# Patient Record
Sex: Male | Born: 1950
Health system: Southern US, Community
[De-identification: ages and names within clinical notes are randomized; demographics above are authoritative.]

## PROBLEM LIST (undated history)

## (undated) DIAGNOSIS — I10 Essential (primary) hypertension: Secondary | ICD-10-CM

## (undated) DIAGNOSIS — IMO0002 Reserved for concepts with insufficient information to code with codable children: Secondary | ICD-10-CM

## (undated) DIAGNOSIS — T8859XA Other complications of anesthesia, initial encounter: Secondary | ICD-10-CM

## (undated) DIAGNOSIS — T4145XA Adverse effect of unspecified anesthetic, initial encounter: Secondary | ICD-10-CM

## (undated) DIAGNOSIS — B20 Human immunodeficiency virus [HIV] disease: Secondary | ICD-10-CM

## (undated) DIAGNOSIS — M109 Gout, unspecified: Secondary | ICD-10-CM

## (undated) DIAGNOSIS — Z862 Personal history of diseases of the blood and blood-forming organs and certain disorders involving the immune mechanism: Secondary | ICD-10-CM

## (undated) DIAGNOSIS — E1165 Type 2 diabetes mellitus with hyperglycemia: Secondary | ICD-10-CM

## (undated) HISTORY — DX: Other disorders of bilirubin metabolism: E80.6

## (undated) HISTORY — PX: OTHER SURGICAL HISTORY: SHX169

## (undated) HISTORY — PX: ADRENALECTOMY: SHX876

## (undated) HISTORY — DX: Type 2 diabetes mellitus with hyperglycemia: E11.65

## (undated) HISTORY — DX: Essential (primary) hypertension: I10

## (undated) HISTORY — DX: Human immunodeficiency virus (HIV) disease: B20

## (undated) HISTORY — DX: Gout, unspecified: M10.9

## (undated) HISTORY — DX: Personal history of diseases of the blood and blood-forming organs and certain disorders involving the immune mechanism: Z86.2

## (undated) HISTORY — PX: CHOLECYSTECTOMY: SHX55

---

## 1988-03-10 DIAGNOSIS — B20 Human immunodeficiency virus [HIV] disease: Secondary | ICD-10-CM

## 1988-03-10 HISTORY — DX: Human immunodeficiency virus (HIV) disease: B20

## 1989-02-07 ENCOUNTER — Encounter (INDEPENDENT_AMBULATORY_CARE_PROVIDER_SITE_OTHER): Payer: Self-pay | Admitting: *Deleted

## 1989-02-07 LAB — CONVERTED CEMR LAB
CD4 Count: 530 microliters
CD4 T Cell Abs: 530

## 1997-06-20 ENCOUNTER — Encounter: Admission: RE | Admit: 1997-06-20 | Discharge: 1997-06-20 | Payer: Self-pay | Admitting: Infectious Diseases

## 1997-07-12 ENCOUNTER — Encounter: Admission: RE | Admit: 1997-07-12 | Discharge: 1997-07-12 | Payer: Self-pay | Admitting: Infectious Diseases

## 1998-03-12 ENCOUNTER — Encounter: Admission: RE | Admit: 1998-03-12 | Discharge: 1998-03-12 | Payer: Self-pay | Admitting: Infectious Diseases

## 1998-04-16 ENCOUNTER — Encounter: Admission: RE | Admit: 1998-04-16 | Discharge: 1998-04-16 | Payer: Self-pay | Admitting: Infectious Diseases

## 1998-07-18 ENCOUNTER — Encounter: Admission: RE | Admit: 1998-07-18 | Discharge: 1998-07-18 | Payer: Self-pay | Admitting: Infectious Diseases

## 1998-07-18 ENCOUNTER — Ambulatory Visit (HOSPITAL_COMMUNITY): Admission: RE | Admit: 1998-07-18 | Discharge: 1998-07-18 | Payer: Self-pay | Admitting: Infectious Diseases

## 1998-08-06 ENCOUNTER — Encounter: Admission: RE | Admit: 1998-08-06 | Discharge: 1998-08-06 | Payer: Self-pay | Admitting: Infectious Diseases

## 1998-08-10 ENCOUNTER — Encounter (HOSPITAL_COMMUNITY): Admission: RE | Admit: 1998-08-10 | Discharge: 1998-11-08 | Payer: Self-pay | Admitting: Gastroenterology

## 1998-10-12 ENCOUNTER — Ambulatory Visit (HOSPITAL_COMMUNITY): Admission: RE | Admit: 1998-10-12 | Discharge: 1998-10-12 | Payer: Self-pay | Admitting: Infectious Diseases

## 1998-10-12 ENCOUNTER — Encounter: Admission: RE | Admit: 1998-10-12 | Discharge: 1998-10-12 | Payer: Self-pay | Admitting: Infectious Diseases

## 1998-10-29 ENCOUNTER — Encounter: Admission: RE | Admit: 1998-10-29 | Discharge: 1998-10-29 | Payer: Self-pay | Admitting: Infectious Diseases

## 1998-11-08 ENCOUNTER — Encounter (HOSPITAL_COMMUNITY): Admission: RE | Admit: 1998-11-08 | Discharge: 1999-02-06 | Payer: Self-pay | Admitting: Gastroenterology

## 1999-02-27 ENCOUNTER — Ambulatory Visit (HOSPITAL_COMMUNITY): Admission: RE | Admit: 1999-02-27 | Discharge: 1999-02-27 | Payer: Self-pay | Admitting: Infectious Diseases

## 1999-02-27 ENCOUNTER — Encounter: Admission: RE | Admit: 1999-02-27 | Discharge: 1999-02-27 | Payer: Self-pay | Admitting: Infectious Diseases

## 1999-03-18 ENCOUNTER — Encounter: Admission: RE | Admit: 1999-03-18 | Discharge: 1999-03-18 | Payer: Self-pay | Admitting: Infectious Diseases

## 1999-06-11 ENCOUNTER — Ambulatory Visit (HOSPITAL_COMMUNITY): Admission: RE | Admit: 1999-06-11 | Discharge: 1999-06-11 | Payer: Self-pay | Admitting: Gastroenterology

## 1999-08-16 ENCOUNTER — Ambulatory Visit (HOSPITAL_COMMUNITY): Admission: RE | Admit: 1999-08-16 | Discharge: 1999-08-16 | Payer: Self-pay | Admitting: Infectious Diseases

## 1999-08-16 ENCOUNTER — Encounter (HOSPITAL_COMMUNITY): Admission: RE | Admit: 1999-08-16 | Discharge: 1999-11-14 | Payer: Self-pay | Admitting: Gastroenterology

## 1999-09-30 ENCOUNTER — Encounter: Admission: RE | Admit: 1999-09-30 | Discharge: 1999-09-30 | Payer: Self-pay | Admitting: Infectious Diseases

## 2000-01-17 ENCOUNTER — Encounter (HOSPITAL_COMMUNITY): Admission: RE | Admit: 2000-01-17 | Discharge: 2000-04-16 | Payer: Self-pay | Admitting: Gastroenterology

## 2000-01-24 ENCOUNTER — Emergency Department (HOSPITAL_COMMUNITY): Admission: EM | Admit: 2000-01-24 | Discharge: 2000-01-24 | Payer: Self-pay | Admitting: Emergency Medicine

## 2000-03-30 ENCOUNTER — Encounter: Admission: RE | Admit: 2000-03-30 | Discharge: 2000-03-30 | Payer: Self-pay | Admitting: Infectious Diseases

## 2000-03-30 ENCOUNTER — Ambulatory Visit (HOSPITAL_COMMUNITY): Admission: RE | Admit: 2000-03-30 | Discharge: 2000-03-30 | Payer: Self-pay | Admitting: Infectious Diseases

## 2000-04-17 ENCOUNTER — Encounter (HOSPITAL_COMMUNITY): Admission: RE | Admit: 2000-04-17 | Discharge: 2000-07-16 | Payer: Self-pay | Admitting: Gastroenterology

## 2000-04-20 ENCOUNTER — Encounter: Admission: RE | Admit: 2000-04-20 | Discharge: 2000-04-20 | Payer: Self-pay | Admitting: Infectious Diseases

## 2000-08-28 ENCOUNTER — Encounter (HOSPITAL_COMMUNITY): Admission: RE | Admit: 2000-08-28 | Discharge: 2000-11-26 | Payer: Self-pay | Admitting: Gastroenterology

## 2001-01-27 ENCOUNTER — Encounter (HOSPITAL_COMMUNITY): Admission: RE | Admit: 2001-01-27 | Discharge: 2001-04-27 | Payer: Self-pay | Admitting: Gastroenterology

## 2001-01-27 ENCOUNTER — Ambulatory Visit (HOSPITAL_COMMUNITY): Admission: RE | Admit: 2001-01-27 | Discharge: 2001-01-27 | Payer: Self-pay | Admitting: Infectious Diseases

## 2001-01-27 ENCOUNTER — Encounter: Admission: RE | Admit: 2001-01-27 | Discharge: 2001-01-27 | Payer: Self-pay | Admitting: Infectious Diseases

## 2001-02-22 ENCOUNTER — Encounter: Admission: RE | Admit: 2001-02-22 | Discharge: 2001-02-22 | Payer: Self-pay | Admitting: Infectious Diseases

## 2001-03-22 ENCOUNTER — Encounter: Admission: RE | Admit: 2001-03-22 | Discharge: 2001-03-22 | Payer: Self-pay | Admitting: Internal Medicine

## 2001-04-27 ENCOUNTER — Ambulatory Visit (HOSPITAL_COMMUNITY): Admission: RE | Admit: 2001-04-27 | Discharge: 2001-04-27 | Payer: Self-pay | Admitting: Infectious Diseases

## 2001-04-27 ENCOUNTER — Encounter: Admission: RE | Admit: 2001-04-27 | Discharge: 2001-04-27 | Payer: Self-pay | Admitting: Internal Medicine

## 2001-05-11 ENCOUNTER — Encounter: Admission: RE | Admit: 2001-05-11 | Discharge: 2001-05-11 | Payer: Self-pay | Admitting: Infectious Diseases

## 2001-07-02 ENCOUNTER — Encounter (HOSPITAL_COMMUNITY): Admission: RE | Admit: 2001-07-02 | Discharge: 2001-09-30 | Payer: Self-pay | Admitting: Gastroenterology

## 2001-10-08 ENCOUNTER — Encounter (HOSPITAL_COMMUNITY): Admission: RE | Admit: 2001-10-08 | Discharge: 2002-01-06 | Payer: Self-pay | Admitting: Gastroenterology

## 2001-11-01 ENCOUNTER — Encounter: Admission: RE | Admit: 2001-11-01 | Discharge: 2001-11-01 | Payer: Self-pay | Admitting: Infectious Diseases

## 2001-11-01 ENCOUNTER — Ambulatory Visit (HOSPITAL_COMMUNITY): Admission: RE | Admit: 2001-11-01 | Discharge: 2001-11-01 | Payer: Self-pay | Admitting: Infectious Diseases

## 2001-12-13 ENCOUNTER — Encounter: Admission: RE | Admit: 2001-12-13 | Discharge: 2001-12-13 | Payer: Self-pay | Admitting: Infectious Diseases

## 2002-01-07 ENCOUNTER — Encounter (HOSPITAL_COMMUNITY): Admission: RE | Admit: 2002-01-07 | Discharge: 2002-04-07 | Payer: Self-pay | Admitting: Gastroenterology

## 2002-03-10 LAB — HM COLONOSCOPY: HM Colonoscopy: NORMAL

## 2002-03-24 ENCOUNTER — Ambulatory Visit (HOSPITAL_COMMUNITY): Admission: RE | Admit: 2002-03-24 | Discharge: 2002-03-24 | Payer: Self-pay | Admitting: Infectious Diseases

## 2002-03-24 ENCOUNTER — Encounter: Admission: RE | Admit: 2002-03-24 | Discharge: 2002-03-24 | Payer: Self-pay | Admitting: Internal Medicine

## 2002-04-11 ENCOUNTER — Encounter: Admission: RE | Admit: 2002-04-11 | Discharge: 2002-04-11 | Payer: Self-pay | Admitting: Infectious Diseases

## 2002-04-11 ENCOUNTER — Ambulatory Visit (HOSPITAL_COMMUNITY): Admission: RE | Admit: 2002-04-11 | Discharge: 2002-04-11 | Payer: Self-pay | Admitting: Infectious Diseases

## 2002-05-09 ENCOUNTER — Encounter: Admission: RE | Admit: 2002-05-09 | Discharge: 2002-05-09 | Payer: Self-pay | Admitting: Infectious Diseases

## 2002-05-13 ENCOUNTER — Encounter (HOSPITAL_COMMUNITY): Admission: RE | Admit: 2002-05-13 | Discharge: 2002-08-11 | Payer: Self-pay | Admitting: Gastroenterology

## 2002-07-20 ENCOUNTER — Encounter: Payer: Self-pay | Admitting: Emergency Medicine

## 2002-07-20 ENCOUNTER — Emergency Department (HOSPITAL_COMMUNITY): Admission: EM | Admit: 2002-07-20 | Discharge: 2002-07-20 | Payer: Self-pay | Admitting: Emergency Medicine

## 2002-08-08 ENCOUNTER — Encounter: Admission: RE | Admit: 2002-08-08 | Discharge: 2002-08-08 | Payer: Self-pay | Admitting: Infectious Diseases

## 2002-08-29 ENCOUNTER — Encounter: Admission: RE | Admit: 2002-08-29 | Discharge: 2002-08-29 | Payer: Self-pay | Admitting: Infectious Diseases

## 2002-09-02 ENCOUNTER — Encounter (HOSPITAL_COMMUNITY): Admission: RE | Admit: 2002-09-02 | Discharge: 2002-12-01 | Payer: Self-pay | Admitting: Gastroenterology

## 2002-11-29 ENCOUNTER — Encounter: Admission: RE | Admit: 2002-11-29 | Discharge: 2002-11-29 | Payer: Self-pay | Admitting: Infectious Diseases

## 2002-11-29 ENCOUNTER — Ambulatory Visit (HOSPITAL_COMMUNITY): Admission: RE | Admit: 2002-11-29 | Discharge: 2002-11-29 | Payer: Self-pay | Admitting: Infectious Diseases

## 2002-11-29 ENCOUNTER — Encounter (INDEPENDENT_AMBULATORY_CARE_PROVIDER_SITE_OTHER): Payer: Self-pay | Admitting: Infectious Diseases

## 2002-12-02 ENCOUNTER — Encounter (HOSPITAL_COMMUNITY): Admission: RE | Admit: 2002-12-02 | Discharge: 2003-03-02 | Payer: Self-pay | Admitting: Gastroenterology

## 2002-12-12 ENCOUNTER — Encounter: Admission: RE | Admit: 2002-12-12 | Discharge: 2002-12-12 | Payer: Self-pay | Admitting: Infectious Diseases

## 2003-05-04 ENCOUNTER — Encounter: Admission: RE | Admit: 2003-05-04 | Discharge: 2003-05-04 | Payer: Self-pay | Admitting: Internal Medicine

## 2003-05-04 ENCOUNTER — Ambulatory Visit (HOSPITAL_COMMUNITY): Admission: RE | Admit: 2003-05-04 | Discharge: 2003-05-04 | Payer: Self-pay | Admitting: Infectious Diseases

## 2003-05-05 ENCOUNTER — Encounter (HOSPITAL_COMMUNITY): Admission: RE | Admit: 2003-05-05 | Discharge: 2003-08-03 | Payer: Self-pay | Admitting: Gastroenterology

## 2003-05-29 ENCOUNTER — Encounter: Admission: RE | Admit: 2003-05-29 | Discharge: 2003-05-29 | Payer: Self-pay | Admitting: Infectious Diseases

## 2003-09-08 ENCOUNTER — Encounter (HOSPITAL_COMMUNITY): Admission: RE | Admit: 2003-09-08 | Discharge: 2003-12-07 | Payer: Self-pay | Admitting: Gastroenterology

## 2003-09-13 ENCOUNTER — Encounter: Admission: RE | Admit: 2003-09-13 | Discharge: 2003-09-13 | Payer: Self-pay | Admitting: Infectious Diseases

## 2003-09-13 ENCOUNTER — Ambulatory Visit (HOSPITAL_COMMUNITY): Admission: RE | Admit: 2003-09-13 | Discharge: 2003-09-13 | Payer: Self-pay | Admitting: Infectious Diseases

## 2003-09-28 ENCOUNTER — Encounter: Admission: RE | Admit: 2003-09-28 | Discharge: 2003-09-28 | Payer: Self-pay | Admitting: Infectious Diseases

## 2003-12-08 ENCOUNTER — Ambulatory Visit (HOSPITAL_COMMUNITY): Admission: RE | Admit: 2003-12-08 | Discharge: 2003-12-08 | Payer: Self-pay | Admitting: Gastroenterology

## 2004-02-29 ENCOUNTER — Encounter (HOSPITAL_COMMUNITY): Admission: RE | Admit: 2004-02-29 | Discharge: 2004-03-18 | Payer: Self-pay | Admitting: Gastroenterology

## 2004-04-08 ENCOUNTER — Ambulatory Visit: Payer: Self-pay | Admitting: Infectious Diseases

## 2004-04-08 ENCOUNTER — Ambulatory Visit (HOSPITAL_COMMUNITY): Admission: RE | Admit: 2004-04-08 | Discharge: 2004-04-08 | Payer: Self-pay | Admitting: Infectious Diseases

## 2004-04-29 ENCOUNTER — Ambulatory Visit: Payer: Self-pay | Admitting: Infectious Diseases

## 2004-06-10 ENCOUNTER — Encounter (HOSPITAL_COMMUNITY): Admission: RE | Admit: 2004-06-10 | Discharge: 2004-09-08 | Payer: Self-pay | Admitting: Gastroenterology

## 2004-08-08 ENCOUNTER — Ambulatory Visit: Payer: Self-pay | Admitting: Infectious Diseases

## 2004-08-08 ENCOUNTER — Ambulatory Visit (HOSPITAL_COMMUNITY): Admission: RE | Admit: 2004-08-08 | Discharge: 2004-08-08 | Payer: Self-pay | Admitting: Infectious Diseases

## 2004-08-08 ENCOUNTER — Encounter (INDEPENDENT_AMBULATORY_CARE_PROVIDER_SITE_OTHER): Payer: Self-pay | Admitting: *Deleted

## 2004-08-08 LAB — CONVERTED CEMR LAB
CD4 Count: 280 microliters
HIV 1 RNA Quant: 49 copies/mL

## 2004-10-07 ENCOUNTER — Ambulatory Visit: Payer: Self-pay | Admitting: Infectious Diseases

## 2004-10-18 ENCOUNTER — Encounter (HOSPITAL_COMMUNITY): Admission: RE | Admit: 2004-10-18 | Discharge: 2005-01-16 | Payer: Self-pay | Admitting: Gastroenterology

## 2005-02-10 ENCOUNTER — Encounter (HOSPITAL_COMMUNITY): Admission: RE | Admit: 2005-02-10 | Discharge: 2005-05-11 | Payer: Self-pay | Admitting: Gastroenterology

## 2005-05-05 ENCOUNTER — Encounter (INDEPENDENT_AMBULATORY_CARE_PROVIDER_SITE_OTHER): Payer: Self-pay | Admitting: *Deleted

## 2005-05-05 ENCOUNTER — Ambulatory Visit: Payer: Self-pay | Admitting: Infectious Diseases

## 2005-05-05 LAB — CONVERTED CEMR LAB
CD4 Count: 320 microliters
HIV 1 RNA Quant: 49 copies/mL

## 2005-05-19 ENCOUNTER — Ambulatory Visit: Payer: Self-pay | Admitting: Infectious Diseases

## 2005-07-16 ENCOUNTER — Encounter (HOSPITAL_COMMUNITY): Admission: RE | Admit: 2005-07-16 | Discharge: 2005-10-14 | Payer: Self-pay | Admitting: Gastroenterology

## 2005-11-24 ENCOUNTER — Encounter (INDEPENDENT_AMBULATORY_CARE_PROVIDER_SITE_OTHER): Payer: Self-pay | Admitting: *Deleted

## 2005-11-24 ENCOUNTER — Ambulatory Visit: Payer: Self-pay | Admitting: Infectious Diseases

## 2005-11-24 ENCOUNTER — Encounter (HOSPITAL_COMMUNITY): Admission: RE | Admit: 2005-11-24 | Discharge: 2006-02-22 | Payer: Self-pay | Admitting: Gastroenterology

## 2005-11-24 ENCOUNTER — Encounter: Admission: RE | Admit: 2005-11-24 | Discharge: 2005-11-24 | Payer: Self-pay | Admitting: Infectious Diseases

## 2005-11-24 LAB — CONVERTED CEMR LAB
CD4 Count: 300 microliters
HIV 1 RNA Quant: 49 copies/mL

## 2005-12-15 ENCOUNTER — Ambulatory Visit: Payer: Self-pay | Admitting: Infectious Diseases

## 2005-12-15 DIAGNOSIS — Z862 Personal history of diseases of the blood and blood-forming organs and certain disorders involving the immune mechanism: Secondary | ICD-10-CM | POA: Insufficient documentation

## 2005-12-15 DIAGNOSIS — I10 Essential (primary) hypertension: Secondary | ICD-10-CM | POA: Insufficient documentation

## 2005-12-15 DIAGNOSIS — B20 Human immunodeficiency virus [HIV] disease: Secondary | ICD-10-CM | POA: Insufficient documentation

## 2005-12-15 HISTORY — DX: Personal history of diseases of the blood and blood-forming organs and certain disorders involving the immune mechanism: Z86.2

## 2006-04-20 ENCOUNTER — Encounter (INDEPENDENT_AMBULATORY_CARE_PROVIDER_SITE_OTHER): Payer: Self-pay | Admitting: Infectious Diseases

## 2006-05-04 ENCOUNTER — Encounter (INDEPENDENT_AMBULATORY_CARE_PROVIDER_SITE_OTHER): Payer: Self-pay | Admitting: *Deleted

## 2006-05-04 LAB — CONVERTED CEMR LAB

## 2006-05-17 ENCOUNTER — Encounter (INDEPENDENT_AMBULATORY_CARE_PROVIDER_SITE_OTHER): Payer: Self-pay | Admitting: *Deleted

## 2006-05-18 ENCOUNTER — Ambulatory Visit (HOSPITAL_COMMUNITY): Admission: RE | Admit: 2006-05-18 | Discharge: 2006-05-18 | Payer: Self-pay | Admitting: Gastroenterology

## 2006-05-18 ENCOUNTER — Encounter (HOSPITAL_COMMUNITY): Admission: RE | Admit: 2006-05-18 | Discharge: 2006-08-16 | Payer: Self-pay | Admitting: Gastroenterology

## 2006-05-20 ENCOUNTER — Telehealth (INDEPENDENT_AMBULATORY_CARE_PROVIDER_SITE_OTHER): Payer: Self-pay | Admitting: Infectious Diseases

## 2006-06-22 ENCOUNTER — Ambulatory Visit: Payer: Self-pay | Admitting: Internal Medicine

## 2006-06-22 ENCOUNTER — Encounter: Payer: Self-pay | Admitting: Internal Medicine

## 2006-06-22 DIAGNOSIS — M109 Gout, unspecified: Secondary | ICD-10-CM

## 2006-06-22 DIAGNOSIS — E118 Type 2 diabetes mellitus with unspecified complications: Secondary | ICD-10-CM | POA: Insufficient documentation

## 2006-06-22 DIAGNOSIS — IMO0001 Reserved for inherently not codable concepts without codable children: Secondary | ICD-10-CM

## 2006-06-22 HISTORY — DX: Reserved for inherently not codable concepts without codable children: IMO0001

## 2006-06-22 HISTORY — DX: Gout, unspecified: M10.9

## 2006-06-22 LAB — CONVERTED CEMR LAB
ALT: 64 units/L — ABNORMAL HIGH (ref 0–40)
AST: 63 units/L — ABNORMAL HIGH (ref 0–37)
Albumin: 4.1 g/dL (ref 3.5–5.2)
Alkaline Phosphatase: 144 units/L — ABNORMAL HIGH (ref 39–117)
BUN: 9 mg/dL (ref 6–23)
Bilirubin, Direct: 0.2 mg/dL (ref 0.0–0.3)
CO2: 25 meq/L (ref 19–32)
Calcium: 9.3 mg/dL (ref 8.4–10.5)
Chloride: 110 meq/L (ref 96–112)
Cholesterol: 151 mg/dL (ref 0–200)
Creatinine, Ser: 0.8 mg/dL (ref 0.4–1.5)
Creatinine,U: 63.5 mg/dL
Direct LDL: 71.9 mg/dL
GFR calc Af Amer: 130 mL/min
GFR calc non Af Amer: 107 mL/min
Glucose, Bld: 150 mg/dL — ABNORMAL HIGH (ref 70–99)
HDL: 32.9 mg/dL — ABNORMAL LOW (ref >39.0)
Hgb A1c MFr Bld: 7.5 % — ABNORMAL HIGH (ref 4.6–6.0)
Microalb Creat Ratio: 141.7 mg/g — ABNORMAL HIGH (ref 0.0–30.0)
Microalb, Ur: 9 mg/dL — ABNORMAL HIGH (ref 0.0–1.9)
Potassium: 3.9 meq/L (ref 3.5–5.1)
Sodium: 143 meq/L (ref 135–145)
Total Bilirubin: 1.3 mg/dL — ABNORMAL HIGH (ref 0.3–1.2)
Total CHOL/HDL Ratio: 4.6
Total Protein: 7.7 g/dL (ref 6.0–8.3)
Triglycerides: 388 mg/dL (ref 0–149)
VLDL: 78 mg/dL — ABNORMAL HIGH (ref 0–40)

## 2006-07-13 ENCOUNTER — Encounter: Admission: RE | Admit: 2006-07-13 | Discharge: 2006-10-11 | Payer: Self-pay | Admitting: Internal Medicine

## 2006-07-13 ENCOUNTER — Encounter (INDEPENDENT_AMBULATORY_CARE_PROVIDER_SITE_OTHER): Payer: Self-pay | Admitting: Infectious Diseases

## 2006-07-27 ENCOUNTER — Encounter (INDEPENDENT_AMBULATORY_CARE_PROVIDER_SITE_OTHER): Payer: Self-pay | Admitting: Infectious Diseases

## 2006-08-24 ENCOUNTER — Encounter (HOSPITAL_COMMUNITY): Admission: RE | Admit: 2006-08-24 | Discharge: 2006-10-19 | Payer: Self-pay | Admitting: Gastroenterology

## 2006-08-24 ENCOUNTER — Encounter: Admission: RE | Admit: 2006-08-24 | Discharge: 2006-08-24 | Payer: Self-pay | Admitting: Infectious Diseases

## 2006-08-24 ENCOUNTER — Ambulatory Visit: Payer: Self-pay | Admitting: Infectious Diseases

## 2006-08-24 LAB — CONVERTED CEMR LAB
ALT: 23 units/L (ref 0–53)
AST: 24 units/L (ref 0–37)
Albumin: 4.8 g/dL (ref 3.5–5.2)
Alkaline Phosphatase: 126 units/L — ABNORMAL HIGH (ref 39–117)
BUN: 19 mg/dL (ref 6–23)
Basophils Absolute: 0 10*3/uL (ref 0.0–0.1)
Basophils Relative: 1 % (ref 0–1)
Bilirubin Urine: NEGATIVE
CD4 Count: 380 microliters
CO2: 24 meq/L (ref 19–32)
Calcium: 9.8 mg/dL (ref 8.4–10.5)
Chloride: 106 meq/L (ref 96–112)
Cholesterol: 114 mg/dL (ref 0–200)
Creatinine, Ser: 1.06 mg/dL (ref 0.40–1.50)
Eosinophils Absolute: 0.1 10*3/uL (ref 0.0–0.7)
Eosinophils Relative: 2 % (ref 0–5)
Glucose, Bld: 101 mg/dL — ABNORMAL HIGH (ref 70–99)
HCT: 44.4 % (ref 39.0–52.0)
HDL: 33 mg/dL — ABNORMAL LOW (ref >39)
HIV 1 RNA Quant: <50 copies/mL (ref <50)
HIV-1 RNA Quant, Log: <1.7 (ref <1.70)
Hemoglobin, Urine: NEGATIVE
Hemoglobin: 14.8 g/dL (ref 13.0–17.0)
Ketones, ur: NEGATIVE mg/dL
LDL Cholesterol: 52 mg/dL (ref 0–99)
Leukocytes, UA: NEGATIVE
Lymphocytes Relative: 34 % (ref 12–46)
Lymphs Abs: 1.7 10*3/uL (ref 0.7–3.3)
MCHC: 33.3 g/dL (ref 30.0–36.0)
MCV: 98.9 fL (ref 78.0–100.0)
Monocytes Absolute: 0.5 10*3/uL (ref 0.2–0.7)
Monocytes Relative: 9 % (ref 3–11)
Neutro Abs: 2.7 10*3/uL (ref 1.7–7.7)
Neutrophils Relative %: 54 % (ref 43–77)
Nitrite: NEGATIVE
Platelets: 258 10*3/uL (ref 150–400)
Potassium: 4.5 meq/L (ref 3.5–5.3)
Protein, ur: NEGATIVE mg/dL
RBC: 4.49 M/uL (ref 4.22–5.81)
RDW: 13.9 % (ref 11.5–14.0)
Sodium: 142 meq/L (ref 135–145)
Specific Gravity, Urine: 1.025 (ref 1.005–1.03)
Total Bilirubin: 1.1 mg/dL (ref 0.3–1.2)
Total CHOL/HDL Ratio: 3.5
Total Protein: 8 g/dL (ref 6.0–8.3)
Triglycerides: 147 mg/dL (ref <150)
Urine Glucose: NEGATIVE mg/dL
Urobilinogen, UA: 0.2 (ref 0.0–1.0)
VLDL: 29 mg/dL (ref 0–40)
WBC: 5 10*3/uL (ref 4.0–10.5)
pH: 6 (ref 5.0–8.0)

## 2006-08-31 ENCOUNTER — Encounter (INDEPENDENT_AMBULATORY_CARE_PROVIDER_SITE_OTHER): Payer: Self-pay | Admitting: *Deleted

## 2006-09-07 ENCOUNTER — Ambulatory Visit: Payer: Self-pay | Admitting: Infectious Diseases

## 2006-10-26 ENCOUNTER — Ambulatory Visit: Payer: Self-pay | Admitting: Internal Medicine

## 2006-11-02 ENCOUNTER — Encounter: Payer: Self-pay | Admitting: Infectious Diseases

## 2007-01-04 ENCOUNTER — Ambulatory Visit: Payer: Self-pay | Admitting: Internal Medicine

## 2007-01-04 LAB — CONVERTED CEMR LAB
ALT: 25 units/L (ref 0–53)
AST: 27 units/L (ref 0–37)
Albumin: 4.1 g/dL (ref 3.5–5.2)
Alkaline Phosphatase: 109 units/L (ref 39–117)
BUN: 12 mg/dL (ref 6–23)
Basophils Absolute: 0 10*3/uL (ref 0.0–0.1)
Basophils Relative: 0.4 % (ref 0.0–1.0)
Bilirubin, Direct: 0.2 mg/dL (ref 0.0–0.3)
Blood in Urine, dipstick: NEGATIVE
CO2: 28 meq/L (ref 19–32)
Calcium: 9.5 mg/dL (ref 8.4–10.5)
Chloride: 109 meq/L (ref 96–112)
Cholesterol: 139 mg/dL (ref 0–200)
Creatinine, Ser: 1 mg/dL (ref 0.4–1.5)
Creatinine,U: 199.9 mg/dL
Eosinophils Absolute: 0.2 10*3/uL (ref 0.0–0.6)
Eosinophils Relative: 3.4 % (ref 0.0–5.0)
GFR calc Af Amer: 100 mL/min
GFR calc non Af Amer: 83 mL/min
Glucose, Bld: 104 mg/dL — ABNORMAL HIGH (ref 70–99)
Glucose, Urine, Semiquant: NEGATIVE
HCT: 40.9 % (ref 39.0–52.0)
HDL: 27.7 mg/dL — ABNORMAL LOW (ref >39.0)
Hemoglobin: 14.6 g/dL (ref 13.0–17.0)
Hgb A1c MFr Bld: 5.3 % (ref 4.6–6.0)
Ketones, urine, test strip: NEGATIVE
LDL Cholesterol: 83 mg/dL (ref 0–99)
Lymphocytes Relative: 30.9 % (ref 12.0–46.0)
MCHC: 35.8 g/dL (ref 30.0–36.0)
MCV: 93.1 fL (ref 78.0–100.0)
Microalb Creat Ratio: 11 mg/g (ref 0.0–30.0)
Microalb, Ur: 2.2 mg/dL — ABNORMAL HIGH (ref 0.0–1.9)
Monocytes Absolute: 0.4 10*3/uL (ref 0.2–0.7)
Monocytes Relative: 8.5 % (ref 3.0–11.0)
Neutro Abs: 2.6 10*3/uL (ref 1.4–7.7)
Neutrophils Relative %: 56.8 % (ref 43.0–77.0)
Nitrite: NEGATIVE
PSA: 0.17 ng/mL (ref 0.10–4.00)
Platelets: 205 10*3/uL (ref 150–400)
Potassium: 5.3 meq/L — ABNORMAL HIGH (ref 3.5–5.1)
RBC: 4.39 M/uL (ref 4.22–5.81)
RDW: 14.5 % (ref 11.5–14.6)
Sodium: 143 meq/L (ref 135–145)
Specific Gravity, Urine: 1.02
TSH: 1.2 microintl units/mL (ref 0.35–5.50)
Total Bilirubin: 1 mg/dL (ref 0.3–1.2)
Total CHOL/HDL Ratio: 5
Total Protein: 7.1 g/dL (ref 6.0–8.3)
Triglycerides: 144 mg/dL (ref 0–149)
Urobilinogen, UA: 0.2
VLDL: 29 mg/dL (ref 0–40)
WBC Urine, dipstick: NEGATIVE
WBC: 4.7 10*3/uL (ref 4.5–10.5)
pH: 5.5

## 2007-01-18 ENCOUNTER — Ambulatory Visit: Payer: Self-pay | Admitting: Internal Medicine

## 2007-02-08 ENCOUNTER — Encounter: Admission: RE | Admit: 2007-02-08 | Discharge: 2007-02-08 | Payer: Self-pay | Admitting: Infectious Diseases

## 2007-02-08 ENCOUNTER — Encounter: Payer: Self-pay | Admitting: Infectious Diseases

## 2007-02-08 ENCOUNTER — Ambulatory Visit: Payer: Self-pay | Admitting: Infectious Diseases

## 2007-02-08 LAB — CONVERTED CEMR LAB
ALT: 19 units/L (ref 0–53)
AST: 21 units/L (ref 0–37)
Albumin: 4.5 g/dL (ref 3.5–5.2)
Alkaline Phosphatase: 135 units/L — ABNORMAL HIGH (ref 39–117)
BUN: 14 mg/dL (ref 6–23)
Basophils Absolute: 0 10*3/uL (ref 0.0–0.1)
Basophils Relative: 0 % (ref 0–1)
CO2: 22 meq/L (ref 19–32)
Calcium: 9.6 mg/dL (ref 8.4–10.5)
Chloride: 106 meq/L (ref 96–112)
Creatinine, Ser: 0.89 mg/dL (ref 0.40–1.50)
Eosinophils Absolute: 0.1 10*3/uL — ABNORMAL LOW (ref 0.2–0.7)
Eosinophils Relative: 3 % (ref 0–5)
Glucose, Bld: 108 mg/dL — ABNORMAL HIGH (ref 70–99)
HCT: 41.8 % (ref 39.0–52.0)
HIV 1 RNA Quant: <50 copies/mL (ref <50)
HIV-1 RNA Quant, Log: <1.7 (ref <1.70)
Hemoglobin: 14.8 g/dL (ref 13.0–17.0)
Lymphocytes Relative: 25 % (ref 12–46)
Lymphs Abs: 1.3 10*3/uL (ref 0.7–4.0)
MCHC: 35.4 g/dL (ref 30.0–36.0)
MCV: 93.5 fL (ref 78.0–100.0)
Monocytes Absolute: 0.7 10*3/uL (ref 0.1–1.0)
Monocytes Relative: 13 % — ABNORMAL HIGH (ref 3–12)
Neutro Abs: 3 10*3/uL (ref 1.7–7.7)
Neutrophils Relative %: 59 % (ref 43–77)
Platelets: 198 10*3/uL (ref 150–400)
Potassium: 4 meq/L (ref 3.5–5.3)
RBC: 4.47 M/uL (ref 4.22–5.81)
RDW: 14.6 % (ref 11.5–15.5)
Sodium: 143 meq/L (ref 135–145)
Total Bilirubin: 1.1 mg/dL (ref 0.3–1.2)
Total Protein: 7.6 g/dL (ref 6.0–8.3)
WBC: 5.2 10*3/uL (ref 4.0–10.5)

## 2007-02-25 ENCOUNTER — Ambulatory Visit: Payer: Self-pay | Admitting: Infectious Diseases

## 2007-02-25 HISTORY — DX: Other disorders of bilirubin metabolism: E80.6

## 2007-03-03 ENCOUNTER — Encounter: Payer: Self-pay | Admitting: Internal Medicine

## 2007-03-09 ENCOUNTER — Encounter: Payer: Self-pay | Admitting: Infectious Diseases

## 2007-05-03 ENCOUNTER — Encounter: Payer: Self-pay | Admitting: Infectious Diseases

## 2007-05-03 ENCOUNTER — Encounter: Payer: Self-pay | Admitting: Internal Medicine

## 2007-08-20 ENCOUNTER — Encounter (HOSPITAL_COMMUNITY): Admission: RE | Admit: 2007-08-20 | Discharge: 2007-10-18 | Payer: Self-pay | Admitting: Gastroenterology

## 2007-10-22 ENCOUNTER — Ambulatory Visit (HOSPITAL_COMMUNITY): Admission: RE | Admit: 2007-10-22 | Discharge: 2007-10-22 | Payer: Self-pay | Admitting: Gastroenterology

## 2007-11-23 ENCOUNTER — Ambulatory Visit: Payer: Self-pay | Admitting: Internal Medicine

## 2007-11-23 ENCOUNTER — Encounter: Payer: Self-pay | Admitting: Infectious Diseases

## 2007-11-23 LAB — CONVERTED CEMR LAB
ALT: 24 units/L (ref 0–53)
AST: 22 units/L (ref 0–37)
Albumin: 4.6 g/dL (ref 3.5–5.2)
Alkaline Phosphatase: 123 units/L — ABNORMAL HIGH (ref 39–117)
BUN: 17 mg/dL (ref 6–23)
Basophils Absolute: 0 10*3/uL (ref 0.0–0.1)
Basophils Relative: 1 % (ref 0–1)
CO2: 23 meq/L (ref 19–32)
Calcium: 9.7 mg/dL (ref 8.4–10.5)
Chloride: 103 meq/L (ref 96–112)
Creatinine, Ser: 1.1 mg/dL (ref 0.40–1.50)
Eosinophils Absolute: 0.2 10*3/uL (ref 0.0–0.7)
Eosinophils Relative: 3 % (ref 0–5)
Glucose, Bld: 129 mg/dL — ABNORMAL HIGH (ref 70–99)
HCT: 45.1 % (ref 39.0–52.0)
HIV 1 RNA Quant: <50 copies/mL (ref <50)
HIV-1 RNA Quant, Log: <1.7 (ref <1.70)
Hemoglobin: 15.3 g/dL (ref 13.0–17.0)
Lymphocytes Relative: 28 % (ref 12–46)
Lymphs Abs: 1.9 10*3/uL (ref 0.7–4.0)
MCHC: 33.9 g/dL (ref 30.0–36.0)
MCV: 98.9 fL (ref 78.0–100.0)
Monocytes Absolute: 0.6 10*3/uL (ref 0.1–1.0)
Monocytes Relative: 9 % (ref 3–12)
Neutro Abs: 4 10*3/uL (ref 1.7–7.7)
Neutrophils Relative %: 60 % (ref 43–77)
Platelets: 229 10*3/uL (ref 150–400)
Potassium: 4.4 meq/L (ref 3.5–5.3)
RBC: 4.56 M/uL (ref 4.22–5.81)
RDW: 13.5 % (ref 11.5–15.5)
Sodium: 139 meq/L (ref 135–145)
Total Bilirubin: 0.9 mg/dL (ref 0.3–1.2)
Total Protein: 8 g/dL (ref 6.0–8.3)
WBC: 6.8 10*3/uL (ref 4.0–10.5)

## 2007-12-06 ENCOUNTER — Encounter: Payer: Self-pay | Admitting: Internal Medicine

## 2007-12-06 ENCOUNTER — Encounter (HOSPITAL_COMMUNITY): Admission: RE | Admit: 2007-12-06 | Discharge: 2008-03-05 | Payer: Self-pay | Admitting: Gastroenterology

## 2007-12-06 ENCOUNTER — Ambulatory Visit: Payer: Self-pay | Admitting: Infectious Diseases

## 2007-12-28 ENCOUNTER — Telehealth: Payer: Self-pay | Admitting: Internal Medicine

## 2007-12-29 ENCOUNTER — Emergency Department (HOSPITAL_BASED_OUTPATIENT_CLINIC_OR_DEPARTMENT_OTHER): Admission: EM | Admit: 2007-12-29 | Discharge: 2007-12-29 | Payer: Self-pay | Admitting: Emergency Medicine

## 2008-01-01 ENCOUNTER — Emergency Department (HOSPITAL_COMMUNITY): Admission: EM | Admit: 2008-01-01 | Discharge: 2008-01-01 | Payer: Self-pay | Admitting: Emergency Medicine

## 2008-01-05 ENCOUNTER — Encounter (HOSPITAL_COMMUNITY): Admission: RE | Admit: 2008-01-05 | Discharge: 2008-03-09 | Payer: Self-pay | Admitting: Emergency Medicine

## 2008-03-08 ENCOUNTER — Encounter (HOSPITAL_COMMUNITY): Admission: RE | Admit: 2008-03-08 | Discharge: 2008-03-08 | Payer: Self-pay | Admitting: Gastroenterology

## 2008-08-04 ENCOUNTER — Ambulatory Visit: Payer: Self-pay | Admitting: Internal Medicine

## 2008-08-04 ENCOUNTER — Observation Stay (HOSPITAL_COMMUNITY): Admission: EM | Admit: 2008-08-04 | Discharge: 2008-08-06 | Payer: Self-pay | Admitting: Cardiovascular Disease

## 2008-08-04 ENCOUNTER — Encounter: Payer: Self-pay | Admitting: Cardiovascular Disease

## 2008-08-04 ENCOUNTER — Encounter (HOSPITAL_COMMUNITY): Admission: RE | Admit: 2008-08-04 | Discharge: 2008-08-04 | Payer: Self-pay | Admitting: Gastroenterology

## 2008-08-06 ENCOUNTER — Encounter: Payer: Self-pay | Admitting: Cardiovascular Disease

## 2008-08-09 ENCOUNTER — Encounter: Payer: Self-pay | Admitting: Internal Medicine

## 2008-11-10 ENCOUNTER — Encounter (HOSPITAL_COMMUNITY): Admission: RE | Admit: 2008-11-10 | Discharge: 2009-02-08 | Payer: Self-pay | Admitting: Gastroenterology

## 2009-01-15 ENCOUNTER — Encounter (HOSPITAL_COMMUNITY): Admission: RE | Admit: 2009-01-15 | Discharge: 2009-01-16 | Payer: Self-pay | Admitting: Gastroenterology

## 2009-02-21 ENCOUNTER — Ambulatory Visit: Payer: Self-pay | Admitting: Diagnostic Radiology

## 2009-02-21 ENCOUNTER — Emergency Department (HOSPITAL_BASED_OUTPATIENT_CLINIC_OR_DEPARTMENT_OTHER): Admission: EM | Admit: 2009-02-21 | Discharge: 2009-02-21 | Payer: Self-pay | Admitting: Emergency Medicine

## 2009-02-26 ENCOUNTER — Emergency Department (HOSPITAL_BASED_OUTPATIENT_CLINIC_OR_DEPARTMENT_OTHER): Admission: EM | Admit: 2009-02-26 | Discharge: 2009-02-26 | Payer: Self-pay | Admitting: Emergency Medicine

## 2009-03-15 ENCOUNTER — Encounter (HOSPITAL_COMMUNITY): Admission: RE | Admit: 2009-03-15 | Discharge: 2009-05-16 | Payer: Self-pay | Admitting: Gastroenterology

## 2009-03-22 ENCOUNTER — Encounter: Payer: Self-pay | Admitting: Internal Medicine

## 2009-06-21 ENCOUNTER — Encounter: Payer: Self-pay | Admitting: Internal Medicine

## 2009-06-21 ENCOUNTER — Ambulatory Visit: Payer: Self-pay | Admitting: Infectious Diseases

## 2009-06-21 LAB — CONVERTED CEMR LAB
ALT: 49 units/L (ref 0–53)
AST: 49 units/L — ABNORMAL HIGH (ref 0–37)
Albumin: 4.6 g/dL (ref 3.5–5.2)
Alkaline Phosphatase: 136 units/L — ABNORMAL HIGH (ref 39–117)
BUN: 18 mg/dL (ref 6–23)
Basophils Absolute: 0 10*3/uL (ref 0.0–0.1)
Basophils Relative: 0 % (ref 0–1)
CO2: 22 meq/L (ref 19–32)
Calcium: 9.5 mg/dL (ref 8.4–10.5)
Chloride: 104 meq/L (ref 96–112)
Creatinine, Ser: 0.91 mg/dL (ref 0.40–1.50)
Eosinophils Absolute: 0.1 10*3/uL (ref 0.0–0.7)
Eosinophils Relative: 3 % (ref 0–5)
Glucose, Bld: 166 mg/dL — ABNORMAL HIGH (ref 70–99)
HCT: 43.9 % (ref 39.0–52.0)
HIV 1 RNA Quant: <48 copies/mL (ref <48)
HIV-1 RNA Quant, Log: <1.68 (ref <1.68)
Hemoglobin: 14.6 g/dL (ref 13.0–17.0)
Lymphocytes Relative: 19 % (ref 12–46)
Lymphs Abs: 0.8 10*3/uL (ref 0.7–4.0)
MCHC: 33.3 g/dL (ref 30.0–36.0)
MCV: 98.7 fL (ref 78.0–100.0)
Monocytes Absolute: 0.6 10*3/uL (ref 0.1–1.0)
Monocytes Relative: 13 % — ABNORMAL HIGH (ref 3–12)
Neutro Abs: 2.9 10*3/uL (ref 1.7–7.7)
Neutrophils Relative %: 65 % (ref 43–77)
Platelets: 180 10*3/uL (ref 150–400)
Potassium: 4.8 meq/L (ref 3.5–5.3)
RBC: 4.45 M/uL (ref 4.22–5.81)
RDW: 14.2 % (ref 11.5–15.5)
Sodium: 139 meq/L (ref 135–145)
Total Bilirubin: 1.2 mg/dL (ref 0.3–1.2)
Total Protein: 7.3 g/dL (ref 6.0–8.3)
WBC: 4.5 10*3/uL (ref 4.0–10.5)

## 2009-07-05 ENCOUNTER — Ambulatory Visit: Payer: Self-pay | Admitting: Infectious Diseases

## 2009-07-11 ENCOUNTER — Encounter: Payer: Self-pay | Admitting: Internal Medicine

## 2009-07-13 ENCOUNTER — Ambulatory Visit: Payer: Self-pay | Admitting: Diagnostic Radiology

## 2009-07-13 ENCOUNTER — Ambulatory Visit (HOSPITAL_BASED_OUTPATIENT_CLINIC_OR_DEPARTMENT_OTHER): Admission: RE | Admit: 2009-07-13 | Discharge: 2009-07-13 | Payer: Self-pay | Admitting: Gastroenterology

## 2009-08-09 ENCOUNTER — Ambulatory Visit (HOSPITAL_COMMUNITY): Admission: RE | Admit: 2009-08-09 | Discharge: 2009-08-09 | Payer: Self-pay | Admitting: Gastroenterology

## 2009-08-22 ENCOUNTER — Encounter: Payer: Self-pay | Admitting: Infectious Disease

## 2009-08-22 ENCOUNTER — Telehealth (INDEPENDENT_AMBULATORY_CARE_PROVIDER_SITE_OTHER): Payer: Self-pay | Admitting: *Deleted

## 2010-02-28 ENCOUNTER — Encounter: Payer: Self-pay | Admitting: Internal Medicine

## 2010-03-10 LAB — HM DIABETES FOOT EXAM

## 2010-03-10 LAB — HM DIABETES EYE EXAM: HM Diabetic Eye Exam: NORMAL

## 2010-04-09 NOTE — Assessment & Plan Note (Signed)
Summary: CPX/CCM rsc per doc/njr   Vital Signs:  Patient Profile:   60 Years Old Male Height:     68.5 inches Weight:      194 pounds Temp:     98.2 degrees F oral Pulse rate:   84 / minute BP sitting:   118 / 76  Vitals Entered By: Gladis Riffle, RN (January 18, 2007 2:49 PM)                st  Chief Complaint:  CPX and labs done--states has had flu shot--BP at home 112-114/64-68  A1c 5.1 at diabetic center per pt.  History of Present Illness: CPX  Current Allergies (reviewed today): ! DEMEROL ! CODEINE  Past Medical History:    HIV disease-dx 1990    Hypertension    hemochromotosis monthly phlebotomy    Gout  Past Surgical History:    Reviewed history from 06/22/2006 and no changes required:       Cholecystecty       adrenalectomy-age 34          Family History:    Reviewed history from 06/22/2006 and no changes required:       Family History Hypertension-mother and father       a fib-mohter and father  Social History:    Reviewed history from 06/22/2006 and no changes required:       Occupation: [employer]       Never Smoked       Alcohol use-no       Regular exercise-yes 2-3 times weekly    Review of Systems       no other complaints in a complete ROS      Impression & Recommendations:  Problem # 1:  HIV DISEASE (ICD-042)  His updated medication list for this problem includes:    Atripla 600-200-300 Mg Tabs (Efavirenz-emtricitab-tenofovir) ..... One pill a day   Problem # 2:  DIABETES MELLITUS, TYPE II, UNCONTROLLED (ICD-250.02)  His updated medication list for this problem includes:    Micardis 80 Mg Tabs (Telmisartan) .Marland Kitchen... Take 1 tablet by mouth once a day  Labs Reviewed: HgBA1c: 5.3 (01/04/2007)   Creat: 1.0 (01/04/2007)      Problem # 3:  HYPERTENSION (ICD-401.9)  His updated medication list for this problem includes:    Micardis 80 Mg Tabs (Telmisartan) .Marland Kitchen... Take 1 tablet by mouth once a day    Coreg 25 Mg  Tabs (Carvedilol) .Marland Kitchen... Take 1 tablet by mouth two times a day    Amlodipine Besylate 10 Mg Tabs (Amlodipine besylate) .Marland Kitchen... Take 1 tablet by mouth once a day  BP today: 118/76 Prior BP: 104/60 (10/26/2006)  Labs Reviewed: Creat: 1.0 (01/04/2007) Chol: 139 (01/04/2007)   HDL: 27.7 (01/04/2007)   LDL: 83 (01/04/2007)   TG: 144 (01/04/2007)   Complete Medication List: 1)  Atripla 600-200-300 Mg Tabs (Efavirenz-emtricitab-tenofovir) .... One pill a day 2)  Micardis 80 Mg Tabs (Telmisartan) .... Take 1 tablet by mouth once a day 3)  Coreg 25 Mg Tabs (Carvedilol) .... Take 1 tablet by mouth two times a day 4)  Colchicine 0.6 Mg Tabs (Colchicine) .... Take 2 tablets by mouth once a day 5)  Allopurinol 300 Mg Tabs (Allopurinol) .... Take 1 tablet by mouth once a day 6)  Amlodipine Besylate 10 Mg Tabs (Amlodipine besylate) .... Take 1 tablet by mouth once a day  Other Orders: Influenza Vaccine NON MCR (16109) EKG w/ Interpretation (93000)   Patient Instructions:  1)  see me 6 months.  2)  cancel Feb, 2009 appt. 3)  Consider tapering dose of colchicine    ]  Influenza Vaccine    Vaccine Type: Fluvax Non-MCR    Given by: todd  Flu Vaccine Consent Questions    Do you have a history of severe allergic reactions to this vaccine? no    Any prior history of allergic reactions to egg and/or gelatin? no    Do you have a sensitivity to the preservative Thimersol? no    Do you have a past history of Guillan-Barre Syndrome? no    Do you currently have an acute febrile illness? no    Have you ever had a severe reaction to latex? no    Vaccine information given and explained to patient? yes  lot [id number], EXP 30 jun 09, sanofi pasteur left deltoid IM, 0.5 cc.  Physical Exam General Appearance: well developed, well nourished, no acute distress Eyes: conjunctiva and lids normal, PERRLA, EOMI, fundi WNL Ears, Nose, Mouth, Throat: TM clear, nares clear, oral exam WNL Neck: supple, no  lymphadenopathy, no thyromegaly, no JVD Respiratory: clear to auscultation and percussion, respiratory effort normal Cardiovascular: regular rate and rhythm, S1-S2, no murmur, rub or gallop, no bruits, peripheral pulses normal and symmetric, no cyanosis, clubbing, edema or varicosities Chest: no scars, masses, tenderness; no asymmetry, skin changes, nipple discharge, no gynecomastia   Gastrointestinal: soft, non-tender; no hepatosplenomegaly, masses; active bowel sounds all quadrants, guaiac negative stool; no masses, tenderness, hemorrhoids  Genitourinary: no hernia, testicular mass, or prostate enlargement Lymphatic: no cervical, axillary or inguinal adenopathy Musculoskeletal: gait normal, muscle tone and strength WNL, no joint swelling, effusions, discoloration, crepitus  Skin: clear, good turgor, color WNL, no rashes, lesions, or ulcerations Neurologic: normal mental status, normal reflexes, normal strength, sensation, and motion Psychiatric: alert; oriented to person, place and time Other Exam:     Appended Document: Orders Update    Clinical Lists Changes  Orders: Added new Service order of Tdap => 85yrs IM (16109) - Signed Added new Service order of Admin 1st Vaccine (60454) - Signed Added new Service order of Est. Patient 40-64 years [phone]) - Signed Observations: Added new observation of TD BOOSTERLO: [id number] (01/18/2007 15:44) Added new observation of TD BOOST EXP: 01/09/2009 (01/18/2007 15:44) Added new observation of TD BOOSTERBY: todd (01/18/2007 15:44) Added new observation of TD BOOSTERRT: IM (01/18/2007 15:44) Added new observation of TDBOOSTERDSE: 0.5 ml (01/18/2007 15:44) Added new observation of TD BOOST SIT: right deltoid (01/18/2007 15:44) Added new observation of TD BOOSTER: Tdap (01/18/2007 15:44)        Tetanus/Td Vaccine    Vaccine Type: Tdap    Site: right deltoid    Dose: 0.5 ml    Route: IM    Given by: todd    Exp. Date: 01/09/2009    Lot #:  [id number]

## 2010-04-09 NOTE — Consult Note (Signed)
Summary: [facility] :Dr.Coladonato  [facility] :Dr.Coladonato   Imported By: Florinda Marker 02/09/2007 15:10:06  _____________________________________________________________________  External Attachment:    Type:   Image     Comment:   External Document

## 2010-04-09 NOTE — Miscellaneous (Signed)
Summary: HIPAA Restrictions  HIPAA Restrictions   Imported By: Florinda Marker 11/23/2007 14:30:20  _____________________________________________________________________  External Attachment:    Type:   Image     Comment:   External Document

## 2010-04-09 NOTE — Assessment & Plan Note (Signed)
Summary: fukam 9:15   Chief Complaint:  check-up.  Current Allergies (reviewed today): ! DEMEROL ! CODEINE    Risk Factors:  Tobacco use:  never Alcohol use:  no Exercise:  yes  Colonoscopy History:    Date of Last Colonoscopy:  03/10/2002    Vital Signs:  Patient Profile:   60 Years Old Male Weight:      204.1 pounds (92.77 kg) Temp:     98.7 degrees F (37.06 degrees C) oral Pulse rate:   78 / minute BP sitting:   98 / 65  (right arm)  Pt. in pain?   no  Vitals Entered By: Jennet Maduro RN (September 07, 2006 9:13 AM)              Is Patient Diabetic? Yes  Nutritional Status BMI of 19 -24 = normal Nutritional Status Detail appetite "doing a special diet, 1000 cal/day, has lost 41 lbs.)  Have you ever been in a relationship where you felt threatened, hurt or afraid?No   Does patient need assistance? Functional Status Self care Ambulation Normal     Medications Added to Medication List This Visit: 1)  Micardis 80 Mg Tabs (Telmisartan) .... Take 1 tablet by mouth once a day 2)  Coreg 25 Mg Tabs (Carvedilol) .... Take 1 tablet by mouth two times a day 3)  Colchicine 0.6 Mg Tabs (Colchicine) .... Take 2 tablets by mouth once a day 4)  Allopurinol 300 Mg Tabs (Allopurinol) .... Take 1 tablet by mouth once a day 5)  Amlodipine Besylate 10 Mg Tabs (Amlodipine besylate) .... Take 1 tablet by mouth once a day    ] Prescriptions: ATRIPLA 600-200-300 MG TABS (EFAVIRENZ-EMTRICITAB-TENOFOVIR) one pill a day  #100 x 12   Entered and Authorized by:   Lenn Sink MD   Signed by:   Lenn Sink MD on 09/07/2006   Method used:   Print then Give to Patient   RxID:   [id number]    Pneumovax Vaccine    Vaccine Type: Pneumovax    Site: left deltoid    Mfr: Merck    Dose: 0.5 ml    Route: IM    Given by: Jennet Maduro RN    Exp. Date: 04/14/2008    Lot #: [id number]  Please see scanned hand written note. W

## 2010-04-09 NOTE — Letter (Signed)
Summary: [facility]   Imported By: Maryln Gottron 07/31/2009 13:37:28  _____________________________________________________________________  External Attachment:    Type:   Image     Comment:   External Document

## 2010-04-09 NOTE — Progress Notes (Signed)
Summary: animal bite  Phone Note Call from Patient   Caller: Patient Call For: Dr Cato Mulligan Summary of Call: Pt was bitten by what may have been a coyote, and animal control suggested he get this checked out for possible rabies shots.  He in in route to [facility] and will got to the ER upon arrival. Initial call taken by: Lynann Beaver CMA,  December 28, 2007 9:01 AM  Follow-up for Phone Call        agree with ED appt Follow-up by: Birdie Sons MD,  December 28, 2007 9:51 AM

## 2010-04-09 NOTE — Consult Note (Signed)
Summary: Consultation Report  Consultation Report   Imported By: Randon Goldsmith 09/01/2006 15:39:04  _____________________________________________________________________  External Attachment:    Type:   Image     Comment:   External Document

## 2010-04-09 NOTE — Miscellaneous (Signed)
Summary: Orders Update  Clinical Lists Changes  Orders: Added new Test order of T-CBC w/Diff [phone]) - Signed Added new Test order of T-CD4SP [facility]) (CD4SP) - Signed Added new Test order of T-HIV Viral Load [phone]) - Signed Added new Test order of T-Comprehensive Metabolic Panel [phone]) - Signed Added new Test order of T-Chlamydia  Probe, urine [id number]) - Signed Added new Test order of T-GC Probe, urine [phone]) - Signed Added new Test order of T-RPR (Syphilis) ([id number]) - Signed     Process Orders Check Orders Results:     [facility]: ABN not required for this insurance Tests Sent for requisitioning (June 21, 2009 8:52 AM):     06/21/2009: [facility] -- T-CBC w/Diff [[id number]] (signed)     06/21/2009: [facility] -- T-HIV Viral Load [id number] (signed)     06/21/2009: [facility] -- T-Comprehensive Metabolic Panel [[id number]] (signed)     06/21/2009: [facility] -- T-Chlamydia  Probe, urine [phone] (signed)     06/21/2009: [facility] -- T-GC Probe, urine [phone] (signed)     06/21/2009: [facility] -- T-RPR (Syphilis) [phone] (signed)

## 2010-04-09 NOTE — Assessment & Plan Note (Signed)
Summary: 4 month roa/jnl   Vital Signs:  Patient Profile:   60 Years Old Male Weight:      195 pounds Temp:     98.2 degrees F oral Pulse rate:   88 / minute Pulse rhythm:   regular Resp:     12 per minute BP sitting:   104 / 60  Vitals Entered By: Lynann Beaver CMA (October 26, 2006 9:13 AM)               Chief Complaint:  rov.  History of Present Illness: Here for f/u of htn, dm, lipids---he has lost a tremendous amount of weight by exercising, dieting---is pursuing a diet program through dr. Jennye Boroughs office  Follow-Up Visit      This is a 60 year old man who presents for Follow-up visit.  The patient denies chest pain, palpitations, dizziness, syncope, low blood sugar symptoms, high blood sugar symptoms, edema, SOB, DOE, PND, and orthopnea.  Since the last visit the patient notes no new problems or concerns.  The patient reports taking meds as prescribed.  When questioned about possible medication side effects, the patient notes none.     Current Allergies: ! DEMEROL ! CODEINE  Past Medical History:    Reviewed history from 06/22/2006 and no changes required:       HIV disease-dx 1990       Hypertension       hemochromocytosis monthly phlebotomy       Gout  Past Surgical History:    Reviewed history from 06/22/2006 and no changes required:       Cholecystecty       adrenalectomy-age 9          Family History:    Reviewed history from 06/22/2006 and no changes required:       Family History Hypertension-mother and father       a fib-mohter and father  Social History:    Reviewed history from 06/22/2006 and no changes required:       Occupation: [employer]       Never Smoked       Alcohol use-no       Regular exercise-yes 2-3 times weekly    Review of Systems  The patient denies anorexia, fever, weight loss, vision loss, decreased hearing, hoarseness, chest pain, syncope, dyspnea on exhertion, peripheral edema, prolonged cough,  hemoptysis, abdominal pain, melena, hematochezia, severe indigestion/heartburn, hematuria, incontinence, genital sores, muscle weakness, suspicious skin lesions, transient blindness, difficulty walking, depression, unusual weight change, abnormal bleeding, enlarged lymph nodes, angioedema, and testicular masses.     Physical Exam  General:     Well-developed,well-nourished,in no acute distress; alert,appropriate and cooperative throughout examination Head:     Normocephalic and atraumatic without obvious abnormalities. No apparent alopecia or balding. Eyes:     No corneal or conjunctival inflammation noted. EOMI. Perrla. Funduscopic exam benign, without hemorrhages, exudates or papilledema. Vision grossly normal. Mouth:     Oral mucosa and oropharynx without lesions or exudates.  Teeth in good repair. Neck:     No deformities, masses, or tenderness noted. Chest Wall:     No deformities, masses, tenderness or gynecomastia noted. Lungs:     Normal respiratory effort, chest expands symmetrically. Lungs are clear to auscultation, no crackles or wheezes. Heart:     Normal rate and regular rhythm. S1 and S2 normal without gallop, murmur, click, rub or other extra sounds. Abdomen:     Bowel sounds positive,abdomen soft and non-tender without  masses, organomegaly or hernias noted. Msk:     No deformity or scoliosis noted of thoracic or lumbar spine.   Pulses:     R and L carotid,radial,femoral,dorsalis pedis and posterior tibial pulses are full and equal bilaterally Neurologic:     No cranial nerve deficits noted. Station and gait are normal. Plantar reflexes are down-going bilaterally. DTRs are symmetrical throughout. Sensory, motor and coordinative functions appear intact. Skin:     Intact without suspicious lesions or rashes Cervical Nodes:     No lymphadenopathy noted Axillary Nodes:     No palpable lymphadenopathy Inguinal Nodes:     No significant adenopathy Psych:     Cognition  and judgment appear intact. Alert and cooperative with normal attention span and concentration. No apparent delusions, illusions, hallucinations    Impression & Recommendations:  Problem # 1:  DIABETES MELLITUS, TYPE II, UNCONTROLLED (ICD-250.02) congratulated on tremendous wieght loss! His updated medication list for this problem includes:    Micardis 80 Mg Tabs (Telmisartan) .Marland Kitchen... Take 1 tablet by mouth once a day  Labs Reviewed: HgBA1c: 7.5 (06/22/2006)   Creat: 1.06 (08/24/2006)     A1C---5.4% on 08/24/06   Problem # 2:  HYPERTENSION (ICD-401.9) I would recommend decreasing micardis---he has appt with [facility] soon His updated medication list for this problem includes:    Micardis 80 Mg Tabs (Telmisartan) .Marland Kitchen... Take 1 tablet by mouth once a day    Coreg 25 Mg Tabs (Carvedilol) .Marland Kitchen... Take 1 tablet by mouth two times a day    Amlodipine Besylate 10 Mg Tabs (Amlodipine besylate) .Marland Kitchen... Take 1 tablet by mouth once a day  BP today: 104/60 Prior BP: 98/65 (09/07/2006)  Labs Reviewed: Creat: 1.06 (08/24/2006) Chol: 114 (08/24/2006)   HDL: 33 (08/24/2006)   LDL: 52 (08/24/2006)   TG: 147 (08/24/2006)   Problem # 3:  HIV DISEASE (ICD-042)  His updated medication list for this problem includes:    Atripla 600-200-300 Mg Tabs (Efavirenz-emtricitab-tenofovir) ..... One pill a day---f/u ID clinic  His updated medication list for this problem includes:    Atripla 600-200-300 Mg Tabs (Efavirenz-emtricitab-tenofovir) ..... One pill a day   Complete Medication List: 1)  Atripla 600-200-300 Mg Tabs (Efavirenz-emtricitab-tenofovir) .... One pill a day 2)  Micardis 80 Mg Tabs (Telmisartan) .... Take 1 tablet by mouth once a day 3)  Coreg 25 Mg Tabs (Carvedilol) .... Take 1 tablet by mouth two times a day 4)  Colchicine 0.6 Mg Tabs (Colchicine) .... Take 2 tablets by mouth once a day 5)  Allopurinol 300 Mg Tabs (Allopurinol) .... Take 1 tablet by mouth once a day 6)   Amlodipine Besylate 10 Mg Tabs (Amlodipine besylate) .... Take 1 tablet by mouth once a day   Patient Instructions: 1)  see me 6 months- 2)  BMP prior to visit, ICD-9: 3)  Hepatic Panel prior to visit, ICD-9: 4)  Lipid Panel prior to visit, ICD-9: 5)  HbgA1C prior to visit, ICD-9: 6)  Urine Microalbumin prior to visit, ICD-9:

## 2010-04-09 NOTE — Consult Note (Signed)
Summary: [facility]: Dr. G[facility]: Dr. Arrie Aran   Imported By: Florinda Marker 09/14/2007 12:47:21  _____________________________________________________________________  External Attachment:    Type:   Image     Comment:   External Document

## 2010-04-09 NOTE — Progress Notes (Signed)
Summary: Pt. request to change MD to either Dr. Ninetta Lights or Dr. Daiva Eves  Phone Note Call from Patient   Caller: Patient Reason for Call: Talk to Nurse Summary of Call: Fax received from pt. requesting change from Dr. Sampson Goon to Dr. Ninetta Lights or Dr. Daiva Eves with Dr. Jarrett Ables leaving.  Would appreciate his next labs and OV in Aprill 2012.  Will scan in pt's request for MD change.  Jennet Maduro RN  August 22, 2009 12:20 PM

## 2010-04-09 NOTE — Consult Note (Signed)
Summary: Consultation Report/Rtn Office Visit/[facility], [facility]  Consultation Report/Rtn Office Visit/[facility], MD   Imported By: Randon Goldsmith 09/08/2006 11:21:38  _____________________________________________________________________  External Attachment:    Type:   Image     Comment:   External Document

## 2010-04-09 NOTE — Miscellaneous (Signed)
Summary: RW - HIV/AIDS Status  Clinical Lists Changes  Observations: Added new observation of YEARAIDSPOS: HIV2008, c (03/09/2007 11:22) Added new observation of INFECTDIS MD: Sampson Goon MD (03/09/2007 11:22)                                                               Hep C Result:  No

## 2010-04-09 NOTE — Letter (Signed)
Summary: [facility]  [facility]   Imported By: Maryln Gottron 01/04/2008 14:05:45  _____________________________________________________________________  External Attachment:    Type:   Image     Comment:   External Document

## 2010-04-09 NOTE — Progress Notes (Signed)
Summary: phone call re: blood sugar 05-19-06  Phone Note Call from Patient   Caller: Patient Reason for Call: Talk to Nurse Summary of Call: Pt called c/o blood sugar elevated non fasting 376 and at last OV in Feb 333. He wants to know if he should go to Endocrinologist. Per Dr Roxan Hockey he needs Primary Care physician:  Dr Birdie Sons name was given as a referral. Pt says he will call to make appt. Initial call taken by: Tomasita Morrow RN,  May 20, 2006 5:04 PM

## 2010-04-09 NOTE — Progress Notes (Signed)
Summary: New script/mld  Phone Note From Pharmacy   Caller: [facility] Reason for Call: Needs renewal Summary of Call: Received fax refill request for patient's Atripla.  Please call [facility] at [phone] with the approval. Initial call taken by: Paulo Fruit  BS,CPht II,MPH,  August 22, 2009 4:18 PM    Prescriptions: ATRIPLA 600-200-300 MG TABS (EFAVIRENZ-EMTRICITAB-TENOFOVIR) one pill a day  #30 x 12   Entered by:   Paulo Fruit  BS,CPht II,MPH   Authorized by:   Johny Sax MD   Signed by:   Paulo Fruit  BS,CPht II,MPH on 08/22/2009   Method used:   Telephoned to ...       [facility] (mail-order)             , [place]         Ph:        Fax: [phone]   RxID:   [id number]  spoke to the Pharmacist, Adin Hector who read back the order. Paulo Fruit  BS,CPht II,MPH  August 22, 2009 4:25 PM

## 2010-04-09 NOTE — Miscellaneous (Signed)
Summary: Lab Rpt: CD4  Clinical Lists Changes  Observations: Added new observation of CD4 COUNT: 380 microliters (08/24/2006 17:00)

## 2010-04-09 NOTE — Letter (Signed)
Summary: Dr Kinnie Scales note  Dr Kinnie Scales note   Imported By: Kassie Mends 03/24/2007 09:44:01  _____________________________________________________________________  External Attachment:    Type:   Image     Comment:   Dr Kinnie Scales note

## 2010-04-09 NOTE — Assessment & Plan Note (Signed)
Summary: EST/CK/CFB   Visit Type:  Follow-up PCP:  Dr Birdie Sons  Chief Complaint:  fu.  History of Present Illness: Doing great.  No active complaints.  Continues to see Dr Cato Mulligan, Dr Ubaldo Glassing and GI.  gets periodic phlebotomy for hemochromatosis. No intercurrent illnesses or admission. 100% compliance.     Updated Prior Medication List: ATRIPLA 600-200-300 MG TABS (EFAVIRENZ-EMTRICITAB-TENOFOVIR) one pill a day MICARDIS 80 MG  TABS (TELMISARTAN) Take 1 tablet by mouth once a day COREG 25 MG  TABS (CARVEDILOL) Take 1 tablet by mouth two times a day COLCHICINE 0.6 MG  TABS (COLCHICINE) Take 2 tablets by mouth once a day ALLOPURINOL 300 MG  TABS (ALLOPURINOL) Take 1 tablet by mouth once a day AMLODIPINE BESYLATE 10 MG  TABS (AMLODIPINE BESYLATE) Take 1 tablet by mouth once a day  Current Allergies (reviewed today): ! DEMEROL ! CODEINE  Past Medical History:    Reviewed history from 01/18/2007 and no changes required:       HIV disease-dx 1990 through a life insurance exam - no history of OIs       Hypertension - Follows at Dr Lanney Gins       hemochromotosis monthly phlebotomy - follows at GI - dxed 7 yrs ago.        Gout -       Glucose intolerance - A1C went from 7.5-  5.4 with lifestyle modications   Family History:    Reviewed history from 02/25/2007 and no changes required:       Family History Hypertension, high cholesterol -mother and father       a fib-mohter and father  Social History:    Reviewed history from 02/25/2007 and no changes required:       Occupation: mkting [employer]       Never Smoked       Alcohol use-no       Regular exercise-yes 2-3 times weekly       Retired from work but then went back in 2009.       Not sexually active for 7 yrs.   Risk Factors: Tobacco use:  never Alcohol use:  no Exercise:  yes  Colonoscopy History:    Date of Last Colonoscopy:  03/10/2002   Review of Systems       11 systems reviewed and  negative except per HPI  Flu Vaccine Consent Questions     Do you have a history of severe allergic reactions to this vaccine? no    Any prior history of allergic reactions to egg and/or gelatin? no    Do you have a sensitivity to the preservative Thimersol? no    Do you have a past history of Guillan-Barre Syndrome? no    Do you currently have an acute febrile illness? no    Have you ever had a severe reaction to latex? no    Vaccine information given and explained to patient? yes    Are you currently pregnant? no    Lot Number:[id number]  exp  09-06-2008   Site Given  Left Deltoid IM Evon Alsbrooks CMA  December 06, 2007 3:15 PM   Vital Signs:  Patient Profile:   60 Years Old Male Height:     68.5 inches (173.99 cm) Weight:      207.9 pounds (94.50 kg) BMI:     31.26 Temp:     99.3 degrees F (37.39 degrees C) oral Pulse rate:   80 / minute BP sitting:  99 / 63  (left arm)  Pt. in pain?   no              Is Patient Diabetic? Yes  Nutritional Status BMI of > 30 = obese Nutritional Status Detail normal  Have you ever been in a relationship where you felt threatened, hurt or afraid?No  Domestic Violence Intervention none  Does patient need assistance? Functional Status Self care Ambulation Normal Comments patient states he is taking his HAART     Physical Exam  General:     alert, well-developed, and well-nourished.   Eyes:     vision grossly intact, pupils equal, and pupils round.   Mouth:     good dentition.   Neck:     supple.   Lungs:     normal respiratory effort, no intercostal retractions, and normal breath sounds.   Heart:     normal rate, regular rhythm, and no murmur.   Abdomen:     soft, non-tender, and normal bowel sounds.   Msk:     normal ROM and no joint tenderness.   Neurologic:     alert & oriented X3, cranial nerves II-XII intact, strength normal in all extremities, and sensation intact to light touch.   Skin:     no rashes.   Cervical  Nodes:     no anterior cervical adenopathy and no posterior cervical adenopathy.   Psych:     Oriented X3.      Impression & Recommendations:  Problem # 1:  HIV DISEASE (ICD-042) Doing great.  His VL is consistently suppressed and CD4 over 300.  Encouraged compliance and told him what a great job he is doing.  Cont current meds His updated medication list for this problem includes:    Atripla 600-200-300 Mg Tabs (Efavirenz-emtricitab-tenofovir) ..... One pill a day  Diagnostics Reviewed:  CD4: 360 (11/24/2007)   Hgb: 15.3 (11/23/2007)   HCT: 45.1 (11/23/2007)   HIV-1 RNA: <50 copies/mL (11/23/2007)   HBSAg: No (05/04/2006)   Problem # 2:  PREVENTIVE HEALTH CARE (ICD-V70.0)  Reviewed preventive care protocols, scheduled due services, and updated immunizations. Got flu shot.   Had colonoscopy in NOV 08 showed benign polyps - f/u in 3 yrs   Problem # 3:  HEMOCHROMATOSIS, HX OF (ICD-V12.3)  followed by GI   Problem # 4:  DIABETES MELLITUS, TYPE II, UNCONTROLLED (ICD-250.02)  His updated medication list for this problem includes:    Micardis 80 Mg Tabs (Telmisartan) .Marland Kitchen... Take 1 tablet by mouth once a day  Follows with Dr Marliss Coots - diet controlled His updated medication list for this problem includes:    Micardis 80 Mg Tabs (Telmisartan) .Marland Kitchen... Take 1 tablet by mouth once a day  Labs Reviewed: HgBA1c: 5.3 (01/04/2007)   Creat: 1.10 (11/23/2007)   Microalbumin: 2.2 (01/04/2007)   Problem # 5:  HYPERTENSION (ICD-401.9)  His updated medication list for this problem includes:    Micardis 80 Mg Tabs (Telmisartan) .Marland Kitchen... Take 1 tablet by mouth once a day    Coreg 25 Mg Tabs (Carvedilol) .Marland Kitchen... Take 1 tablet by mouth two times a day    Amlodipine Besylate 10 Mg Tabs (Amlodipine besylate) .Marland Kitchen... Take 1 tablet by mouth once a day  BP today: 99/63 Prior BP: 124/72 (02/25/2007)  Labs Reviewed: Creat: 1.10 (11/23/2007) Chol: 139 (01/04/2007)   HDL: 27.7 (01/04/2007)   LDL: 83  (01/04/2007)   TG: 144 (01/04/2007)   Other Orders: Admin 1st Vaccine (16109) Flu Vaccine 42yrs + (  03474)    Patient Instructions: 1)  Please schedule a follow-up appointment in 9 months.   Prescriptions: ATRIPLA 600-200-300 MG TABS (EFAVIRENZ-EMTRICITAB-TENOFOVIR) one pill a day  #100 x 12   Entered and Authorized by:   Clydie Braun MD   Signed by:   Clydie Braun MD on 12/06/2007   Method used:   Print then Give to Patient   RxID:   [id number]  ]

## 2010-04-09 NOTE — Letter (Signed)
Summary: Letter  Letter   Imported By: Florinda Marker 08/24/2009 13:45:15  _____________________________________________________________________  External Attachment:    Type:   Image     Comment:   External Document

## 2010-04-09 NOTE — Miscellaneous (Signed)
Summary: update [name] and Immunizations Record  Clinical Lists Changes  Observations: Added new observation of HEPAVAX #2: Historical (04/11/2002 12:06) Added new observation of HEPAVAX #1: Historical (03/22/2001 12:06) Added new observation of PNEUMOVAX: Historical (02/12/2001 12:04)     Pneumovax Immunization History:    Pneumovax # 1:  Historical (02/12/2001)  Hepatitis A Immunization History:    Hep A # 1:  Historical (03/22/2001)    Hep A # 2:  Historical (04/11/2002)

## 2010-04-09 NOTE — Miscellaneous (Signed)
Summary: flu shot  Clinical Lists Changes  Observations: Added new observation of FLU VAX: fluvirin (03/21/2009 11:52)      Immunization History:  Influenza Immunization History:    Influenza:  fluvirin (03/21/2009)  given at [facility]

## 2010-04-09 NOTE — Miscellaneous (Signed)
Summary: RW  Clinical Lists Changes  Observations: Added new observation of ABNLIPIDS: No (08/24/2006 14:34) Added new observation of LASTLIPIDPRO: 08/24/2006 (08/24/2006 14:34) Added new observation of SYPHL_POS: No (08/24/2006 14:34)

## 2010-04-09 NOTE — Assessment & Plan Note (Signed)
Vital Signs:  Patient Profile:   60 Years Old Male BP sitting:   144 / 88               History of Present Illness: HIV: undetectable viral load. Sees dr. Roxan Hockey q 3 months  htn: no headache no neurologic deficit. has seen nephrology before  Acute Visit History: The patient has a prior history of diabetes.         Diabetes Management History:      He has not been enrolled in the "Diabetic Education Program".  He states lack of understanding of dietary principles and is not following his diet appropriately.  No sensory loss is reported.  Self foot exams are not being performed.  He is not checking home blood sugars.  He says that he is exercising.        There are no symptoms to suggest diabetic complications.  Other questions/concerns include: this is a new diagnosis.  No changes have been made to his treatment plan since last visit.    Hypertension History:      He denies headache, chest pain, palpitations, dyspnea with exertion, orthopnea, PND, peripheral edema, visual symptoms, neurologic problems, syncope, and side effects from treatment.        Positive major cardiovascular risk factors include male age 44 years old or older, diabetes, and hypertension.  Negative major cardiovascular risk factors include non-tobacco-user status.       Prior Medications: ATRIPLA 600-200-300 MG TABS (EFAVIRENZ-EMTRICITAB-TENOFOVIR) one pill a day Current Allergies: ! DEMEROL ! CODEINE  Past Medical History:    HIV disease-dx 1990    Hypertension    hemochromocytosis monthly phlebotomy    Gout  Past Surgical History:    Cholecystecty    adrenalectomy-age 46       Family History:    Family History Hypertension-mother and father    a fib-mohter and father  Social History:    Occupation: [employer]    Never Smoked    Alcohol use-no    Regular exercise-yes 2-3 times weekly   Risk Factors:  Tobacco use:  never Alcohol use:  no Exercise:   yes  Colonoscopy History:     Date of Last Colonoscopy:  03/10/2002    Results:  Normal     Physical Exam  General:     Well-developed,well-nourished,in no acute distress; alert,appropriate and cooperative throughout examination Head:     Normocephalic and atraumatic without obvious abnormalities. No apparent alopecia or balding. Eyes:     No corneal or conjunctival inflammation noted. EOMI. Perrla. Funduscopic exam benign, without hemorrhages, exudates or papilledema. Vision grossly normal. Mouth:     Oral mucosa and oropharynx without lesions or exudates.  Teeth in good repair. Neck:     No deformities, masses, or tenderness noted. Chest Wall:     No deformities, masses, tenderness or gynecomastia noted. Lungs:     Normal respiratory effort, chest expands symmetrically. Lungs are clear to auscultation, no crackles or wheezes. Heart:     Normal rate and regular rhythm. S1 and S2 normal without gallop, murmur, click, rub or other extra sounds. Abdomen:     Bowel sounds positive,abdomen soft and non-tender without masses, organomegaly or hernias noted.    Impression & Recommendations:  Problem # 1:  HEMOCHROMATOSIS, HX OF (ICD-V12.3)  Diabetes Management Assessment/Plan:      The following lipid goals have been established for the patient: Total cholesterol goal of 200; LDL cholesterol goal of 100; HDL cholesterol  goal of 40; Triglyceride goal of 200.    Hypertension Assessment/Plan:      The patient's hypertensive risk group is category C: Target organ damage and/or diabetes.  Today's blood pressure is 144/88.

## 2010-04-09 NOTE — Letter (Signed)
Summary: [name]-12/15/2005  [name]-12/15/2005   Imported By: Dorice Lamas 04/20/2006 09:24:54  _____________________________________________________________________  External Attachment:    Type:   Image     Comment:   External Document

## 2010-04-09 NOTE — Consult Note (Signed)
Summary: Dr Arrie Aran note  Dr Arrie Aran note   Imported By: Kassie Mends 05/21/2007 10:26:16  _____________________________________________________________________  External Attachment:    Type:   Image     Comment:   Dr Arrie Aran note

## 2010-04-09 NOTE — Letter (Signed)
Summary: HIV Summary  HIV Summary   Imported By: Dorice Lamas 04/20/2006 09:23:31  _____________________________________________________________________  External Attachment:    Type:   Image     Comment:   External Document

## 2010-04-09 NOTE — Letter (Signed)
Summary: OV-12/15/2005  OV-12/15/2005   Imported By: Dorice Lamas 04/20/2006 09:20:45  _____________________________________________________________________  External Attachment:    Type:   Image     Comment:   External Document

## 2010-04-09 NOTE — Assessment & Plan Note (Signed)
Summary: REASSIGNED FROM DR Roxan Hockey   Chief Complaint:  f/u and HIV follow-up.  History of Present Illness:       The patient comes in today for HIV follow-up.  He denies problems since last visit, anorexia, body fat changes, cough, diarrhea, dysphagia, dysuria, fatigue, fever, genital discharge, headaches, medication side effects, muscle weakness, nausea, night sweats, rash, shortness of breath, vomiting.[name] Dr Cato Mulligan in IM - had A1C 7.9 and with lifestyle and diet changes it is down to 5.1 without meds.  Has lost 55 lbs intentionally since May       Since the last visit the patient notes adherent with medication.     Prior Medications :  ATRIPLA 600-200-300 MG TABS (EFAVIRENZ-EMTRICITAB-TENOFOVIR) one pill a day MICARDIS 80 MG  TABS (TELMISARTAN) Take 1 tablet by mouth once a day COREG 25 MG  TABS (CARVEDILOL) Take 1 tablet by mouth two times a day COLCHICINE 0.6 MG  TABS (COLCHICINE) Take 2 tablets by mouth once a day ALLOPURINOL 300 MG  TABS (ALLOPURINOL) Take 1 tablet by mouth once a day AMLODIPINE BESYLATE 10 MG  TABS (AMLODIPINE BESYLATE) Take 1 tablet by mouth once a day    Current Allergies (reviewed today): ! DEMEROL ! CODEINE   Family History:    Family History Hypertension, high cholesterol -mother and father    a fib-mohter and father  Social History:    Occupation: [employer]    Never Smoked    Alcohol use-no    Regular exercise-yes 2-3 times weekly    Retired from work.    Not sexually active for 7 yrs.   Risk Factors: Tobacco use:  never Alcohol use:  no Exercise:  yes  Colonoscopy History:    Date of Last Colonoscopy:  03/10/2002   Review of Systems      See HPI   Vital Signs:  Patient Profile:   60 Years Old Male Height:     68.5 inches (173.99 cm) Weight:      197.31 pounds (89.69 kg) BMI:     29.67 Temp:     97.6 degrees F (36.44 degrees C) oral Pulse rate:   80 / minute BP sitting:   124 / 72  (left arm)  Pt. in  pain?   no  Vitals Entered By: Starleen Arms (February 25, 2007 3:53 PM)              Is Patient Diabetic? Yes   Does patient need assistance? Functional Status Self care Ambulation Normal     Physical Exam  General:     alert, well-developed, and well-nourished.   Head:     normocephalic.   Eyes:     vision grossly intact, pupils equal, pupils round, and pupils reactive to light.   Mouth:     good dentition.   Neck:     supple and full ROM.   Lungs:     normal respiratory effort and normal breath sounds.   Heart:     normal rate, regular rhythm, and no murmur.   Abdomen:     soft, non-tender, and normal bowel sounds.   Msk:     normal ROM, no joint tenderness, and no joint swelling.   Extremities:     none Neurologic:     alert & oriented X3, cranial nerves II-XII intact, and strength normal in all extremities.   Skin:     no rashes.   Psych:     Oriented  X3.      Impression & Recommendations:  Problem # 1:  HIV DISEASE (ICD-042) Doing great on Atripla.  WIll continue with this and see in 6 months.  Likely is a long term non-progressor.  Has not been on many regimens in past so if has problmes with Atripla we have other regimens he can use.  His updated medication list for this problem includes:    Atripla 600-200-300 Mg Tabs (Efavirenz-emtricitab-tenofovir) ..... One pill a day  Diagnostics Reviewed:  CD4: 310 (02/09/2007)   Hgb: 14.8 (02/08/2007)   HCT: 41.8 (02/08/2007)   HIV-1 RNA: <50 copies/mL (02/08/2007)   HBSAg: No (05/04/2006)  Orders: Est. Patient Level II (63875) T-CBC w/Diff (64332-95188) T-CD4 (41660-63016) T-Comprehensive Metabolic Panel (01093-23557)  Future Orders: T-HIV Viral Load ([id number]) ... 08/06/2007   Problem # 2:  DIABETES MELLITUS, TYPE II, UNCONTROLLED (ICD-250.02) Follows with Dr Marliss Coots - diet controlled His updated medication list for this problem includes:    Micardis 80 Mg Tabs (Telmisartan) .Marland Kitchen... Take 1  tablet by mouth once a day   Problem # 3:  HEMOCHROMATOSIS, HX OF (ICD-V12.3) followed by GI  Problem # 4:  PREVENTIVE HEALTH CARE (ICD-V70.0)  Reviewed preventive care protocols, scheduled due services, and updated immunizations. Got flu shot.   Had colonoscopy in NOV 08 showed benign polyps - f/u in 3 yrs    Patient Instructions: 1)  Please schedule a follow-up appointment in 6 months. 2)  CCDr Birdie Sons    ]

## 2010-04-09 NOTE — Assessment & Plan Note (Signed)
Summary: EST-CK/FU/MEDS/CFB   Primary Provider:  Dr Birdie Sons  CC:  follow-up visit.  History of Present Illness: Doing great.  No active complaints.  Continues to see Dr Cato Mulligan, Dr Ubaldo Glassing and GI.  gets periodic phlebotomy for hemochromatosis. I had seen last in 2009.   Since then he had a hospitalization due to hypotension during phlebotomy.  Was r/o for PE and had neg cardiac cath.   Otherwise he is doing great.  100% compliance.   Preventive Screening-Counseling & Management  Alcohol-Tobacco     Alcohol drinks/day: 0     Smoking Status: never  Caffeine-Diet-Exercise     Caffeine use/day: sodas     Does Patient Exercise: yes     Type of exercise: gym membership     Exercise (avg: min/session): 30-60     Times/week: 3  Safety-Violence-Falls     Seat Belt Use: yes   Updated Prior Medication List: ATRIPLA 600-200-300 MG TABS (EFAVIRENZ-EMTRICITAB-TENOFOVIR) one pill a day MICARDIS 80 MG  TABS (TELMISARTAN) Take 1 tablet by mouth once a day COREG 25 MG  TABS (CARVEDILOL) Take 1 tablet by mouth two times a day COLCHICINE 0.6 MG  TABS (COLCHICINE) Take 2 tablets by mouth once a day ALLOPURINOL 300 MG  TABS (ALLOPURINOL) Take 1 tablet by mouth once a day AMLODIPINE BESYLATE 10 MG  TABS (AMLODIPINE BESYLATE) Take 1 tablet by mouth once a day ACCU-CHEK MULTICLIX LANCETS  MISC (LANCETS) use as directed four times a day ACCU-CHEK AVIVA  STRP (GLUCOSE BLOOD) use as directed four times a day  Current Allergies (reviewed today): ! DEMEROL ! CODEINE Past History:  Past Medical History: Last updated: 12/06/2007 HIV disease-dx 1990 through a life insurance exam - no history of OIs Hypertension - Follows at Dr Lanney Gins hemochromotosis monthly phlebotomy - follows at GI - dxed 7 yrs ago.  Gout - Glucose intolerance - A1C went from 7.5-  5.4 with lifestyle modications  Past Surgical History: Last updated: 06/22/2006 Cholecystecty adrenalectomy-age 73  Family  History: Last updated: 02/25/2007 Family History Hypertension, high cholesterol -mother and father a fib-mohter and father  Social History: Last updated: 07/05/2009 Occupation: Retired from [employer] but is now at [employer]. Never Smoked Alcohol use-no Regular exercise-yes 2-3 times weekly Retired from work but then went back in 2009. Not sexually active for 7 yrs.  Risk Factors: Alcohol Use: 0 (07/05/2009) Caffeine Use: sodas (07/05/2009) Exercise: yes (07/05/2009)  Risk Factors: Smoking Status: never (07/05/2009)  Social History: Occupation: Retired from [employer] but is now at [employer]. Never Smoked Alcohol use-no Regular exercise-yes 2-3 times weekly Retired from work but then went back in 2009. Not sexually active for 7 yrs.  Review of Systems       11 systems reviewed and negative except per HPI   Vital Signs:  Patient profile:   60 year old male Height:      68.5 inches (173.99 cm) Weight:      223.8 pounds (101.73 kg) BMI:     33.65 Temp:     98.1 degrees F (36.72 degrees C) oral Pulse rate:   83 / minute BP sitting:   116 / 75  (right arm)  Vitals Entered By: [name]) (July 05, 2009 9:47 AM)  Problems Prior to Update: 1)  Gilbert's Syndrome  (ICD-277.4) 2)  Preventive Health Care  (ICD-V70.0) 3)  Diabetes Mellitus, Type II, Uncontrolled  (ICD-250.02) 4)  Gout  (ICD-274.9) 5)  Hemochromatosis, Hx  of  (ICD-V12.3) 6)  Hypertension  (ICD-401.9) 7)  HIV Disease  (ICD-042)  Medications Prior to Update: 1)  Atripla 600-200-300 Mg Tabs (Efavirenz-Emtricitab-Tenofovir) .... One Pill A Day 2)  Micardis 80 Mg  Tabs (Telmisartan) .... Take 1 Tablet By Mouth Once A Day 3)  Coreg 25 Mg  Tabs (Carvedilol) .... Take 1 Tablet By Mouth Two Times A Day 4)  Colchicine 0.6 Mg  Tabs (Colchicine) .... Take 2 Tablets By Mouth Once A Day 5)  Allopurinol 300 Mg  Tabs (Allopurinol) .... Take 1 Tablet By Mouth  Once A Day 6)  Amlodipine Besylate 10 Mg  Tabs (Amlodipine Besylate) .... Take 1 Tablet By Mouth Once A Day 7)  Accu-Chek Multiclix Lancets  Misc (Lancets) .... Use As Directed Four Times A Day 8)  Accu-Chek Aviva  Strp (Glucose Blood) .... Use As Directed Four Times A Day  Allergies: 1)  ! Demerol 2)  ! Codeine  CC: follow-up visit Pain Assessment Patient in pain? no      Nutritional Status BMI of > 30 = obese Nutritional Status Detail appetite is good per patient  Have you ever been in a relationship where you felt threatened, hurt or afraid?No   Does patient need assistance? Functional Status Self care Ambulation Normal        Medication Adherence: 07/05/2009   Adherence to medications reviewed with patient. Counseling to provide adequate adherence provided   Prevention For Positives: 07/05/2009   Safe sex practices discussed with patient. Condoms offered.                             Physical Exam  General:  alert, well-developed, and well-nourished.   Head:  normocephalic and atraumatic.   Eyes:  vision grossly intact, pupils equal, and pupils round.   Mouth:  fair dentition.   Neck:  supple.   Lungs:  normal respiratory effort and normal breath sounds.   Heart:  normal rate and regular rhythm.   Abdomen:  soft and non-tender.   Msk:  normal ROM and no joint swelling.   Neurologic:  alert & oriented X3 and cranial nerves II-XII intact.   Skin:  turgor normal and no rashes.   Psych:  Oriented X3.     Impression & Recommendations:  Problem # 1:  HIV DISEASE (ICD-042) Doing great.  His VL is consistently suppressed and CD4 over 300.  Encouraged compliance and told him what a great job he is doing.  Cont current meds RTC in one year. His updated medication list for this problem includes:    Atripla 600-200-300 Mg Tabs (Efavirenz-emtricitab-tenofovir) ..... One pill a day  Diagnostics Reviewed:  CD4: 360 (11/24/2007)   Hgb: 15.3 (11/23/2007)   HCT: 45.1  (11/23/2007)   HIV-1 RNA: <50 copies/mL (11/23/2007)   HBSAg: No (05/04/2006)  Diagnostics Reviewed:  HIV: HIV positive - not AIDS (12/06/2007)   CD4: 210 (06/21/2009)   WBC: 4.5 (06/21/2009)   Hgb: 14.6 (06/21/2009)   HCT: 43.9 (06/21/2009)   Platelets: 180 (06/21/2009) HIV-1 RNA: <48 copies/mL (06/21/2009)   HBSAg: No (05/04/2006)  Problem # 2:  HYPERTENSION (ICD-401.9) Follows with Dr Youlanda Mighty His updated medication list for this problem includes:    Micardis 80 Mg Tabs (Telmisartan) .Marland Kitchen... Take 1 tablet by mouth once a day    Coreg 25 Mg Tabs (Carvedilol) .Marland Kitchen... Take 1 tablet by mouth two times a day    Amlodipine Besylate 10 Mg Tabs (Amlodipine  besylate) .Marland Kitchen... Take 1 tablet by mouth once a day  BP today: 116/75 Prior BP: 99/63 (12/06/2007)  Labs Reviewed: K+: 4.8 (06/21/2009) Creat: : 0.91 (06/21/2009)   Chol: 139 (01/04/2007)   HDL: 27.7 (01/04/2007)   LDL: 83 (01/04/2007)   TG: 144 (01/04/2007)  Problem # 3:  HEMOCHROMATOSIS, HX OF (ICD-V12.3) FOllows with Dr Kinnie Scales in GI.  Q 8 wks phelbotomy  Other Orders: Est. Patient Level IV (16109) Future Orders: T-CD4SP ([facility]) (CD4SP) ... 06/30/2010 T-HIV Viral Load [id number]) ... 06/30/2010 T-CBC w/Diff ([id number]) ... 06/30/2010 T-RPR (Syphilis) [phone]) ... 06/30/2010 T-Lipid Profile [id number]) ... 06/30/2010 T-Comprehensive Metabolic Panel [phone]) ... 06/30/2010  Patient Instructions: 1)  Please schedule a follow-up appointment in 1 year. 2)  Be sure to return for lab work one (1) week before your next appointment as scheduled.  Process Orders Check Orders Results:     [facility]: ABN not required for this insurance Tests Sent for requisitioning (July 05, 2009 10:39 AM):     06/30/2010: [facility] -- T-HIV Viral Load [phone] (signed)     06/30/2010: [facility] -- T-CBC w/Diff [[id number]] (signed)     06/30/2010: [facility] --  T-RPR (Syphilis) [phone] (signed)     06/30/2010: [facility] -- T-Lipid Profile [phone] (signed)     06/30/2010: [facility] -- T-Comprehensive Metabolic Panel [phone] (signed)

## 2010-04-09 NOTE — Letter (Signed)
Summary: [facility]   Imported By: Maryln Gottron 08/25/2008 15:15:36  _____________________________________________________________________  External Attachment:    Type:   Image     Comment:   External Document

## 2010-04-09 NOTE — Progress Notes (Signed)
Summary: Office Visit  Office Visit   Imported By: Randon Goldsmith 09/09/2006 11:47:46  _____________________________________________________________________  External Attachment:    Type:   Image     Comment:   External Document

## 2010-04-11 NOTE — Procedures (Signed)
Summary: Colonoscopy Report/Dr. Sharrell Ku  Colonoscopy Report/Dr. Sharrell Ku   Imported By: Maryln Gottron 03/12/2010 10:39:25  _____________________________________________________________________  External Attachment:    Type:   Image     Comment:   External Document

## 2010-05-27 LAB — COMPREHENSIVE METABOLIC PANEL
ALT: 65 U/L — ABNORMAL HIGH (ref 0–53)
AST: 66 U/L — ABNORMAL HIGH (ref 0–37)
Albumin: 3.9 g/dL (ref 3.5–5.2)
Alkaline Phosphatase: 116 U/L (ref 39–117)
BUN: 18 mg/dL (ref 6–23)
CO2: 19 mEq/L (ref 19–32)
Calcium: 8.8 mg/dL (ref 8.4–10.5)
Chloride: 106 mEq/L (ref 96–112)
Creatinine, Ser: 0.94 mg/dL (ref 0.4–1.5)
GFR calc non Af Amer: >60 mL/min (ref >60)
Glucose, Bld: 174 mg/dL — ABNORMAL HIGH (ref 70–99)
Potassium: 3.6 mEq/L (ref 3.5–5.1)
Sodium: 134 mEq/L — ABNORMAL LOW (ref 135–145)
Total Bilirubin: 0.9 mg/dL (ref 0.3–1.2)
Total Protein: 7.1 g/dL (ref 6.0–8.3)

## 2010-05-27 LAB — CBC
HCT: 40.9 % (ref 39.0–52.0)
Hemoglobin: 14.7 g/dL (ref 13.0–17.0)
MCHC: 35.8 g/dL (ref 30.0–36.0)
MCV: 98.3 fL (ref 78.0–100.0)
Platelets: 171 10*3/uL (ref 150–400)
RBC: 4.16 MIL/uL — ABNORMAL LOW (ref 4.22–5.81)
RDW: 13.2 % (ref 11.5–15.5)
WBC: 6.9 10*3/uL (ref 4.0–10.5)

## 2010-05-27 LAB — BILIRUBIN, DIRECT: Bilirubin, Direct: 0.2 mg/dL (ref 0.0–0.3)

## 2010-05-27 LAB — IRON AND TIBC
Iron: 186 ug/dL — ABNORMAL HIGH (ref 42–135)
Saturation Ratios: 55 % (ref 20–55)
TIBC: 341 ug/dL (ref 215–435)
UIBC: 155 ug/dL

## 2010-05-27 LAB — HEMOGLOBIN A1C
Hgb A1c MFr Bld: 6.8 % — ABNORMAL HIGH (ref <5.7)
Mean Plasma Glucose: 148 mg/dL — ABNORMAL HIGH (ref <117)

## 2010-05-27 LAB — FERRITIN: Ferritin: 128 ng/mL (ref 22–322)

## 2010-05-27 LAB — AFP TUMOR MARKER

## 2010-05-29 LAB — T-HELPER CELL (CD4) - (RCID CLINIC ONLY)
CD4 % Helper T Cell: 25 % — ABNORMAL LOW (ref 33–55)
CD4 T Cell Abs: 210 uL — ABNORMAL LOW (ref 400–2700)

## 2010-06-14 LAB — CBC
HCT: 41.8 % (ref 39.0–52.0)
Hemoglobin: 14.8 g/dL (ref 13.0–17.0)
MCHC: 35.3 g/dL (ref 30.0–36.0)
MCV: 97.9 fL (ref 78.0–100.0)
Platelets: 190 10*3/uL (ref 150–400)
RBC: 4.27 MIL/uL (ref 4.22–5.81)
RDW: 13.2 % (ref 11.5–15.5)
WBC: 5.8 10*3/uL (ref 4.0–10.5)

## 2010-06-14 LAB — IRON AND TIBC
Iron: 151 ug/dL — ABNORMAL HIGH (ref 42–135)
Saturation Ratios: 41 % (ref 20–55)
TIBC: 370 ug/dL (ref 215–435)
UIBC: 219 ug/dL

## 2010-06-14 LAB — FERRITIN: Ferritin: 50 ng/mL (ref 22–322)

## 2010-06-18 LAB — BASIC METABOLIC PANEL
BUN: 14 mg/dL (ref 6–23)
BUN: 15 mg/dL (ref 6–23)
BUN: 17 mg/dL (ref 6–23)
CO2: 18 mEq/L — ABNORMAL LOW (ref 19–32)
CO2: 19 mEq/L (ref 19–32)
CO2: 23 mEq/L (ref 19–32)
Calcium: 8.8 mg/dL (ref 8.4–10.5)
Calcium: 9.1 mg/dL (ref 8.4–10.5)
Calcium: 9.3 mg/dL (ref 8.4–10.5)
Chloride: 105 mEq/L (ref 96–112)
Chloride: 109 mEq/L (ref 96–112)
Chloride: 111 mEq/L (ref 96–112)
Creatinine, Ser: 0.97 mg/dL (ref 0.4–1.5)
Creatinine, Ser: 1.01 mg/dL (ref 0.4–1.5)
Creatinine, Ser: 1.06 mg/dL (ref 0.4–1.5)
GFR calc Af Amer: >60 mL/min (ref >60)
GFR calc Af Amer: >60 mL/min (ref >60)
GFR calc Af Amer: >60 mL/min (ref >60)
GFR calc non Af Amer: >60 mL/min (ref >60)
GFR calc non Af Amer: >60 mL/min (ref >60)
GFR calc non Af Amer: >60 mL/min (ref >60)
Glucose, Bld: 111 mg/dL — ABNORMAL HIGH (ref 70–99)
Glucose, Bld: 124 mg/dL — ABNORMAL HIGH (ref 70–99)
Glucose, Bld: 124 mg/dL — ABNORMAL HIGH (ref 70–99)
Potassium: 3.6 mEq/L (ref 3.5–5.1)
Potassium: 3.7 mEq/L (ref 3.5–5.1)
Potassium: 3.9 mEq/L (ref 3.5–5.1)
Sodium: 138 mEq/L (ref 135–145)
Sodium: 140 mEq/L (ref 135–145)
Sodium: 142 mEq/L (ref 135–145)

## 2010-06-18 LAB — CBC
HCT: 38.8 % — ABNORMAL LOW (ref 39.0–52.0)
HCT: 39.6 % (ref 39.0–52.0)
HCT: 42.9 % (ref 39.0–52.0)
HCT: 43.2 % (ref 39.0–52.0)
Hemoglobin: 13.8 g/dL (ref 13.0–17.0)
Hemoglobin: 14.1 g/dL (ref 13.0–17.0)
Hemoglobin: 14.9 g/dL (ref 13.0–17.0)
Hemoglobin: 15.2 g/dL (ref 13.0–17.0)
MCHC: 34.6 g/dL (ref 30.0–36.0)
MCHC: 35.1 g/dL (ref 30.0–36.0)
MCHC: 35.5 g/dL (ref 30.0–36.0)
MCHC: 35.6 g/dL (ref 30.0–36.0)
MCV: 94.8 fL (ref 78.0–100.0)
MCV: 95.5 fL (ref 78.0–100.0)
MCV: 95.8 fL (ref 78.0–100.0)
MCV: 95.8 fL (ref 78.0–100.0)
Platelets: 203 10*3/uL (ref 150–400)
Platelets: 203 10*3/uL (ref 150–400)
Platelets: 210 10*3/uL (ref 150–400)
Platelets: 218 10*3/uL (ref 150–400)
RBC: 4.06 MIL/uL — ABNORMAL LOW (ref 4.22–5.81)
RBC: 4.13 MIL/uL — ABNORMAL LOW (ref 4.22–5.81)
RBC: 4.51 MIL/uL (ref 4.22–5.81)
RBC: 4.53 MIL/uL (ref 4.22–5.81)
RDW: 14.3 % (ref 11.5–15.5)
RDW: 14.4 % (ref 11.5–15.5)
RDW: 14.7 % (ref 11.5–15.5)
RDW: 14.7 % (ref 11.5–15.5)
WBC: 5.4 10*3/uL (ref 4.0–10.5)
WBC: 6.7 10*3/uL (ref 4.0–10.5)
WBC: 7.2 10*3/uL (ref 4.0–10.5)
WBC: 9.4 10*3/uL (ref 4.0–10.5)

## 2010-06-18 LAB — APTT: aPTT: 27 seconds (ref 24–37)

## 2010-06-18 LAB — POCT I-STAT, CHEM 8
BUN: 16 mg/dL (ref 6–23)
Calcium, Ion: 1.2 mmol/L (ref 1.12–1.32)
Chloride: 109 mEq/L (ref 96–112)
Creatinine, Ser: 0.9 mg/dL (ref 0.4–1.5)
Glucose, Bld: 135 mg/dL — ABNORMAL HIGH (ref 70–99)
HCT: 39 % (ref 39.0–52.0)
Hemoglobin: 13.3 g/dL (ref 13.0–17.0)
Potassium: 4 mEq/L (ref 3.5–5.1)
Sodium: 136 mEq/L (ref 135–145)
TCO2: 17 mmol/L (ref 0–100)

## 2010-06-18 LAB — GLUCOSE, CAPILLARY
Glucose-Capillary: 104 mg/dL — ABNORMAL HIGH (ref 70–99)
Glucose-Capillary: 106 mg/dL — ABNORMAL HIGH (ref 70–99)
Glucose-Capillary: 112 mg/dL — ABNORMAL HIGH (ref 70–99)
Glucose-Capillary: 119 mg/dL — ABNORMAL HIGH (ref 70–99)
Glucose-Capillary: 119 mg/dL — ABNORMAL HIGH (ref 70–99)
Glucose-Capillary: 120 mg/dL — ABNORMAL HIGH (ref 70–99)
Glucose-Capillary: 122 mg/dL — ABNORMAL HIGH (ref 70–99)
Glucose-Capillary: 128 mg/dL — ABNORMAL HIGH (ref 70–99)
Glucose-Capillary: 84 mg/dL (ref 70–99)

## 2010-06-18 LAB — POCT I-STAT 3, ART BLOOD GAS (G3+)
Acid-base deficit: 6 mmol/L — ABNORMAL HIGH (ref 0.0–2.0)
Bicarbonate: 17.6 mEq/L — ABNORMAL LOW (ref 20.0–24.0)
O2 Saturation: 98 %
TCO2: 19 mmol/L (ref 0–100)
pCO2 arterial: 29.7 mmHg — ABNORMAL LOW (ref 35.0–45.0)
pH, Arterial: 7.382 (ref 7.350–7.450)
pO2, Arterial: 97 mmHg (ref 80.0–100.0)

## 2010-06-18 LAB — IRON AND TIBC
Iron: 179 ug/dL — ABNORMAL HIGH (ref 42–135)
Saturation Ratios: 47 % (ref 20–55)
TIBC: 381 ug/dL (ref 215–435)
UIBC: 202 ug/dL

## 2010-06-18 LAB — HEMOGLOBIN A1C
Hgb A1c MFr Bld: 5.4 % (ref 4.6–6.1)
Mean Plasma Glucose: 108 mg/dL

## 2010-06-18 LAB — CK TOTAL AND CKMB (NOT AT ARMC)
CK, MB: 1.5 ng/mL (ref 0.3–4.0)
CK, MB: 1.6 ng/mL (ref 0.3–4.0)
Relative Index: INVALID (ref 0.0–2.5)
Relative Index: INVALID (ref 0.0–2.5)
Total CK: 92 U/L (ref 7–232)
Total CK: 95 U/L (ref 7–232)

## 2010-06-18 LAB — D-DIMER, QUANTITATIVE
D-Dimer, Quant: <0.22 ug/mL-FEU (ref 0.00–0.48)
D-Dimer, Quant: <0.22 ug/mL-FEU (ref 0.00–0.48)

## 2010-06-18 LAB — LIPID PANEL
Cholesterol: 167 mg/dL (ref 0–200)
HDL: 31 mg/dL — ABNORMAL LOW (ref >39)
LDL Cholesterol: 64 mg/dL (ref 0–99)
Total CHOL/HDL Ratio: 5.4 RATIO
Triglycerides: 360 mg/dL — ABNORMAL HIGH (ref <150)
VLDL: 72 mg/dL — ABNORMAL HIGH (ref 0–40)

## 2010-06-18 LAB — HEPARIN LEVEL (UNFRACTIONATED)
Heparin Unfractionated: 0.25 IU/mL — ABNORMAL LOW (ref 0.30–0.70)
Heparin Unfractionated: 0.35 IU/mL (ref 0.30–0.70)
Heparin Unfractionated: 0.53 IU/mL (ref 0.30–0.70)
Heparin Unfractionated: <0.1 IU/mL — ABNORMAL LOW (ref 0.30–0.70)

## 2010-06-18 LAB — CARDIAC PANEL(CRET KIN+CKTOT+MB+TROPI)
CK, MB: 1.8 ng/mL (ref 0.3–4.0)
Relative Index: INVALID (ref 0.0–2.5)
Total CK: 98 U/L (ref 7–232)
Troponin I: 0.01 ng/mL (ref 0.00–0.06)

## 2010-06-18 LAB — PROTIME-INR
INR: 1 (ref 0.00–1.49)
Prothrombin Time: 13.5 seconds (ref 11.6–15.2)

## 2010-06-18 LAB — TROPONIN I
Troponin I: 0.02 ng/mL (ref 0.00–0.06)
Troponin I: 0.02 ng/mL (ref 0.00–0.06)

## 2010-06-18 LAB — TSH: TSH: 1.5 u[IU]/mL (ref 0.350–4.500)

## 2010-06-18 LAB — BRAIN NATRIURETIC PEPTIDE: Pro B Natriuretic peptide (BNP): <30 pg/mL (ref 0.0–100.0)

## 2010-07-23 NOTE — Cardiovascular Report (Signed)
[name], [name]             ACCOUNT NO.:  [id number]   MEDICAL RECORD NO.:  [id number]          PATIENT TYPE:  INP   LOCATION:  2906                         FACILITY:  [facility]   PHYSICIAN:  Verne Carrow, MDDATE OF BIRTH:  October 19, 1950   DATE OF PROCEDURE:  08/04/2008  DATE OF DISCHARGE:                            CARDIAC CATHETERIZATION   PROCEDURE PERFORMED:  1. Left heart catheterization.  2. Selective coronary angiography.  3. Left ventricular angiogram.  4. Placement of an Angio-Seal femoral artery closure device.   OPERATOR:  Verne Carrow, MD   INDICATIONS:  Acute onset of shortness of breath in a 60 year old  patient with a history of diabetes mellitus, hypertension,  hemochromatosis, and HIV, who is here in our short-stay area today  receiving phlebotomy.  He had acute onset of shortness of breath while  having his phlebotomy.  He denies any chest pain.  His EKG was  concerning for subtle inferior ST elevation.  The patient was brought  emergently to the cardiac catheterization laboratory for cardiac  catheterization.   DETAILS OF PROCEDURE:  The patient was brought to the inpatient cardiac  catheterization laboratory after signing informed consent.  The right  groin was prepped and draped in a sterile fashion.  Lidocaine 1% was  used for local anesthesia.  Standard diagnostic catheters were used to  perform selective coronary angiography through a 6-French sheath.  A 5-  French pigtail catheter was used to perform a left ventricular  angiogram.  The patient tolerated the procedure well.  The patient was  complaining of significant shortness of breath on the cath table despite  having normal blood pressure, heart rate in the low 90s, and an oxygen  saturation of 98%.  He was taken emergently to the CT scanner for a CT  angiogram to rule out pulmonary embolus.   HEMODYNAMIC FINDINGS:  Central aortic pressure 95/71.  Left ventricular  pressure 90/10.   Left ventricular end-diastolic pressure 14.   ANGIOGRAPHIC DATA:  1. The left main coronary artery had no evidence of disease.  2. The left anterior descending coronary artery is a moderate to large      sized vessel that coursed to the apex and gives off a moderate-      sized diagonal branch in the mid vessel.  There is evident bridging      in the mid LAD, but no obstructive lesions.  There is sluggish flow      down the distal LAD and a diagonal branch.  I do not see any      apparent thrombus.  3. The circumflex artery gives off a small first marginal branch and a      large bifurcating second marginal branch.  There is no evidence of      disease in this vessel.  The flow is slightly sluggish throughout      this vessel.  4. The right coronary artery is a large dominant vessel that has      sluggish flow throughout the entire vessel.  It bifurcates into a      posterior descending artery and a posterolateral  branch.  There is      a mild 40% stenosis and a small branch off the posterolateral      branch.  There is no obstructive disease noted in the major      epicardial vessels in the right coronary artery.  5. Left ventricular angiogram was performed in the RAO projection      shows normal left ventricular systolic function.  There are no wall      motion abnormalities.  Ejection fraction 65%.   IMPRESSION:  1. No obstructive coronary artery disease.  2. Abnormal filling pattern in both the right and left coronary      distribution.  3. Normal left ventricular systolic function.   RECOMMENDATIONS:  I cannot explain the patient's severe shortness of  breath based on the findings of his coronary angiography or left  ventricular angiogram.  There is concern for pulmonary embolus.  The  patient will be taken for a stat CT angiogram to rule out pulmonary  embolus.  We will also get an echocardiogram after his CT scan.  We will  start a heparin drip here in the cath lab.  Of  note, his right femoral  artery was sealed with an Angio-Seal femoral artery closure device.  Basic laboratory values have been sent including a metabolic profile,  CBC, D-dimer, BNP, and coagulation study.  Further plans to follow.      Verne Carrow, MD  Electronically Signed     CM/MEDQ  D:  08/04/2008  T:  08/05/2008  Job:  [id number]

## 2010-07-23 NOTE — H&P (Signed)
NAMEJIAIRE, [name]             ACCOUNT NO.:  [id number]   MEDICAL RECORD NO.:  [id number]          PATIENT TYPE:  INP   LOCATION:  2906                         FACILITY:  [facility]   PHYSICIAN:  Verne Carrow, MDDATE OF BIRTH:  12-19-1950   DATE OF ADMISSION:  08/04/2008  DATE OF DISCHARGE:                              HISTORY & PHYSICAL   PRIMARY CARE PHYSICIAN:  Ollen Gross. Vernell Morgans, MD   PRIMARY CARDIOLOGIST:  New, will be Verne Carrow, MD   CHIEF COMPLAINT:  1. Weakness.  2. Shortness of breath.   HISTORY OF PRESENT ILLNESS:  [name] is a 60 year old male with no  previous history of coronary artery disease.  He was here in the  hospital in a short stay section for routine phlebotomy, secondary to  hemochromatosis.  He had sudden onset of shortness of breath and also  complained of weakness, diaphoresis, and possible nausea with this.  He  denies chest pain, but seems to have trouble taking a deep breath.  He  was also hypotensive.  He was given IV fluids and his symptoms improved  somewhat, but he still appears acutely uncomfortable.  He is still  diaphoretic and very weak.  Of note, he says he exercises regularly and  has not ever had symptoms such as this before.   PAST MEDICAL HISTORY:  1. Gilbert syndrome.  2. Diabetes.  3. Hypertension.  4. Obesity.  5. History of HIV.  6. History of gout.  7. Hemochromatosis.  8. Chronic kidney disease, stage I-II.   SURGICAL HISTORY:  He is status post nephrectomy, adrenalectomy, and  cholecystectomy.   ALLERGIES:  He is allergic or intolerant to:  1. DEMEROL.  2. CODEINE.   CURRENT MEDICATIONS:  Need to be updated, but currently include:  1. Atripla 600 - 200 - 300 mg tablets daily.  2. Micardis 80 mg daily.  3. Coreg 25 mg b.i.d.  4. Colchicine 0.6 daily.  5. Allopurinol 300 mg daily.  6. Amlodipine 10 mg daily.   SOCIAL HISTORY:  Lives in [place] alone, works in [employer].  There is no  history of alcohol, tobacco, or drug abuse.   FAMILY HISTORY:  Unobtainable.   REVIEW OF SYSTEMS:  Significant for the symptoms he is currently  experiencing with weakness, diaphoresis, and shortness of breath, but  otherwise essentially negative by brief questioning.   PHYSICAL EXAMINATION:  GENERAL:  He is a well-developed, slightly obese  white male, in acute distress.  HEENT:  Normal.  NECK:  There is no lymphadenopathy, thyromegaly, bruit, or JVD noted.  CVA:  His heart is regular in rate and rhythm with an S1-S2 and no  significant murmur, rub, or gallop is noted.  Distal pulses are intact  in all 4 extremities and no femoral bruits are appreciated.  LUNGS:  Essentially clear to auscultation bilaterally.  SKIN:  No rashes or lesions are noted.  ABDOMEN:  Soft and nontender with active bowel sounds.  EXTREMITIES:  There is no cyanosis, clubbing, or edema noted.  MUSCULOSKELETAL:  There is no joint deformity or effusion, and no spine  or CVA  tenderness.  NEUROLOGIC:  He is alert and oriented with cranial nerves II through XII  grossly intact.   Chest x-ray is pending.   EKG is sinus tachycardia with questionable ST elevation in the lateral  leads.   Laboratory values are pending.   IMPRESSION:  [name] is a 59 year old male with no previous history  of coronary artery disease.  His symptoms are very concerning for an  acute coronary syndrome.  He is being taken directly to the cath lab and  further evaluation and treatment will depend on the results of the above  testing.  We will continue his home medications and check a lipid  profile with TSH and hemoglobin A1c.      Theodore Demark, PA-C      Verne Carrow, MD  Electronically Signed    RB/MEDQ  D:  08/04/2008  T:  08/05/2008  Job:  [id number]

## 2010-07-23 NOTE — Discharge Summary (Signed)
[name], [name]             ACCOUNT NO.:  [id number]   MEDICAL RECORD NO.:  [id number]          PATIENT TYPE:  INP   LOCATION:  3739                         FACILITY:  [facility]   PHYSICIAN:  Jonelle Sidle, MD DATE OF BIRTH:  04-09-1950   DATE OF ADMISSION:  08/04/2008  DATE OF DISCHARGE:  08/06/2008                               DISCHARGE SUMMARY   PRIMARY CARDIOLOGIST:  Verne Carrow, MD   PRIMARY CARE PHYSICIAN:  Valetta Mole. Swords, MD   DISCHARGE DIAGNOSES:  1. Shortness of breath/weakness of uncertain etiology, resolved.  2. Nonobstructive coronary artery disease.   SECONDARY DIAGNOSES:  1. Gilbert syndrome.  2. Diabetes mellitus, type 2.  3. Hypertension.  4. Obesity.  5. Human immunodeficiency virus.  6. History of gout.  7. Hemochromatosis.  8. Chronic kidney disease, stage I-II.   ALLERGIES:  MEPERIDINE and CODEINE.   PROCEDURES:  1. EKG showing normal sinus rhythm at a rate of 95 with subtle ST      elevation in inferior lead as well as in V2, however, no T-wave      inversion is noted.  No other acute ST-T wave changes.      Questionably significant Q waves in inferior leads.  Normal axis.      No evidence of hypertrophy.  PR 194, QRS 82, and QTc 409.  2. Cardiac catheterization (see full report):      a.     Nonobstructive coronary artery disease.      b.     Abnormal filling pattern (sluggish flow - question       endothelial dysfunction).      c.     Normal left ventricular systolic function.  3. CT angiogram of the chest with contrast that showed:      a.     No evidence of pulmonary embolus.      b.     Minimal patchy left upper lobe ground glass densities.  It       could represent areas of atelectasis  or alveolitis.      c.     Heart borderline in size.  4. A 2-D echocardiogram that was completed Aug 04, 2008.  It showed      left ventricular cavity size was normal, wall thickness was      increased in pattern of mild left ventricular  hypertrophy, ejection      fraction was 67%, wall motion was normal.  5. A chest x-ray completed Aug 06, 2008, showing no active      cardiopulmonary disease.   HISTORY OF PRESENT ILLNESS:  [name] is a 60 year old male with no  previous history of CAD.  He was at [facility] in the short-  stay section for routine phlebotomy secondary to hemochromatosis.  He  had sudden onset of shortness of breath and also complained of weakness,  diaphoresis, and nausea.  He denied chest pain, but had trouble taking a  deep breath.  He was also relatively hypotensive.  He was given IV  fluids and his symptoms improved somewhat, but he still appeared  uncomfortable.  He was still diaphoretic and weak.  Of note, he reports  exercising regularly without symptoms.  He was seen by cardiology.   HOSPITAL COURSE:  The patient was admitted and underwent procedures as  described above.  He had no enzymatic evidence of an acute coronary  syndrome, no arrhythmia, no fevers or cough, and largely normal labs.  No clear etiology was identified to explain his symptomatology.  The  patient was monitored and deemed stable for discharge on Aug 06, 2008.  The patient had no recurrent symptomatology during his hospital course.  Vital signs remained stable during hospital course.   MOST RECENT VITAL SIGNS ON DATE OF DISCHARGE:  Temp 98.5 degrees  Fahrenheit, BP 115/68, pulse 63, respiration rate 16, and O2 saturation  98% on room air.   The patient has been instructed to follow up with his primary care  physician in 2-3 weeks.  At the time of the discharge, the patient will  be given a copy of his new medication list, prescriptions, followup  instructions, and postop instructions.  At that time, all of his  questions or concerns should be addressed.   DISCHARGE LABORATORY DATA:  WBC 7.2, HGB 14.1, HCT 39.6, and PLT count  218.  Sodium 142, potassium 3.7, chloride 111, CO2 23, BUN 17,  creatinine 1.06,  glucose 111, and calcium 9.3.  Hemoglobin A1c 5.4.  The  patient had 3 sets of negative cardiac enzymes.  Total cholesterol 167,  triglycerides 360, HDL 31, LDL 64, and VLDL 72.  TSH 1.5.  BNP was less  than 30.0.   FOLLOWUP PLANS AND APPOINTMENTS:  Dr. Cato Mulligan, the patient instructed to  schedule appointment in 2-3 weeks.   DISCHARGE MEDICATIONS:  1. Atripla 600/200/300 p.o. nightly.  2. Micardis 80 mg p.o. daily.  3. Coreg 25 mg p.o. b.i.d.  4. Colchicine 0.6 mg p.o. b.i.d.  5. Allopurinol 300 mg p.o. daily.  6. Norvasc 10 mg p.o. daily.  7. Protonix 40 mg p.o. daily.  8. Enteric-coated aspirin 81 mg p.o. daily.   DURATION OF DISCHARGE ENCOUNTER INCLUDING PHYSICIAN TIME:  40 minutes.      Jarrett Ables, [facility]      Jonelle Sidle, MD  Electronically Signed    MS/MEDQ  D:  08/06/2008  T:  08/07/2008  Job:  [id number]   cc:   Verne Carrow, MD  Valetta Mole. Swords, MD

## 2010-08-07 ENCOUNTER — Other Ambulatory Visit: Payer: Self-pay | Admitting: Licensed Clinical Social Worker

## 2010-08-07 ENCOUNTER — Other Ambulatory Visit: Payer: Self-pay | Admitting: *Deleted

## 2010-08-07 DIAGNOSIS — B20 Human immunodeficiency virus [HIV] disease: Secondary | ICD-10-CM

## 2010-08-07 MED ORDER — EFAVIRENZ-EMTRICITAB-TENOFOVIR 600-200-300 MG PO TABS: 1.0000 | ORAL_TABLET | Freq: Every day | ORAL | Status: DC

## 2010-08-14 ENCOUNTER — Other Ambulatory Visit: Payer: Self-pay | Admitting: Internal Medicine

## 2010-08-19 ENCOUNTER — Encounter: Payer: Self-pay | Admitting: Internal Medicine

## 2010-08-20 ENCOUNTER — Encounter: Payer: Self-pay | Admitting: Internal Medicine

## 2010-08-20 ENCOUNTER — Ambulatory Visit (INDEPENDENT_AMBULATORY_CARE_PROVIDER_SITE_OTHER): Payer: 59 | Admitting: Internal Medicine

## 2010-08-20 DIAGNOSIS — B20 Human immunodeficiency virus [HIV] disease: Secondary | ICD-10-CM

## 2010-08-20 DIAGNOSIS — IMO0001 Reserved for inherently not codable concepts without codable children: Secondary | ICD-10-CM

## 2010-08-20 DIAGNOSIS — I10 Essential (primary) hypertension: Secondary | ICD-10-CM

## 2010-08-20 MED ORDER — METFORMIN HCL 500 MG PO TABS: 500.0000 mg | ORAL_TABLET | Freq: Two times a day (BID) | ORAL | Status: DC

## 2010-08-20 NOTE — Progress Notes (Signed)
  Subjective:    Patient ID: [name], male    DOB: 1950-12-11, 60 y.o.   MRN: [id number]  HPI   patient comes in for followup of multiple medical problems including type 2 diabetes, hyperlipidemia, hypertension. The patient does not check blood sugar or blood pressure at home. The patetient does not follow an exercise or diet program. The patient denies any polyuria, polydipsia.  In the past the patient has gone to diabetic treatment center. The patient is tolerating medications  Without difficulty. The patient does admit to medication compliance. I have not seen patient in several years.   He admits to significant fatigue, may have some daytime hypersomnolence.   Past Medical History  Diagnosis Date  . HIV disease 1990  . Hypertension   . HEMOCHROMATOSIS, HX OF 12/15/2005  . GOUT 06/22/2006  . GILBERT'S SYNDROME 02/25/2007  . DIABETES MELLITUS, TYPE II, UNCONTROLLED 06/22/2006   Past Surgical History  Procedure Date  . Cholecystectomy   . Adrenalectomy     reports that he has never smoked. He does not have any smokeless tobacco history on file. He reports that he does not drink alcohol or use illicit drugs. family history includes Atrial fibrillation in his father and mother; Hyperlipidemia in his father and mother; and Hypertension in his father and mother. Allergies  Allergen Reactions  . Codeine   . Meperidine Hcl    Review of Systems  patient denies chest pain, shortness of breath, orthopnea. Denies lower extremity edema, abdominal pain, change in appetite, change in bowel movements. Patient denies rashes, musculoskeletal complaints. No other specific complaints in a complete review of systems.      Objective:   Physical Exam  well-developed well-nourished male in no acute distress. HEENT exam atraumatic, normocephalic, neck supple without jugular venous distention. Chest clear to auscultation cardiac exam S1-S2 are regular. Abdominal exam overweight with bowel sounds,  soft and nontender. Extremities no edema. Neurologic exam is alert with a normal gait.        Assessment & Plan:

## 2010-08-29 NOTE — Assessment & Plan Note (Signed)
Well controlled. Continue current medications  

## 2010-08-29 NOTE — Assessment & Plan Note (Signed)
Followup at the infectious disease clinic. He is compliant with his medications.

## 2010-08-29 NOTE — Assessment & Plan Note (Signed)
Poor control. Reviewed outside laboratory work. We will monitor blood sugars. His blood sugar recently been less than 200. He would like to address his diabetes with diet and exercise. I advised low-calorie diet, progressive weight loss and regular exercise. He will follow up with me in one month.

## 2010-09-13 ENCOUNTER — Ambulatory Visit: Payer: 59 | Admitting: Internal Medicine

## 2010-09-16 ENCOUNTER — Ambulatory Visit: Payer: 59 | Admitting: Internal Medicine

## 2010-10-28 ENCOUNTER — Ambulatory Visit: Payer: 59 | Admitting: Internal Medicine

## 2010-11-08 ENCOUNTER — Encounter: Payer: Self-pay | Admitting: Internal Medicine

## 2010-11-08 ENCOUNTER — Ambulatory Visit (INDEPENDENT_AMBULATORY_CARE_PROVIDER_SITE_OTHER): Payer: 59 | Admitting: Internal Medicine

## 2010-11-08 DIAGNOSIS — B20 Human immunodeficiency virus [HIV] disease: Secondary | ICD-10-CM

## 2010-11-08 DIAGNOSIS — I10 Essential (primary) hypertension: Secondary | ICD-10-CM

## 2010-11-08 DIAGNOSIS — M109 Gout, unspecified: Secondary | ICD-10-CM

## 2010-11-08 DIAGNOSIS — IMO0001 Reserved for inherently not codable concepts without codable children: Secondary | ICD-10-CM

## 2010-11-08 LAB — BASIC METABOLIC PANEL
BUN: 18 mg/dL (ref 6–23)
CO2: 24 mEq/L (ref 19–32)
Calcium: 9.4 mg/dL (ref 8.4–10.5)
Chloride: 105 mEq/L (ref 96–112)
Creatinine, Ser: 1 mg/dL (ref 0.4–1.5)
GFR: 80.46 mL/min (ref >60.00)
Glucose, Bld: 176 mg/dL — ABNORMAL HIGH (ref 70–99)
Potassium: 4.9 mEq/L (ref 3.5–5.1)
Sodium: 139 mEq/L (ref 135–145)

## 2010-11-08 LAB — HEPATIC FUNCTION PANEL
ALT: 74 U/L — ABNORMAL HIGH (ref 0–53)
AST: 89 U/L — ABNORMAL HIGH (ref 0–37)
Albumin: 4.5 g/dL (ref 3.5–5.2)
Alkaline Phosphatase: 118 U/L — ABNORMAL HIGH (ref 39–117)
Bilirubin, Direct: 0.2 mg/dL (ref 0.0–0.3)
Total Bilirubin: 1.2 mg/dL (ref 0.3–1.2)
Total Protein: 8 g/dL (ref 6.0–8.3)

## 2010-11-08 LAB — CBC WITH DIFFERENTIAL/PLATELET
Basophils Absolute: 0 10*3/uL (ref 0.0–0.1)
Basophils Relative: 0.6 % (ref 0.0–3.0)
Eosinophils Absolute: 0.2 10*3/uL (ref 0.0–0.7)
Eosinophils Relative: 3.2 % (ref 0.0–5.0)
HCT: 43.8 % (ref 39.0–52.0)
Hemoglobin: 15.1 g/dL (ref 13.0–17.0)
Lymphocytes Relative: 38.4 % (ref 12.0–46.0)
Lymphs Abs: 2.6 10*3/uL (ref 0.7–4.0)
MCHC: 34.5 g/dL (ref 30.0–36.0)
MCV: 101.9 fl — ABNORMAL HIGH (ref 78.0–100.0)
Monocytes Absolute: 0.6 10*3/uL (ref 0.1–1.0)
Monocytes Relative: 8.6 % (ref 3.0–12.0)
Neutro Abs: 3.3 10*3/uL (ref 1.4–7.7)
Neutrophils Relative %: 49.2 % (ref 43.0–77.0)
Platelets: 205 10*3/uL (ref 150.0–400.0)
RBC: 4.29 Mil/uL (ref 4.22–5.81)
RDW: 13.1 % (ref 11.5–14.6)
WBC: 6.7 10*3/uL (ref 4.5–10.5)

## 2010-11-08 LAB — TSH: TSH: 1.57 u[IU]/mL (ref 0.35–5.50)

## 2010-11-08 LAB — LIPID PANEL
Cholesterol: 182 mg/dL (ref 0–200)
HDL: 40.2 mg/dL (ref >39.00)
Total CHOL/HDL Ratio: 5
Triglycerides: 806 mg/dL — ABNORMAL HIGH (ref 0.0–149.0)
VLDL: 161.2 mg/dL — ABNORMAL HIGH (ref 0.0–40.0)

## 2010-11-08 LAB — LDL CHOLESTEROL, DIRECT: Direct LDL: 55.9 mg/dL

## 2010-11-08 LAB — HEMOGLOBIN A1C: Hgb A1c MFr Bld: 7 % — ABNORMAL HIGH (ref 4.6–6.5)

## 2010-11-08 NOTE — Assessment & Plan Note (Signed)
Needs f/u Check labs today 

## 2010-11-08 NOTE — Progress Notes (Signed)
  Subjective:    Patient ID: ANKITH EDMONSTON, male    DOB: 04-09-1950, 60 y.o.   MRN: [id number]  HPI  patient comes in for followup of multiple medical problems including type 2 diabetes, hyperlipidemia, hypertension. The patient does not check blood sugar or blood pressure at home. The patetient does not follow an exercise or diet program. The patient denies any polyuria, polydipsia.  In the past the patient has gone to diabetic treatment center. The patient is tolerating medications  Without difficulty. The patient does admit to medication compliance.   Past Medical History  Diagnosis Date  . HIV disease 1990  . Hypertension   . HEMOCHROMATOSIS, HX OF 12/15/2005  . GOUT 06/22/2006  . GILBERT'S SYNDROME 02/25/2007  . DIABETES MELLITUS, TYPE II, UNCONTROLLED 06/22/2006   Past Surgical History  Procedure Date  . Cholecystectomy   . Adrenalectomy     reports that he has never smoked. He does not have any smokeless tobacco history on file. He reports that he does not drink alcohol or use illicit drugs. family history includes Atrial fibrillation in his father and mother; Hyperlipidemia in his father and mother; and Hypertension in his father and mother. Allergies  Allergen Reactions  . Codeine   . Meperidine Hcl     Review of Systems  patient denies chest pain, shortness of breath, orthopnea. Denies lower extremity edema, abdominal pain, change in appetite, change in bowel movements. Patient denies rashes, musculoskeletal complaints. No other specific complaints in a complete review of systems.      Objective:   Physical Exam   well-developed well-nourished male in no acute distress. HEENT exam atraumatic, normocephalic, neck supple without jugular venous distention. Chest clear to auscultation cardiac exam S1-S2 are regular. Abdominal exam overweight with bowel sounds, soft and nontender. Extremities no edema. Neurologic exam is alert with a normal gait.       Assessment & Plan:

## 2010-11-08 NOTE — Assessment & Plan Note (Signed)
Well controlled. Continue current meds

## 2010-11-08 NOTE — Assessment & Plan Note (Signed)
No recurrence He will start to taper colchicine

## 2010-11-08 NOTE — Assessment & Plan Note (Signed)
Has f/u with dr Daiva Eves

## 2010-12-09 LAB — IRON AND TIBC
Iron: 321 — ABNORMAL HIGH
Saturation Ratios: 84 — ABNORMAL HIGH
TIBC: 383
UIBC: 62

## 2010-12-09 LAB — AFP TUMOR MARKER: AFP-Tumor Marker: 3.4

## 2010-12-09 LAB — T-HELPER CELL (CD4) - (RCID CLINIC ONLY)
CD4 % Helper T Cell: 20 — ABNORMAL LOW
CD4 T Cell Abs: 360 — ABNORMAL LOW

## 2010-12-09 LAB — HEMOGLOBIN AND HEMATOCRIT, BLOOD
HCT: 42.2
Hemoglobin: 14.7

## 2010-12-11 LAB — IRON AND TIBC
Iron: 144 — ABNORMAL HIGH
Saturation Ratios: 39
TIBC: 370
UIBC: 226

## 2010-12-11 LAB — FERRITIN: Ferritin: 53 (ref 22–322)

## 2010-12-11 LAB — CBC
HCT: 42.4
Hemoglobin: 14.9
MCHC: 35.1
MCV: 97.5
Platelets: 243
RBC: 4.35
RDW: 13.5
WBC: 7.2

## 2010-12-13 LAB — CBC
HCT: 42.9 % (ref 39.0–52.0)
Hemoglobin: 14.6 g/dL (ref 13.0–17.0)
MCHC: 33.9 g/dL (ref 30.0–36.0)
MCV: 98.1 fL (ref 78.0–100.0)
Platelets: 241 10*3/uL (ref 150–400)
RBC: 4.38 MIL/uL (ref 4.22–5.81)
RDW: 12.7 % (ref 11.5–15.5)
WBC: 8 10*3/uL (ref 4.0–10.5)

## 2010-12-13 LAB — IRON AND TIBC
Iron: 72 ug/dL (ref 42–135)
Saturation Ratios: 19 % — ABNORMAL LOW (ref 20–55)
TIBC: 373 ug/dL (ref 215–435)
UIBC: 301 ug/dL

## 2010-12-13 LAB — FERRITIN: Ferritin: 26 ng/mL (ref 22–322)

## 2010-12-14 IMAGING — US US ABDOMEN COMPLETE
1 series · 14 of 25 positions shown · non-contrast
Comparison: CT abdomen of 10/22/2007

CLINICAL DATA: History of hemochromatosis, hepatoma surveillance

COMPLETE ABDOMINAL ULTRASOUND

[Series 1: us abdomen complete · 0.37mm/px · 14 of 57 slices shown]
[im 1/57]
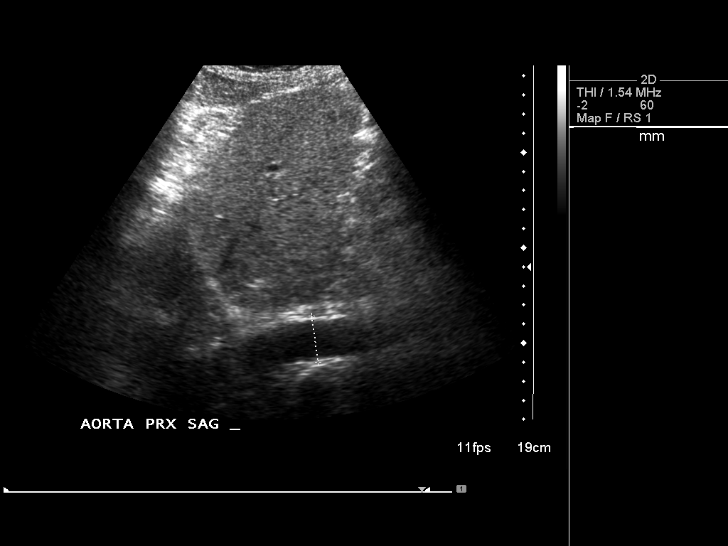
[im 5/57]
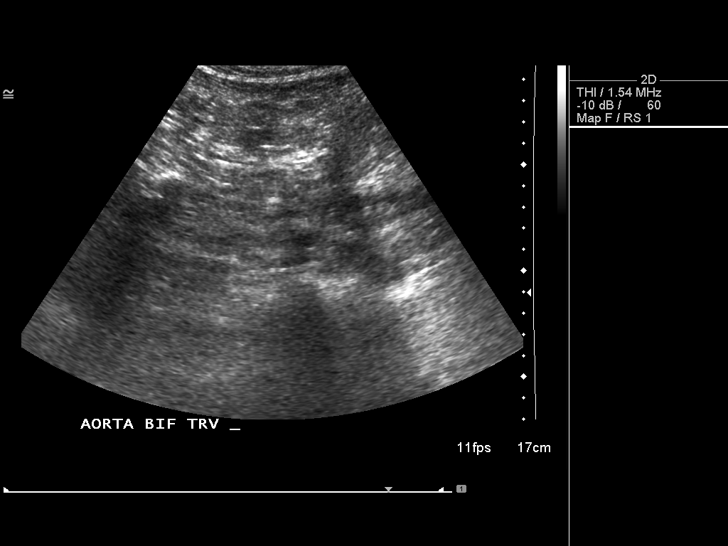
[im 10/57]
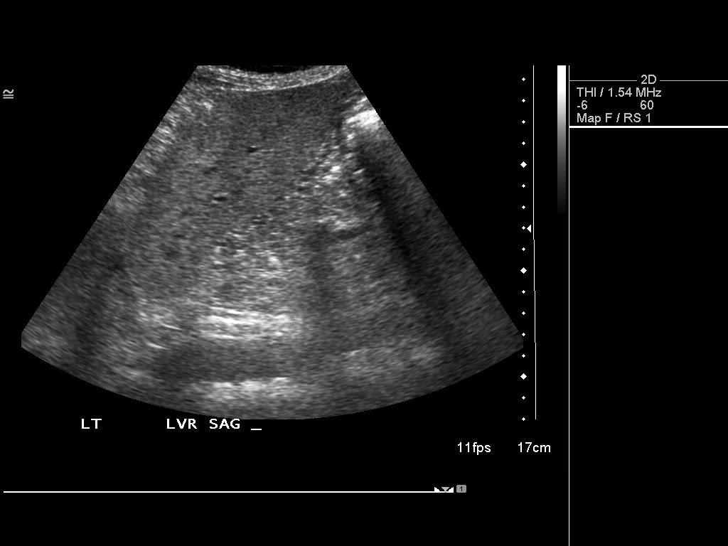
[im 15/57]
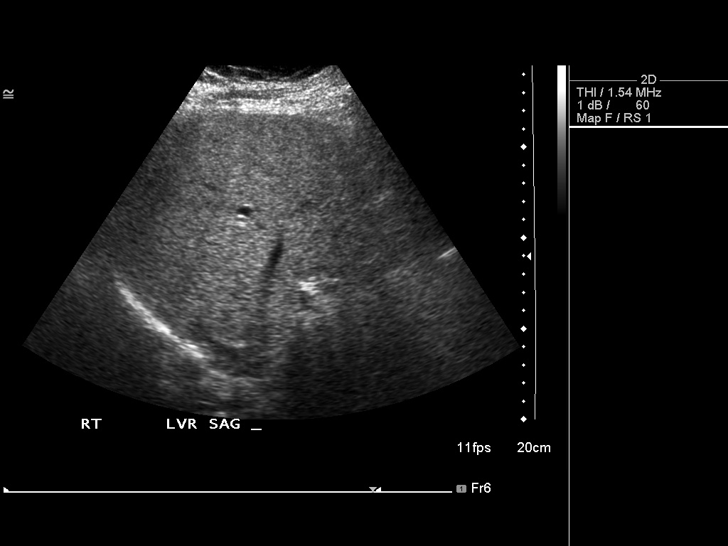
[im 19/57]
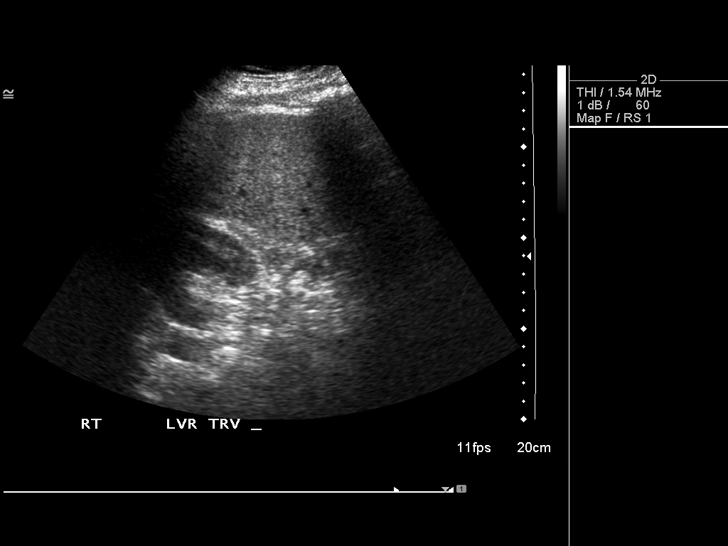
[im 22/57]
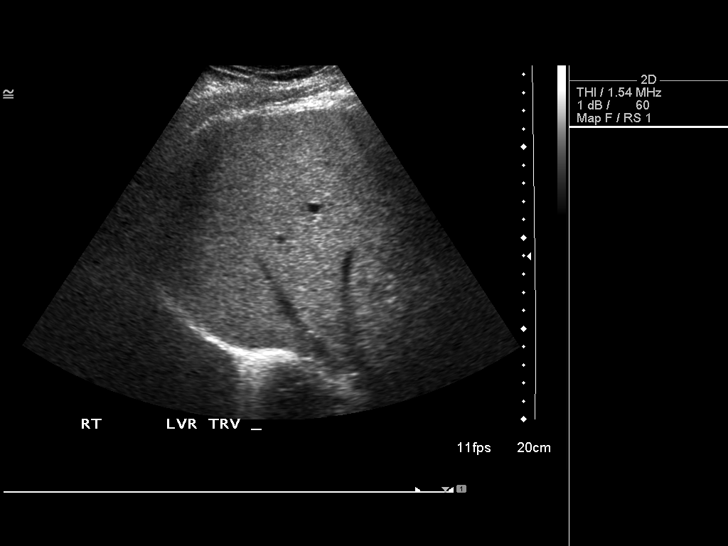
[im 26/57]
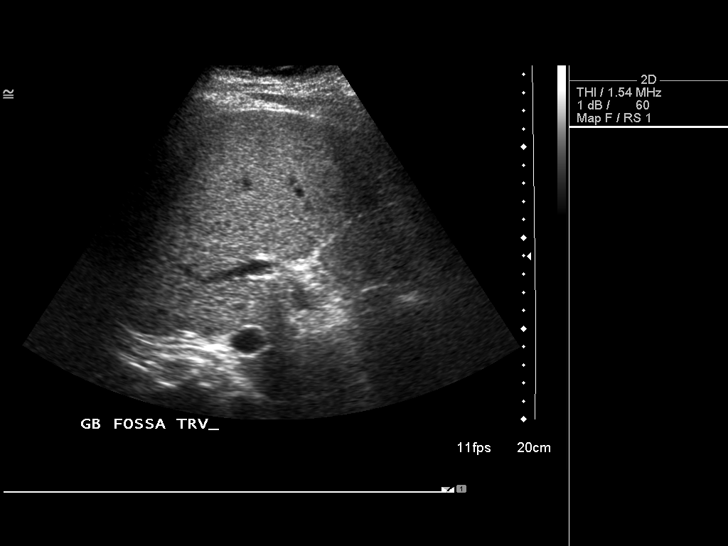
[im 31/57]
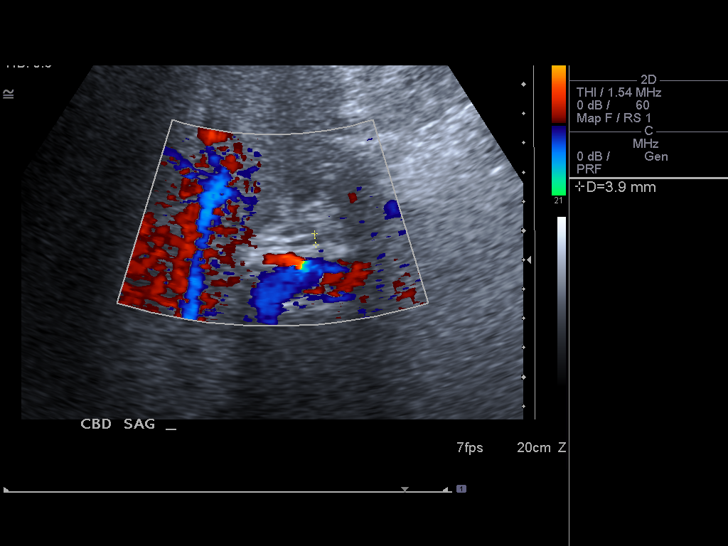
[im 36/57]
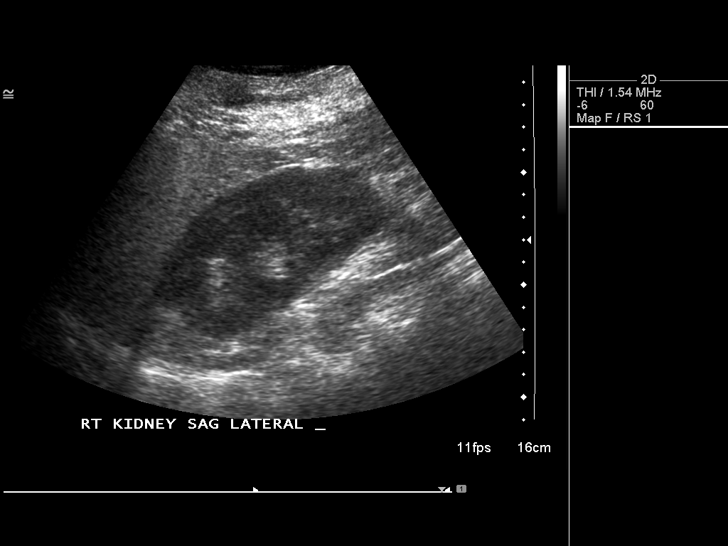
[im 38/57]
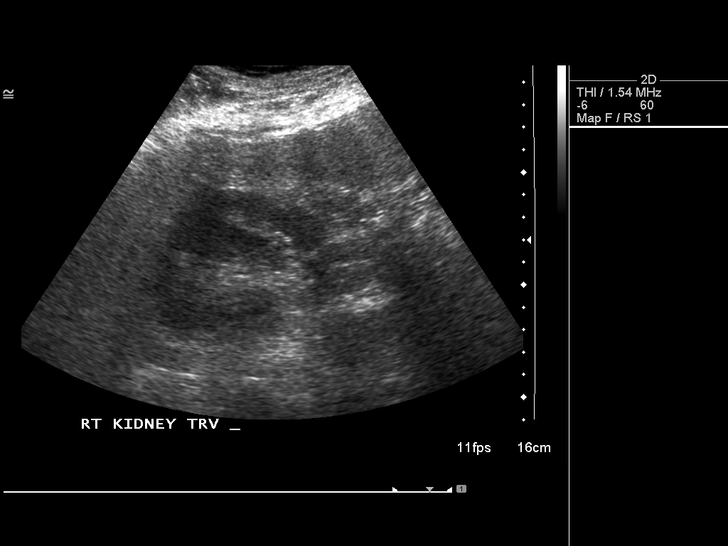
[im 43/57]
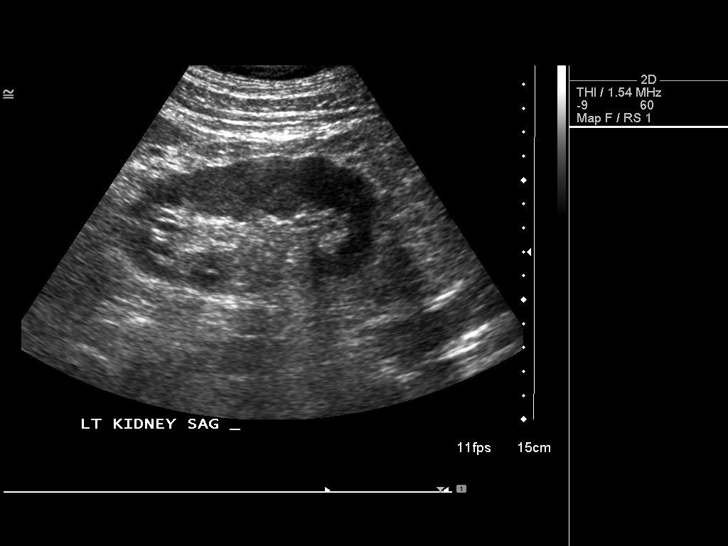
[im 47/57]
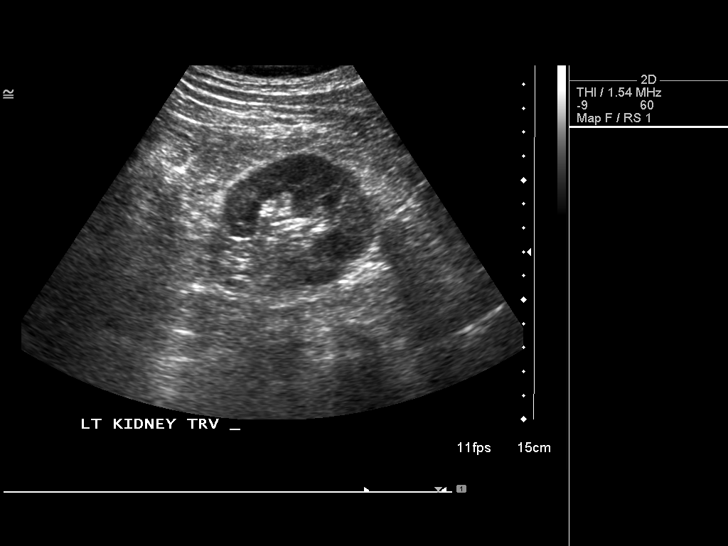
[im 52/57]
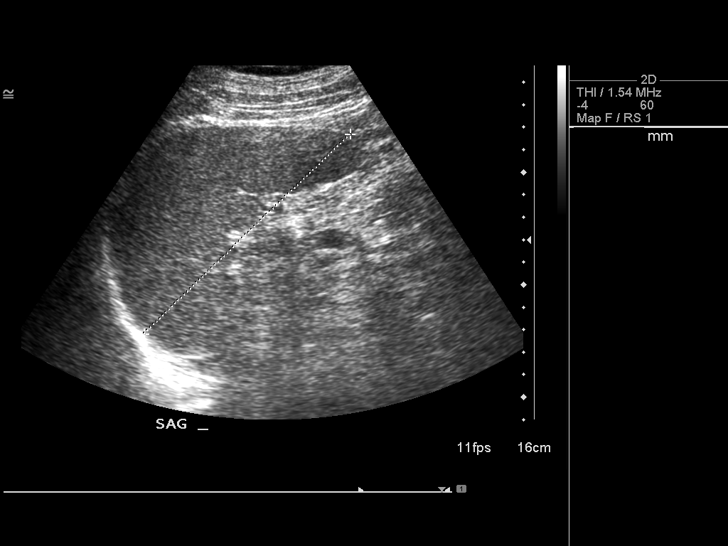
[im 57/57]
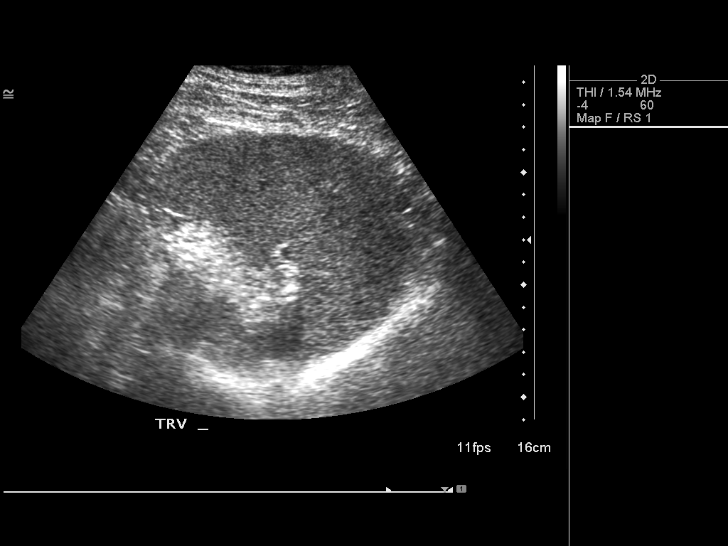

[14 of 25 positions shown; findings below may reference images not displayed]

FINDINGS: Gallbladder:  The gallbladder has previously been resected.

Common bile duct:  The common bile duct is normal measuring 4 mm in
diameter.

Liver:  The liver is echogenic in this patient with history of
hemochromatosis.  No focal lesion is seen.  No ductal dilatation is
noted.

IVC:  The IVC is obscured by bowel gas.

Pancreas:  The pancreas is largely obscured by bowel gas.

Spleen:  The spleen is within upper limits of normal measuring
cm sagittally.

Right Kidney:  No hydronephrosis is seen.  The right kidney
measures 12.0 cm sagittally.

Left Kidney:  No hydronephrosis and the left kidney measures
cm.

Abdominal aorta:  The abdominal aorta is partially obscured by
bowel gas.  No aneurysm is seen.
IMPRESSION: 1.  Echogenic liver in patient with history of hemochromatosis.  No
focal hepatic lesion.
2.  Prior cholecystectomy.
3.  The pancreas is obscured by bowel gas.

## 2010-12-16 LAB — T-HELPER CELL (CD4) - (RCID CLINIC ONLY)
CD4 % Helper T Cell: 24 — ABNORMAL LOW
CD4 T Cell Abs: 310 — ABNORMAL LOW

## 2010-12-18 ENCOUNTER — Other Ambulatory Visit: Payer: Self-pay | Admitting: *Deleted

## 2010-12-18 DIAGNOSIS — B20 Human immunodeficiency virus [HIV] disease: Secondary | ICD-10-CM

## 2010-12-18 MED ORDER — EFAVIRENZ-EMTRICITAB-TENOFOVIR 600-200-300 MG PO TABS: 1.0000 | ORAL_TABLET | Freq: Every day | ORAL | Status: DC

## 2010-12-23 ENCOUNTER — Other Ambulatory Visit: Payer: Self-pay | Admitting: *Deleted

## 2010-12-23 DIAGNOSIS — B20 Human immunodeficiency virus [HIV] disease: Secondary | ICD-10-CM

## 2010-12-23 MED ORDER — EFAVIRENZ-EMTRICITAB-TENOFOVIR 600-200-300 MG PO TABS: 1.0000 | ORAL_TABLET | Freq: Every day | ORAL | Status: DC

## 2010-12-23 NOTE — Telephone Encounter (Signed)
I ok'd another 30 day fill & asked them to tell pt he must call & make & keep appt

## 2010-12-25 LAB — POCT I-STAT 4, (NA,K, GLUC, HGB,HCT)
Glucose, Bld: 101 — ABNORMAL HIGH
HCT: 46
Hemoglobin: 15.6
Operator id: 154481
Potassium: 4.2
Sodium: 139

## 2010-12-25 LAB — COMPREHENSIVE METABOLIC PANEL
ALT: 30
AST: 31
Albumin: 4.5
Alkaline Phosphatase: 130 — ABNORMAL HIGH
BUN: 16
CO2: 27
Calcium: 9.7
Chloride: 106
Creatinine, Ser: 1.02
GFR calc Af Amer: >60
GFR calc non Af Amer: >60
Glucose, Bld: 105 — ABNORMAL HIGH
Potassium: 4.1
Sodium: 139
Total Bilirubin: 1
Total Protein: 7.9

## 2010-12-25 LAB — T-HELPER CELL (CD4) - (RCID CLINIC ONLY)
CD4 % Helper T Cell: 21 — ABNORMAL LOW
CD4 T Cell Abs: 380 — ABNORMAL LOW

## 2010-12-25 LAB — IRON AND TIBC
Iron: 114
Saturation Ratios: 37
TIBC: 305
UIBC: 191

## 2010-12-25 LAB — URINALYSIS, ROUTINE W REFLEX MICROSCOPIC
Bilirubin Urine: NEGATIVE
Glucose, UA: NEGATIVE
Hgb urine dipstick: NEGATIVE
Ketones, ur: NEGATIVE
Leukocytes, UA: NEGATIVE
Nitrite: NEGATIVE
Protein, ur: NEGATIVE
Specific Gravity, Urine: 1.015 (ref 1.005–1.035)
Urobilinogen, UA: 0.2
pH: 6

## 2010-12-25 LAB — CBC
HCT: 43.4
Hemoglobin: 15.2
MCHC: 35
MCV: 96.3
Platelets: 247
RBC: 4.51
RDW: 13.6
WBC: 5.6

## 2010-12-25 LAB — URIC ACID: Uric Acid, Serum: 4.6

## 2010-12-25 LAB — PHOSPHORUS: Phosphorus: 3.6

## 2010-12-25 LAB — HEMOGLOBIN A1C: Hgb A1c MFr Bld: 5.4

## 2010-12-25 LAB — MAGNESIUM: Magnesium: 2.1

## 2010-12-25 LAB — FERRITIN: Ferritin: 31 (ref 22–322)

## 2011-01-21 ENCOUNTER — Other Ambulatory Visit: Payer: Self-pay | Admitting: Licensed Clinical Social Worker

## 2011-01-21 DIAGNOSIS — B20 Human immunodeficiency virus [HIV] disease: Secondary | ICD-10-CM

## 2011-01-22 ENCOUNTER — Other Ambulatory Visit (INDEPENDENT_AMBULATORY_CARE_PROVIDER_SITE_OTHER): Payer: 59

## 2011-01-22 ENCOUNTER — Other Ambulatory Visit: Payer: Self-pay | Admitting: Internal Medicine

## 2011-01-22 ENCOUNTER — Other Ambulatory Visit: Payer: Self-pay | Admitting: Infectious Diseases

## 2011-01-22 DIAGNOSIS — E119 Type 2 diabetes mellitus without complications: Secondary | ICD-10-CM

## 2011-01-22 DIAGNOSIS — B20 Human immunodeficiency virus [HIV] disease: Secondary | ICD-10-CM

## 2011-01-22 LAB — CBC WITH DIFFERENTIAL/PLATELET
Basophils Absolute: 0 10*3/uL (ref 0.0–0.1)
Basophils Relative: 0 % (ref 0–1)
Eosinophils Absolute: 0.1 10*3/uL (ref 0.0–0.7)
Eosinophils Relative: 2 % (ref 0–5)
HCT: 41.8 % (ref 39.0–52.0)
Hemoglobin: 14.4 g/dL (ref 13.0–17.0)
Lymphocytes Relative: 43 % (ref 12–46)
Lymphs Abs: 2.2 10*3/uL (ref 0.7–4.0)
MCH: 34.4 pg — ABNORMAL HIGH (ref 26.0–34.0)
MCHC: 34.4 g/dL (ref 30.0–36.0)
MCV: 100 fL (ref 78.0–100.0)
Monocytes Absolute: 0.4 10*3/uL (ref 0.1–1.0)
Monocytes Relative: 8 % (ref 3–12)
Neutro Abs: 2.4 10*3/uL (ref 1.7–7.7)
Neutrophils Relative %: 46 % (ref 43–77)
Platelets: 183 10*3/uL (ref 150–400)
RBC: 4.18 MIL/uL — ABNORMAL LOW (ref 4.22–5.81)
RDW: 13.3 % (ref 11.5–15.5)
WBC: 5.1 10*3/uL (ref 4.0–10.5)

## 2011-01-22 LAB — HEMOGLOBIN A1C
Hgb A1c MFr Bld: 6.4 % — ABNORMAL HIGH (ref <5.7)
Mean Plasma Glucose: 137 mg/dL — ABNORMAL HIGH (ref <117)

## 2011-01-22 LAB — COMPLETE METABOLIC PANEL WITH GFR
ALT: 54 U/L — ABNORMAL HIGH (ref 0–53)
AST: 61 U/L — ABNORMAL HIGH (ref 0–37)
Albumin: 4.6 g/dL (ref 3.5–5.2)
Alkaline Phosphatase: 117 U/L (ref 39–117)
BUN: 16 mg/dL (ref 6–23)
CO2: 22 mEq/L (ref 19–32)
Calcium: 9.1 mg/dL (ref 8.4–10.5)
Chloride: 106 mEq/L (ref 96–112)
Creat: 1.09 mg/dL (ref 0.50–1.35)
GFR, Est African American: 86 mL/min/{1.73_m2}
GFR, Est Non African American: 74 mL/min/{1.73_m2}
Glucose, Bld: 247 mg/dL — ABNORMAL HIGH (ref 70–99)
Potassium: 4.8 mEq/L (ref 3.5–5.3)
Sodium: 140 mEq/L (ref 135–145)
Total Bilirubin: 0.6 mg/dL (ref 0.3–1.2)
Total Protein: 7.2 g/dL (ref 6.0–8.3)

## 2011-01-23 LAB — T-HELPER CELL (CD4) - (RCID CLINIC ONLY)
CD4 % Helper T Cell: 21 % — ABNORMAL LOW (ref 33–55)
CD4 T Cell Abs: 510 uL (ref 400–2700)

## 2011-01-24 LAB — HIV-1 RNA QUANT-NO REFLEX-BLD: HIV 1 RNA Quant: NOT DETECTED copies/mL (ref <20)

## 2011-02-05 ENCOUNTER — Encounter: Payer: Self-pay | Admitting: Infectious Diseases

## 2011-02-05 ENCOUNTER — Ambulatory Visit (INDEPENDENT_AMBULATORY_CARE_PROVIDER_SITE_OTHER): Payer: 59 | Admitting: Infectious Diseases

## 2011-02-05 DIAGNOSIS — Z862 Personal history of diseases of the blood and blood-forming organs and certain disorders involving the immune mechanism: Secondary | ICD-10-CM

## 2011-02-05 DIAGNOSIS — I10 Essential (primary) hypertension: Secondary | ICD-10-CM

## 2011-02-05 DIAGNOSIS — Z113 Encounter for screening for infections with a predominantly sexual mode of transmission: Secondary | ICD-10-CM

## 2011-02-05 DIAGNOSIS — B20 Human immunodeficiency virus [HIV] disease: Secondary | ICD-10-CM

## 2011-02-05 DIAGNOSIS — IMO0001 Reserved for inherently not codable concepts without codable children: Secondary | ICD-10-CM

## 2011-02-05 DIAGNOSIS — Z79899 Other long term (current) drug therapy: Secondary | ICD-10-CM

## 2011-02-05 DIAGNOSIS — E785 Hyperlipidemia, unspecified: Secondary | ICD-10-CM | POA: Insufficient documentation

## 2011-02-05 MED ORDER — EFAVIRENZ-EMTRICITAB-TENOFOVIR 600-200-300 MG PO TABS: 1.0000 | ORAL_TABLET | Freq: Every day | ORAL | Status: DC

## 2011-02-05 NOTE — Assessment & Plan Note (Signed)
Still mildly elevated. Greatly appreciate opportunity to partner with Dr Reynolds Bowl.

## 2011-02-05 NOTE — Assessment & Plan Note (Signed)
His triglycerides are markedly elevated. Will see if Dr Cato Mulligan wishes to start him on gemfibrozil. Otherwise, will start at his f/u visit.

## 2011-02-05 NOTE — Assessment & Plan Note (Signed)
His A1C suggests he is doing fairly well. Greatly appreciate partnering with Dr Cato Mulligan.

## 2011-02-05 NOTE — Progress Notes (Signed)
  Subjective:    Patient ID: [name], male    DOB: 09-17-1950, 60 y.o.   MRN: [id number]  HPI 60 yo M with hx of hemochromatosis, DM2 (on border 10-15 yrs, on metformin 73yr), hyperlipidemia, and HIV+ since 80 when he was dx on insurance physical. He has been maintained on atripla (his only rx).  Last CD4 510 and VL <20 (01-22-11). AST 61, Alt 54, HgBA1C 6.4%. Doesn't check home Glc. Has had some problems with back pain- bulging disc. Pain has been radiating into his L great toe. Had lumbar epidural recently. Continues to have blood drawn every 6-8 weeks (last 3 months ago).    Review of Systems  Constitutional: Positive for unexpected weight change (lost 35# several months ago, then gained back over 3-4  months). Negative for fever and chills.  Respiratory: Negative for shortness of breath.   Gastrointestinal: Negative for diarrhea and constipation.  Genitourinary: Negative for dysuria.  Neurological: Negative for headaches.  Ophtho due first of 2013, will have podiatry done then as well.      Objective:   Physical Exam  Constitutional: He appears well-developed and well-nourished.  Eyes: EOM are normal. Pupils are equal, round, and reactive to light.  Neck: Neck supple.  Cardiovascular: Normal rate, regular rhythm and normal heart sounds.   Pulmonary/Chest: Effort normal and breath sounds normal.  Abdominal: Soft. Bowel sounds are normal. There is no tenderness.  Musculoskeletal: He exhibits no edema.  Lymphadenopathy:    He has no cervical adenopathy.  Neurological: A sensory deficit is present.       Decreased strength, light touch L great toe.   Skin: No rash noted. No erythema. No pallor.       Small wound on R great toe.           Assessment & Plan:

## 2011-02-05 NOTE — Assessment & Plan Note (Signed)
He continues to follow up with Dr Ewing Schlein. Suspect this is cause of his increased LFTs (?) .

## 2011-02-05 NOTE — Assessment & Plan Note (Signed)
He is doing very well. Will continue his atripla, refilled. He will need PNVx at visit in 2013. Will ask him to return in 6 months. Offered condoms, refused.

## 2011-04-08 ENCOUNTER — Other Ambulatory Visit: Payer: Self-pay | Admitting: Otolaryngology

## 2011-04-16 ENCOUNTER — Encounter (HOSPITAL_COMMUNITY): Payer: Self-pay | Admitting: Pharmacy Technician

## 2011-04-17 ENCOUNTER — Other Ambulatory Visit (HOSPITAL_COMMUNITY): Payer: 59

## 2011-04-18 ENCOUNTER — Ambulatory Visit (HOSPITAL_COMMUNITY)
Admission: RE | Admit: 2011-04-18 | Discharge: 2011-04-18 | Disposition: A | Discharge status: Home / Self Care | Payer: 59 | Source: Ambulatory Visit | Attending: Specialist | Admitting: Specialist

## 2011-04-18 ENCOUNTER — Other Ambulatory Visit: Payer: Self-pay

## 2011-04-18 ENCOUNTER — Encounter (HOSPITAL_COMMUNITY): Payer: Self-pay

## 2011-04-18 ENCOUNTER — Ambulatory Visit (HOSPITAL_COMMUNITY)
Admission: RE | Admit: 2011-04-18 | Discharge: 2011-04-18 | Disposition: A | Discharge status: Home / Self Care | Payer: 59 | Source: Ambulatory Visit | Attending: Otolaryngology | Admitting: Otolaryngology

## 2011-04-18 ENCOUNTER — Encounter (HOSPITAL_COMMUNITY)
Admission: RE | Admit: 2011-04-18 | Discharge: 2011-04-18 | Disposition: A | Discharge status: Home / Self Care | Payer: 59 | Source: Ambulatory Visit | Attending: Specialist | Admitting: Specialist

## 2011-04-18 DIAGNOSIS — R9431 Abnormal electrocardiogram [ECG] [EKG]: Secondary | ICD-10-CM | POA: Insufficient documentation

## 2011-04-18 DIAGNOSIS — Z01818 Encounter for other preprocedural examination: Secondary | ICD-10-CM | POA: Insufficient documentation

## 2011-04-18 DIAGNOSIS — M5137 Other intervertebral disc degeneration, lumbosacral region: Secondary | ICD-10-CM | POA: Insufficient documentation

## 2011-04-18 DIAGNOSIS — Z01812 Encounter for preprocedural laboratory examination: Secondary | ICD-10-CM | POA: Insufficient documentation

## 2011-04-18 DIAGNOSIS — Z01810 Encounter for preprocedural cardiovascular examination: Secondary | ICD-10-CM | POA: Insufficient documentation

## 2011-04-18 DIAGNOSIS — M51379 Other intervertebral disc degeneration, lumbosacral region without mention of lumbar back pain or lower extremity pain: Secondary | ICD-10-CM | POA: Insufficient documentation

## 2011-04-18 HISTORY — DX: Reserved for concepts with insufficient information to code with codable children: IMO0002

## 2011-04-18 HISTORY — DX: Other complications of anesthesia, initial encounter: T88.59XA

## 2011-04-18 HISTORY — DX: Adverse effect of unspecified anesthetic, initial encounter: T41.45XA

## 2011-04-18 LAB — CBC
HCT: 42.2 % (ref 39.0–52.0)
Hemoglobin: 15.4 g/dL (ref 13.0–17.0)
MCH: 35.3 pg — ABNORMAL HIGH (ref 26.0–34.0)
MCHC: 36.5 g/dL — ABNORMAL HIGH (ref 30.0–36.0)
MCV: 96.8 fL (ref 78.0–100.0)
Platelets: 189 10*3/uL (ref 150–400)
RBC: 4.36 MIL/uL (ref 4.22–5.81)
RDW: 13.2 % (ref 11.5–15.5)
WBC: 6.1 10*3/uL (ref 4.0–10.5)

## 2011-04-18 LAB — URINALYSIS, ROUTINE W REFLEX MICROSCOPIC
Bilirubin Urine: NEGATIVE
Glucose, UA: 100 mg/dL — AB
Hgb urine dipstick: NEGATIVE
Ketones, ur: NEGATIVE mg/dL
Leukocytes, UA: NEGATIVE
Nitrite: NEGATIVE
Protein, ur: NEGATIVE mg/dL
Specific Gravity, Urine: 1.018 (ref 1.005–1.030)
Urobilinogen, UA: 0.2 mg/dL (ref 0.0–1.0)
pH: 5.5 (ref 5.0–8.0)

## 2011-04-18 LAB — PROTIME-INR
INR: 1.02 (ref 0.00–1.49)
Prothrombin Time: 13.6 seconds (ref 11.6–15.2)

## 2011-04-18 LAB — DIFFERENTIAL
Basophils Absolute: 0 10*3/uL (ref 0.0–0.1)
Basophils Relative: 1 % (ref 0–1)
Eosinophils Absolute: 0.2 10*3/uL (ref 0.0–0.7)
Eosinophils Relative: 3 % (ref 0–5)
Lymphocytes Relative: 44 % (ref 12–46)
Lymphs Abs: 2.7 10*3/uL (ref 0.7–4.0)
Monocytes Absolute: 0.4 10*3/uL (ref 0.1–1.0)
Monocytes Relative: 7 % (ref 3–12)
Neutro Abs: 2.8 10*3/uL (ref 1.7–7.7)
Neutrophils Relative %: 46 % (ref 43–77)

## 2011-04-18 LAB — COMPREHENSIVE METABOLIC PANEL
ALT: 68 U/L — ABNORMAL HIGH (ref 0–53)
AST: 69 U/L — ABNORMAL HIGH (ref 0–37)
Albumin: 4.3 g/dL (ref 3.5–5.2)
Alkaline Phosphatase: 137 U/L — ABNORMAL HIGH (ref 39–117)
BUN: 9 mg/dL (ref 6–23)
CO2: 25 mEq/L (ref 19–32)
Calcium: 9.7 mg/dL (ref 8.4–10.5)
Chloride: 103 mEq/L (ref 96–112)
Creatinine, Ser: 0.75 mg/dL (ref 0.50–1.35)
GFR calc Af Amer: >90 mL/min (ref >90)
GFR calc non Af Amer: >90 mL/min (ref >90)
Glucose, Bld: 173 mg/dL — ABNORMAL HIGH (ref 70–99)
Potassium: 3.7 mEq/L (ref 3.5–5.1)
Sodium: 141 mEq/L (ref 135–145)
Total Bilirubin: 0.6 mg/dL (ref 0.3–1.2)
Total Protein: 8.1 g/dL (ref 6.0–8.3)

## 2011-04-18 LAB — SURGICAL PCR SCREEN
MRSA, PCR: NEGATIVE
Staphylococcus aureus: POSITIVE — AB

## 2011-04-18 NOTE — Patient Instructions (Signed)
20 [name]  04/18/2011   Your procedure is scheduled on: WED 2/13 AT 10:30 AM  Report to [facility] at 8:30 AM.  Call this number if you have problems the morning of surgery: [phone]   Remember:DO NOT TAKE ANY DIABETIC MEDICATIONS THE AM OF YOUR SURGERY.   Do not eat food OR DRINK ANYTHING AFTER MIDNIGHT THE NIGHT BEFORE YOUR SURGERY.    Take these medicines the morning of surgery with A SIP OF WATER: ALLOPURINOL, AMLODIPINE, CARVEDILOL, COLCHICINE, MICARDIS   Do not wear jewelry.  Do not wear lotions.    Do not bring valuables to the hospital.  Contacts, dentures or bridgework may not be worn into surgery.  Leave suitcase in the car. After surgery it may be brought to your room.  For patients admitted to the hospital, checkout time is 11:00 AM the day of discharge.   Patients discharged the day of surgery will not be allowed to drive home.    Special Instructions: CHG Shower Use Special Wash: 1/2 bottle night before surgery and 1/2 bottle morning of surgery.   Please read over the following fact sheets that you were given: MRSA Information AND INCENTIVE SPIROMETER INFORMATION

## 2011-04-23 ENCOUNTER — Ambulatory Visit (HOSPITAL_COMMUNITY): Payer: 59 | Admitting: Anesthesiology

## 2011-04-23 ENCOUNTER — Ambulatory Visit (HOSPITAL_COMMUNITY): Payer: 59

## 2011-04-23 ENCOUNTER — Encounter (HOSPITAL_COMMUNITY)
Admission: RE | Disposition: A | Discharge status: Home / Self Care | Payer: Self-pay | Source: Ambulatory Visit | Attending: Specialist

## 2011-04-23 ENCOUNTER — Encounter (HOSPITAL_COMMUNITY): Payer: Self-pay | Admitting: Anesthesiology

## 2011-04-23 ENCOUNTER — Encounter (HOSPITAL_COMMUNITY): Payer: Self-pay | Admitting: *Deleted

## 2011-04-23 ENCOUNTER — Observation Stay (HOSPITAL_COMMUNITY)
Admission: RE | Admit: 2011-04-23 | Discharge: 2011-04-25 | Disposition: A | Discharge status: Home / Self Care | Payer: 59 | Source: Ambulatory Visit | Attending: Specialist | Admitting: Specialist

## 2011-04-23 DIAGNOSIS — Z79899 Other long term (current) drug therapy: Secondary | ICD-10-CM | POA: Insufficient documentation

## 2011-04-23 DIAGNOSIS — E119 Type 2 diabetes mellitus without complications: Secondary | ICD-10-CM | POA: Insufficient documentation

## 2011-04-23 DIAGNOSIS — M5126 Other intervertebral disc displacement, lumbar region: Principal | ICD-10-CM | POA: Insufficient documentation

## 2011-04-23 DIAGNOSIS — I10 Essential (primary) hypertension: Secondary | ICD-10-CM | POA: Insufficient documentation

## 2011-04-23 DIAGNOSIS — M519 Unspecified thoracic, thoracolumbar and lumbosacral intervertebral disc disorder: Secondary | ICD-10-CM | POA: Insufficient documentation

## 2011-04-23 DIAGNOSIS — M4800 Spinal stenosis, site unspecified: Secondary | ICD-10-CM

## 2011-04-23 DIAGNOSIS — B20 Human immunodeficiency virus [HIV] disease: Secondary | ICD-10-CM

## 2011-04-23 DIAGNOSIS — M109 Gout, unspecified: Secondary | ICD-10-CM | POA: Insufficient documentation

## 2011-04-23 HISTORY — PX: LUMBAR LAMINECTOMY/DECOMPRESSION MICRODISCECTOMY: SHX5026

## 2011-04-23 LAB — GLUCOSE, CAPILLARY
Glucose-Capillary: 154 mg/dL — ABNORMAL HIGH (ref 70–99)
Glucose-Capillary: 179 mg/dL — ABNORMAL HIGH (ref 70–99)
Glucose-Capillary: 150 mg/dL — ABNORMAL HIGH (ref 70–99)
Glucose-Capillary: 194 mg/dL — ABNORMAL HIGH (ref 70–99)

## 2011-04-23 SURGERY — LUMBAR LAMINECTOMY/DECOMPRESSION MICRODISCECTOMY
Anesthesia: General | Site: Back | Wound class: Clean

## 2011-04-23 MED ORDER — ACETAMINOPHEN 10 MG/ML IV SOLN
INTRAVENOUS | Status: AC
  Filled 2011-04-23: qty 100

## 2011-04-23 MED ORDER — PROMETHAZINE HCL 25 MG/ML IJ SOLN: 6.2500 mg | INTRAMUSCULAR | Status: DC | PRN

## 2011-04-23 MED ORDER — HYDROMORPHONE HCL PF 1 MG/ML IJ SOLN
INTRAMUSCULAR | Status: DC | PRN
  Administered 2011-04-23 (×2): 1 mg via INTRAVENOUS

## 2011-04-23 MED ORDER — INSULIN ASPART 100 UNIT/ML ~~LOC~~ SOLN
0.0000 [IU] | SUBCUTANEOUS | Status: DC
  Administered 2011-04-23: 5 [IU] via SUBCUTANEOUS

## 2011-04-23 MED ORDER — ONDANSETRON HCL 4 MG/2ML IJ SOLN: 4.0000 mg | INTRAMUSCULAR | Status: DC | PRN

## 2011-04-23 MED ORDER — CLINDAMYCIN PHOSPHATE 600 MG/50ML IV SOLN
INTRAVENOUS | Status: AC
  Filled 2011-04-23: qty 50

## 2011-04-23 MED ORDER — METHOCARBAMOL 100 MG/ML IJ SOLN
500.0000 mg | Freq: Four times a day (QID) | INTRAVENOUS | Status: DC | PRN
  Administered 2011-04-23: 500 mg via INTRAVENOUS
  Filled 2011-04-23: qty 5

## 2011-04-23 MED ORDER — COLCHICINE 0.6 MG PO TABS
1.2000 mg | ORAL_TABLET | Freq: Every day | ORAL | Status: DC
  Administered 2011-04-24 – 2011-04-25 (×2): 1.2 mg via ORAL
  Filled 2011-04-23 (×3): qty 2

## 2011-04-23 MED ORDER — FENTANYL CITRATE 0.05 MG/ML IJ SOLN
INTRAMUSCULAR | Status: DC | PRN
  Administered 2011-04-23: 100 ug via INTRAVENOUS
  Administered 2011-04-23: 50 ug via INTRAVENOUS
  Administered 2011-04-23: 100 ug via INTRAVENOUS

## 2011-04-23 MED ORDER — METHOCARBAMOL 500 MG PO TABS
500.0000 mg | ORAL_TABLET | Freq: Four times a day (QID) | ORAL | Status: DC | PRN
  Administered 2011-04-24 – 2011-04-25 (×2): 500 mg via ORAL
  Filled 2011-04-23 (×3): qty 1

## 2011-04-23 MED ORDER — CLINDAMYCIN PHOSPHATE 600 MG/50ML IV SOLN: 600.0000 mg | Freq: Four times a day (QID) | INTRAVENOUS | Status: DC

## 2011-04-23 MED ORDER — CEFAZOLIN SODIUM 1-5 GM-% IV SOLN
1.0000 g | Freq: Three times a day (TID) | INTRAVENOUS | Status: AC
  Administered 2011-04-23 (×2): 1 g via INTRAVENOUS
  Filled 2011-04-23 (×2): qty 50

## 2011-04-23 MED ORDER — ROCURONIUM BROMIDE 100 MG/10ML IV SOLN
INTRAVENOUS | Status: DC | PRN
  Administered 2011-04-23 (×2): 10 mg via INTRAVENOUS
  Administered 2011-04-23: 50 mg via INTRAVENOUS

## 2011-04-23 MED ORDER — MIDAZOLAM HCL 5 MG/5ML IJ SOLN
INTRAMUSCULAR | Status: DC | PRN
  Administered 2011-04-23: 2 mg via INTRAVENOUS

## 2011-04-23 MED ORDER — HYDROMORPHONE HCL PF 1 MG/ML IJ SOLN: 0.5000 mg | INTRAMUSCULAR | Status: DC | PRN

## 2011-04-23 MED ORDER — BUPIVACAINE-EPINEPHRINE 0.5% -1:200000 IJ SOLN
INTRAMUSCULAR | Status: DC | PRN
  Administered 2011-04-23: 50 mL

## 2011-04-23 MED ORDER — SODIUM CHLORIDE 0.9 % IJ SOLN: 3.0000 mL | Freq: Two times a day (BID) | INTRAMUSCULAR | Status: DC

## 2011-04-23 MED ORDER — PROPOFOL 10 MG/ML IV EMUL
INTRAVENOUS | Status: DC | PRN
  Administered 2011-04-23: 200 mg via INTRAVENOUS

## 2011-04-23 MED ORDER — ACETAMINOPHEN 650 MG RE SUPP: 650.0000 mg | RECTAL | Status: DC | PRN

## 2011-04-23 MED ORDER — ALLOPURINOL 300 MG PO TABS
300.0000 mg | ORAL_TABLET | Freq: Every day | ORAL | Status: DC
  Administered 2011-04-24 – 2011-04-25 (×2): 300 mg via ORAL
  Filled 2011-04-23 (×3): qty 1

## 2011-04-23 MED ORDER — BUPIVACAINE-EPINEPHRINE 0.5% -1:200000 IJ SOLN
INTRAMUSCULAR | Status: AC
  Filled 2011-04-23: qty 1

## 2011-04-23 MED ORDER — NEOSTIGMINE METHYLSULFATE 1 MG/ML IJ SOLN
INTRAMUSCULAR | Status: DC | PRN
  Administered 2011-04-23: 5 mg via INTRAVENOUS

## 2011-04-23 MED ORDER — SODIUM CHLORIDE 0.9 % IV SOLN: 250.0000 mL | INTRAVENOUS | Status: DC

## 2011-04-23 MED ORDER — HYDROCODONE-ACETAMINOPHEN 5-325 MG PO TABS
1.0000 | ORAL_TABLET | ORAL | Status: DC | PRN
  Administered 2011-04-23 – 2011-04-24 (×3): 2 via ORAL
  Filled 2011-04-23: qty 1
  Filled 2011-04-23: qty 2
  Filled 2011-04-23: qty 1
  Filled 2011-04-23 (×2): qty 2

## 2011-04-23 MED ORDER — CEFAZOLIN SODIUM-DEXTROSE 2-3 GM-% IV SOLR
INTRAVENOUS | Status: AC
  Filled 2011-04-23: qty 50

## 2011-04-23 MED ORDER — GLYCOPYRROLATE 0.2 MG/ML IJ SOLN
INTRAMUSCULAR | Status: DC | PRN
  Administered 2011-04-23: .8 mg via INTRAVENOUS

## 2011-04-23 MED ORDER — THROMBIN 5000 UNITS EX SOLR
CUTANEOUS | Status: AC
  Filled 2011-04-23: qty 5000

## 2011-04-23 MED ORDER — CHLORHEXIDINE GLUCONATE 4 % EX LIQD: 60.0000 mL | Freq: Once | CUTANEOUS | Status: DC

## 2011-04-23 MED ORDER — SODIUM CHLORIDE 0.9 % IR SOLN
Status: DC | PRN
  Administered 2011-04-23: 12:00:00

## 2011-04-23 MED ORDER — PHENYLEPHRINE HCL 10 MG/ML IJ SOLN
INTRAMUSCULAR | Status: DC | PRN
  Administered 2011-04-23 (×2): 80 ug via INTRAVENOUS
  Administered 2011-04-23: 120 ug via INTRAVENOUS
  Administered 2011-04-23 (×2): 80 ug via INTRAVENOUS

## 2011-04-23 MED ORDER — SODIUM CHLORIDE 0.9 % IJ SOLN: 3.0000 mL | INTRAMUSCULAR | Status: DC | PRN

## 2011-04-23 MED ORDER — FENTANYL CITRATE 0.05 MG/ML IJ SOLN: 25.0000 ug | INTRAMUSCULAR | Status: DC | PRN

## 2011-04-23 MED ORDER — LIDOCAINE HCL (CARDIAC) 20 MG/ML IV SOLN
INTRAVENOUS | Status: DC | PRN
  Administered 2011-04-23: 100 mg via INTRAVENOUS

## 2011-04-23 MED ORDER — EFAVIRENZ-EMTRICITAB-TENOFOVIR 600-200-300 MG PO TABS
1.0000 | ORAL_TABLET | Freq: Every day | ORAL | Status: DC
  Administered 2011-04-23 – 2011-04-24 (×2): 1 via ORAL
  Filled 2011-04-23 (×3): qty 1

## 2011-04-23 MED ORDER — THROMBIN 5000 UNITS EX SOLR
CUTANEOUS | Status: DC | PRN
  Administered 2011-04-23: 5000 [IU] via TOPICAL

## 2011-04-23 MED ORDER — AMLODIPINE BESYLATE 10 MG PO TABS
10.0000 mg | ORAL_TABLET | Freq: Every day | ORAL | Status: DC
  Administered 2011-04-24 – 2011-04-25 (×2): 10 mg via ORAL
  Filled 2011-04-23 (×3): qty 1

## 2011-04-23 MED ORDER — MENTHOL 3 MG MT LOZG
1.0000 | LOZENGE | OROMUCOSAL | Status: DC | PRN
  Filled 2011-04-23: qty 9

## 2011-04-23 MED ORDER — CLINDAMYCIN PHOSPHATE 600 MG/50ML IV SOLN
600.0000 mg | Freq: Four times a day (QID) | INTRAVENOUS | Status: AC
  Administered 2011-04-23 – 2011-04-24 (×3): 600 mg via INTRAVENOUS
  Filled 2011-04-23 (×3): qty 50

## 2011-04-23 MED ORDER — CLINDAMYCIN PHOSPHATE 600 MG/50ML IV SOLN
INTRAVENOUS | Status: DC | PRN
  Administered 2011-04-23: 600 mg via INTRAVENOUS

## 2011-04-23 MED ORDER — TELMISARTAN 80 MG PO TABS
80.0000 mg | ORAL_TABLET | Freq: Every day | ORAL | Status: DC
  Administered 2011-04-23 – 2011-04-25 (×3): 80 mg via ORAL
  Filled 2011-04-23 (×3): qty 1

## 2011-04-23 MED ORDER — SODIUM CHLORIDE 0.9 % IV SOLN
INTRAVENOUS | Status: DC
  Administered 2011-04-23 – 2011-04-24 (×2): via INTRAVENOUS

## 2011-04-23 MED ORDER — LACTATED RINGERS IV SOLN
INTRAVENOUS | Status: DC
  Administered 2011-04-23 (×2): via INTRAVENOUS

## 2011-04-23 MED ORDER — INSULIN ASPART 100 UNIT/ML ~~LOC~~ SOLN
0.0000 [IU] | Freq: Three times a day (TID) | SUBCUTANEOUS | Status: DC
  Administered 2011-04-23: 3 [IU] via SUBCUTANEOUS
  Administered 2011-04-24: 5 [IU] via SUBCUTANEOUS
  Administered 2011-04-24: 3 [IU] via SUBCUTANEOUS
  Administered 2011-04-24: 2 [IU] via SUBCUTANEOUS
  Administered 2011-04-25: 3 [IU] via SUBCUTANEOUS
  Filled 2011-04-23: qty 3

## 2011-04-23 MED ORDER — PHENOL 1.4 % MT LIQD
1.0000 | OROMUCOSAL | Status: DC | PRN
  Filled 2011-04-23: qty 177

## 2011-04-23 MED ORDER — CARVEDILOL 25 MG PO TABS
25.0000 mg | ORAL_TABLET | Freq: Two times a day (BID) | ORAL | Status: DC
  Administered 2011-04-23 – 2011-04-25 (×4): 25 mg via ORAL
  Filled 2011-04-23 (×6): qty 1

## 2011-04-23 MED ORDER — LACTATED RINGERS IV SOLN: INTRAVENOUS | Status: DC

## 2011-04-23 MED ORDER — DOCUSATE SODIUM 100 MG PO CAPS
100.0000 mg | ORAL_CAPSULE | Freq: Two times a day (BID) | ORAL | Status: DC
  Administered 2011-04-23 – 2011-04-25 (×5): 100 mg via ORAL
  Filled 2011-04-23 (×6): qty 1

## 2011-04-23 MED ORDER — METFORMIN HCL 500 MG PO TABS
500.0000 mg | ORAL_TABLET | Freq: Two times a day (BID) | ORAL | Status: DC
  Administered 2011-04-23 – 2011-04-25 (×4): 500 mg via ORAL
  Filled 2011-04-23 (×6): qty 1

## 2011-04-23 MED ORDER — ACETAMINOPHEN 325 MG PO TABS: 650.0000 mg | ORAL_TABLET | ORAL | Status: DC | PRN

## 2011-04-23 MED ORDER — NON FORMULARY
80.0000 mg | Freq: Every morning | Status: DC
Start: 2011-04-23 — End: 2011-04-23

## 2011-04-23 MED ORDER — ACETAMINOPHEN 10 MG/ML IV SOLN
INTRAVENOUS | Status: DC | PRN
  Administered 2011-04-23: 1000 mg via INTRAVENOUS

## 2011-04-23 MED ORDER — ONDANSETRON HCL 4 MG/2ML IJ SOLN
INTRAMUSCULAR | Status: DC | PRN
  Administered 2011-04-23: 4 mg via INTRAVENOUS

## 2011-04-23 MED ORDER — CEFAZOLIN SODIUM-DEXTROSE 2-3 GM-% IV SOLR
2.0000 g | INTRAVENOUS | Status: AC
  Administered 2011-04-23: 2 g via INTRAVENOUS

## 2011-04-23 SURGICAL SUPPLY — 54 items
APL SKNCLS STERI-STRIP NONHPOA (GAUZE/BANDAGES/DRESSINGS) ×1
BAG SPEC THK2 15X12 ZIP CLS (MISCELLANEOUS) ×1
BAG ZIPLOCK 12X15 (MISCELLANEOUS) ×2 IMPLANT
BENZOIN TINCTURE PRP APPL 2/3 (GAUZE/BANDAGES/DRESSINGS) ×2 IMPLANT
CHLORAPREP W/TINT 26ML (MISCELLANEOUS) IMPLANT
CLEANER TIP ELECTROSURG 2X2 (MISCELLANEOUS) ×2 IMPLANT
CLOSURE STERI STRIP 1/2 X4 (GAUZE/BANDAGES/DRESSINGS) ×1 IMPLANT
CLOTH BEACON ORANGE TIMEOUT ST (SAFETY) ×2 IMPLANT
DECANTER SPIKE VIAL GLASS SM (MISCELLANEOUS) ×2 IMPLANT
DRAPE MICROSCOPE LEICA (MISCELLANEOUS) ×2 IMPLANT
DRAPE POUCH INSTRU U-SHP 10X18 (DRAPES) ×2 IMPLANT
DRAPE SURG 17X11 SM STRL (DRAPES) ×2 IMPLANT
DRSG EMULSION OIL 3X3 NADH (GAUZE/BANDAGES/DRESSINGS) IMPLANT
DRSG PAD ABDOMINAL 8X10 ST (GAUZE/BANDAGES/DRESSINGS) IMPLANT
DRSG TELFA 4X5 ISLAND ADH (GAUZE/BANDAGES/DRESSINGS) ×1 IMPLANT
DRSG TELFA PLUS 4X6 ADH ISLAND (GAUZE/BANDAGES/DRESSINGS) ×1 IMPLANT
DURAPREP 26ML APPLICATOR (WOUND CARE) ×2 IMPLANT
ELECT REM PT RETURN 9FT ADLT (ELECTROSURGICAL) ×2
ELECTRODE REM PT RTRN 9FT ADLT (ELECTROSURGICAL) ×1 IMPLANT
GLOVE BIOGEL PI IND STRL 6.5 (GLOVE) ×1 IMPLANT
GLOVE BIOGEL PI IND STRL 8 (GLOVE) ×1 IMPLANT
GLOVE BIOGEL PI INDICATOR 6.5 (GLOVE) ×1
GLOVE BIOGEL PI INDICATOR 8 (GLOVE) ×1
GLOVE ECLIPSE 6.5 STRL STRAW (GLOVE) ×2 IMPLANT
GLOVE INDICATOR 6.5 STRL GRN (GLOVE) ×2 IMPLANT
GLOVE SURG SS PI 8.0 STRL IVOR (GLOVE) ×4 IMPLANT
GOWN PREVENTION PLUS LG XLONG (DISPOSABLE) ×2 IMPLANT
GOWN STRL REIN XL XLG (GOWN DISPOSABLE) ×2 IMPLANT
KIT BASIN OR (CUSTOM PROCEDURE TRAY) ×2 IMPLANT
KIT POSITIONING SURG ANDREWS (MISCELLANEOUS) ×2 IMPLANT
MANIFOLD NEPTUNE II (INSTRUMENTS) ×2 IMPLANT
NEEDLE SPNL 18GX3.5 QUINCKE PK (NEEDLE) ×6 IMPLANT
PATTIES SURGICAL .5 X.5 (GAUZE/BANDAGES/DRESSINGS) IMPLANT
PATTIES SURGICAL .75X.75 (GAUZE/BANDAGES/DRESSINGS) IMPLANT
PATTIES SURGICAL 1X1 (DISPOSABLE) IMPLANT
SPONGE LAP 18X18 X RAY DECT (DISPOSABLE) ×2 IMPLANT
SPONGE LAP 4X18 X RAY DECT (DISPOSABLE) ×2 IMPLANT
SPONGE SURGIFOAM ABS GEL 100 (HEMOSTASIS) ×2 IMPLANT
STAPLER VISISTAT (STAPLE) IMPLANT
STRIP CLOSURE SKIN 1/2X4 (GAUZE/BANDAGES/DRESSINGS) ×2 IMPLANT
SUT PROLENE 3 0 PS 2 (SUTURE) IMPLANT
SUT VIC AB 0 CT1 27 (SUTURE) ×2
SUT VIC AB 0 CT1 27XBRD ANTBC (SUTURE) ×1 IMPLANT
SUT VIC AB 1 CT1 27 (SUTURE) ×2
SUT VIC AB 1 CT1 27XBRD ANTBC (SUTURE) ×1 IMPLANT
SUT VIC AB 1-0 CT2 27 (SUTURE) IMPLANT
SUT VIC AB 2-0 CT1 27 (SUTURE) ×2
SUT VIC AB 2-0 CT1 TAPERPNT 27 (SUTURE) ×1 IMPLANT
SUT VIC AB 2-0 CT2 27 (SUTURE) ×2 IMPLANT
SUT VICRYL 0 27 CT2 27 ABS (SUTURE) IMPLANT
SUT VICRYL 0 UR6 27IN ABS (SUTURE) IMPLANT
SYRINGE 10CC LL (SYRINGE) ×4 IMPLANT
TRAY LAMINECTOMY (CUSTOM PROCEDURE TRAY) ×2 IMPLANT
YANKAUER SUCT BULB TIP NO VENT (SUCTIONS) ×2 IMPLANT

## 2011-04-23 NOTE — Transfer of Care (Signed)
Immediate Anesthesia Transfer of Care Note  Patient: [name]  Procedure(s) Performed: Procedure(s) (LRB): LUMBAR LAMINECTOMY/DECOMPRESSION MICRODISCECTOMY (N/A)  Patient Location: PACU  Anesthesia Type: General  Level of Consciousness: awake, alert  and oriented  Airway & Oxygen Therapy: Patient Spontanous Breathing and Patient connected to face mask oxygen  Post-op Assessment: Report given to PACU RN and Post -op Vital signs reviewed and stable  Post vital signs: Reviewed and stable  Complications: No apparent anesthesia complications

## 2011-04-23 NOTE — Anesthesia Postprocedure Evaluation (Signed)
Anesthesia Post Note  Patient: [name]  Procedure(s) Performed: Procedure(s) (LRB): LUMBAR LAMINECTOMY/DECOMPRESSION MICRODISCECTOMY (N/A)  Anesthesia type: General  Patient location: PACU  Post pain: Pain level controlled  Post assessment: Post-op Vital signs reviewed  Last Vitals:  Filed Vitals:   04/23/11 1300  BP: 134/83  Pulse:   Temp: 36.8 C  Resp:     Post vital signs: Reviewed  Level of consciousness: sedated  Complications: No apparent anesthesia complications

## 2011-04-23 NOTE — Preoperative (Signed)
Beta Blockers   Reason not to administer Beta Blockers:pt took coreg this am 0600

## 2011-04-23 NOTE — Brief Op Note (Signed)
04/23/2011  12:26 PM  PATIENT:  [name]  61 y.o. male  PRE-OPERATIVE DIAGNOSIS:  herniated disc lumbar 5 - sacral 1  POST-OPERATIVE DIAGNOSIS:  herniated disc lumbar 5 - sacral 1  PROCEDURE:  Procedure(s) (LRB): LUMBAR LAMINECTOMY/DECOMPRESSION MICRODISCECTOMY (N/A)  SURGEON:  Surgeon(s) and Role:    * Javier Docker, MD - Primary  PHYSICIAN ASSISTANT:   ASSISTANTS: Strader   ANESTHESIA:   general  EBL:  Total I/O In: 1000 [I.V.:1000] Out: 50 [Blood:50]  BLOOD ADMINISTERED:none  DRAINS: none   LOCAL MEDICATIONS USED:  MARCAINE     SPECIMEN:  No Specimen  DISPOSITION OF SPECIMEN:  N/A  COUNTS:  YES  TOURNIQUET:  * No tourniquets in log *  DICTATION: .Other Dictation: Dictation Number [id number]  PLAN OF CARE: Admit for overnight observation  PATIENT DISPOSITION:  PACU - hemodynamically stable.   Delay start of Pharmacological VTE agent (>24hrs) due to surgical blood loss or risk of bleeding: yes

## 2011-04-23 NOTE — H&P (Signed)
[name] is an 61 y.o. male.   Chief Complaint: left leg pain HPI: HNP L5S1 refractory  Past Medical History  Diagnosis Date  . HIV disease 1990    [facility] H[facility]ER [facility] [place] [facility]  . Hypertension   . HEMOCHROMATOSIS, HX OF 12/15/2005    phlebot[facility] every 6 to 8 weeks--[facility]  . GOUT 06/22/2006  . GILBERT'S SYNDROME 02/25/2007  . DIABETES MELLITUS, TYPE II, UNCONTROLLED 06/22/2006  . Diabetes mellitus     on oral meds-no insulin  . HNP (herniated nucleus pulposus)     LUMBAR PAIN WITH PAIN DOWN LEFT LEG TO TOES-SOME NUMBNESS AND TINGLING IN BIG TOE  . Complication of anesthesia     ANESTHESIA AWARENESS  DURING ADRENALECTOMY AND COLONOSCOPY    Past Surgical History  Procedure Date  . Cholecystectomy   . Adrenalectomy     RIGHT ADRENALECTOMY   . Orif left shoulder     STILL HAS HARDWARE IN THE SHOULDER    Family History  Problem Relation Age of Onset  . Hypertension Mother   . Hyperlipidemia Mother   . Atrial fibrillation Mother   . Hyperlipidemia Father   . Hypertension Father   . Atrial fibrillation Father    Social History:  reports that he has never smoked. He has never used smokeless tobacco. He reports that he does not drink alcohol or use illicit drugs.  Allergies:  Allergies  Allergen Reactions  . Codeine Itching  . Meperidine Hcl Itching  . Scopolamine     GIVEN WITH A SURGERY YRS AGO--CAUSED HALLUNCINATIONS    Medications Prior to Admission  Medication Dose Route Frequency Provider Last Rate Last Dose  . ceFAZolin (ANCEF) IVPB 2 g/50 mL premix  2 g Intravenous 60 min Pre-Op Joshua Graham, PA      . chlorhexidine (HIBICLENS) 4 % liquid 4 application  60 mL Topical Once Joshua Graham, PA      . insulin aspart (novoLOG) injection 0-15 Units  0-15 Units Subcutaneous Q4H Joshua Pheasant, MD   5 Units at 04/23/11 [phone]  . lactated ringers infusion   Intravenous Continuous Joshua Graham, PA       . DISCONTD: clindamycin (CLEOCIN) IVPB 600 mg  600 mg Intravenous Q6H Joshua Graham, PA       Medications Prior to Admission  Medication Sig Dispense Refill  . allopurinol (ZYLOPRIM) 300 MG tablet Take 300 mg by mouth daily.       Marland Kitchen AMLODIPINE BESYLATE PO Take 10 mg by mouth daily before breakfast.       . carvedilol (COREG) 25 MG tablet Take 25 mg by mouth 2 (two) times daily with a meal.       . colchicine 0.6 MG tablet Take 0.12 mg by mouth daily.       Marland Kitchen efavirenz-emtrictabine-tenofovir (ATRIPLA) 600-200-300 MG per tablet Take 1 tablet by mouth at bedtime.  30 tablet  12  . metFORMIN (GLUCOPHAGE) 500 MG tablet Take 1 tablet (500 mg total) by mouth 2 (two) times daily with a meal.  180 tablet  3  . telmisartan (MICARDIS) 80 MG tablet Take 80 mg by mouth daily before breakfast.       . glucose blood (ACCU-CHEK AVIVA) test strip 1 each by Other route as needed. Use as instructed       . Lancets (ACCU-CHEK MULTICLIX) lancets 1 each by Other route as needed. Use as instructed  No results found for this or any previous visit (from the past 48 hour(s)). No results found.  Review of Systems  Constitutional: Negative.   HENT: Negative.   Eyes: Negative.   Respiratory: Negative.   Cardiovascular: Negative.   Musculoskeletal: Positive for back pain.  Skin: Negative.   Neurological: Positive for tingling and focal weakness.  Endo/Heme/Allergies: Negative.   Psychiatric/Behavioral: Negative.     Blood pressure 157/94, pulse 112, temperature 98.4 F (36.9 C), resp. rate 20, SpO2 97.00%. Physical Exam  Constitutional: He is oriented to person, place, and time. He appears well-developed.  HENT:  Head: Normocephalic.  Eyes: Pupils are equal, round, and reactive to light.  Neck: Normal range of motion.  Cardiovascular: Normal rate.   Respiratory: Effort normal.  GI: Soft.  Musculoskeletal: He exhibits tenderness.       Positive SLR left. EHL 5-/5. Decreased sensation  S1  Neurological: He is alert and oriented to person, place, and time.  Skin: Skin is warm.  Psychiatric: He has a normal mood and affect.     Assessment/Plan HNP L5S1 left. Plan decompression left. Risks discussed.  [name] C 04/23/2011, 10:38 AM

## 2011-04-23 NOTE — Anesthesia Preprocedure Evaluation (Signed)
Anesthesia Evaluation  Patient identified by MRN, date of birth, ID band Patient awake    Reviewed: Allergy & Precautions, H&P , NPO status , Patient's Chart, lab work & pertinent test results  History of Anesthesia Complications (+) AWARENESS UNDER ANESTHESIA  Airway Mallampati: III TM Distance: >3 FB Neck ROM: Full    Dental  (+) Teeth Intact and Dental Advisory Given   Pulmonary neg pulmonary ROS,  clear to auscultation  Pulmonary exam normal       Cardiovascular hypertension, neg cardio ROS Regular Normal    Neuro/Psych Negative Neurological ROS  Negative Psych ROS   GI/Hepatic negative GI ROS, Neg liver ROS,   Endo/Other  Negative Endocrine ROSDiabetes mellitus-, Well Controlled, Type 2, Oral Hypoglycemic Agents  Renal/GU negative Renal ROSPrior R partial nephrectomy with adrenalectomy  Genitourinary negative   Musculoskeletal negative musculoskeletal ROS (+)   Abdominal (+) obese,   Peds  Hematology negative hematology ROS (+) HIV, Hemochromatosis; multiple phlebotomys   Anesthesia Other Findings   Reproductive/Obstetrics negative OB ROS                           Anesthesia Physical Anesthesia Plan  ASA: III  Anesthesia Plan: General   Post-op Pain Management:    Induction: Intravenous  Airway Management Planned: Oral ETT  Additional Equipment:   Intra-op Plan:   Post-operative Plan: Extubation in OR  Informed Consent: I have reviewed the patients History and Physical, chart, labs and discussed the procedure including the risks, benefits and alternatives for the proposed anesthesia with the patient or authorized representative who has indicated his/her understanding and acceptance.   Dental advisory given  Plan Discussed with: CRNA  Anesthesia Plan Comments:         Anesthesia Quick Evaluation

## 2011-04-24 LAB — GLUCOSE, CAPILLARY
Glucose-Capillary: 209 mg/dL — ABNORMAL HIGH (ref 70–99)
Glucose-Capillary: 181 mg/dL — ABNORMAL HIGH (ref 70–99)
Glucose-Capillary: 235 mg/dL — ABNORMAL HIGH (ref 70–99)
Glucose-Capillary: 152 mg/dL — ABNORMAL HIGH (ref 70–99)

## 2011-04-24 MED ORDER — OXYCODONE-ACETAMINOPHEN 5-325 MG PO TABS: 1.0000 | ORAL_TABLET | ORAL | Status: AC | PRN

## 2011-04-24 MED ORDER — METHOCARBAMOL 500 MG PO TABS: 500.0000 mg | ORAL_TABLET | Freq: Four times a day (QID) | ORAL | Status: AC | PRN

## 2011-04-24 MED ORDER — OXYCODONE-ACETAMINOPHEN 5-325 MG PO TABS
1.0000 | ORAL_TABLET | ORAL | Status: DC | PRN
  Administered 2011-04-24 – 2011-04-25 (×2): 2 via ORAL
  Filled 2011-04-24: qty 2

## 2011-04-24 MED ORDER — DSS 100 MG PO CAPS: 100.0000 mg | ORAL_CAPSULE | Freq: Two times a day (BID) | ORAL | Status: AC

## 2011-04-24 NOTE — Evaluation (Signed)
Physical Therapy Evaluation Patient Details Name: [name] MRN: [id number] DOB: Aug 17, 1950 Today's Date: 04/24/2011  Problem List:  Patient Active Problem List  Diagnoses  . HIV DISEASE  . DIABETES MELLITUS, TYPE II, UNCONTROLLED  . GOUT  . GILBERT'S SYNDROME  . HYPERTENSION  . HEMOCHROMATOSIS, HX OF  . Hyperlipidemia    Past Medical History:  Past Medical History  Diagnosis Date  . HIV disease 1990    [facility]  . Hypertension   . HEMOCHROMATOSIS, HX OF 12/15/2005    phlebotomy every 6 to 8 weeks--[facility]  . GOUT 06/22/2006  . GILBERT'S SYNDROME 02/25/2007  . DIABETES MELLITUS, TYPE II, UNCONTROLLED 06/22/2006  . Diabetes mellitus     on oral meds-no insulin  . HNP (herniated nucleus pulposus)     LUMBAR PAIN WITH PAIN DOWN LEFT LEG TO TOES-SOME NUMBNESS AND TINGLING IN BIG TOE  . Complication of anesthesia     ANESTHESIA AWARENESS  DURING ADRENALECTOMY AND COLONOSCOPY   Past Surgical History:  Past Surgical History  Procedure Date  . Cholecystectomy   . Adrenalectomy     RIGHT ADRENALECTOMY   . Orif left shoulder     STILL HAS HARDWARE IN THE SHOULDER    PT Assessment/Plan/Recommendation PT Assessment Clinical Impression Statement: pt feels he will be fine without RW for home, used today for increased support/pain control; pt doing well upon PT departure; VSS BP 137/81; HR 84; Sats 96% PT Recommendation/Assessment: Patent does not need any further PT services No Skilled PT: All education completed;Patient will have necessary level of assist by caregiver at discharge PT Recommendation Equipment Recommended: None recommended by PT PT Goals     PT Evaluation Precautions/Restrictions  Precautions Precautions: Back, issued handout Prior Functioning  Home Living Lives With: Alone Receives Help From: Family (parents) will D/C to parents' home Type of Home: House Home Layout: One level Home  Access: Stairs to enter Entrance Stairs-Rails: None Entrance Stairs-Number of Steps: 1 Prior Function Level of Independence: Independent with basic ADLs;Independent with gait;Independent with transfers Able to Take Stairs?: Yes Driving: Yes Cognition Cognition Arousal/Alertness: Awake/alert Overall Cognitive Status: Appears within functional limits for tasks assessed Orientation Level: Oriented X4 Sensation/Coordination Sensation Light Touch: Appears Intact (pt reports slight numbness distal LE) Proprioception: Appears Intact Extremity Assessment RUE Assessment RUE Assessment: Within Functional Limits LUE Assessment LUE Assessment: Within Functional Limits RLE Assessment RLE Assessment: Within Functional Limits LLE Assessment LLE Assessment: Within Functional Limits Mobility (including Balance) Bed Mobility Bed Mobility: Yes Rolling Left: 5: Supervision;4: Min assist Rolling Left Details (indicate cue type and reason): cues to log roll Left Sidelying to Sit: 5: Supervision;4: Min assist Left Sidelying to Sit Details (indicate cue type and reason): min/guard; cues for back precautions Transfers Transfers: Yes Sit to Stand: 5: Supervision;From bed;With upper extremity assist Sit to Stand Details (indicate cue type and reason): cues for back precautions Stand to Sit: 5: Supervision;With upper extremity assist;To chair/3-in-1 Stand to Sit Details: cues for back precautions Ambulation/Gait Ambulation/Gait: Yes Ambulation/Gait Assistance: 4: Min assist;5: Supervision Ambulation/Gait Assistance Details (indicate cue type and reason): cues to depress scapula; cues for RW use and breathing Ambulation Distance (Feet): 120 Feet (dizzy after amb, chair to pt ) Assistive device: Rolling walker Gait Pattern: Within Functional Limits    Exercise    End of Session PT - End of Session Activity Tolerance: Patient tolerated treatment well Patient left: in chair;with call bell in  reach General Behavior  During Session: [facility] for tasks performed Cognition: [facility] for tasks performed  [facility] 04/24/2011, 9:33 AM

## 2011-04-24 NOTE — Evaluation (Signed)
Occupational Therapy Evaluation Patient Details Name: [name] MRN: [id number] DOB: [birth year] Today's Date: 04/24/2011  Problem List:  Patient Active Problem List  Diagnoses  . HIV DISEASE  . DIABETES MELLITUS, TYPE II, UNCONTROLLED  . GOUT  . GILBERT'S SYNDROME  . HYPERTENSION  . HEMOCHROMATOSIS, HX OF  . Hyperlipidemia    Past Medical History:  Past Medical History  Diagnosis Date  . HIV disease 1990    [facility]. HATCHER [facility]  . Hypertension   . HEMOCHROMATOSIS, HX OF 12/15/2005    phlebotomy every 6 to 8 weeks--[facility]  . GOUT 06/22/2006  . GILBERT'S SYNDROME 02/25/2007  . DIABETES MELLITUS, TYPE II, UNCONTROLLED 06/22/2006  . Diabetes mellitus     on oral meds-no insulin  . HNP (herniated nucleus pulposus)     LUMBAR PAIN WITH PAIN DOWN LEFT LEG TO TOES-SOME NUMBNESS AND TINGLING IN BIG TOE  . Complication of anesthesia     ANESTHESIA AWARENESS  DURING ADRENALECTOMY AND COLONOSCOPY   Past Surgical History:  Past Surgical History  Procedure Date  . Cholecystectomy   . Adrenalectomy     RIGHT ADRENALECTOMY   . Orif left shoulder     STILL HAS HARDWARE IN THE SHOULDER    OT Assessment/Plan/Recommendation OT Assessment Clinical Impression Statement: Pt will benefit from one more session of OT if still here after today but doing well and feel he can discharge home if dizziness resolves while on acute.  OT Recommendation/Assessment: Patient will need skilled OT in the acute care venue OT Problem List: Decreased strength;Pain;Decreased activity tolerance;Decreased knowledge of use of DME or AE OT Therapy Diagnosis : Generalized weakness;Acute pain OT Plan OT Frequency: Min 2X/week OT Treatment/Interventions: Self-care/ADL training;Therapeutic activities;DME and/or AE instruction;Patient/family education OT Recommendation Follow Up Recommendations: No OT follow up Equipment Recommended: 3 in 1 bedside  comode Individuals Consulted Consulted and Agree with Results and Recommendations: Patient OT Goals Acute Rehab OT Goals OT Goal Formulation: With patient Time For Goal Achievement: 7 days ADL Goals Pt Will Perform Lower Body Dressing: with supervision;Sit to stand from bed;Sit to stand from chair ADL Goal: Lower Body Dressing - Progress: Goal set today Pt Will Transfer to Toilet: with supervision;Ambulation;3-in-1 ADL Goal: Toilet Transfer - Progress: Goal set today Pt Will Perform Toileting - Clothing Manipulation: with supervision;Standing ADL Goal: Toileting - Clothing Manipulation - Progress: Goal set today Pt Will Perform Tub/Shower Transfer: Shower transfer;with supervision;with DME;Other (comment) (pt states parents may have a shower chair. discussed 3in1 ) ADL Goal: Tub/Shower Transfer - Progress: Goal set today  OT Evaluation Precautions/Restrictions  Precautions Precautions: Back Precaution Comments: issued back care handout with precautions Prior Functioning Home Living Lives With: Alone Receives Help From: Family (parents) Type of Home: House Home Layout: One level Home Access: Stairs to enter Entrance Stairs-Rails: None Entrance Stairs-Number of Steps: 1 Bathroom Shower/Tub: Psychologist, counselling;Other (comment) (his house has built in seat) Firefighter: Handicapped height (at [employer]. His house had standard.) Home Adaptive Equipment: Walker - rolling Additional Comments: pt states parents may have a 3in1 and shower chair. He would like a 3in1 as his toilet at home is a standard and he will be returning there in hopefully a few days. Prior Function Level of Independence: Independent with basic ADLs;Independent with gait;Independent with transfers;Independent with homemaking with ambulation Driving: Yes ADL ADL Eating/Feeding: Simulated;Independent Where Assessed - Eating/Feeding: Chair Grooming: Simulated;Set up Where Assessed - Grooming: Sitting,  chair Upper Body Bathing: Simulated;Right arm;Chest;Left  arm;Abdomen;Supervision/safety;Set up Where Assessed - Upper Body Bathing: Sitting, chair Lower Body Bathing: Minimal assistance Lower Body Bathing Details (indicate cue type and reason): min guard assist for safety due to dizziness with activity intially Where Assessed - Lower Body Bathing: Sit to stand from chair Upper Body Dressing: Simulated;Supervision/safety;Set up Where Assessed - Upper Body Dressing: Sitting, chair Lower Body Dressing: Simulated;Minimal assistance Lower Body Dressing Details (indicate cue type and reason): min guard assist Where Assessed - Lower Body Dressing: Sit to stand from chair Toilet Transfer: Performed;Minimal assistance Toilet Transfer Details (indicate cue type and reason): with RW. Pt states his parent's toilet is higher than the one here. He would like a 3in1 for discharge especially for his home where he is returning in a few days hopefully. Toilet Transfer Method: Proofreader: Comfort height toilet;Grab bars Toileting - Clothing Manipulation: Simulated;Minimal Programmer, applications Details (indicate cue type and reason): min guard assist Where Assessed - Glass blower/designer Manipulation: Standing Toileting - Hygiene: Simulated;Supervision/safety Where Assessed - Toileting Hygiene: Sit on 3-in-1 or toilet Tub/Shower Transfer: Not assessed Tub/Shower Transfer Method: Not assessed Equipment Used: Rolling walker ADL Comments: pt able to state all back precautions. Discussed AE especially the reacher so he can be more independent especially when he returns home alone. Discussed ways to use reacher for laundry, managing bed covers, etc.  Vision/Perception  Vision - History Baseline Vision: No visual deficits Cognition Cognition Arousal/Alertness: Awake/alert Overall Cognitive Status: Appears within functional limits for tasks assessed Orientation  Level: Oriented X4 Sensation/Coordination   Extremity Assessment RUE Assessment RUE Assessment: Within Functional Limits LUE Assessment LUE Assessment: Within Functional Limits Mobility  Transfers Sit to Stand: 5: Supervision;From chair/3-in-1;From toilet;From elevated surface;With upper extremity assist Stand to Sit: 5: Supervision;4: Min assist;To chair/3-in-1;To toilet Stand to Sit Details: min assist for toilet to help descend safely. Pt reports his parent's toilet is higher.  Exercises   End of Session OT - End of Session Equipment Utilized During Treatment: Other (comment) (RW) Activity Tolerance: Other (comment) (pt with some dizziness initially. better with more time.) Patient left: in chair;with call bell in reach General Behavior During Session: [facility] for tasks performed Cognition: [facility] for tasks performed   Lennox Laity [phone] 04/24/2011, 12:23 PM

## 2011-04-24 NOTE — Op Note (Signed)
[name], [name]             ACCOUNT NO.:  [id number]  MEDICAL RECORD NO.:  [id number]  LOCATION:  1524                         FACILITY:  [facility]  PHYSICIAN:  Jene Every, M.D.    DATE OF BIRTH:  March 09, 1951  DATE OF PROCEDURE:  04/23/2011 DATE OF DISCHARGE:                              OPERATIVE REPORT   PREOPERATIVE DIAGNOSIS:  Spinal stenosis and herniated nucleus pulposus at L5-S1, left.  POSTOPERATIVE DIAGNOSES: 1. Spinal stenosis and herniated nucleus pulposus at L5-S1, left. 2. Calcified disk.  PROCEDURE PERFORMED: 1. Lumbar decompression L5-S1 left with foraminotomies, L5 and S1,     left. 2. Lysis of 2 left epidural venous plexus.  ANESTHESIA:  General.  ASSISTANT:  Roma Schanz, P.A.  BRIEF HISTORY:  This is a 61 year old with refractory L5 radiculopathy, mild __________ weakness of the dermatome with dysesthesias, EHL weakness, refractory to conservative treatment.  MRI indicating HNP with stenosis laterally with neural foraminal stenosis at L5, compressed S1 nerve root laterally, indicated for decompression, failing conservative treatment.  Risks and benefits discussed including bleeding, infection, damage to neurovascular structures, no change in symptoms, worsening symptoms, need for repeat debridement, DVT, PE, anesthetic complications, etc.  The patient has a preoperative Kefzol and clindamycin due to history of HIV.  TECHNIQUE:  With the patient in supine position, after induction of adequate general anesthesia, the above-mentioned antibiotics, the lumbar region was prepped and draped in a usual sterile fashion.  An 18-gauge spinal needle was utilized to localize L5-S1 interspace, confirmed with x-ray.  Incision was made from the spinous process of L5-S1. Subcutaneous tissue was dissected.  Electrocautery was utilized to achieve hemostasis.  Paraspinous muscle was elevated from lamina of 5-1 after dorsolumbar fascia divided.  McCullough  retractor was placed. Confirmatory radiograph was obtained with a Penfield in the interlaminar space at L5-S1.  Operating microscope was draped and brought into the surgical field.  Hemilaminotomy of the caudad edge of 5 was performed with 3-mm Kerrison and a 2-mm Kerrison to detach the ligamentum flavum. Ligamentum flavum detached from cephalad edge of S1 utilizing a straight curette.  Neuro patty placed beneath the ligamentum flavum.  Severe lateral recess stenosis was noted.  Decompressed the S1 nerve root laterally to the facet and ligamentum flavum hypertrophy.  Foraminotomy of S1 was performed.  Identified the S1 nerve root, gently mobilized it medially.  There was a calcified disk of the entire disk space as well as facet hypertrophy __________ lateral recess stenosis.  We decompressed the lateral recess to the medial border of pedicle with a 2- mm Kerrison, performed a foraminotomy of L5.  Protecting the 5 root. There was ligamentum flavum hypertrophy and an osteophyte off the superior articulating facet of S1 generating the foraminal stenosis. After foraminotomy and decompression laterally, we addressed the disk. Attempt was made to enter the disk space utilizing an Epstein, however, was fairly osteophyte, and was felt to allow the hardened disk in situ __________ with predisposed space into rupture or a cancellous surface, over which the nerve root may become scarred.  Given that the S1 nerve root had an excursion of 1 cm medial to the pedicle without difficulty. The hockey-stick probe was placed freely up the  foramen L5-S1.  I felt it was well decompressed.  The vascular __________ S1 nerve root was well visualized and cauterized.  This improved the mobilization of both wrists.  Disk space copiously irrigated with antibiotic irrigation.  No CSF leakage or active bleeding.  McCullough retractor was removed. Dorsolumbar fascia reapproximated with 1 Vicryl in a  figure-of-eight sutures, subcu with 2-0 Vicryl simple sutures.  Skin was reapproximated with 4-0 subcuticular Prolene, irrigated all levels.  The wound was dressed sterilely, placed supine on hospital bed, extubated without difficulty, and transported to recovery in a satisfactory condition.  The patient tolerated the procedure well with no complications. Assistant was [employer].  BLOOD LOSS:  10 cc.     Jene Every, M.D.     Cordelia Pen  D:  04/23/2011  T:  04/24/2011  Job:  [id number]

## 2011-04-24 NOTE — Progress Notes (Signed)
OT Cancellation Note  ___Treatment cancelled today due to medical issues with patient which prohibited therapy  ___ Treatment cancelled today due to patient receiving procedure or test   _x__ Treatment cancelled today due to patient's refusal to participate. Pt still reporting dizziness. States he will be here through tomorrow. Will check back 04/25/11   ___ Treatment cancelled today due to   Signature: Garrel Ridgel, OTR/L  Pager [phone] 04/24/2011

## 2011-04-24 NOTE — Progress Notes (Signed)
Subjective: 1 Day Post-Op Procedure(s) (LRB): LUMBAR LAMINECTOMY/DECOMPRESSION MICRODISCECTOMY (N/A) Patient reports pain as 7 on 0-10 scale.  Mainly low back pain but some tingling in left foot.  Has been out of bed.   Denies CP or SOB.  Voiding without difficulty. Positive flatus. Objective: Vital signs in last 24 hours: Temp:  [97.5 F (36.4 C)-98.3 F (36.8 C)] 97.6 F (36.4 C) (02/14 0625) Pulse Rate:  [77-110] 77  (02/14 0625) Resp:  [15-19] 18  (02/14 0625) BP: (112-157)/(68-93) 112/74 mmHg (02/14 0625) SpO2:  [94 %-100 %] 96 % (02/14 0625) Weight:  [103.42 kg (228 lb)] 103.42 kg (228 lb) (02/13 1331)  Intake/Output from previous day: 02/13 0701 - 02/14 0700 In: 3900 [P.O.:1000; I.V.:2700; IV Piggyback:200] Out: 450 [Urine:400; Blood:50] Intake/Output this shift:    No results found for this basename: HGB:5 in the last 72 hours No results found for this basename: WBC:2,RBC:2,HCT:2,PLT:2 in the last 72 hours No results found for this basename: NA:2,K:2,CL:2,CO2:2,BUN:2,CREATININE:2,GLUCOSE:2,CALCIUM:2 in the last 72 hours No results found for this basename: LABPT:2,INR:2 in the last 72 hours  Neurologically intact Neurovascular intact Sensation intact distally Dorsiflexion/Plantar flexion intact Compartment soft  Assessment/Plan: 1 Day Post-Op Procedure(s) (LRB): LUMBAR LAMINECTOMY/DECOMPRESSION MICRODISCECTOMY (N/A) Advance diet Up with therapy D/C IV fluids Will adjust medications and see if doing better then will plan for d/c later today ow will keep until am  Cherokee Clowers R. 04/24/2011, 9:16 AM

## 2011-04-25 LAB — GLUCOSE, CAPILLARY
Glucose-Capillary: 161 mg/dL — ABNORMAL HIGH (ref 70–99)
Glucose-Capillary: 165 mg/dL — ABNORMAL HIGH (ref 70–99)

## 2011-04-25 NOTE — Progress Notes (Signed)
Occupational Therapy Note Spoke with patient who is discharging today. He states he is doing better than yesterday and has no further concerns with any of his ADL and declines need to practice shower transfer. Family present for discharge home. Judithann Sauger OTR/L [phone] 04/25/2011

## 2011-04-25 NOTE — Progress Notes (Signed)
D/C instructions reviewed w/ pt. All questions answered, pt verbalizes understanding and no further questions. Pt awaiting family member for ride home.

## 2011-04-25 NOTE — Progress Notes (Signed)
Subjective: 2 Days Post-Op Procedure(s) (LRB): LUMBAR LAMINECTOMY/DECOMPRESSION MICRODISCECTOMY (N/A) Patient reports pain as 4 on 0-10 scale.    Patient has complaints of some low back pain but much improved  Objective: Vital signs in last 24 hours: Temp:  [97.5 F (36.4 C)-98.9 F (37.2 C)] 98 F (36.7 C) (02/15 0600) Pulse Rate:  [77-97] 89  (02/15 0600) Resp:  [16-18] 16  (02/15 0700) BP: (111-151)/(68-87) 151/83 mmHg (02/15 0700) SpO2:  [95 %-97 %] 96 % (02/15 0600)  Intake/Output from previous day:  Intake/Output Summary (Last 24 hours) at 04/25/11 0754 Last data filed at 04/24/11 1344  Gross per 24 hour  Intake 1502.66 ml  Output      0 ml  Net 1502.66 ml    Intake/Output this shift:    Labs: Results for orders placed during the hospital encounter of 04/23/11  GLUCOSE, CAPILLARY      Component Value Range   Glucose-Capillary 154 (*) 70 - 99 (mg/dL)   Comment 1 Documented in Chart     Comment 2 Notify RN    GLUCOSE, CAPILLARY      Component Value Range   Glucose-Capillary 179 (*) 70 - 99 (mg/dL)  GLUCOSE, CAPILLARY      Component Value Range   Glucose-Capillary 150 (*) 70 - 99 (mg/dL)  GLUCOSE, CAPILLARY      Component Value Range   Glucose-Capillary 194 (*) 70 - 99 (mg/dL)  GLUCOSE, CAPILLARY      Component Value Range   Glucose-Capillary 209 (*) 70 - 99 (mg/dL)   Comment 1 Documented in Chart    GLUCOSE, CAPILLARY      Component Value Range   Glucose-Capillary 181 (*) 70 - 99 (mg/dL)  GLUCOSE, CAPILLARY      Component Value Range   Glucose-Capillary 235 (*) 70 - 99 (mg/dL)  GLUCOSE, CAPILLARY      Component Value Range   Glucose-Capillary 152 (*) 70 - 99 (mg/dL)  GLUCOSE, CAPILLARY      Component Value Range   Glucose-Capillary 161 (*) 70 - 99 (mg/dL)    Exam: Neurologically intact Neurovascular intact Sensation intact distally Dorsiflexion/Plantar flexion intact Incision: dressing C/D/I Compartment soft clean Motor function intact -  moving foot and toes well on exam.   Assessment/Plan: 2 Days Post-Op Procedure(s) (LRB): LUMBAR LAMINECTOMY/DECOMPRESSION MICRODISCECTOMY (N/A)  Procedure(s) (LRB): LUMBAR LAMINECTOMY/DECOMPRESSION MICRODISCECTOMY (N/A) Past Medical History  Diagnosis Date  . HIV disease 1990    [facility]. HATCHER [facility]  . Hypertension   . HEMOCHROMATOSIS, HX OF 12/15/2005    phlebotomy every 6 to 8 weeks--at [facility]  . GOUT 06/22/2006  . GILBERT'S SYNDROME 02/25/2007  . DIABETES MELLITUS, TYPE II, UNCONTROLLED 06/22/2006  . Diabetes mellitus     on oral meds-no insulin  . HNP (herniated nucleus pulposus)     LUMBAR PAIN WITH PAIN DOWN LEFT LEG TO TOES-SOME NUMBNESS AND TINGLING IN BIG TOE  . Complication of anesthesia     ANESTHESIA AWARENESS  DURING ADRENALECTOMY AND COLONOSCOPY   Discharge home today  Dressing supplies for home ASA daily  D/c discussed with patient  {Chamar Broughton R. 04/25/2011, 7:54 AM

## 2011-04-25 NOTE — Discharge Summary (Signed)
Patient ID: [name] MRN: [id number] DOB/AGE: 07/15/1950 61 y.o.  Admit date: 04/23/2011 Discharge date: 04/25/2011  Admission Diagnoses:  HNP/ spinal stenosis L5-S1 Past Medical History  Diagnosis Date  . HIV disease 1990    DR. HATCHER WITH [place]  . Hypertension   . HEMOCHROMATOSIS, HX OF 12/15/2005    phlebotomy every 6 to 8 weeks--at [facility]  . GOUT 06/22/2006  . GILBERT'S SYNDROME 02/25/2007  . DIABETES MELLITUS, TYPE II, UNCONTROLLED 06/22/2006  . Diabetes mellitus     on oral meds-no insulin  . HNP (herniated nucleus pulposus)     LUMBAR PAIN WITH PAIN DOWN LEFT LEG TO TOES-SOME NUMBNESS AND TINGLING IN BIG TOE  . Complication of anesthesia     ANESTHESIA AWARENESS  DURING ADRENALECTOMY AND COLONOSCOPY   Discharge Diagnoses:  Same s/p decompression L5-S1   Surgeries: Procedure(s): LUMBAR LAMINECTOMY/DECOMPRESSION MICRODISCECTOMY on 04/23/2011    Discharged Condition: Improved  Hospital Course: [name] is an 61 y.o. male who was admitted 04/23/2011 for operative treatment of spinal stenosis. Patient has severe unremitting pain that affects sleep, daily activities, and work/hobbies. After pre-op clearance the patient was taken to the operating room on 04/23/2011 and underwent  Procedure(s): LUMBAR LAMINECTOMY/DECOMPRESSION MICRODISCECTOMY.    Patient was given perioperative antibiotics: Anti-infectives     Start     Dose/Rate Route Frequency Ordered Stop   04/23/11 2200   efavirenz-emtrictabine-tenofovir (ATRIPLA) 600-200-300 MG per tablet 1 tablet  Status:  Discontinued        1 tablet Oral Daily at bedtime 04/23/11 1354 04/25/11 1336   04/23/11 1600   clindamycin (CLEOCIN) IVPB 600 mg        600 mg 100 mL/hr over 30 Minutes Intravenous 4 times per day 04/23/11 1354 04/24/11 0601   04/23/11 1500   ceFAZolin (ANCEF) IVPB 1 g/50 mL premix        1 g 100 mL/hr over 30 Minutes Intravenous Every 8 hours  04/23/11 1354 04/23/11 2308   04/23/11 1201   polymyxin B 500,000 Units, bacitracin 50,000 Units in sodium chloride irrigation 0.9 % 500 mL irrigation  Status:  Discontinued          As needed 04/23/11 1201 04/23/11 1237   04/23/11 0830   ceFAZolin (ANCEF) IVPB 2 g/50 mL premix        2 g 100 mL/hr over 30 Minutes Intravenous 60 min pre-op 04/23/11 0821 04/23/11 1109   04/23/11 0821   clindamycin (CLEOCIN) IVPB 600 mg  Status:  Discontinued        600 mg 100 mL/hr over 30 Minutes Intravenous 4 times per day 04/23/11 2952 04/23/11 8413           Patient was given sequential compression devices, early ambulation, and chemoprophylaxis to prevent DVT.  Patient benefited maximally from hospital stay and there were no complications.  He did have significant pain POD #1 but was much improved POD #2 and felt ready for discharge.    Recent vital signs: Patient Vitals for the past 24 hrs:  BP Temp Temp src Pulse Resp SpO2  04/25/11 0700 151/83 mmHg - - - 16  -  04/25/11 0600 132/82 mmHg 98 F (36.7 C) Oral 89  18  96 %  04/25/11 0200 119/70 mmHg 97.8 F (36.6 C) Oral 89  18  96 %  04/24/11 2200 127/72 mmHg 98.9 F (37.2 C) Oral 97  18  95 %  04/24/11 1700 145/87 mmHg 97.8  F (36.6 C) Oral 95  18  96 %     Recent laboratory studies: No results found for this basename: WBC:2,HGB:2,HCT:2,PLT:2,NA:2,K:2,CL:2,CO2:2,BUN:2,CREATININE:2,GLUCOSE:2,PT:2,INR:2,CALCIUM,2: in the last 72 hours   Discharge Medications:   Medication List  As of 04/25/2011  3:52 PM   STOP taking these medications         HYDROcodone-acetaminophen 5-325 MG per tablet         TAKE these medications         ACCU-CHEK AVIVA test strip   Generic drug: glucose blood   1 each by Other route as needed. Use as instructed      accu-chek multiclix lancets   1 each by Other route as needed. Use as instructed      allopurinol 300 MG tablet   Commonly known as: ZYLOPRIM   Take 300 mg by mouth daily.      AMLODIPINE  BESYLATE PO   Take 10 mg by mouth daily before breakfast.      carvedilol 25 MG tablet   Commonly known as: COREG   Take 25 mg by mouth 2 (two) times daily with a meal.      colchicine 0.6 MG tablet   Take 1.2 mg by mouth daily.      DSS 100 MG Caps   Take 100 mg by mouth 2 (two) times daily.      efavirenz-emtrictabine-tenofovir 600-200-300 MG per tablet   Commonly known as: ATRIPLA   Take 1 tablet by mouth at bedtime.      meloxicam 15 MG tablet   Commonly known as: MOBIC   Take 7.5 mg by mouth daily.      metFORMIN 500 MG tablet   Commonly known as: GLUCOPHAGE   Take 1 tablet (500 mg total) by mouth 2 (two) times daily with a meal.      methocarbamol 500 MG tablet   Commonly known as: ROBAXIN   Take 1 tablet (500 mg total) by mouth every 6 (six) hours as needed.      oxyCODONE-acetaminophen 5-325 MG per tablet   Commonly known as: PERCOCET   Take 1-2 tablets by mouth every 4 (four) hours as needed.      telmisartan 80 MG tablet   Commonly known as: MICARDIS   Take 80 mg by mouth daily before breakfast.            Diagnostic Studies: Dg Chest 2 View  04/18/2011  *RADIOLOGY REPORT*  Clinical Data: Preop for lumbar spine surgery  CHEST - 2 VIEW  Comparison: Chest x-ray of 08/06/2008  Findings: The lungs are clear.  The heart is mildly enlarged. There are degenerative changes throughout the thoracic spine. Multiple surgical clips are present in the right upper quadrant.  IMPRESSION: Stable chest x-ray with mild cardiomegaly.  No active lung disease.  Original Report Authenticated By: Juline Patch, M.D.   Dg Lumbar Spine 2-3 Views  04/18/2011  *RADIOLOGY REPORT*  Clinical Data: Back pain.  Preop lumbar decompression.  LUMBAR SPINE - 2-3 VIEW  Comparison: None.  Findings: Multiple surgical clips right retroperitoneum.  Five lumbar type vertebral bodies with anatomic alignment.  Disc space narrowing L5-S1.  Lower lumbar facet arthropathy.  IMPRESSION: Films numbered in  accordance with the convention of five lumbar type vertebral bodies.  Please correlate with cross-sectional imaging prior to surgical or procedural intervention.  Original Report Authenticated By: Elsie Stain, M.D.   Dg Spine Portable 1 View  04/23/2011  *RADIOLOGY REPORT*  Clinical Data: L5 and  S1 surgery.  PORTABLE SPINE - 1 VIEW  Comparison: 04/23/2011.  04/18/2011.  Findings: Cross-table lateral view of the lumbar spine demonstrates posterior surgical instruments at the L5-S1 level.  IMPRESSION: Intraoperative localization of L5-S1.  Original Report Authenticated By: Cyndie Chime, M.D.   Dg Spine Portable 1 View  04/23/2011  *RADIOLOGY REPORT*  Clinical Data: Back pain  PORTABLE SPINE - 1 VIEW  Comparison: Plain films 04/18/2011  Findings: Intraoperative lateral spine film demonstrates a needle in the soft tissues of the back directed most closely toward the L5- S1 level.  IMPRESSION: As above.  Original Report Authenticated By: Elsie Stain, M.D.    Disposition: Home or Self Care  Discharge Orders    Future Orders Please Complete By Expires   Diet - low sodium heart healthy      Call MD / Call 911      Comments:   If you experience chest pain or shortness of breath, CALL 911 and be transported to the hospital emergency room.  If you develope a fever above 101 F, pus (white drainage) or increased drainage or redness at the wound, or calf pain, call your surgeon's office.   Constipation Prevention      Comments:   Drink plenty of fluids.  Prune juice may be helpful.  You may use a stool softener, such as Colace (over the counter) 100 mg twice a day.  Use MiraLax (over the counter) for constipation as needed.   Increase activity slowly as tolerated      Discharge instructions      Comments:   Walk As Tolerated utilizing back precautions.  No bending, twisting, or lifting.  No driving for 2 weeks.  Ok to shower in 72 hours.  Use good body mechanics. Change dressing daily.       Follow-up Information    Follow up with BEANE,JEFFREY C, MD in 14 days.   Contact information:   [facility] [address] [place] [address] [phone]           Signed: Liam Graham. 04/25/2011, 3:52 PM

## 2011-04-25 NOTE — Progress Notes (Signed)
Pt d/c in w/c by NT and nursing student. Pt in stable condition, in possession of d/c instructions, scripts, and all personal belongings.

## 2011-05-12 ENCOUNTER — Encounter (HOSPITAL_COMMUNITY): Payer: Self-pay | Admitting: Specialist

## 2011-07-02 ENCOUNTER — Other Ambulatory Visit: Payer: Self-pay | Admitting: Internal Medicine

## 2011-10-10 ENCOUNTER — Other Ambulatory Visit (HOSPITAL_BASED_OUTPATIENT_CLINIC_OR_DEPARTMENT_OTHER): Payer: Self-pay | Admitting: Gastroenterology

## 2011-10-10 DIAGNOSIS — R945 Abnormal results of liver function studies: Secondary | ICD-10-CM

## 2011-10-10 DIAGNOSIS — R7989 Other specified abnormal findings of blood chemistry: Secondary | ICD-10-CM

## 2011-10-14 ENCOUNTER — Ambulatory Visit (HOSPITAL_BASED_OUTPATIENT_CLINIC_OR_DEPARTMENT_OTHER)
Admission: RE | Admit: 2011-10-14 | Discharge: 2011-10-14 | Disposition: A | Discharge status: Home / Self Care | Payer: 59 | Source: Ambulatory Visit | Attending: Gastroenterology | Admitting: Gastroenterology

## 2011-10-14 DIAGNOSIS — B20 Human immunodeficiency virus [HIV] disease: Secondary | ICD-10-CM | POA: Insufficient documentation

## 2011-10-14 DIAGNOSIS — R945 Abnormal results of liver function studies: Secondary | ICD-10-CM

## 2011-10-14 DIAGNOSIS — C228 Malignant neoplasm of liver, primary, unspecified as to type: Secondary | ICD-10-CM | POA: Insufficient documentation

## 2011-10-14 DIAGNOSIS — R7989 Other specified abnormal findings of blood chemistry: Secondary | ICD-10-CM | POA: Insufficient documentation

## 2011-10-14 DIAGNOSIS — I10 Essential (primary) hypertension: Secondary | ICD-10-CM | POA: Insufficient documentation

## 2011-10-14 DIAGNOSIS — E119 Type 2 diabetes mellitus without complications: Secondary | ICD-10-CM | POA: Insufficient documentation

## 2012-01-28 ENCOUNTER — Other Ambulatory Visit: Payer: Self-pay | Admitting: *Deleted

## 2012-01-28 ENCOUNTER — Encounter: Payer: Self-pay | Admitting: *Deleted

## 2012-01-28 DIAGNOSIS — B20 Human immunodeficiency virus [HIV] disease: Secondary | ICD-10-CM

## 2012-01-28 MED ORDER — EFAVIRENZ-EMTRICITAB-TENOFOVIR 600-200-300 MG PO TABS: 1.0000 | ORAL_TABLET | Freq: Every day | ORAL | Status: DC

## 2012-01-28 NOTE — Telephone Encounter (Signed)
Left voice message for patient to call [facility] for Lab Work and MD appointments.  Last MD appointment, 02/05/11.  Sent letter to patient with this information as well.

## 2012-02-02 ENCOUNTER — Other Ambulatory Visit (INDEPENDENT_AMBULATORY_CARE_PROVIDER_SITE_OTHER): Payer: 59

## 2012-02-02 DIAGNOSIS — Z113 Encounter for screening for infections with a predominantly sexual mode of transmission: Secondary | ICD-10-CM

## 2012-02-02 DIAGNOSIS — E785 Hyperlipidemia, unspecified: Secondary | ICD-10-CM

## 2012-02-02 DIAGNOSIS — B20 Human immunodeficiency virus [HIV] disease: Secondary | ICD-10-CM

## 2012-02-02 LAB — COMPREHENSIVE METABOLIC PANEL
ALT: 40 U/L (ref 0–53)
AST: 46 U/L — ABNORMAL HIGH (ref 0–37)
Albumin: 4.7 g/dL (ref 3.5–5.2)
Alkaline Phosphatase: 142 U/L — ABNORMAL HIGH (ref 39–117)
BUN: 12 mg/dL (ref 6–23)
CO2: 22 mEq/L (ref 19–32)
Calcium: 9.6 mg/dL (ref 8.4–10.5)
Chloride: 102 mEq/L (ref 96–112)
Creat: 0.89 mg/dL (ref 0.50–1.35)
Glucose, Bld: 271 mg/dL — ABNORMAL HIGH (ref 70–99)
Potassium: 4.5 mEq/L (ref 3.5–5.3)
Sodium: 137 mEq/L (ref 135–145)
Total Bilirubin: 0.7 mg/dL (ref 0.3–1.2)
Total Protein: 7.1 g/dL (ref 6.0–8.3)

## 2012-02-02 LAB — LIPID PANEL
Cholesterol: 211 mg/dL — ABNORMAL HIGH (ref 0–200)
HDL: 32 mg/dL — ABNORMAL LOW (ref >39)
Total CHOL/HDL Ratio: 6.6 Ratio
Triglycerides: 1523 mg/dL — ABNORMAL HIGH (ref <150)

## 2012-02-02 LAB — CBC
HCT: 42.4 % (ref 39.0–52.0)
Hemoglobin: 15.2 g/dL (ref 13.0–17.0)
MCH: 34.8 pg — ABNORMAL HIGH (ref 26.0–34.0)
MCHC: 35.8 g/dL (ref 30.0–36.0)
MCV: 97 fL (ref 78.0–100.0)
Platelets: 174 10*3/uL (ref 150–400)
RBC: 4.37 MIL/uL (ref 4.22–5.81)
RDW: 13 % (ref 11.5–15.5)
WBC: 7.1 10*3/uL (ref 4.0–10.5)

## 2012-02-02 LAB — RPR

## 2012-02-03 LAB — T-HELPER CELL (CD4) - (RCID CLINIC ONLY)
CD4 % Helper T Cell: 20 % — ABNORMAL LOW (ref 33–55)
CD4 T Cell Abs: 580 uL (ref 400–2700)

## 2012-02-03 LAB — HIV-1 RNA QUANT-NO REFLEX-BLD
HIV 1 RNA Quant: <20 copies/mL (ref <20)
HIV-1 RNA Quant, Log: <1.3 {Log} (ref <1.30)

## 2012-02-16 ENCOUNTER — Ambulatory Visit: Payer: 59 | Admitting: Infectious Diseases

## 2012-02-16 ENCOUNTER — Encounter: Payer: Self-pay | Admitting: Infectious Diseases

## 2012-02-16 ENCOUNTER — Ambulatory Visit (INDEPENDENT_AMBULATORY_CARE_PROVIDER_SITE_OTHER): Payer: 59 | Admitting: Infectious Diseases

## 2012-02-16 VITALS — BP 121/74 | HR 94 | Temp 98.1°F | Ht 69.0 in | Wt 219.0 lb

## 2012-02-16 DIAGNOSIS — Z23 Encounter for immunization: Secondary | ICD-10-CM

## 2012-02-16 DIAGNOSIS — IMO0001 Reserved for inherently not codable concepts without codable children: Secondary | ICD-10-CM

## 2012-02-16 DIAGNOSIS — E785 Hyperlipidemia, unspecified: Secondary | ICD-10-CM

## 2012-02-16 DIAGNOSIS — Z113 Encounter for screening for infections with a predominantly sexual mode of transmission: Secondary | ICD-10-CM

## 2012-02-16 DIAGNOSIS — Z79899 Other long term (current) drug therapy: Secondary | ICD-10-CM

## 2012-02-16 DIAGNOSIS — B20 Human immunodeficiency virus [HIV] disease: Secondary | ICD-10-CM

## 2012-02-16 MED ORDER — EFAVIRENZ-EMTRICITAB-TENOFOVIR 600-200-300 MG PO TABS: 1.0000 | ORAL_TABLET | Freq: Every day | ORAL | Status: DC

## 2012-02-16 NOTE — Assessment & Plan Note (Signed)
Will f/u with Dr Cato Mulligan, not clear if he may need insulin now. Greatly appreciate partnering with Dr Cato Mulligan and Dr Abel Presto.

## 2012-02-16 NOTE — Assessment & Plan Note (Signed)
Have asked him to discuss starting therapy for this with Dr Cato Mulligan.

## 2012-02-16 NOTE — Addendum Note (Signed)
Addended by: Andree Coss on: 02/16/2012 09:49 AM   Modules accepted: Orders

## 2012-02-16 NOTE — Assessment & Plan Note (Signed)
He is doing very well. He asks about varicella vaccine- spoke with him that live vaccines are typically contraindicated in HIV+ but there have been studies in pt's with CD4 > 500 and it has been safe. He would like vaccine (will ask if he can get at Dr Cato Mulligan office). Also needs PNVX booster. Offered/refused condoms. Will see him back in 6 months with labs prior.

## 2012-02-16 NOTE — Progress Notes (Signed)
  Subjective:    Patient ID: [name], male    DOB: Nov 09, 1950, 61 y.o.   MRN: [id number]  HPI 61 yo M with hx of hemochromatosis, DM2 (on border 59 yrs, on metformin 59yr), hyperlipidemia, lumbar laminectomy/decompression (04-2011), and HIV+ since 1990 when he was dx on insurance physical. He has been maintained on atripla (his only rx).  Has finished PT, working on a walking regimen.  HIV 1 RNA Quant (copies/mL)  Date Value  02/02/2012 <20   01/22/2011 NOT DETECTED   06/21/2009 <48 copies/mL      CD4 T Cell Abs (cmm)  Date Value  02/02/2012 580   01/22/2011 510   06/21/2009 210*    No problems with ART. A1C continues to be elevated (8 at recent renal eval). Has f/u with Dr Cato Mulligan this month.   Review of Systems  Constitutional: Negative for appetite change and unexpected weight change.       Doesn't check home FSG. Believes numbness is related to previous back surgery. Had ophtho in June  Gastrointestinal: Negative for diarrhea and constipation.  Genitourinary: Negative for difficulty urinating.  Neurological: Positive for numbness.       Objective:   Physical Exam  Constitutional: He appears well-developed and well-nourished.  HENT:  Mouth/Throat: No oropharyngeal exudate.  Eyes: EOM are normal. Pupils are equal, round, and reactive to light. No scleral icterus.  Neck: Neck supple.  Cardiovascular: Normal rate, regular rhythm and normal heart sounds.   Pulmonary/Chest: Effort normal and breath sounds normal.  Abdominal: Soft. Bowel sounds are normal. There is no tenderness. There is no rebound.  Lymphadenopathy:    He has no cervical adenopathy.          Assessment & Plan:

## 2012-02-24 ENCOUNTER — Other Ambulatory Visit: Payer: Self-pay | Admitting: Infectious Diseases

## 2012-03-25 ENCOUNTER — Other Ambulatory Visit: Payer: Self-pay | Admitting: Infectious Diseases

## 2012-03-29 ENCOUNTER — Other Ambulatory Visit: Payer: Self-pay | Admitting: Internal Medicine

## 2012-03-29 ENCOUNTER — Other Ambulatory Visit: Payer: Self-pay | Admitting: *Deleted

## 2012-04-06 ENCOUNTER — Other Ambulatory Visit: Payer: Self-pay | Admitting: Internal Medicine

## 2012-04-06 MED ORDER — METFORMIN HCL 500 MG PO TABS: 500.0000 mg | ORAL_TABLET | Freq: Every day | ORAL | Status: DC

## 2012-04-06 NOTE — Telephone Encounter (Signed)
Pt needs refill on metformin 500 mg twice a day #180 sent to [facility]. Pt has appt on 06-02-2012 8am

## 2012-04-06 NOTE — Telephone Encounter (Signed)
Pt has appt in March ok per Dr Cato Mulligan to give enough till appt, rx sent in electronically

## 2012-04-28 ENCOUNTER — Ambulatory Visit: Payer: 59 | Admitting: Endocrinology

## 2012-05-04 ENCOUNTER — Ambulatory Visit: Payer: 59 | Admitting: Endocrinology

## 2012-05-05 ENCOUNTER — Encounter: Payer: Self-pay | Admitting: Endocrinology

## 2012-05-05 ENCOUNTER — Ambulatory Visit (INDEPENDENT_AMBULATORY_CARE_PROVIDER_SITE_OTHER): Payer: 59 | Admitting: Endocrinology

## 2012-05-05 VITALS — BP 128/74 | HR 90 | Wt 215.0 lb

## 2012-05-05 DIAGNOSIS — IMO0001 Reserved for inherently not codable concepts without codable children: Secondary | ICD-10-CM

## 2012-05-05 MED ORDER — BROMOCRIPTINE MESYLATE 2.5 MG PO TABS: 2.5000 mg | ORAL_TABLET | Freq: Every day | ORAL | Status: DC

## 2012-05-05 MED ORDER — SITAGLIPTIN PHOSPHATE 100 MG PO TABS: 100.0000 mg | ORAL_TABLET | Freq: Every day | ORAL | Status: DC

## 2012-05-05 MED ORDER — METFORMIN HCL ER 500 MG PO TB24: ORAL_TABLET | ORAL | Status: DC

## 2012-05-05 NOTE — Progress Notes (Signed)
Subjective:    Patient ID: [name], male    DOB: 05-02-1950, 62 y.o.   MRN: [id number]  HPI pt states 7 years h/o dm.  he is unaware of any chronic complications.  he has never been on insulin.  pt says his diet is good, and exercise has been limited by back problems.   Pt states 18 months of slight numbness of the toes, possibly in the context of back problems, and assoc fatigue.  Past Medical History  Diagnosis Date  . HIV disease 1990    DR. HATCHER WITH [facility]  . Hypertension   . HEMOCHROMATOSIS, HX OF 12/15/2005    phlebotomy every 6 to 8 weeks--at [facility]  . GOUT 06/22/2006  . GILBERT'S SYNDROME 02/25/2007  . DIABETES MELLITUS, TYPE II, UNCONTROLLED 06/22/2006  . Diabetes mellitus     on oral meds-no insulin  . HNP (herniated nucleus pulposus)     LUMBAR PAIN WITH PAIN DOWN LEFT LEG TO TOES-SOME NUMBNESS AND TINGLING IN BIG TOE  . Complication of anesthesia     ANESTHESIA AWARENESS  DURING ADRENALECTOMY AND COLONOSCOPY    Past Surgical History  Procedure Laterality Date  . Cholecystectomy    . Adrenalectomy      RIGHT ADRENALECTOMY   . Orif left shoulder      STILL HAS HARDWARE IN THE SHOULDER  . Lumbar laminectomy/decompression microdiscectomy  04/23/2011    Procedure: LUMBAR LAMINECTOMY/DECOMPRESSION MICRODISCECTOMY;  Surgeon: Javier Docker, MD;  Location: [facility];  Service: Orthopedics;  Laterality: N/A;  Decompression L5 S1 (X-Ray)    History   Social History  . Marital Status: Single    Spouse Name: N/A    Number of Children: N/A  . Years of Education: N/A   Occupational History  . Not on file.   Social History Main Topics  . Smoking status: Never Smoker   . Smokeless tobacco: Never Used  . Alcohol Use: No  . Drug Use: No  . Sexually Active: No     Comment: pt. declined condoms   Other Topics Concern  . Not on file   Social History Narrative  . No narrative on file    Current Outpatient  Prescriptions on File Prior to Visit  Medication Sig Dispense Refill  . allopurinol (ZYLOPRIM) 300 MG tablet Take 300 mg by mouth daily.       Marland Kitchen AMLODIPINE BESYLATE PO Take 10 mg by mouth daily before breakfast.       . ATRIPLA 600-200-300 MG per tablet Take 1 tablet by mouth  daily at bedtime  30 each  11  . carvedilol (COREG) 25 MG tablet Take 25 mg by mouth 2 (two) times daily with a meal.       . colchicine 0.6 MG tablet Take 1.2 mg by mouth daily.      Marland Kitchen gabapentin (NEURONTIN) 300 MG capsule 300 mg Twice daily.      Marland Kitchen glucose blood (ACCU-CHEK AVIVA) test strip 1 each by Other route as needed. Use as instructed       . Lancets (ACCU-CHEK MULTICLIX) lancets 1 each by Other route as needed. Use as instructed       . meloxicam (MOBIC) 15 MG tablet Take 7.5 mg by mouth daily.       . metFORMIN (GLUCOPHAGE) 500 MG tablet Take 1 tablet (500 mg total) by mouth daily with breakfast.  180 tablet  0  . telmisartan (MICARDIS) 80 MG tablet Take  80 mg by mouth daily before breakfast.        No current facility-administered medications on file prior to visit.    Allergies  Allergen Reactions  . Codeine Itching  . Meperidine Hcl Itching  . Scopolamine     GIVEN WITH A SURGERY YRS AGO--CAUSED HALLUNCINATIONS    Family History  Problem Relation Age of Onset  . Hypertension Mother   . Hyperlipidemia Mother   . Atrial fibrillation Mother   . Hyperlipidemia Father   . Hypertension Father   . Atrial fibrillation Father   DM: none  BP 128/74  Pulse 90  Wt 215 lb (97.523 kg)  BMI 31.74 kg/m2  SpO2 98%  Review of Systems denies weight loss, blurry vision, headache, chest pain, sob, n/v, urinary frequency, cramps, excessive diaphoresis, memory loss, hypoglycemia, rhinorrhea, and easy bruising.  He reports insomnia.    Objective:   Physical Exam VS: see vs page GEN: no distress HEAD: head: no deformity eyes: no periorbital swelling, no proptosis external nose and ears are normal.    mouth: no lesion seen NECK: supple, thyroid is not enlarged.   CHEST WALL: no deformity.  LUNGS: clear to auscultation. BREASTS:  No gynecomastia. CV: reg rate and rhythm, no murmur. ABD: abdomen is soft, nontender.  no hepatosplenomegaly.  not distended.  no hernia.  There is abdominal visceral fat, but no obvious dystrophic fat.   MUSCULOSKELETAL: muscle bulk and strength are grossly normal.  no obvious joint swelling.  gait is normal and steady EXTEMITIES: no deformity.  no ulcer on the feet.  feet are of normal color and temp.  1+ bilat leg edema PULSES: dorsalis pedis intact bilat.  no carotid bruit NEURO:  cn 2-12 grossly intact.   readily moves all 4's.  sensation is intact to touch on the feet, but decreased from normal. SKIN:  Normal texture and temperature.  No rash or suspicious lesion is visible.   NODES:  None palpable at the neck.  PSYCH: alert, oriented x3.  Does not appear anxious nor depressed.   (pt reports last a1c was 8 at nephrology, 2 mos ago)    Assessment & Plan:  DM: needs increased rx Numbness.  Unlikely this is due to DM at this a1c, but it is possible Low-back pain.  This limits exercise rx of DM

## 2012-05-05 NOTE — Patient Instructions (Addendum)
good diet and exercise habits significanly improve the control of your diabetes.  please let me know if you wish to be referred to a dietician.  high blood sugar is very risky to your health.  you should see an eye doctor every year.  You are at higher than average risk for pneumonia and hepatitis-B.  You should be vaccinated against both.   controlling your blood pressure and cholesterol drastically reduces the damage diabetes does to your body.  this also applies to quitting smoking.  please discuss these with your doctor.  you should take an aspirin every day, unless you have been advised by a doctor not to. check your blood sugar once a day.  vary the time of day when you check, between before the 3 meals, and at bedtime.  also check if you have symptoms of your blood sugar being too high or too low.  please keep a record of the readings and bring it to your next appointment here.  please call us sooner if your blood sugar goes below 70, or if you have a lot of readings over 200.  Some specialists feel that taking folic acid helps the neuropathy.   i have sent 2 prescriptions to your pharmacy: to add "januvia," and to increase the metformin. Please come back for a follow-up appointment in 3 months.

## 2012-05-20 ENCOUNTER — Encounter: Payer: Self-pay | Admitting: Endocrinology

## 2012-06-02 ENCOUNTER — Ambulatory Visit: Payer: 59 | Admitting: Internal Medicine

## 2012-08-16 ENCOUNTER — Other Ambulatory Visit: Payer: 59

## 2012-08-27 ENCOUNTER — Other Ambulatory Visit: Payer: Self-pay | Admitting: *Deleted

## 2012-08-27 DIAGNOSIS — B20 Human immunodeficiency virus [HIV] disease: Secondary | ICD-10-CM

## 2012-08-27 MED ORDER — EFAVIRENZ-EMTRICITAB-TENOFOVIR 600-200-300 MG PO TABS: 1.0000 | ORAL_TABLET | Freq: Every day | ORAL | Status: DC

## 2012-08-30 ENCOUNTER — Other Ambulatory Visit: Payer: 59

## 2012-08-30 ENCOUNTER — Ambulatory Visit: Payer: 59 | Admitting: Infectious Diseases

## 2012-09-07 ENCOUNTER — Other Ambulatory Visit (INDEPENDENT_AMBULATORY_CARE_PROVIDER_SITE_OTHER): Payer: PRIVATE HEALTH INSURANCE

## 2012-09-07 DIAGNOSIS — E785 Hyperlipidemia, unspecified: Secondary | ICD-10-CM

## 2012-09-07 DIAGNOSIS — Z79899 Other long term (current) drug therapy: Secondary | ICD-10-CM

## 2012-09-07 DIAGNOSIS — Z113 Encounter for screening for infections with a predominantly sexual mode of transmission: Secondary | ICD-10-CM

## 2012-09-07 DIAGNOSIS — E1110 Type 2 diabetes mellitus with ketoacidosis without coma: Secondary | ICD-10-CM

## 2012-09-07 DIAGNOSIS — B20 Human immunodeficiency virus [HIV] disease: Secondary | ICD-10-CM

## 2012-09-07 LAB — COMPREHENSIVE METABOLIC PANEL
ALT: 41 U/L (ref 0–53)
AST: 53 U/L — ABNORMAL HIGH (ref 0–37)
Albumin: 4.5 g/dL (ref 3.5–5.2)
Alkaline Phosphatase: 102 U/L (ref 39–117)
BUN: 11 mg/dL (ref 6–23)
CO2: 24 mEq/L (ref 19–32)
Calcium: 9.3 mg/dL (ref 8.4–10.5)
Chloride: 106 mEq/L (ref 96–112)
Creat: 0.86 mg/dL (ref 0.50–1.35)
Glucose, Bld: 109 mg/dL — ABNORMAL HIGH (ref 70–99)
Potassium: 4.1 mEq/L (ref 3.5–5.3)
Sodium: 137 mEq/L (ref 135–145)
Total Bilirubin: 0.7 mg/dL (ref 0.3–1.2)
Total Protein: 7.3 g/dL (ref 6.0–8.3)

## 2012-09-07 LAB — CBC
HCT: 37 % — ABNORMAL LOW (ref 39.0–52.0)
Hemoglobin: 13.5 g/dL (ref 13.0–17.0)
MCH: 34.8 pg — ABNORMAL HIGH (ref 26.0–34.0)
MCHC: 36.5 g/dL — ABNORMAL HIGH (ref 30.0–36.0)
MCV: 95.4 fL (ref 78.0–100.0)
Platelets: 179 10*3/uL (ref 150–400)
RBC: 3.88 MIL/uL — ABNORMAL LOW (ref 4.22–5.81)
RDW: 14 % (ref 11.5–15.5)
WBC: 7.6 10*3/uL (ref 4.0–10.5)

## 2012-09-07 LAB — LIPID PANEL
Cholesterol: 149 mg/dL (ref 0–200)
HDL: 34 mg/dL — ABNORMAL LOW (ref >39)
Total CHOL/HDL Ratio: 4.4 Ratio
Triglycerides: 549 mg/dL — ABNORMAL HIGH (ref <150)

## 2012-09-08 LAB — HIV-1 RNA QUANT-NO REFLEX-BLD
HIV 1 RNA Quant: <20 copies/mL (ref <20)
HIV-1 RNA Quant, Log: <1.3 {Log} (ref <1.30)

## 2012-09-08 LAB — T-HELPER CELL (CD4) - (RCID CLINIC ONLY)
CD4 % Helper T Cell: 21 % — ABNORMAL LOW (ref 33–55)
CD4 T Cell Abs: 680 uL (ref 400–2700)

## 2012-09-08 LAB — HEMOGLOBIN A1C
Hgb A1c MFr Bld: 6.1 % — ABNORMAL HIGH (ref <5.7)
Mean Plasma Glucose: 128 mg/dL — ABNORMAL HIGH (ref <117)

## 2012-09-08 LAB — RPR

## 2012-09-13 ENCOUNTER — Ambulatory Visit: Payer: 59 | Admitting: Infectious Diseases

## 2012-10-06 ENCOUNTER — Encounter: Payer: Self-pay | Admitting: Infectious Diseases

## 2012-10-06 ENCOUNTER — Ambulatory Visit (INDEPENDENT_AMBULATORY_CARE_PROVIDER_SITE_OTHER): Payer: PRIVATE HEALTH INSURANCE | Admitting: Infectious Diseases

## 2012-10-06 VITALS — BP 130/84 | HR 96 | Temp 98.5°F | Ht 68.0 in | Wt 220.0 lb

## 2012-10-06 DIAGNOSIS — B20 Human immunodeficiency virus [HIV] disease: Secondary | ICD-10-CM

## 2012-10-06 DIAGNOSIS — Z113 Encounter for screening for infections with a predominantly sexual mode of transmission: Secondary | ICD-10-CM

## 2012-10-06 DIAGNOSIS — IMO0001 Reserved for inherently not codable concepts without codable children: Secondary | ICD-10-CM

## 2012-10-06 DIAGNOSIS — E785 Hyperlipidemia, unspecified: Secondary | ICD-10-CM

## 2012-10-06 NOTE — Assessment & Plan Note (Signed)
He is doing well. Greatly appreciate partnering with his PCP.

## 2012-10-06 NOTE — Assessment & Plan Note (Signed)
He is doing very well. I offered to change him to a newer ART that would not give him problems with dreams. He wishes to defer for 6 months. He is hesitant as his CD4 is excellent and his HIV RNA is undetectable. Offered. Refused condoms. Will get flu and zoster vax at PCP. Will see him back in 6 months.

## 2012-10-06 NOTE — Assessment & Plan Note (Signed)
Much improved but still quite high trig.

## 2012-10-06 NOTE — Progress Notes (Signed)
Subjective:    Patient ID: [name], male    DOB: [birth year], 62 y.o.   MRN: [id number]  HPI 62 yo M with hx of hemochromatosis, DM2 (on border 10-15 yrs, on metformin 61yr), hyperlipidemia, lumbar laminectomy/decompression (04-2011), and HIV+ since 1990 when he was dx on insurance physical. He has been maintained on atripla (his only rx).  Having problems with abn dreams- has had for many years.  DM has been good, last A1C is 6.1. Has not had ophtho yet. Had colonoscopy ~2 yrs ago December.   HIV 1 RNA Quant (copies/mL)  Date Value  09/07/2012 <20   02/02/2012 <20   01/22/2011 NOT DETECTED      CD4 T Cell Abs (cmm)  Date Value  09/07/2012 680   02/02/2012 580   01/22/2011 510    Lab Results  Component Value Date   CHOL 149 09/07/2012   HDL 34* 09/07/2012   LDLCALC Comment:   Not calculated due to Triglyceride >400. Suggest ordering Direct LDL (Unit Code: 65784).   Total Cholesterol/HDL Ratio:CHD Risk                        Coronary Heart Disease Risk Table                                        Men       Women          1/2 Average Risk              3.4        3.3              Average Risk              5.0        4.4           2X Average Risk              9.6        7.1           3X Average Risk             23.4       11.0 Use the calculated Patient Ratio above and the CHD Risk table  to determine the patient's CHD Risk. ATP III Classification (LDL):       < 100        mg/dL         Optimal      696 - 129     mg/dL         Near or Above Optimal      130 - 159     mg/dL         Borderline High      160 - 189     mg/dL         High       > 295        mg/dL         Very High   04/17/4130   LDLDIRECT 55.9 11/08/2010   TRIG 549* 09/07/2012   CHOLHDL 4.4 09/07/2012        Review of Systems  Constitutional: Negative for appetite change and unexpected weight change.  Gastrointestinal: Negative for diarrhea and constipation.  Genitourinary: Negative for difficulty urinating.  Neurological:  Positive for numbness.  Objective:   Physical Exam  Constitutional: He appears well-developed and well-nourished.  HENT:  Mouth/Throat: No oropharyngeal exudate.  Eyes: EOM are normal. Pupils are equal, round, and reactive to light.  Neck: Neck supple.  Cardiovascular: Normal rate, regular rhythm and normal heart sounds.   Pulmonary/Chest: Effort normal and breath sounds normal.  Abdominal: Soft. Bowel sounds are normal. There is no tenderness.  Musculoskeletal: He exhibits no edema.  Lymphadenopathy:    He has no cervical adenopathy.  Neurological:  Mild numbness in L toes.   Skin:  No diabetic foot lesions          Assessment & Plan:

## 2012-12-06 ENCOUNTER — Other Ambulatory Visit: Payer: Self-pay | Admitting: Infectious Diseases

## 2013-01-09 ENCOUNTER — Other Ambulatory Visit: Payer: Self-pay | Admitting: Infectious Diseases

## 2013-01-13 ENCOUNTER — Other Ambulatory Visit: Payer: Self-pay

## 2013-02-06 ENCOUNTER — Other Ambulatory Visit: Payer: Self-pay | Admitting: Infectious Diseases

## 2013-02-16 ENCOUNTER — Ambulatory Visit (INDEPENDENT_AMBULATORY_CARE_PROVIDER_SITE_OTHER): Payer: 59 | Admitting: Podiatry

## 2013-02-16 ENCOUNTER — Encounter: Payer: Self-pay | Admitting: Podiatry

## 2013-02-16 VITALS — BP 121/67 | HR 99 | Ht 69.0 in | Wt 220.0 lb

## 2013-02-16 DIAGNOSIS — M21619 Bunion of unspecified foot: Secondary | ICD-10-CM

## 2013-02-16 DIAGNOSIS — M2010 Hallux valgus (acquired), unspecified foot: Secondary | ICD-10-CM

## 2013-02-16 DIAGNOSIS — B351 Tinea unguium: Secondary | ICD-10-CM

## 2013-02-16 DIAGNOSIS — M21969 Unspecified acquired deformity of unspecified lower leg: Secondary | ICD-10-CM

## 2013-02-16 MED ORDER — EFINACONAZOLE 10 % EX SOLN: 1.0000 [drp] | CUTANEOUS | Status: DC

## 2013-02-16 NOTE — Patient Instructions (Signed)
Seen for diabetic foot care. Findings reveal bunion deformity both feet, plantar callus left foot, and toe nail fungus on right great toe. May benefit from Orthotic shoe inserts. Rx. For Jublia sent.  Will prepare for orthotics on next visit.

## 2013-02-16 NOTE — Progress Notes (Signed)
Has had L5-S1 laminectomy a year ago. Since then the last 3 digits on both feet got numb.  HgA1c is 6.1. Diabetic x 3 years.  Review of Systems - General ROS: negative for - chills, fatigue, fever, night sweats, sleep disturbance, weight gain or weight loss Ophthalmic ROS: negative ENT ROS: negative Allergy and Immunology ROS: Some drug allergies, scopolamin, demerol, and morphine. Endocrine ROS: negative Respiratory ROS: no cough, shortness of breath, or wheezing Cardiovascular ROS: no chest pain or dyspnea on exertion Gastrointestinal ROS: no abdominal pain, change in bowel habits, or black or bloody stools Genito-Urinary ROS: no dysuria, trouble voiding, or hematuria Musculoskeletal ROS: negative Gout now and then and takes Colchicin as needed. Neurological ROS: no TIA or stroke symptoms except numbness on last 3,4,5 digits especially at night. Dermatological ROS: negative  Objective: Dermatologic Discolored right great toe nail. Orthopedic: Hallux limitus with loading of forefoot.  Hallux valgus with bunion bilateral. Plantar callus under the Tibial sesamoid on left foot.  Assessment: NIDDM under control. Hallux valgus with bunion bilateral. Plantar callus under 1st MPJ left. Hallux limitus with discoloration on right great toe nail. Onychomycosis right great toe.  Plan:  Discussed all clinical findings and available treatment options. Rx. Jublia for right great toe nail. Will benefit from Orthotics. Patient will return for orthotic shoe inserts.

## 2013-03-08 ENCOUNTER — Other Ambulatory Visit: Payer: Self-pay | Admitting: Infectious Diseases

## 2013-03-08 DIAGNOSIS — B20 Human immunodeficiency virus [HIV] disease: Secondary | ICD-10-CM

## 2013-03-28 ENCOUNTER — Other Ambulatory Visit: Payer: PRIVATE HEALTH INSURANCE

## 2013-04-11 ENCOUNTER — Ambulatory Visit: Payer: PRIVATE HEALTH INSURANCE | Admitting: Infectious Diseases

## 2013-04-13 ENCOUNTER — Other Ambulatory Visit: Payer: PRIVATE HEALTH INSURANCE

## 2013-04-13 DIAGNOSIS — E1159 Type 2 diabetes mellitus with other circulatory complications: Secondary | ICD-10-CM

## 2013-04-13 DIAGNOSIS — E785 Hyperlipidemia, unspecified: Secondary | ICD-10-CM

## 2013-04-13 DIAGNOSIS — B20 Human immunodeficiency virus [HIV] disease: Secondary | ICD-10-CM

## 2013-04-13 LAB — CBC WITH DIFFERENTIAL/PLATELET
Basophils Absolute: 0 10*3/uL (ref 0.0–0.1)
Basophils Relative: 0 % (ref 0–1)
Eosinophils Absolute: 0.2 10*3/uL (ref 0.0–0.7)
Eosinophils Relative: 2 % (ref 0–5)
HCT: 41.7 % (ref 39.0–52.0)
Hemoglobin: 15.3 g/dL (ref 13.0–17.0)
Lymphocytes Relative: 45 % (ref 12–46)
Lymphs Abs: 3.4 10*3/uL (ref 0.7–4.0)
MCH: 35.5 pg — ABNORMAL HIGH (ref 26.0–34.0)
MCHC: 36.7 g/dL — ABNORMAL HIGH (ref 30.0–36.0)
MCV: 96.8 fL (ref 78.0–100.0)
Monocytes Absolute: 0.5 10*3/uL (ref 0.1–1.0)
Monocytes Relative: 7 % (ref 3–12)
Neutro Abs: 3.5 10*3/uL (ref 1.7–7.7)
Neutrophils Relative %: 46 % (ref 43–77)
Platelets: 217 10*3/uL (ref 150–400)
RBC: 4.31 MIL/uL (ref 4.22–5.81)
RDW: 13.1 % (ref 11.5–15.5)
WBC: 7.6 10*3/uL (ref 4.0–10.5)

## 2013-04-14 LAB — LIPID PANEL
Cholesterol: 170 mg/dL (ref 0–200)
HDL: 38 mg/dL — ABNORMAL LOW (ref >39)
Total CHOL/HDL Ratio: 4.5 Ratio
Triglycerides: 736 mg/dL — ABNORMAL HIGH (ref <150)

## 2013-04-14 LAB — COMPLETE METABOLIC PANEL WITH GFR
ALT: 32 U/L (ref 0–53)
AST: 34 U/L (ref 0–37)
Albumin: 4.7 g/dL (ref 3.5–5.2)
Alkaline Phosphatase: 89 U/L (ref 39–117)
BUN: 17 mg/dL (ref 6–23)
CO2: 27 mEq/L (ref 19–32)
Calcium: 9.8 mg/dL (ref 8.4–10.5)
Chloride: 99 mEq/L (ref 96–112)
Creat: 1.21 mg/dL (ref 0.50–1.35)
GFR, Est African American: 74 mL/min
GFR, Est Non African American: 64 mL/min
Glucose, Bld: 151 mg/dL — ABNORMAL HIGH (ref 70–99)
Potassium: 5 mEq/L (ref 3.5–5.3)
Sodium: 138 mEq/L (ref 135–145)
Total Bilirubin: 0.9 mg/dL (ref 0.2–1.2)
Total Protein: 7.8 g/dL (ref 6.0–8.3)

## 2013-04-14 LAB — HEMOGLOBIN A1C
Hgb A1c MFr Bld: 6.1 % — ABNORMAL HIGH (ref <5.7)
Mean Plasma Glucose: 128 mg/dL — ABNORMAL HIGH (ref <117)

## 2013-04-14 LAB — T-HELPER CELL (CD4) - (RCID CLINIC ONLY)
CD4 % Helper T Cell: 21 % — ABNORMAL LOW (ref 33–55)
CD4 T Cell Abs: 710 /uL (ref 400–2700)

## 2013-04-15 LAB — HIV-1 RNA QUANT-NO REFLEX-BLD
HIV 1 RNA Quant: <20 copies/mL (ref <20)
HIV-1 RNA Quant, Log: <1.3 {Log} (ref <1.30)

## 2013-04-27 ENCOUNTER — Telehealth: Payer: Self-pay | Admitting: Endocrinology

## 2013-04-27 ENCOUNTER — Encounter: Payer: Self-pay | Admitting: Infectious Diseases

## 2013-04-27 ENCOUNTER — Ambulatory Visit (INDEPENDENT_AMBULATORY_CARE_PROVIDER_SITE_OTHER): Payer: PRIVATE HEALTH INSURANCE | Admitting: Infectious Diseases

## 2013-04-27 VITALS — BP 142/80 | HR 86 | Temp 98.3°F | Ht 69.0 in | Wt 223.0 lb

## 2013-04-27 DIAGNOSIS — Z862 Personal history of diseases of the blood and blood-forming organs and certain disorders involving the immune mechanism: Secondary | ICD-10-CM

## 2013-04-27 DIAGNOSIS — IMO0001 Reserved for inherently not codable concepts without codable children: Secondary | ICD-10-CM

## 2013-04-27 DIAGNOSIS — E1165 Type 2 diabetes mellitus with hyperglycemia: Secondary | ICD-10-CM

## 2013-04-27 DIAGNOSIS — B20 Human immunodeficiency virus [HIV] disease: Secondary | ICD-10-CM

## 2013-04-27 DIAGNOSIS — E785 Hyperlipidemia, unspecified: Secondary | ICD-10-CM

## 2013-04-27 NOTE — Progress Notes (Signed)
   Subjective:    Patient ID: [name], male    DOB: [birth year], 63 y.o.   MRN: [id number]  HPI 63 yo M with hx of hemochromatosis, DM2 (on border 10-15 yrs, on metformin 23yr), hyperlipidemia, lumbar laminectomy/decompression (04-2011), and HIV+ since 1990 when he was dx on insurance physical. He has been maintained on atripla (his only rx).  Having problems with abn dreams- has had for many years. No problems with atripla.  DM has been good, last A1C is 6.1. Has been watching his diet. Has been exercising- water aerobics, cardio. 2day/wk. Has LE numbness, L sided,  (onset his back surgery). Had no relief with neurontin.   Has ophtho scheduled for a couple of weeks. Got shingles vax.  Had colonoscopy ~2 yrs ago December.   HIV 1 RNA Quant (copies/mL)  Date Value  04/13/2013 <20   09/07/2012 <20   02/02/2012 <20      CD4 T Cell Abs (/uL)  Date Value  04/13/2013 710   09/07/2012 680   02/02/2012 580      Review of Systems  Constitutional: Negative for appetite change and unexpected weight change.  Respiratory: Negative for shortness of breath.   Cardiovascular: Negative for chest pain.  Gastrointestinal: Negative for diarrhea and constipation.  Genitourinary: Negative for difficulty urinating.  Neurological: Positive for numbness. Negative for headaches.       Objective:   Physical Exam  Constitutional: He appears well-developed and well-nourished.  HENT:  Mouth/Throat: No oropharyngeal exudate.  Eyes: EOM are normal. Pupils are equal, round, and reactive to light.  Neck: Neck supple.  Cardiovascular: Normal rate, regular rhythm and normal heart sounds.   Pulmonary/Chest: Effort normal and breath sounds normal.  Abdominal: Soft. Bowel sounds are normal. He exhibits no distension. There is no tenderness. There is no rebound.  Lymphadenopathy:    He has no cervical adenopathy.          Assessment & Plan:

## 2013-04-27 NOTE — Assessment & Plan Note (Signed)
Pt's GI MD is now out of network (Dr Thana Farr). I suggested he f/u with his PCP for referral to [facility].

## 2013-04-27 NOTE — Assessment & Plan Note (Signed)
He states he has not been on treatment for this prior. I would suggest starting him on fibrate, will defer to PCP/Endo. This will have minimal interaction with atripla, more significant with colchicine. He is watching diet and exercising.

## 2013-04-27 NOTE — Assessment & Plan Note (Signed)
My great appreciation to Dr Lauralee Evener for partnering with [place]. His A1C is well controlled.

## 2013-04-27 NOTE — Assessment & Plan Note (Signed)
He's doing very well. His vax are up to date. He is offered/refuses condoms. Will see him back in 6 months with labs prior.

## 2013-04-27 NOTE — Telephone Encounter (Signed)
please call patient: Ov is due 

## 2013-04-28 NOTE — Telephone Encounter (Signed)
Pt called and scheduled to come in on 05/11/2012.

## 2013-05-11 ENCOUNTER — Ambulatory Visit (INDEPENDENT_AMBULATORY_CARE_PROVIDER_SITE_OTHER): Payer: PRIVATE HEALTH INSURANCE | Admitting: Endocrinology

## 2013-05-11 ENCOUNTER — Encounter: Payer: Self-pay | Admitting: Endocrinology

## 2013-05-11 VITALS — BP 122/76 | HR 89 | Temp 98.6°F | Ht 69.0 in | Wt 225.0 lb

## 2013-05-11 DIAGNOSIS — R5383 Other fatigue: Secondary | ICD-10-CM

## 2013-05-11 DIAGNOSIS — R5381 Other malaise: Secondary | ICD-10-CM

## 2013-05-11 DIAGNOSIS — E1165 Type 2 diabetes mellitus with hyperglycemia: Principal | ICD-10-CM

## 2013-05-11 DIAGNOSIS — IMO0001 Reserved for inherently not codable concepts without codable children: Secondary | ICD-10-CM

## 2013-05-11 NOTE — Patient Instructions (Addendum)
Please continue the same diabetes medications check your blood sugar once a day.  vary the time of day when you check, between before the 3 meals, and at bedtime.  also check if you have symptoms of your blood sugar being too high or too low.  please keep a record of the readings and bring it to your next appointment here.  You can write it on any piece of paper.  please call us sooner if your blood sugar goes below 70, or if you have a lot of readings over 200. Please return in 1 year.  blood tests are being requested for you today.  We'll contact you with results.

## 2013-05-11 NOTE — Progress Notes (Signed)
Subjective:    Patient ID: [name], male    DOB: 02-19-1951, 63 y.o.   MRN: [id number]  HPI pt returns for f/u of type 2 DM (dx'ed 2007; He has mild if any neuropathy of the lower extremities; he is unaware of any associated chronic complications; he has never been on insulin).  pt states he feels well in general.   Past Medical History  Diagnosis Date  . HIV disease 1990    [facility]  . Hypertension   . HEMOCHROMATOSIS, HX OF 12/15/2005    phlebotomy every 6 to 8 weeks--[facility]  . GOUT 06/22/2006  . GILBERT'S SYNDROME 02/25/2007  . DIABETES MELLITUS, TYPE II, UNCONTROLLED 06/22/2006  . Diabetes mellitus     on oral meds-no insulin  . HNP (herniated nucleus pulposus)     LUMBAR PAIN WITH PAIN DOWN LEFT LEG TO TOES-SOME NUMBNESS AND TINGLING IN BIG TOE  . Complication of anesthesia     ANESTHESIA AWARENESS  DURING ADRENALECTOMY AND COLONOSCOPY    Past Surgical History  Procedure Laterality Date  . Cholecystectomy    . Adrenalectomy      RIGHT ADRENALECTOMY   . Orif left shoulder      STILL HAS HARDWARE IN THE SHOULDER  . Lumbar laminectomy/decompression microdiscectomy  04/23/2011    Procedure: LUMBAR LAMINECTOMY/DECOMPRESSION MICRODISCECTOMY;  Surgeon: Johnn Hai, MD;  Location: [facility];  Service: Orthopedics;  Laterality: N/A;  Decompression L5 S1 (X-Ray)    History   Social History  . Marital Status: Single    Spouse Name: N/A    Number of Children: N/A  . Years of Education: N/A   Occupational History  . Not on file.   Social History Main Topics  . Smoking status: Never Smoker   . Smokeless tobacco: Never Used  . Alcohol Use: No  . Drug Use: No  . Sexual Activity: No     Comment: pt. declined condoms   Other Topics Concern  . Not on file   Social History Narrative  . No narrative on file    Current Outpatient Prescriptions on File Prior to Visit  Medication Sig Dispense Refill  .  allopurinol (ZYLOPRIM) 300 MG tablet Take 300 mg by mouth daily.       Marland Kitchen AMLODIPINE BESYLATE PO Take 10 mg by mouth daily before breakfast.       . ATRIPLA 600-200-300 MG per tablet TAKE 1 TABLET BY MOUTH EVERY NIGHT AT BEDTIME  30 tablet  3  . carvedilol (COREG) 25 MG tablet Take 25 mg by mouth daily.       . colchicine 0.6 MG tablet Take 0.6 mg by mouth as needed.       Marland Kitchen glucose blood (ACCU-CHEK AVIVA) test strip 1 each by Other route as needed. Use as instructed       . Lancets (ACCU-CHEK MULTICLIX) lancets 1 each by Other route as needed. Use as instructed       . metFORMIN (GLUCOPHAGE-XR) 500 MG 24 hr tablet Take 1,000 mg by mouth 2 (two) times daily. 4 tabs daily      . sitaGLIPtin (JANUVIA) 100 MG tablet Take 1 tablet (100 mg total) by mouth daily. Please cancel rx just sent for bromocriptine  90 tablet  3  . telmisartan (MICARDIS) 80 MG tablet Take 80 mg by mouth daily before breakfast.       . vitamin B-12 (CYANOCOBALAMIN) 1000 MCG tablet Take 1,000  mcg by mouth daily.       No current facility-administered medications on file prior to visit.    Allergies  Allergen Reactions  . Codeine Itching  . Meperidine Hcl Itching  . Scopolamine     GIVEN WITH A SURGERY YRS AGO--CAUSED HALLUNCINATIONS    Family History  Problem Relation Age of Onset  . Hypertension Mother   . Hyperlipidemia Mother   . Atrial fibrillation Mother   . Hyperlipidemia Father   . Hypertension Father   . Atrial fibrillation Father     BP 122/76  Pulse 89  Temp(Src) 98.6 F (37 C) (Oral)  Ht 5\' 9"  (1.753 m)  Wt 225 lb (102.059 kg)  BMI 33.21 kg/m2  SpO2 96%    Review of Systems Denies weight change.  He has fatigue.      Objective:   Physical Exam VITAL SIGNS:  See vs page GENERAL: no distress   Lab Results  Component Value Date   HGBA1C 6.1* 04/13/2013      Assessment & Plan:  Type 2 DM: well-controlled. Edema: due to norvasc. HIV: he is at risk for dystrophic fat.  If this happens,  we could change his [place] to pioglitizone.  If we did that, he would need to find an alternative for norvasc.

## 2013-05-12 LAB — TESTOSTERONE: Testosterone: 123.93 ng/dL — ABNORMAL LOW (ref 350.00–890.00)

## 2013-05-12 LAB — TSH: TSH: 0.46 u[IU]/mL (ref 0.35–5.50)

## 2013-05-13 ENCOUNTER — Other Ambulatory Visit: Payer: Self-pay | Admitting: Endocrinology

## 2013-05-13 ENCOUNTER — Other Ambulatory Visit (INDEPENDENT_AMBULATORY_CARE_PROVIDER_SITE_OTHER): Payer: PRIVATE HEALTH INSURANCE

## 2013-05-13 DIAGNOSIS — E291 Testicular hypofunction: Secondary | ICD-10-CM | POA: Insufficient documentation

## 2013-05-13 LAB — LUTEINIZING HORMONE: LH: 5.98 m[IU]/mL (ref 1.50–9.30)

## 2013-05-14 ENCOUNTER — Other Ambulatory Visit: Payer: Self-pay | Admitting: Endocrinology

## 2013-05-14 DIAGNOSIS — E230 Hypopituitarism: Secondary | ICD-10-CM | POA: Insufficient documentation

## 2013-05-17 ENCOUNTER — Telehealth: Payer: Self-pay

## 2013-05-17 NOTE — Telephone Encounter (Signed)
[facility] called stating that the order placed for the MRI needed office note documentation to support the dx pituitary insufficiency for insurance to cover.  Please advise, Thanks!

## 2013-05-17 NOTE — Telephone Encounter (Signed)
please call patient: Before ins will cover the MRI, they need another ov, as the last one was for DM.

## 2013-05-17 NOTE — Telephone Encounter (Signed)
Called pt and advised OV is needed. Pt scheduled visit on 05/24/2013. Also, lab called about prolactin order was unable to be resulted due to specimen not in correct vial.  Pt advised that if labs needed to be drawn again he could have it done at next ov.

## 2013-05-24 ENCOUNTER — Encounter: Payer: Self-pay | Admitting: Endocrinology

## 2013-05-24 ENCOUNTER — Ambulatory Visit (INDEPENDENT_AMBULATORY_CARE_PROVIDER_SITE_OTHER): Payer: PRIVATE HEALTH INSURANCE | Admitting: Endocrinology

## 2013-05-24 VITALS — BP 122/74 | HR 90 | Temp 98.4°F | Ht 69.0 in | Wt 223.0 lb

## 2013-05-24 DIAGNOSIS — Z79899 Other long term (current) drug therapy: Secondary | ICD-10-CM

## 2013-05-24 DIAGNOSIS — E291 Testicular hypofunction: Secondary | ICD-10-CM

## 2013-05-24 NOTE — Patient Instructions (Addendum)
i have requested the MRI for you. blood tests are being requested for you today.  We'll contact you with results.

## 2013-05-24 NOTE — Progress Notes (Signed)
Subjective:    Patient ID: [name], male    DOB: 1951/01/28, 63 y.o.   MRN: [id number]  HPI pt returns for f/u of moderate secondary hypogonadism (dx'ed early 2015).  He reports mild arthralgias, throughout the body, and assoc fatigue. Past Medical History  Diagnosis Date  . HIV disease 1990    [facility]  . Hypertension   . HEMOCHROMATOSIS, HX OF 12/15/2005    phlebotomy every 6 to 8 weeks--at [facility]  . GOUT 06/22/2006  . GILBERT'S SYNDROME 02/25/2007  . DIABETES MELLITUS, TYPE II, UNCONTROLLED 06/22/2006  . Diabetes mellitus     on oral meds-no insulin  . HNP (herniated nucleus pulposus)     LUMBAR PAIN WITH PAIN DOWN LEFT LEG TO TOES-SOME NUMBNESS AND TINGLING IN BIG TOE  . Complication of anesthesia     ANESTHESIA AWARENESS  DURING ADRENALECTOMY AND COLONOSCOPY    Past Surgical History  Procedure Laterality Date  . Cholecystectomy    . Adrenalectomy      RIGHT ADRENALECTOMY   . Orif left shoulder      STILL HAS HARDWARE IN THE SHOULDER  . Lumbar laminectomy/decompression microdiscectomy  04/23/2011    Procedure: LUMBAR LAMINECTOMY/DECOMPRESSION MICRODISCECTOMY;  Surgeon: Johnn Hai, MD;  Location: [facility];  Service: Orthopedics;  Laterality: N/A;  Decompression L5 S1 (X-Ray)    History   Social History  . Marital Status: Single    Spouse Name: N/A    Number of Children: N/A  . Years of Education: N/A   Occupational History  . Not on file.   Social History Main Topics  . Smoking status: Never Smoker   . Smokeless tobacco: Never Used  . Alcohol Use: No  . Drug Use: No  . Sexual Activity: No     Comment: pt. declined condoms   Other Topics Concern  . Not on file   Social History Narrative  . No narrative on file    Current Outpatient Prescriptions on File Prior to Visit  Medication Sig Dispense Refill  . allopurinol (ZYLOPRIM) 300 MG tablet Take 300 mg by mouth daily.       Marland Kitchen  AMLODIPINE BESYLATE PO Take 10 mg by mouth daily before breakfast.       . ATRIPLA 600-200-300 MG per tablet TAKE 1 TABLET BY MOUTH EVERY NIGHT AT BEDTIME  30 tablet  3  . carvedilol (COREG) 25 MG tablet Take 25 mg by mouth daily.       . colchicine 0.6 MG tablet Take 0.6 mg by mouth as needed.       Marland Kitchen glucose blood (ACCU-CHEK AVIVA) test strip 1 each by Other route as needed. Use as instructed       . Lancets (ACCU-CHEK MULTICLIX) lancets 1 each by Other route as needed. Use as instructed       . metFORMIN (GLUCOPHAGE-XR) 500 MG 24 hr tablet Take 1,000 mg by mouth 2 (two) times daily. 4 tabs daily      . sitaGLIPtin (JANUVIA) 100 MG tablet Take 1 tablet (100 mg total) by mouth daily. Please cancel rx just sent for bromocriptine  90 tablet  3  . telmisartan (MICARDIS) 80 MG tablet Take 80 mg by mouth daily before breakfast.       . vitamin B-12 (CYANOCOBALAMIN) 1000 MCG tablet Take 1,000 mcg by mouth daily.       No current facility-administered medications on file prior to visit.  Allergies  Allergen Reactions  . Codeine Itching  . Meperidine Hcl Itching  . Scopolamine     GIVEN WITH A SURGERY YRS AGO--CAUSED HALLUNCINATIONS    Family History  Problem Relation Age of Onset  . Hypertension Mother   . Hyperlipidemia Mother   . Atrial fibrillation Mother   . Hyperlipidemia Father   . Hypertension Father   . Atrial fibrillation Father     BP 122/74  Pulse 90  Temp(Src) 98.4 F (36.9 C) (Oral)  Ht 5\' 9"  (1.753 m)  Wt 223 lb (101.152 kg)  BMI 32.92 kg/m2  SpO2 98%  Review of Systems denies weight change, headache, fever, diarrhea, rash, visual loss, abdominal pain, sob, depression, urinary frequency, arthralgias, gynecomastia, cramps, excessive diaphoresis, ED sxs, n/v, rhinorrhea, easy bruising, and numbness.  He has slight muscle weakness.     Objective:   Physical Exam VITAL SIGNS:  See vs page GENERAL: no distress GENITALIA:  Normal male testicles, scrotum, and  penis   Lab Results  Component Value Date   TESTOSTERONE 123.93* 05/11/2013      Assessment & Plan:  Hypogonadism, uncertain etiology. Muscle weakness: uncertain if this is related to hypogonadism.

## 2013-05-25 LAB — BASIC METABOLIC PANEL
BUN: 13 mg/dL (ref 6–23)
CO2: 21 mEq/L (ref 19–32)
Calcium: 9.9 mg/dL (ref 8.4–10.5)
Chloride: 105 mEq/L (ref 96–112)
Creat: 0.96 mg/dL (ref 0.50–1.35)
Glucose, Bld: 111 mg/dL — ABNORMAL HIGH (ref 70–99)
Potassium: 4.7 mEq/L (ref 3.5–5.3)
Sodium: 138 mEq/L (ref 135–145)

## 2013-05-25 LAB — PROLACTIN: Prolactin: 4.2 ng/mL (ref 2.1–17.1)

## 2013-05-30 ENCOUNTER — Other Ambulatory Visit: Payer: Self-pay | Admitting: Endocrinology

## 2013-06-07 ENCOUNTER — Ambulatory Visit
Admission: RE | Admit: 2013-06-07 | Discharge: 2013-06-07 | Disposition: A | Discharge status: Home / Self Care | Payer: PRIVATE HEALTH INSURANCE | Source: Ambulatory Visit | Attending: Endocrinology | Admitting: Endocrinology

## 2013-06-07 DIAGNOSIS — E291 Testicular hypofunction: Secondary | ICD-10-CM

## 2013-06-07 MED ORDER — GADOBENATE DIMEGLUMINE 529 MG/ML IV SOLN
10.0000 mL | Freq: Once | INTRAVENOUS | Status: AC | PRN
  Administered 2013-06-07: 10 mL via INTRAVENOUS

## 2013-06-08 ENCOUNTER — Other Ambulatory Visit: Payer: Self-pay | Admitting: Endocrinology

## 2013-06-08 MED ORDER — CLOMIPHENE CITRATE 50 MG PO TABS: ORAL_TABLET | ORAL | Status: DC

## 2013-07-13 ENCOUNTER — Other Ambulatory Visit: Payer: Self-pay | Admitting: Endocrinology

## 2013-07-15 ENCOUNTER — Ambulatory Visit: Payer: PRIVATE HEALTH INSURANCE | Admitting: Endocrinology

## 2013-07-17 ENCOUNTER — Other Ambulatory Visit: Payer: Self-pay | Admitting: Infectious Diseases

## 2013-07-22 ENCOUNTER — Ambulatory Visit (INDEPENDENT_AMBULATORY_CARE_PROVIDER_SITE_OTHER): Payer: PRIVATE HEALTH INSURANCE | Admitting: Endocrinology

## 2013-07-22 ENCOUNTER — Encounter: Payer: Self-pay | Admitting: Endocrinology

## 2013-07-22 VITALS — BP 122/60 | HR 96 | Temp 98.4°F | Ht 69.0 in | Wt 221.0 lb

## 2013-07-22 DIAGNOSIS — IMO0001 Reserved for inherently not codable concepts without codable children: Secondary | ICD-10-CM

## 2013-07-22 DIAGNOSIS — E1165 Type 2 diabetes mellitus with hyperglycemia: Secondary | ICD-10-CM

## 2013-07-22 DIAGNOSIS — E291 Testicular hypofunction: Secondary | ICD-10-CM

## 2013-07-22 NOTE — Progress Notes (Signed)
Subjective:    Patient ID: [name], male    DOB: 1950-06-22, 63 y.o.   MRN: [id number]  HPI pt returns for f/u of idiopathic secondary hypogonadism (dx'ed early 2015; pituitary MRI was normal; he does not want fertility; he has not yet had testing for other pituitary insufficiency).  Since on the clomid, he feels better in general.   Pt also pt returns for f/u of type 2 DM (dx'ed 2007; he has mild if any neuropathy of the lower extremities; he is unaware of any associated chronic complications; he has never been on insulin); no cbg record, but states cbg's are well-controlled.  he denies hypoglycemia.  Past Medical History  Diagnosis Date  . HIV disease 1990    [facility]  . Hypertension   . HEMOCHROMATOSIS, HX OF 12/15/2005    phlebotomy every 6 to 8 weeks--[facility]  . GOUT 06/22/2006  . GILBERT'S SYNDROME 02/25/2007  . DIABETES MELLITUS, TYPE II, UNCONTROLLED 06/22/2006  . Diabetes mellitus     on oral meds-no insulin  . HNP (herniated nucleus pulposus)     LUMBAR PAIN WITH PAIN DOWN LEFT LEG TO TOES-SOME NUMBNESS AND TINGLING IN BIG TOE  . Complication of anesthesia     ANESTHESIA AWARENESS  DURING ADRENALECTOMY AND COLONOSCOPY    Past Surgical History  Procedure Laterality Date  . Cholecystectomy    . Adrenalectomy      RIGHT ADRENALECTOMY   . Orif left shoulder      STILL HAS HARDWARE IN THE SHOULDER  . Lumbar laminectomy/decompression microdiscectomy  04/23/2011    Procedure: LUMBAR LAMINECTOMY/DECOMPRESSION MICRODISCECTOMY;  Surgeon: Johnn Hai, MD;  Location: [facility];  Service: Orthopedics;  Laterality: N/A;  Decompression L5 S1 (X-Ray)    History   Social History  . Marital Status: Single    Spouse Name: N/A    Number of Children: N/A  . Years of Education: N/A   Occupational History  . Not on file.   Social History Main Topics  . Smoking status: Never Smoker   . Smokeless tobacco: Never  Used  . Alcohol Use: No  . Drug Use: No  . Sexual Activity: No     Comment: pt. declined condoms   Other Topics Concern  . Not on file   Social History Narrative  . No narrative on file    Current Outpatient Prescriptions on File Prior to Visit  Medication Sig Dispense Refill  . allopurinol (ZYLOPRIM) 300 MG tablet Take 300 mg by mouth daily.       Marland Kitchen AMLODIPINE BESYLATE PO Take 10 mg by mouth daily before breakfast.       . ATRIPLA 600-200-300 MG per tablet TAKE 1 TABLET BY MOUTH EVERY NIGHT AT BEDTIME.  30 tablet  5  . carvedilol (COREG) 25 MG tablet Take 25 mg by mouth daily.       . clomiPHENE (CLOMID) 50 MG tablet 1/4 tab daily  10 tablet  1  . colchicine 0.6 MG tablet Take 0.6 mg by mouth as needed.       Marland Kitchen glucose blood (ACCU-CHEK AVIVA) test strip 1 each by Other route as needed. Use as instructed       . JANUVIA 100 MG tablet TAKE 1 TABLET BY MOUTH EVERY DAY  90 tablet  0  . Lancets (ACCU-CHEK MULTICLIX) lancets 1 each by Other route as needed. Use as instructed       . metFORMIN (  GLUCOPHAGE-XR) 500 MG 24 hr tablet Take 1,000 mg by mouth 2 (two) times daily. 4 tabs daily      . metFORMIN (GLUCOPHAGE-XR) 500 MG 24 hr tablet Take 4 tablets by mouth  daily  360 tablet  1  . telmisartan (MICARDIS) 80 MG tablet Take 80 mg by mouth daily before breakfast.       . vitamin B-12 (CYANOCOBALAMIN) 1000 MCG tablet Take 1,000 mcg by mouth daily.       No current facility-administered medications on file prior to visit.    Allergies  Allergen Reactions  . Codeine Itching  . Meperidine Hcl Itching  . Scopolamine     GIVEN WITH A SURGERY YRS AGO--CAUSED HALLUNCINATIONS    Family History  Problem Relation Age of Onset  . Hypertension Mother   . Hyperlipidemia Mother   . Atrial fibrillation Mother   . Hyperlipidemia Father   . Hypertension Father   . Atrial fibrillation Father     BP 122/60  Pulse 96  Temp(Src) 98.4 F (36.9 C) (Oral)  Ht 5\' 9"  (1.753 m)  Wt 221 lb  (100.245 kg)  BMI 32.62 kg/m2  SpO2 96%   Review of Systems He denies decreased urinary stream and weight gain.      Objective:   Physical Exam VITAL SIGNS:  See vs page GENERAL: no distress.  outside test results are reviewed: A1c=6.1 Lab Results  Component Value Date   TESTOSTERONE 228* 07/22/2013      Assessment & Plan:  Hypogonadism: improved.   DM: well-controlled. pituitary insufficiency: we'll do other pituitary testing when pt returns, although there is no clinical evidence of this.

## 2013-07-22 NOTE — Patient Instructions (Addendum)
Please continue the same diabetes medications. check your blood sugar once a day.  vary the time of day when you check, between before the 3 meals, and at bedtime.  also check if you have symptoms of your blood sugar being too high or too low.  please keep a record of the readings and bring it to your next appointment here.  You can write it on any piece of paper.  please call us sooner if your blood sugar goes below 70, or if you have a lot of readings over 200. Please return in 6 months.   blood tests are being requested for you today.  We'll contact you with results.

## 2013-07-23 ENCOUNTER — Encounter: Payer: Self-pay | Admitting: Endocrinology

## 2013-07-23 LAB — HEMOGLOBIN A1C
Hgb A1c MFr Bld: 6.1 % — ABNORMAL HIGH (ref <5.7)
Mean Plasma Glucose: 128 mg/dL — ABNORMAL HIGH (ref <117)

## 2013-07-23 LAB — TESTOSTERONE: Testosterone: 228 ng/dL — ABNORMAL LOW (ref 300–890)

## 2013-08-28 ENCOUNTER — Other Ambulatory Visit: Payer: Self-pay | Admitting: Endocrinology

## 2013-08-29 ENCOUNTER — Other Ambulatory Visit: Payer: Self-pay | Admitting: *Deleted

## 2013-08-29 MED ORDER — CLOMIPHENE CITRATE 50 MG PO TABS: ORAL_TABLET | ORAL | Status: DC

## 2013-09-04 ENCOUNTER — Other Ambulatory Visit: Payer: Self-pay | Admitting: Endocrinology

## 2013-09-05 ENCOUNTER — Other Ambulatory Visit: Payer: Self-pay | Admitting: *Deleted

## 2013-09-05 MED ORDER — SITAGLIPTIN PHOSPHATE 100 MG PO TABS: 100.0000 mg | ORAL_TABLET | Freq: Every day | ORAL | Status: DC

## 2013-10-13 ENCOUNTER — Other Ambulatory Visit: Payer: PRIVATE HEALTH INSURANCE

## 2013-10-13 DIAGNOSIS — B20 Human immunodeficiency virus [HIV] disease: Secondary | ICD-10-CM

## 2013-10-13 DIAGNOSIS — Z113 Encounter for screening for infections with a predominantly sexual mode of transmission: Secondary | ICD-10-CM

## 2013-10-13 LAB — CBC WITH DIFFERENTIAL/PLATELET
Basophils Absolute: 0.1 10*3/uL (ref 0.0–0.1)
Basophils Relative: 1 % (ref 0–1)
Eosinophils Absolute: 0.2 10*3/uL (ref 0.0–0.7)
Eosinophils Relative: 2 % (ref 0–5)
HCT: 40.9 % (ref 39.0–52.0)
Hemoglobin: 14.5 g/dL (ref 13.0–17.0)
Lymphocytes Relative: 45 % (ref 12–46)
Lymphs Abs: 3.8 10*3/uL (ref 0.7–4.0)
MCH: 35.3 pg — ABNORMAL HIGH (ref 26.0–34.0)
MCHC: 35.5 g/dL (ref 30.0–36.0)
MCV: 99.5 fL (ref 78.0–100.0)
Monocytes Absolute: 0.5 10*3/uL (ref 0.1–1.0)
Monocytes Relative: 6 % (ref 3–12)
Neutro Abs: 3.9 10*3/uL (ref 1.7–7.7)
Neutrophils Relative %: 46 % (ref 43–77)
Platelets: 176 10*3/uL (ref 150–400)
RBC: 4.11 MIL/uL — ABNORMAL LOW (ref 4.22–5.81)
RDW: 13.7 % (ref 11.5–15.5)
WBC: 8.5 10*3/uL (ref 4.0–10.5)

## 2013-10-14 LAB — COMPLETE METABOLIC PANEL WITH GFR
ALT: 27 U/L (ref 0–53)
AST: 41 U/L — ABNORMAL HIGH (ref 0–37)
Albumin: 4.4 g/dL (ref 3.5–5.2)
Alkaline Phosphatase: 103 U/L (ref 39–117)
BUN: 19 mg/dL (ref 6–23)
CO2: 25 mEq/L (ref 19–32)
Calcium: 10.1 mg/dL (ref 8.4–10.5)
Chloride: 104 mEq/L (ref 96–112)
Creat: 1.09 mg/dL (ref 0.50–1.35)
GFR, Est African American: 84 mL/min
GFR, Est Non African American: 73 mL/min
Glucose, Bld: 121 mg/dL — ABNORMAL HIGH (ref 70–99)
Potassium: 5 mEq/L (ref 3.5–5.3)
Sodium: 138 mEq/L (ref 135–145)
Total Bilirubin: 0.8 mg/dL (ref 0.2–1.2)
Total Protein: 7.3 g/dL (ref 6.0–8.3)

## 2013-10-14 LAB — HIV-1 RNA QUANT-NO REFLEX-BLD
HIV 1 RNA Quant: <20 copies/mL (ref <20)
HIV-1 RNA Quant, Log: <1.3 {Log} (ref <1.30)

## 2013-10-14 LAB — RPR

## 2013-10-14 LAB — T-HELPER CELL (CD4) - (RCID CLINIC ONLY)
CD4 % Helper T Cell: 21 % — ABNORMAL LOW (ref 33–55)
CD4 T Cell Abs: 890 /uL (ref 400–2700)

## 2013-10-27 ENCOUNTER — Ambulatory Visit: Payer: PRIVATE HEALTH INSURANCE | Admitting: Infectious Diseases

## 2013-10-27 ENCOUNTER — Other Ambulatory Visit: Payer: Self-pay | Admitting: Endocrinology

## 2013-11-01 ENCOUNTER — Ambulatory Visit: Payer: PRIVATE HEALTH INSURANCE | Admitting: Infectious Diseases

## 2013-11-07 ENCOUNTER — Ambulatory Visit: Payer: PRIVATE HEALTH INSURANCE | Admitting: Infectious Diseases

## 2013-11-30 ENCOUNTER — Encounter: Payer: Self-pay | Admitting: Infectious Diseases

## 2013-11-30 ENCOUNTER — Ambulatory Visit (INDEPENDENT_AMBULATORY_CARE_PROVIDER_SITE_OTHER): Payer: PRIVATE HEALTH INSURANCE | Admitting: Infectious Diseases

## 2013-11-30 VITALS — BP 145/87 | HR 98 | Temp 98.1°F | Ht 69.0 in | Wt 229.0 lb

## 2013-11-30 DIAGNOSIS — E1165 Type 2 diabetes mellitus with hyperglycemia: Secondary | ICD-10-CM

## 2013-11-30 DIAGNOSIS — Z23 Encounter for immunization: Secondary | ICD-10-CM

## 2013-11-30 DIAGNOSIS — Z113 Encounter for screening for infections with a predominantly sexual mode of transmission: Secondary | ICD-10-CM

## 2013-11-30 DIAGNOSIS — IMO0001 Reserved for inherently not codable concepts without codable children: Secondary | ICD-10-CM

## 2013-11-30 DIAGNOSIS — B20 Human immunodeficiency virus [HIV] disease: Secondary | ICD-10-CM

## 2013-11-30 DIAGNOSIS — E230 Hypopituitarism: Secondary | ICD-10-CM

## 2013-11-30 DIAGNOSIS — E785 Hyperlipidemia, unspecified: Secondary | ICD-10-CM

## 2013-11-30 NOTE — Assessment & Plan Note (Signed)
Appears to be well controlled. Appreciate Dr Cordelia Pen f/u.

## 2013-11-30 NOTE — Progress Notes (Signed)
   Subjective:    Patient ID: [name], male    DOB: [birth year], 63 y.o.   MRN: [id number]  HPI  63 yo M with hx of hemochromatosis, DM2 (dx 2007), secondary hypogonadism (on clomid), hyperlipidemia, lumbar laminectomy/decompression (04-2011), and HIV+ since 1990 when he was dx on insurance physical. He has been maintained on atripla (his only rx). Has been getting colonoscopies.   HIV 1 RNA Quant (copies/mL)  Date Value  10/13/2013 <20   04/13/2013 <20   09/07/2012 <20      CD4 T Cell Abs (/uL)  Date Value  10/13/2013 890   04/13/2013 710   09/07/2012 680    Needs Hep B S Ab  Needs new PCP as Dr Leanne Chang is moving away from practice.  No problems with atripla.  His fatigue has improved with clomid.  HgBA1C last was 6.1%. LE numbness started after laminectomy. Has been stable.   Review of Systems  Constitutional: Negative for appetite change and unexpected weight change.  Gastrointestinal: Negative for diarrhea and constipation.  Genitourinary: Negative for difficulty urinating.  Neurological: Positive for numbness.       Objective:   Physical Exam  Constitutional: He appears well-developed and well-nourished.  HENT:  Mouth/Throat: No oropharyngeal exudate.  Eyes: EOM are normal. Pupils are equal, round, and reactive to light.  Neck: Neck supple.  Cardiovascular: Normal rate, regular rhythm and normal heart sounds.   Pulmonary/Chest: Effort normal and breath sounds normal.  Abdominal: Soft. Bowel sounds are normal. He exhibits no distension. There is no tenderness.  Musculoskeletal:  No edema, no diabetic foot lesions.   Lymphadenopathy:    He has no cervical adenopathy.          Assessment & Plan:

## 2013-11-30 NOTE — Assessment & Plan Note (Signed)
He is doing very well. Will continue his current art. He is offered/refuses condoms. Will see him back in 6 months. Gets flu and hep b restart today.

## 2013-11-30 NOTE — Assessment & Plan Note (Signed)
Greatly appreciate Dr Cordelia Pen f/u. He appears to be doing well.

## 2013-11-30 NOTE — Assessment & Plan Note (Signed)
He needs to be started on fibrate. Will f/u with new PCP.

## 2013-12-01 ENCOUNTER — Other Ambulatory Visit: Payer: Self-pay | Admitting: Endocrinology

## 2013-12-01 LAB — URINE CYTOLOGY ANCILLARY ONLY
Chlamydia: NEGATIVE
Neisseria Gonorrhea: NEGATIVE

## 2013-12-01 NOTE — Addendum Note (Signed)
Addended by: Landis Gandy on: 12/01/2013 10:27 AM   Modules accepted: Orders

## 2013-12-07 ENCOUNTER — Other Ambulatory Visit: Payer: Self-pay | Admitting: Endocrinology

## 2013-12-15 ENCOUNTER — Telehealth: Payer: Self-pay | Admitting: *Deleted

## 2013-12-15 MED ORDER — SITAGLIPTIN PHOSPHATE 100 MG PO TABS: ORAL_TABLET | ORAL | Status: DC

## 2013-12-15 NOTE — Telephone Encounter (Signed)
PT advised that medication has been sent to his pharmacy. Pt informed that insurance company will only fill 30 day supply.

## 2013-12-29 ENCOUNTER — Ambulatory Visit (INDEPENDENT_AMBULATORY_CARE_PROVIDER_SITE_OTHER): Payer: PRIVATE HEALTH INSURANCE | Admitting: *Deleted

## 2013-12-29 DIAGNOSIS — Z23 Encounter for immunization: Secondary | ICD-10-CM

## 2014-01-30 ENCOUNTER — Other Ambulatory Visit: Payer: Self-pay | Admitting: Infectious Diseases

## 2014-02-23 ENCOUNTER — Other Ambulatory Visit: Payer: Self-pay | Admitting: Endocrinology

## 2014-03-01 ENCOUNTER — Other Ambulatory Visit: Payer: Self-pay | Admitting: Infectious Diseases

## 2014-03-01 DIAGNOSIS — B20 Human immunodeficiency virus [HIV] disease: Secondary | ICD-10-CM

## 2014-03-15 ENCOUNTER — Other Ambulatory Visit: Payer: Self-pay | Admitting: Endocrinology

## 2014-04-20 ENCOUNTER — Other Ambulatory Visit: Payer: Self-pay | Admitting: Endocrinology

## 2014-05-17 ENCOUNTER — Other Ambulatory Visit: Payer: PRIVATE HEALTH INSURANCE

## 2014-05-17 DIAGNOSIS — E785 Hyperlipidemia, unspecified: Secondary | ICD-10-CM

## 2014-05-17 DIAGNOSIS — B20 Human immunodeficiency virus [HIV] disease: Secondary | ICD-10-CM

## 2014-05-17 DIAGNOSIS — Z113 Encounter for screening for infections with a predominantly sexual mode of transmission: Secondary | ICD-10-CM

## 2014-05-17 LAB — COMPREHENSIVE METABOLIC PANEL
ALT: 22 U/L (ref 0–53)
AST: 42 U/L — ABNORMAL HIGH (ref 0–37)
Albumin: 4 g/dL (ref 3.5–5.2)
Alkaline Phosphatase: 96 U/L (ref 39–117)
BUN: 13 mg/dL (ref 6–23)
CO2: 22 mEq/L (ref 19–32)
Calcium: 9.3 mg/dL (ref 8.4–10.5)
Chloride: 103 mEq/L (ref 96–112)
Creat: 0.91 mg/dL (ref 0.50–1.35)
Glucose, Bld: 131 mg/dL — ABNORMAL HIGH (ref 70–99)
Potassium: 4.2 mEq/L (ref 3.5–5.3)
Sodium: 138 mEq/L (ref 135–145)
Total Bilirubin: 0.7 mg/dL (ref 0.2–1.2)
Total Protein: 7.1 g/dL (ref 6.0–8.3)

## 2014-05-17 LAB — CBC
HCT: 40.5 % (ref 39.0–52.0)
Hemoglobin: 14.3 g/dL (ref 13.0–17.0)
MCH: 35.8 pg — ABNORMAL HIGH (ref 26.0–34.0)
MCHC: 35.3 g/dL (ref 30.0–36.0)
MCV: 101.3 fL — ABNORMAL HIGH (ref 78.0–100.0)
MPV: 10.8 fL (ref 8.6–12.4)
Platelets: 154 10*3/uL (ref 150–400)
RBC: 4 MIL/uL — ABNORMAL LOW (ref 4.22–5.81)
RDW: 13.7 % (ref 11.5–15.5)
WBC: 7.2 10*3/uL (ref 4.0–10.5)

## 2014-05-17 LAB — LIPID PANEL
Cholesterol: 173 mg/dL (ref 0–200)
HDL: 22 mg/dL — ABNORMAL LOW (ref >=40)
Total CHOL/HDL Ratio: 7.9 Ratio
Triglycerides: 1130 mg/dL — ABNORMAL HIGH (ref <150)

## 2014-05-18 LAB — RPR

## 2014-05-19 LAB — T-HELPER CELL (CD4) - (RCID CLINIC ONLY)
CD4 % Helper T Cell: 21 % — ABNORMAL LOW (ref 33–55)
CD4 T Cell Abs: 740 /uL (ref 400–2700)

## 2014-05-21 LAB — HIV-1 RNA QUANT-NO REFLEX-BLD
HIV 1 RNA Quant: <20 copies/mL (ref <20)
HIV-1 RNA Quant, Log: <1.3 {Log} (ref <1.30)

## 2014-05-31 ENCOUNTER — Encounter: Payer: Self-pay | Admitting: Infectious Diseases

## 2014-05-31 ENCOUNTER — Ambulatory Visit (INDEPENDENT_AMBULATORY_CARE_PROVIDER_SITE_OTHER): Payer: PRIVATE HEALTH INSURANCE | Admitting: Infectious Diseases

## 2014-05-31 VITALS — BP 140/85 | HR 88 | Temp 97.6°F | Ht 68.0 in | Wt 225.0 lb

## 2014-05-31 DIAGNOSIS — Z23 Encounter for immunization: Secondary | ICD-10-CM | POA: Diagnosis not present

## 2014-05-31 DIAGNOSIS — E785 Hyperlipidemia, unspecified: Secondary | ICD-10-CM | POA: Diagnosis not present

## 2014-05-31 DIAGNOSIS — E118 Type 2 diabetes mellitus with unspecified complications: Secondary | ICD-10-CM | POA: Diagnosis not present

## 2014-05-31 DIAGNOSIS — B20 Human immunodeficiency virus [HIV] disease: Secondary | ICD-10-CM | POA: Diagnosis not present

## 2014-05-31 NOTE — Progress Notes (Signed)
   Subjective:    Patient ID: [name], male    DOB: August 09, 1950, 64 y.o.   MRN: [id number]  HPI 64 yo M with hx of hemochromatosis, DM2 (dx 2007), secondary hypogonadism (on clomid), hyperlipidemia, lumbar laminectomy/decompression (04-2011), and HIV+ since 1990 when he was dx on insurance physical. He has been maintained on atripla (his only rx).  Sugars have been good, 120-130 this year. Has been doing water aerobics, walking.  His Trig are quite high, he has PCP appt scheduled.   HIV 1 RNA QUANT (copies/mL)  Date Value  05/17/2014 <20  10/13/2013 <20  04/13/2013 <20   CD4 T CELL ABS (/uL)  Date Value  05/17/2014 740  10/13/2013 890  04/13/2013 710   Lab Results  Component Value Date   CHOL 173 05/17/2014   HDL 22* 05/17/2014   LDLCALC NOT CALC 05/17/2014   LDLDIRECT 55.9 11/08/2010   TRIG 1130* 05/17/2014   CHOLHDL 7.9 05/17/2014    Had ophtho in Oct/Nov 2015.  Wearing seat belt.   Review of Systems  Constitutional: Negative for unexpected weight change.  Eyes: Negative for visual disturbance.  Gastrointestinal: Negative for diarrhea and constipation.  Genitourinary: Negative for difficulty urinating.  Neurological: Positive for numbness.      Objective:   Physical Exam  Constitutional: He appears well-developed and well-nourished.  HENT:  Mouth/Throat: No oropharyngeal exudate.  Eyes: EOM are normal. Pupils are equal, round, and reactive to light.  Neck: Neck supple.  Cardiovascular: Normal rate, regular rhythm and normal heart sounds.   Pulmonary/Chest: Effort normal and breath sounds normal.  Abdominal: Soft. Bowel sounds are normal. There is no tenderness. There is no rebound.  Musculoskeletal: He exhibits no edema.  No diabetic foot lesions.   Lymphadenopathy:    He has no cervical adenopathy.  Neurological:  Light touch grossly          Assessment & Plan:

## 2014-05-31 NOTE — Assessment & Plan Note (Signed)
He's doing very well, will continue his atripla.  Offered/refused condoms.  Gets 3rd hep b today (2nd round).  Will see him back in 6 months.

## 2014-05-31 NOTE — Assessment & Plan Note (Signed)
Has had foot and eye exams.  Will f/u with PCP.  Has fairly good control

## 2014-05-31 NOTE — Assessment & Plan Note (Signed)
He will f/u with his new pcp regarding pharmacoptherapy.  He will continue to watch diet and exercise.

## 2014-05-31 NOTE — Addendum Note (Signed)
Addended by: Landis Gandy on: 05/31/2014 09:41 AM   Modules accepted: Orders, Medications

## 2014-06-13 ENCOUNTER — Ambulatory Visit: Payer: PRIVATE HEALTH INSURANCE | Admitting: Endocrinology

## 2014-06-14 ENCOUNTER — Ambulatory Visit (INDEPENDENT_AMBULATORY_CARE_PROVIDER_SITE_OTHER): Payer: PRIVATE HEALTH INSURANCE | Admitting: Endocrinology

## 2014-06-14 ENCOUNTER — Encounter: Payer: Self-pay | Admitting: Endocrinology

## 2014-06-14 VITALS — BP 128/62 | HR 86 | Temp 98.6°F | Ht 68.0 in | Wt 224.0 lb

## 2014-06-14 DIAGNOSIS — E118 Type 2 diabetes mellitus with unspecified complications: Secondary | ICD-10-CM

## 2014-06-14 DIAGNOSIS — E291 Testicular hypofunction: Secondary | ICD-10-CM | POA: Diagnosis not present

## 2014-06-14 MED ORDER — GLUCOSE BLOOD VI STRP: 1.0000 | ORAL_STRIP | Freq: Every day | Status: DC

## 2014-06-14 NOTE — Progress Notes (Signed)
Subjective:    Patient ID: [name], male    DOB: 1950-12-25, 64 y.o.   MRN: [id number]  HPI pt returns for f/u of idiopathic secondary hypogonadism (dx'ed early 2015; pituitary MRI was normal; he does not want fertility).  Since on the clomid, he feels better in general.   Pt returns for f/u of diabetes mellitus: DM type: 2 Dx'ed: 1884 Complications: none Therapy: 2 oral meds DKA: never Severe hypoglycemia: never Pancreatitis: never Other: he has never been on insulin Interval history: no cbg record, but states cbg's are in the mid to high-100's.  he denies hypoglycemia.  Past Medical History  Diagnosis Date  . HIV disease 1990    [facility]  . Hypertension   . HEMOCHROMATOSIS, HX OF 12/15/2005    phlebot[facility] every 6 to 8 weeks--[facility]  . GOUT 06/22/2006  . GILBERT'S SYNDROME 02/25/2007  . DIABETES MELLITUS, TYPE II, UNCONTROLLED 06/22/2006  . Diabetes mellitus     on oral meds-no insulin  . HNP (herniated nucleus pulposus)     LUMBAR PAIN WITH PAIN DOWN LEFT LEG TO TOES-SOME NUMBNESS AND TINGLING IN BIG TOE  . Complication of anesthesia     ANESTHESIA AWARENESS  DURING ADRENALECTOMY AND COLONOSCOPY    Past Surgical History  Procedure Laterality Date  . Cholecystectomy    . Adrenalectomy      RIGHT ADRENALECTOMY   . Orif left shoulder      STILL HAS HARDWARE IN THE SHOULDER  . Lumbar laminectomy/decompression microdiscectomy  04/23/2011    Procedure: LUMBAR LAMINECTOMY/DECOMPRESSION MICRODISCECTOMY;  Surgeon: Johnn Hai, MD;  Location: [facility];  Service: Orthopedics;  Laterality: N/A;  Decompression L5 S1 (X-Ray)    History   Social History  . Marital Status: Single    Spouse Name: N/A  . Number of Children: N/A  . Years of Education: N/A   Occupational History  . Not on file.   Social History Main Topics  . Smoking status: Never Smoker   . Smokeless tobacco: Never Used  . Alcohol Use: No   . Drug Use: No  . Sexual Activity: No     Comment: pt. declined condoms   Other Topics Concern  . Not on file   Social History Narrative    Current Outpatient Prescriptions on File Prior to Visit  Medication Sig Dispense Refill  . allopurinol (ZYLOPRIM) 300 MG tablet Take 300 mg by mouth daily.     Marland Kitchen AMLODIPINE BESYLATE PO Take 10 mg by mouth daily before breakfast.     . ATRIPLA 600-200-300 MG per tablet TAKE 1 TABLET BY MOUTH EVERY NIGHT AT BEDTIME 30 tablet 3  . carvedilol (COREG) 25 MG tablet Take 25 mg by mouth daily.     . clomiPHENE (CLOMID) 50 MG tablet Take 1/4 tablet by mouth every day. *APPOINTMENT NEEDED FOR FURTHER REFILLS* 10 tablet 0  . colchicine 0.6 MG tablet Take 0.6 mg by mouth as needed.     Marland Kitchen JANUVIA 100 MG tablet TAKE 1 TABLET BY MOUTH EVERY DAY 30 tablet 0  . Lancets (ACCU-CHEK MULTICLIX) lancets 1 each by Other route as needed. Use as instructed     . metFORMIN (GLUCOPHAGE-XR) 500 MG 24 hr tablet Take 4 tablets by mouth  daily 360 tablet 2  . telmisartan (MICARDIS) 80 MG tablet Take 80 mg by mouth daily before breakfast.      No current facility-administered medications on file prior to visit.  Allergies  Allergen Reactions  . Codeine Itching  . Meperidine Hcl Itching  . Scopolamine     GIVEN WITH A SURGERY YRS AGO--CAUSED HALLUNCINATIONS    Family History  Problem Relation Age of Onset  . Hypertension Mother   . Hyperlipidemia Mother   . Atrial fibrillation Mother   . Hyperlipidemia Father   . Hypertension Father   . Atrial fibrillation Father     BP 128/62 mmHg  Pulse 86  Temp(Src) 98.6 F (37 C) (Oral)  Ht 5\' 8"  (1.727 m)  Wt 224 lb (101.606 kg)  BMI 34.07 kg/m2  SpO2 97%    Review of Systems Denies decreased urinary stream and weight change.    Objective:   Physical Exam VITAL SIGNS:  See vs page GENERAL: no distress Pulses: dorsalis pedis intact bilat.   MSK: no deformity of the feet CV: 1+ bilat leg edema Skin:  no  ulcer on the feet.  normal color and temp on the feet. Neuro: sensation is intact to touch on the feet, but decreased from normal.   Ext: There is bilateral onychomycosis of the toenails   Lab Results  Component Value Date   HGBA1C 7.1* 06/14/2014   Lab Results  Component Value Date   TESTOSTERONE 182.69* 06/14/2014       Assessment & Plan:  DM: slightly worse Hypogonadism: worse (? Compliance)  Patient is advised the following: Patient Instructions  blood tests are requested for you today.  We'll let you know about the results. If it is high, we can add invokana.  normalization of testosterone is not known to harm you.  however, there are "theoretical" risks, including increased fertility, hair loss, prostate cancer, benign prostate enlargement, blood clots, liver problems, lower hdl ("good cholesterol"), polycythemia (opposite of anemia), sleep apnea, and behavior changes check your blood sugar once a day.  vary the time of day when you check, between before the 3 meals, and at bedtime.  also check if you have symptoms of your blood sugar being too high or too low.  please keep a record of the readings and bring it to your next appointment here.  You can write it on any piece of paper.  please call us sooner if your blood sugar goes below 70, or if you have a lot of readings over 200. Please come back for a follow-up appointment in 3 months.   Here is a new meter.  i have sent a prescription to your pharmacy, for strips.  addendum: i have sent a prescription to your pharmacy, to add invokana.  Do you ever miss the clomid?

## 2014-06-14 NOTE — Patient Instructions (Addendum)
blood tests are requested for you today.  We'll let you know about the results. If it is high, we can add invokana.  normalization of testosterone is not known to harm you.  however, there are "theoretical" risks, including increased fertility, hair loss, prostate cancer, benign prostate enlargement, blood clots, liver problems, lower hdl ("good cholesterol"), polycythemia (opposite of anemia), sleep apnea, and behavior changes check your blood sugar once a day.  vary the time of day when you check, between before the 3 meals, and at bedtime.  also check if you have symptoms of your blood sugar being too high or too low.  please keep a record of the readings and bring it to your next appointment here.  You can write it on any piece of paper.  please call us sooner if your blood sugar goes below 70, or if you have a lot of readings over 200. Please come back for a follow-up appointment in 3 months.   Here is a new meter.  i have sent a prescription to your pharmacy, for strips.

## 2014-06-15 ENCOUNTER — Telehealth: Payer: Self-pay | Admitting: Endocrinology

## 2014-06-15 LAB — HEMOGLOBIN A1C: Hgb A1c MFr Bld: 7.1 % — ABNORMAL HIGH (ref 4.6–6.5)

## 2014-06-15 LAB — TESTOSTERONE: Testosterone: 182.69 ng/dL — ABNORMAL LOW (ref 300.00–890.00)

## 2014-06-15 MED ORDER — CANAGLIFLOZIN 300 MG PO TABS: 300.0000 mg | ORAL_TABLET | Freq: Every day | ORAL | Status: DC

## 2014-06-16 ENCOUNTER — Encounter: Payer: Self-pay | Admitting: Endocrinology

## 2014-06-17 ENCOUNTER — Other Ambulatory Visit: Payer: Self-pay | Admitting: Endocrinology

## 2014-06-17 MED ORDER — CLOMIPHENE CITRATE 50 MG PO TABS: ORAL_TABLET | ORAL | Status: DC

## 2014-06-18 ENCOUNTER — Encounter: Payer: Self-pay | Admitting: Endocrinology

## 2014-06-21 ENCOUNTER — Telehealth: Payer: Self-pay | Admitting: Endocrinology

## 2014-06-21 MED ORDER — CLOMIPHENE CITRATE 50 MG PO TABS: ORAL_TABLET | ORAL | Status: DC

## 2014-06-21 MED ORDER — CANAGLIFLOZIN 300 MG PO TABS: 300.0000 mg | ORAL_TABLET | Freq: Every day | ORAL | Status: DC

## 2014-06-21 NOTE — Telephone Encounter (Signed)
Rx sent to pharmacy   

## 2014-06-21 NOTE — Telephone Encounter (Signed)
Please resend clomiphene and invokana to the [facility] in [place]

## 2014-06-22 ENCOUNTER — Telehealth: Payer: Self-pay | Admitting: Endocrinology

## 2014-06-22 NOTE — Telephone Encounter (Signed)
please call or message patient: We got PA form for clomiphene. i am happy to do the PA.  However, there is a charge for PA, and it is unlikely to be approved. i hope to save you the money.  It is cheap to but without insurance at [employer]. Please let me know.

## 2014-06-23 NOTE — Telephone Encounter (Signed)
Left voicemail advising of note below. Requested call back from pt to discuss how he would like to proceed.

## 2014-07-06 ENCOUNTER — Other Ambulatory Visit: Payer: Self-pay | Admitting: Infectious Diseases

## 2014-07-06 DIAGNOSIS — B20 Human immunodeficiency virus [HIV] disease: Secondary | ICD-10-CM

## 2014-07-17 ENCOUNTER — Other Ambulatory Visit: Payer: Self-pay | Admitting: Endocrinology

## 2014-07-29 ENCOUNTER — Other Ambulatory Visit: Payer: Self-pay | Admitting: Endocrinology

## 2014-08-13 ENCOUNTER — Other Ambulatory Visit: Payer: Self-pay | Admitting: Endocrinology

## 2014-08-21 ENCOUNTER — Encounter: Payer: Self-pay | Admitting: Endocrinology

## 2014-08-28 ENCOUNTER — Other Ambulatory Visit: Payer: Self-pay

## 2014-08-28 ENCOUNTER — Telehealth: Payer: Self-pay | Admitting: Endocrinology

## 2014-08-28 MED ORDER — METFORMIN HCL ER 500 MG PO TB24: ORAL_TABLET | ORAL | Status: DC

## 2014-08-28 NOTE — Telephone Encounter (Signed)
Requested call back from the pt to discuss.

## 2014-08-28 NOTE — Telephone Encounter (Signed)
Please call pt back concerning medication call [phone]

## 2014-08-28 NOTE — Telephone Encounter (Signed)
I contacted the pt. Pt requested a refill of his metformin to be sent to [facility]. Fax # [phone].

## 2014-09-04 ENCOUNTER — Other Ambulatory Visit: Payer: Self-pay

## 2014-09-13 ENCOUNTER — Encounter: Payer: Self-pay | Admitting: Endocrinology

## 2014-09-13 ENCOUNTER — Ambulatory Visit (INDEPENDENT_AMBULATORY_CARE_PROVIDER_SITE_OTHER): Payer: PRIVATE HEALTH INSURANCE | Admitting: Endocrinology

## 2014-09-13 VITALS — BP 126/84 | HR 67 | Temp 98.2°F | Ht 68.0 in | Wt 217.0 lb

## 2014-09-13 DIAGNOSIS — E118 Type 2 diabetes mellitus with unspecified complications: Secondary | ICD-10-CM | POA: Diagnosis not present

## 2014-09-13 DIAGNOSIS — E291 Testicular hypofunction: Secondary | ICD-10-CM | POA: Diagnosis not present

## 2014-09-13 LAB — BASIC METABOLIC PANEL
BUN: 19 mg/dL (ref 6–23)
CO2: 22 mEq/L (ref 19–32)
Calcium: 9.3 mg/dL (ref 8.4–10.5)
Chloride: 103 mEq/L (ref 96–112)
Creatinine, Ser: 1.22 mg/dL (ref 0.40–1.50)
GFR: 63.87 mL/min (ref >60.00)
Glucose, Bld: 131 mg/dL — ABNORMAL HIGH (ref 70–99)
Potassium: 4.6 mEq/L (ref 3.5–5.1)
Sodium: 136 mEq/L (ref 135–145)

## 2014-09-13 LAB — TESTOSTERONE: Testosterone: 211.52 ng/dL — ABNORMAL LOW (ref 300.00–890.00)

## 2014-09-13 LAB — HEMOGLOBIN A1C: Hgb A1c MFr Bld: 6.1 % (ref 4.6–6.5)

## 2014-09-13 NOTE — Patient Instructions (Addendum)
blood tests are requested for you today.  We'll let you know about the results. Please come back for a follow-up appointment in 6 months.   normalization of a low testosterone is not known to harm you.  however, there are "theoretical" risks, including increased fertility, hair loss, prostate cancer, benign prostate enlargement, blood clots, liver problems, lower hdl ("good cholesterol"), polycythemia (opposite of anemia), sleep apnea, and behavior changes.

## 2014-09-13 NOTE — Progress Notes (Signed)
Subjective:    Patient ID: [name], male    DOB: [birth year], 64 y.o.   MRN: [id number]  HPI pt returns for f/u of idiopathic secondary hypogonadism (dx'ed early 2015; pituitary MRI was normal; he does not want fertility).  Since on the increased dosage of clomid, he feels better in general.   Pt returns for f/u of diabetes mellitus: DM type: 2 Dx'ed: 5277 Complications: none Therapy: 3 oral meds DKA: never Severe hypoglycemia: never Pancreatitis: never Other: he has never been on insulin Interval history: no cbg record, but states cbg's are well-controlled.  he denies hypoglycemia.  Past Medical History  Diagnosis Date  . HIV disease 1990    [facility]  . Hypertension   . HEMOCHROMATOSIS, HX OF 12/15/2005    phlebotomy every 6 to 8 weeks--at [facility]  . GOUT 06/22/2006  . GILBERT'S SYNDROME 02/25/2007  . DIABETES MELLITUS, TYPE II, UNCONTROLLED 06/22/2006  . Diabetes mellitus     on oral meds-no insulin  . HNP (herniated nucleus pulposus)     LUMBAR PAIN WITH PAIN DOWN LEFT LEG TO TOES-SOME NUMBNESS AND TINGLING IN BIG TOE  . Complication of anesthesia     ANESTHESIA AWARENESS  DURING ADRENALECTOMY AND COLONOSCOPY    Past Surgical History  Procedure Laterality Date  . Cholecystectomy    . Adrenalectomy      RIGHT ADRENALECTOMY   . Orif left shoulder      STILL HAS HARDWARE IN THE SHOULDER  . Lumbar laminectomy/decompression microdiscectomy  04/23/2011    Procedure: LUMBAR LAMINECTOMY/DECOMPRESSION MICRODISCECTOMY;  Surgeon: Johnn Hai, MD;  Location: [facility];  Service: Orthopedics;  Laterality: N/A;  Decompression L5 S1 (X-Ray)    History   Social History  . Marital Status: Single    Spouse Name: N/A  . Number of Children: N/A  . Years of Education: N/A   Occupational History  . Not on file.   Social History Main Topics  . Smoking status: Never Smoker   . Smokeless tobacco: Never Used  .  Alcohol Use: No  . Drug Use: No  . Sexual Activity: No     Comment: pt. declined condoms   Other Topics Concern  . Not on file   Social History Narrative    Current Outpatient Prescriptions on File Prior to Visit  Medication Sig Dispense Refill  . allopurinol (ZYLOPRIM) 300 MG tablet Take 300 mg by mouth daily.     Marland Kitchen AMLODIPINE BESYLATE PO Take 10 mg by mouth daily before breakfast.     . ATRIPLA 600-200-300 MG per tablet TAKE 1 TABLET BY MOUTH EVERY NIGHT AT BEDTIME 30 tablet 5  . canagliflozin (INVOKANA) 300 MG TABS tablet Take 300 mg by mouth daily. 30 tablet 3  . carvedilol (COREG) 25 MG tablet Take 25 mg by mouth daily.     . colchicine 0.6 MG tablet Take 0.6 mg by mouth as needed.     Marland Kitchen glucose blood (ONETOUCH VERIO) test strip 1 each by Other route daily. And lancets 1/day 100 each 12  . JANUVIA 100 MG tablet TAKE 1 TABLET BY MOUTH EVERY DAY 90 tablet 0  . Lancets (ACCU-CHEK MULTICLIX) lancets 1 each by Other route as needed. Use as instructed     . metFORMIN (GLUCOPHAGE-XR) 500 MG 24 hr tablet Take 4 tablets by mouth  daily 360 tablet 2  . telmisartan (MICARDIS) 80 MG tablet Take 80 mg by mouth daily before breakfast.  No current facility-administered medications on file prior to visit.    Allergies  Allergen Reactions  . Codeine Itching  . Meperidine Hcl Itching  . Scopolamine     GIVEN WITH A SURGERY YRS AGO--CAUSED HALLUNCINATIONS    Family History  Problem Relation Age of Onset  . Hypertension Mother   . Hyperlipidemia Mother   . Atrial fibrillation Mother   . Hyperlipidemia Father   . Hypertension Father   . Atrial fibrillation Father     BP 126/84 mmHg  Pulse 67  Temp(Src) 98.2 F (36.8 C) (Oral)  Ht 5\' 8"  (1.727 m)  Wt 217 lb (98.431 kg)  BMI 33.00 kg/m2  SpO2 97%  Review of Systems He denies ED sxs and edema    Objective:   Physical Exam VITAL SIGNS:  See vs page GENERAL: no distress Pulses: dorsalis pedis intact bilat.   MSK: no  deformity of the feet CV: no leg edema Skin:  no ulcer on the feet.  normal color and temp on the feet. Neuro: sensation is intact to touch on the feet  Lab Results  Component Value Date   TESTOSTERONE 211.52* 09/13/2014   Lab Results  Component Value Date   HGBA1C 6.1 09/13/2014      Assessment & Plan:  DM: well-controlled.   Hypogonadism, persistent despite increased clomid.   Patient is advised the following: Patient Instructions  blood tests are requested for you today.  We'll let you know about the results. Please come back for a follow-up appointment in 6 months.   normalization of a low testosterone is not known to harm you.  however, there are "theoretical" risks, including increased fertility, hair loss, prostate cancer, benign prostate enlargement, blood clots, liver problems, lower hdl ("good cholesterol"), polycythemia (opposite of anemia), sleep apnea, and behavior changes.   addendum: i have sent a prescription to your pharmacy, to increase clomid.

## 2014-09-14 ENCOUNTER — Encounter: Payer: Self-pay | Admitting: Endocrinology

## 2014-09-14 LAB — CBC WITH DIFFERENTIAL/PLATELET
Basophils Absolute: 0 10*3/uL (ref 0.0–0.1)
Basophils Relative: 0.3 % (ref 0.0–3.0)
Eosinophils Absolute: 0.2 10*3/uL (ref 0.0–0.7)
Eosinophils Relative: 2.3 % (ref 0.0–5.0)
HCT: 45.3 % (ref 39.0–52.0)
Hemoglobin: 15.6 g/dL (ref 13.0–17.0)
Lymphocytes Relative: 49.8 % — ABNORMAL HIGH (ref 12.0–46.0)
Lymphs Abs: 4.5 10*3/uL — ABNORMAL HIGH (ref 0.7–4.0)
MCHC: 34.4 g/dL (ref 30.0–36.0)
MCV: 103.7 fl — ABNORMAL HIGH (ref 78.0–100.0)
Monocytes Absolute: 0.5 10*3/uL (ref 0.1–1.0)
Monocytes Relative: 6 % (ref 3.0–12.0)
Neutro Abs: 3.8 10*3/uL (ref 1.4–7.7)
Neutrophils Relative %: 41.6 % — ABNORMAL LOW (ref 43.0–77.0)
Platelets: 165 10*3/uL (ref 150.0–400.0)
RBC: 4.37 Mil/uL (ref 4.22–5.81)
RDW: 13.4 % (ref 11.5–15.5)
WBC: 9.1 10*3/uL (ref 4.0–10.5)

## 2014-09-14 MED ORDER — CLOMIPHENE CITRATE 50 MG PO TABS: 50.0000 mg | ORAL_TABLET | Freq: Every day | ORAL | Status: DC

## 2014-09-19 ENCOUNTER — Other Ambulatory Visit: Payer: Self-pay | Admitting: Endocrinology

## 2014-09-28 ENCOUNTER — Telehealth: Payer: Self-pay | Admitting: Endocrinology

## 2014-09-28 ENCOUNTER — Other Ambulatory Visit: Payer: Self-pay

## 2014-09-28 NOTE — Telephone Encounter (Signed)
Left voicemail advising of note below.

## 2014-09-28 NOTE — Telephone Encounter (Signed)
please call patient:  He has not read this message, so please read to him:    This message is to inform you that the patient has not yet read the following message. (Notification date: September 28, 2014)    RE: Visit Follow-Up Question    From  Renato Shin, MD   To  Kennedy Bohanon     Sent  09/14/2014 5:28 PM     Ok, i have sent a prescription to your pharmacy  i'll see you next time.       Previous Messages     ----- Message -----   FromBerneice Gandy   Sent: 09/14/2014 3:06 PM EDT    To: Renato Shin, MD  Subject: Visit Follow-Up Question   Dr Loanne Drilling.  I am very happy A1C is back in line. Thank you for all your help.   The testosterone let's increase the Chlomphene to 1 pill per day until my next visit it 6 months and reevaluate if you are ok with that.    I will need a [facility] script to [facility] on [address]    Marina Gravel Dr Loanne Drilling

## 2014-10-19 ENCOUNTER — Other Ambulatory Visit: Payer: Self-pay | Admitting: Endocrinology

## 2014-11-21 ENCOUNTER — Other Ambulatory Visit: Payer: Self-pay

## 2014-11-21 MED ORDER — SITAGLIPTIN PHOSPHATE 100 MG PO TABS: 100.0000 mg | ORAL_TABLET | Freq: Every day | ORAL | Status: DC

## 2014-11-21 MED ORDER — CANAGLIFLOZIN 300 MG PO TABS: 300.0000 mg | ORAL_TABLET | Freq: Every day | ORAL | Status: DC

## 2015-01-10 ENCOUNTER — Other Ambulatory Visit: Payer: Self-pay | Admitting: Infectious Diseases

## 2015-01-17 ENCOUNTER — Ambulatory Visit: Payer: PRIVATE HEALTH INSURANCE | Admitting: Endocrinology

## 2015-02-20 ENCOUNTER — Other Ambulatory Visit: Payer: Self-pay | Admitting: Endocrinology

## 2015-02-26 ENCOUNTER — Other Ambulatory Visit: Payer: Self-pay | Admitting: Endocrinology

## 2015-03-16 ENCOUNTER — Ambulatory Visit: Payer: PRIVATE HEALTH INSURANCE | Admitting: Endocrinology

## 2015-03-16 ENCOUNTER — Other Ambulatory Visit: Payer: Self-pay | Admitting: Infectious Diseases

## 2015-03-20 DIAGNOSIS — E896 Postprocedural adrenocortical (-medullary) hypofunction: Secondary | ICD-10-CM | POA: Insufficient documentation

## 2015-03-20 DIAGNOSIS — Z905 Acquired absence of kidney: Secondary | ICD-10-CM | POA: Insufficient documentation

## 2015-03-28 ENCOUNTER — Other Ambulatory Visit: Payer: Self-pay | Admitting: Endocrinology

## 2015-04-17 ENCOUNTER — Other Ambulatory Visit: Payer: Self-pay | Admitting: Infectious Diseases

## 2015-04-17 DIAGNOSIS — B20 Human immunodeficiency virus [HIV] disease: Secondary | ICD-10-CM

## 2015-04-18 ENCOUNTER — Telehealth: Payer: Self-pay | Admitting: *Deleted

## 2015-04-18 NOTE — Telephone Encounter (Signed)
Patient requesting refill of atripla.  Last seen 05/2014, was supposed to follow up in 6 months.  RN refilled 30 days +1 refill.  Left message asking patient to call and schedule lab/follow up this month. Landis Gandy, RN

## 2015-04-22 ENCOUNTER — Other Ambulatory Visit: Payer: Self-pay | Admitting: Endocrinology

## 2015-04-23 ENCOUNTER — Other Ambulatory Visit: Payer: PRIVATE HEALTH INSURANCE

## 2015-04-24 ENCOUNTER — Other Ambulatory Visit: Payer: PRIVATE HEALTH INSURANCE

## 2015-04-24 DIAGNOSIS — Z113 Encounter for screening for infections with a predominantly sexual mode of transmission: Secondary | ICD-10-CM

## 2015-04-24 DIAGNOSIS — B20 Human immunodeficiency virus [HIV] disease: Secondary | ICD-10-CM

## 2015-04-24 DIAGNOSIS — E785 Hyperlipidemia, unspecified: Secondary | ICD-10-CM

## 2015-04-24 LAB — COMPREHENSIVE METABOLIC PANEL
ALT: 17 U/L (ref 9–46)
AST: 25 U/L (ref 10–35)
Albumin: 4.2 g/dL (ref 3.6–5.1)
Alkaline Phosphatase: 114 U/L (ref 40–115)
BUN: 15 mg/dL (ref 7–25)
CO2: 23 mmol/L (ref 20–31)
Calcium: 9.3 mg/dL (ref 8.6–10.3)
Chloride: 106 mmol/L (ref 98–110)
Creat: 1.27 mg/dL — ABNORMAL HIGH (ref 0.70–1.25)
Glucose, Bld: 130 mg/dL — ABNORMAL HIGH (ref 65–99)
Potassium: 4.6 mmol/L (ref 3.5–5.3)
Sodium: 135 mmol/L (ref 135–146)
Total Bilirubin: 0.6 mg/dL (ref 0.2–1.2)
Total Protein: 7.1 g/dL (ref 6.1–8.1)

## 2015-04-24 LAB — CBC
HCT: 43.2 % (ref 39.0–52.0)
Hemoglobin: 15 g/dL (ref 13.0–17.0)
MCH: 34.6 pg — ABNORMAL HIGH (ref 26.0–34.0)
MCHC: 34.7 g/dL (ref 30.0–36.0)
MCV: 99.8 fL (ref 78.0–100.0)
MPV: 10.4 fL (ref 8.6–12.4)
Platelets: 171 10*3/uL (ref 150–400)
RBC: 4.33 MIL/uL (ref 4.22–5.81)
RDW: 13.6 % (ref 11.5–15.5)
WBC: 10.7 10*3/uL — ABNORMAL HIGH (ref 4.0–10.5)

## 2015-04-24 LAB — LIPID PANEL
Cholesterol: 136 mg/dL (ref 125–200)
HDL: 27 mg/dL — ABNORMAL LOW (ref >=40)
Total CHOL/HDL Ratio: 5 Ratio (ref <=5.0)
Triglycerides: 556 mg/dL — ABNORMAL HIGH (ref <150)

## 2015-04-25 LAB — RPR

## 2015-04-26 ENCOUNTER — Other Ambulatory Visit: Payer: Self-pay | Admitting: Endocrinology

## 2015-04-26 LAB — T-HELPER CELL (CD4) - (RCID CLINIC ONLY)
CD4 % Helper T Cell: 19 % — ABNORMAL LOW (ref 33–55)
CD4 T Cell Abs: 1020 /uL (ref 400–2700)

## 2015-04-26 LAB — HIV-1 RNA QUANT-NO REFLEX-BLD
HIV 1 RNA Quant: <20 copies/mL (ref <20)
HIV-1 RNA Quant, Log: <1.3 Log copies/mL (ref <1.30)

## 2015-04-26 LAB — URINE CYTOLOGY ANCILLARY ONLY
Chlamydia: NEGATIVE
Neisseria Gonorrhea: NEGATIVE

## 2015-04-26 MED ORDER — SITAGLIPTIN PHOSPHATE 100 MG PO TABS: 100.0000 mg | ORAL_TABLET | Freq: Every day | ORAL | Status: DC

## 2015-04-26 MED ORDER — CANAGLIFLOZIN 300 MG PO TABS: 300.0000 mg | ORAL_TABLET | Freq: Every day | ORAL | Status: DC

## 2015-05-02 ENCOUNTER — Other Ambulatory Visit: Payer: Self-pay | Admitting: Endocrinology

## 2015-05-08 ENCOUNTER — Encounter: Payer: Self-pay | Admitting: Endocrinology

## 2015-05-08 ENCOUNTER — Encounter: Payer: Self-pay | Admitting: Infectious Diseases

## 2015-05-08 ENCOUNTER — Ambulatory Visit (INDEPENDENT_AMBULATORY_CARE_PROVIDER_SITE_OTHER): Payer: PRIVATE HEALTH INSURANCE | Admitting: Endocrinology

## 2015-05-08 ENCOUNTER — Ambulatory Visit (INDEPENDENT_AMBULATORY_CARE_PROVIDER_SITE_OTHER): Payer: PRIVATE HEALTH INSURANCE | Admitting: Infectious Diseases

## 2015-05-08 VITALS — BP 122/64 | HR 88 | Temp 98.3°F | Ht 68.0 in | Wt 213.0 lb

## 2015-05-08 VITALS — BP 137/81 | HR 90 | Temp 97.8°F | Ht 69.0 in | Wt 214.0 lb

## 2015-05-08 DIAGNOSIS — B20 Human immunodeficiency virus [HIV] disease: Secondary | ICD-10-CM

## 2015-05-08 DIAGNOSIS — E118 Type 2 diabetes mellitus with unspecified complications: Secondary | ICD-10-CM

## 2015-05-08 MED ORDER — EMTRICITAB-RILPIVIR-TENOFOV AF 200-25-25 MG PO TABS: 1.0000 | ORAL_TABLET | Freq: Every day | ORAL | Status: DC

## 2015-05-08 MED ORDER — SILDENAFIL CITRATE 100 MG PO TABS: 100.0000 mg | ORAL_TABLET | Freq: Every day | ORAL | Status: DC | PRN

## 2015-05-08 NOTE — Progress Notes (Signed)
Subjective:    Patient ID: [name], male    DOB: 09-03-1950, 65 y.o.   MRN: [id number]  HPI pt returns for f/u of idiopathic secondary hypogonadism (dx'ed early 2015; pituitary MRI was normal; he does not want fertility).  Since on the increased dosage of clomid, he feels better in general.   Pt returns for f/u of diabetes mellitus: DM type: 2 Dx'ed: [id number] Complications: none Therapy: 3 oral meds DKA: never Severe hypoglycemia: never Pancreatitis: never Other: he has never been on insulin Interval history: no cbg record, but states cbg's are well-controlled.  he denies hypoglycemia.  He has ED sxs.  Past Medical History  Diagnosis Date  . HIV disease ([place]) 1990    [facility]  . Hypertension   . HEMOCHROMATOSIS, HX OF 12/15/2005    phlebotomy every 6 to 8 weeks--[facility]  . GOUT 06/22/2006  . GILBERT'S SYNDROME 02/25/2007  . DIABETES MELLITUS, TYPE II, UNCONTROLLED 06/22/2006  . Diabetes mellitus     on oral meds-no insulin  . HNP (herniated nucleus pulposus)     LUMBAR PAIN WITH PAIN DOWN LEFT LEG TO TOES-SOME NUMBNESS AND TINGLING IN BIG TOE  . Complication of anesthesia     ANESTHESIA AWARENESS  DURING ADRENALECTOMY AND COLONOSCOPY    Past Surgical History  Procedure Laterality Date  . Cholecystectomy    . Adrenalectomy      RIGHT ADRENALECTOMY   . Orif left shoulder      STILL HAS HARDWARE IN THE SHOULDER  . Lumbar laminectomy/decompression microdiscectomy  04/23/2011    Procedure: LUMBAR LAMINECTOMY/DECOMPRESSION MICRODISCECTOMY;  Surgeon: Johnn Hai, MD;  Location: [facility];  Service: Orthopedics;  Laterality: N/A;  Decompression L5 S1 (X-Ray)    Social History   Social History  . Marital Status: Single    Spouse Name: N/A  . Number of Children: N/A  . Years of Education: N/A   Occupational History  . Not on file.   Social History Main Topics  . Smoking status: Never Smoker   .  Smokeless tobacco: Never Used  . Alcohol Use: No  . Drug Use: No  . Sexual Activity: No     Comment: pt. declined condoms   Other Topics Concern  . Not on file   Social History Narrative    Current Outpatient Prescriptions on File Prior to Visit  Medication Sig Dispense Refill  . allopurinol (ZYLOPRIM) 300 MG tablet Take 300 mg by mouth daily.     Marland Kitchen AMLODIPINE BESYLATE PO Take 10 mg by mouth daily before breakfast.     . canagliflozin (INVOKANA) 300 MG TABS tablet Take 1 tablet (300 mg total) by mouth daily. **PT NEEDS APPT WITH DR Dwyane Dee FOR FURTHER REFILLS** 30 tablet 0  . carvedilol (COREG) 25 MG tablet Take 25 mg by mouth daily.     . clomiPHENE (CLOMID) 50 MG tablet Take 1 tablet (50 mg total) by mouth daily. 30 tablet 11  . colchicine 0.6 MG tablet Take 0.6 mg by mouth as needed.     Marland Kitchen glucose blood (ONETOUCH VERIO) test strip 1 each by Other route daily. And lancets 1/day 100 each 12  . JANUVIA 100 MG tablet TAKE 1 TABLET BY MOUTH DAILY 30 tablet 0  . Lancets (ACCU-CHEK MULTICLIX) lancets 1 each by Other route as needed. Use as instructed     . metFORMIN (GLUCOPHAGE-XR) 500 MG 24 hr tablet Take 4 tablets by mouth  daily  360 tablet 2  . telmisartan (MICARDIS) 80 MG tablet Take 80 mg by mouth daily before breakfast.     . emtricitabine-rilpivir-tenofovir AF (ODEFSEY) 200-25-25 MG TABS tablet Take 1 tablet by mouth daily with breakfast. 90 tablet 3   No current facility-administered medications on file prior to visit.    Allergies  Allergen Reactions  . Codeine Itching  . Meperidine Hcl Itching  . Scopolamine     GIVEN WITH A SURGERY YRS AGO--CAUSED HALLUNCINATIONS    Family History  Problem Relation Age of Onset  . Hypertension Mother   . Hyperlipidemia Mother   . Atrial fibrillation Mother   . Hyperlipidemia Father   . Hypertension Father   . Atrial fibrillation Father     BP 122/64 mmHg  Pulse 88  Temp(Src) 98.3 F (36.8 C) (Oral)  Ht 5\' 8"  (1.727 m)  Wt 213  lb (96.616 kg)  BMI 32.39 kg/m2  SpO2 95%  Review of Systems Denies decreased urinary stream.     Objective:   Physical Exam VITAL SIGNS:  See vs page GENERAL: no distress Pulses: dorsalis pedis intact bilat.   MSK: no deformity of the feet CV: no leg edema Skin:  no ulcer on the feet.  normal color and temp on the feet. Neuro: sensation is intact to touch on the feet   PSA=0.2 A1c=6.5% Lab Results  Component Value Date   TESTOSTERONE 215* 05/08/2015      Assessment & Plan:  DM: well-controlled ED sxs, persistent Hypogonadism: inadeq response to clomid.    Patient is advised the following: Patient Instructions  blood tests are requested for you today.  We'll let you know about the results. Please continue the same medications.  i have sent a prescription to your pharmacy, for the ED symptoms.   Please come back for a follow-up appointment in 6 months.     addendum: pt is advised: Your options are to Please continue the same clomiphene, or to change to the testosterone cream or gel. Please let me know.
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
## 2015-05-08 NOTE — Assessment & Plan Note (Signed)
Will change his ART to odefsy. For bone and especially for renal protection- his Cr has come up.  Take with food.  Offered/refused condoms.  vax are up to date.  rtc in 4 months

## 2015-05-08 NOTE — Patient Instructions (Addendum)
blood tests are requested for you today.  We'll let you know about the results. Please continue the same medications.  i have sent a prescription to your pharmacy, for the ED symptoms.   Please come back for a follow-up appointment in 6 months.

## 2015-05-08 NOTE — Assessment & Plan Note (Signed)
He is doing well, again some concerns about his Cr.  Has renal f/u soon

## 2015-05-09 ENCOUNTER — Encounter: Payer: Self-pay | Admitting: Endocrinology

## 2015-05-09 LAB — TESTOSTERONE,FREE AND TOTAL
Testosterone, Free: 4.1 pg/mL — ABNORMAL LOW (ref 6.6–18.1)
Testosterone: 215 ng/dL — ABNORMAL LOW (ref 348–1197)

## 2015-05-09 MED ORDER — SILDENAFIL CITRATE 20 MG PO TABS: ORAL_TABLET | ORAL | Status: DC

## 2015-05-25 ENCOUNTER — Other Ambulatory Visit: Payer: Self-pay | Admitting: Endocrinology

## 2015-06-27 ENCOUNTER — Other Ambulatory Visit: Payer: Self-pay | Admitting: Endocrinology

## 2015-07-16 ENCOUNTER — Other Ambulatory Visit: Payer: Self-pay | Admitting: Endocrinology

## 2015-07-17 ENCOUNTER — Encounter: Payer: Self-pay | Admitting: Infectious Diseases

## 2015-07-18 ENCOUNTER — Other Ambulatory Visit: Payer: Self-pay | Admitting: *Deleted

## 2015-07-18 DIAGNOSIS — B20 Human immunodeficiency virus [HIV] disease: Secondary | ICD-10-CM

## 2015-07-18 MED ORDER — EMTRICITAB-RILPIVIR-TENOFOV AF 200-25-25 MG PO TABS
1.0000 | ORAL_TABLET | Freq: Every day | ORAL | Status: DC
Start: 2015-07-18 — End: 2016-06-25

## 2015-08-09 ENCOUNTER — Other Ambulatory Visit: Payer: Self-pay | Admitting: Endocrinology

## 2015-08-22 ENCOUNTER — Other Ambulatory Visit: Payer: PRIVATE HEALTH INSURANCE

## 2015-09-03 ENCOUNTER — Other Ambulatory Visit: Payer: Managed Care, Other (non HMO)

## 2015-09-03 DIAGNOSIS — B20 Human immunodeficiency virus [HIV] disease: Secondary | ICD-10-CM

## 2015-09-04 LAB — HIV-1 RNA QUANT-NO REFLEX-BLD
HIV 1 RNA Quant: <20 copies/mL (ref <20)
HIV-1 RNA Quant, Log: <1.3 Log copies/mL (ref <1.30)

## 2015-09-04 LAB — T-HELPER CELL (CD4) - (RCID CLINIC ONLY)
CD4 % Helper T Cell: 18 % — ABNORMAL LOW (ref 33–55)
CD4 T Cell Abs: 810 /uL (ref 400–2700)

## 2015-09-05 ENCOUNTER — Ambulatory Visit: Payer: PRIVATE HEALTH INSURANCE | Admitting: Infectious Diseases

## 2015-09-14 ENCOUNTER — Other Ambulatory Visit: Payer: Self-pay | Admitting: Endocrinology

## 2015-09-24 ENCOUNTER — Encounter: Payer: Self-pay | Admitting: Infectious Diseases

## 2015-09-24 ENCOUNTER — Ambulatory Visit (INDEPENDENT_AMBULATORY_CARE_PROVIDER_SITE_OTHER): Payer: Managed Care, Other (non HMO) | Admitting: Infectious Diseases

## 2015-09-24 VITALS — BP 119/75 | HR 85 | Temp 97.8°F | Wt 222.0 lb

## 2015-09-24 DIAGNOSIS — E118 Type 2 diabetes mellitus with unspecified complications: Secondary | ICD-10-CM

## 2015-09-24 DIAGNOSIS — Z113 Encounter for screening for infections with a predominantly sexual mode of transmission: Secondary | ICD-10-CM

## 2015-09-24 DIAGNOSIS — I10 Essential (primary) hypertension: Secondary | ICD-10-CM | POA: Diagnosis not present

## 2015-09-24 DIAGNOSIS — Z79899 Other long term (current) drug therapy: Secondary | ICD-10-CM

## 2015-09-24 DIAGNOSIS — B20 Human immunodeficiency virus [HIV] disease: Secondary | ICD-10-CM | POA: Diagnosis not present

## 2015-09-24 DIAGNOSIS — G4700 Insomnia, unspecified: Secondary | ICD-10-CM | POA: Insufficient documentation

## 2015-09-24 NOTE — Assessment & Plan Note (Signed)
He is doing well.  Offered/refused condoms.  His vax are up to date Scheduled for colon Will see him back in 6 months Will check his cr today

## 2015-09-24 NOTE — Assessment & Plan Note (Signed)
Appreciate renal f/u

## 2015-09-24 NOTE — Progress Notes (Signed)
   Subjective:    Patient ID: [name], male    DOB: [birth year], 65 y.o.   MRN: [id number]  HPI 65 yo M with hx of hemochromatosis, DM2 (dx 2007), secondary hypogonadism (on clomid), hyperlipidemia, lumbar laminectomy/decompression (04-2011), and HIV+ since 1990 when he was dx on insurance physical. He has been maintained on atripla --> changed to [facility] 2017.  At his previous visit his Cr was elevated to 1.27 and his Trig was 556.  His A1C has decreased to 6.0 (prev 6.5). His Cr has stayed up, he had his ARB stopped by renal recently.    HIV 1 RNA QUANT (copies/mL)  Date Value  09/03/2015 <20  04/24/2015 <20  05/17/2014 <20   CD4 T CELL ABS (/uL)  Date Value  09/03/2015 810  04/24/2015 1020  05/17/2014 740    Has been sleeping poorly (sleeps for 2-3 h only). When he awakens he reads, does not take naps. Perseverates when he wakes up. No ETOH. No noctuira.  Seeing new GI on 7-28 for colon.  ophtho last month.  No change in LE numbness. No sores.   Review of Systems  Constitutional: Negative for appetite change and unexpected weight change.  Gastrointestinal: Negative for diarrhea and constipation.  Genitourinary: Negative for difficulty urinating.       Objective:   Physical Exam  Constitutional: He appears well-developed and well-nourished.  HENT:  Mouth/Throat: No oropharyngeal exudate.  Eyes: EOM are normal. Pupils are equal, round, and reactive to light.  Neck: Neck supple.  Cardiovascular: Normal rate, regular rhythm and normal heart sounds.   Pulmonary/Chest: Effort normal and breath sounds normal.  Abdominal: Soft. Bowel sounds are normal. There is no tenderness. There is no rebound.  Musculoskeletal:  Trace LE edema  Lymphadenopathy:    He has no cervical adenopathy.       Assessment & Plan:

## 2015-09-24 NOTE — Assessment & Plan Note (Signed)
He is improved. Apprciate. pcp f/u.

## 2015-09-25 LAB — BASIC METABOLIC PANEL
BUN: 19 mg/dL (ref 7–25)
CO2: 18 mmol/L — ABNORMAL LOW (ref 20–31)
Calcium: 9.6 mg/dL (ref 8.6–10.3)
Chloride: 103 mmol/L (ref 98–110)
Creat: 1.3 mg/dL — ABNORMAL HIGH (ref 0.70–1.25)
Glucose, Bld: 186 mg/dL — ABNORMAL HIGH (ref 65–99)
Potassium: 4.6 mmol/L (ref 3.5–5.3)
Sodium: 140 mmol/L (ref 135–146)

## 2015-09-27 ENCOUNTER — Other Ambulatory Visit: Payer: Self-pay | Admitting: *Deleted

## 2015-09-27 MED ORDER — METFORMIN HCL ER 500 MG PO TB24: ORAL_TABLET | ORAL | Status: DC

## 2015-10-02 ENCOUNTER — Encounter: Payer: Self-pay | Admitting: Infectious Diseases

## 2015-10-15 ENCOUNTER — Other Ambulatory Visit: Payer: Self-pay

## 2015-10-15 ENCOUNTER — Other Ambulatory Visit: Payer: Self-pay | Admitting: Endocrinology

## 2015-10-15 ENCOUNTER — Other Ambulatory Visit: Payer: Self-pay | Admitting: Internal Medicine

## 2015-10-15 MED ORDER — METFORMIN HCL ER 500 MG PO TB24: ORAL_TABLET | ORAL | 2 refills | Status: DC

## 2015-11-06 ENCOUNTER — Ambulatory Visit: Payer: PRIVATE HEALTH INSURANCE | Admitting: Endocrinology

## 2015-11-21 ENCOUNTER — Ambulatory Visit: Payer: Managed Care, Other (non HMO) | Admitting: Endocrinology

## 2015-12-05 ENCOUNTER — Ambulatory Visit: Payer: Managed Care, Other (non HMO) | Admitting: Endocrinology

## 2015-12-21 ENCOUNTER — Other Ambulatory Visit: Payer: Self-pay | Admitting: Endocrinology

## 2015-12-21 NOTE — Telephone Encounter (Signed)
Please refill x 1 Ov is due  

## 2015-12-29 ENCOUNTER — Inpatient Hospital Stay (HOSPITAL_COMMUNITY)
Admission: EM | Admit: 2015-12-29 | Discharge: 2016-01-04 | DRG: 974 | Disposition: A | Discharge status: Home / Self Care | Payer: Managed Care, Other (non HMO) | Attending: Internal Medicine | Admitting: Internal Medicine

## 2015-12-29 ENCOUNTER — Emergency Department (HOSPITAL_COMMUNITY): Payer: Managed Care, Other (non HMO)

## 2015-12-29 ENCOUNTER — Encounter (HOSPITAL_COMMUNITY): Payer: Self-pay

## 2015-12-29 ENCOUNTER — Inpatient Hospital Stay (HOSPITAL_COMMUNITY): Payer: Managed Care, Other (non HMO)

## 2015-12-29 DIAGNOSIS — N390 Urinary tract infection, site not specified: Secondary | ICD-10-CM

## 2015-12-29 DIAGNOSIS — N10 Acute tubulo-interstitial nephritis: Secondary | ICD-10-CM | POA: Diagnosis not present

## 2015-12-29 DIAGNOSIS — Z87440 Personal history of urinary (tract) infections: Secondary | ICD-10-CM | POA: Diagnosis not present

## 2015-12-29 DIAGNOSIS — E781 Pure hyperglyceridemia: Secondary | ICD-10-CM | POA: Diagnosis present

## 2015-12-29 DIAGNOSIS — E784 Other hyperlipidemia: Secondary | ICD-10-CM

## 2015-12-29 DIAGNOSIS — Z8249 Family history of ischemic heart disease and other diseases of the circulatory system: Secondary | ICD-10-CM | POA: Diagnosis not present

## 2015-12-29 DIAGNOSIS — J9601 Acute respiratory failure with hypoxia: Secondary | ICD-10-CM | POA: Diagnosis present

## 2015-12-29 DIAGNOSIS — R0902 Hypoxemia: Secondary | ICD-10-CM | POA: Diagnosis present

## 2015-12-29 DIAGNOSIS — E872 Acidosis: Secondary | ICD-10-CM | POA: Diagnosis present

## 2015-12-29 DIAGNOSIS — N12 Tubulo-interstitial nephritis, not specified as acute or chronic: Secondary | ICD-10-CM | POA: Diagnosis present

## 2015-12-29 DIAGNOSIS — N183 Chronic kidney disease, stage 3 (moderate): Secondary | ICD-10-CM | POA: Diagnosis present

## 2015-12-29 DIAGNOSIS — M109 Gout, unspecified: Secondary | ICD-10-CM | POA: Diagnosis present

## 2015-12-29 DIAGNOSIS — Z7984 Long term (current) use of oral hypoglycemic drugs: Secondary | ICD-10-CM | POA: Diagnosis not present

## 2015-12-29 DIAGNOSIS — R0781 Pleurodynia: Secondary | ICD-10-CM | POA: Diagnosis not present

## 2015-12-29 DIAGNOSIS — B961 Klebsiella pneumoniae [K. pneumoniae] as the cause of diseases classified elsewhere: Secondary | ICD-10-CM | POA: Diagnosis present

## 2015-12-29 DIAGNOSIS — I5031 Acute diastolic (congestive) heart failure: Secondary | ICD-10-CM | POA: Diagnosis present

## 2015-12-29 DIAGNOSIS — I130 Hypertensive heart and chronic kidney disease with heart failure and stage 1 through stage 4 chronic kidney disease, or unspecified chronic kidney disease: Secondary | ICD-10-CM | POA: Diagnosis present

## 2015-12-29 DIAGNOSIS — N342 Other urethritis: Secondary | ICD-10-CM | POA: Diagnosis not present

## 2015-12-29 DIAGNOSIS — R071 Chest pain on breathing: Secondary | ICD-10-CM

## 2015-12-29 DIAGNOSIS — J811 Chronic pulmonary edema: Secondary | ICD-10-CM

## 2015-12-29 DIAGNOSIS — N529 Male erectile dysfunction, unspecified: Secondary | ICD-10-CM | POA: Diagnosis present

## 2015-12-29 DIAGNOSIS — R0603 Acute respiratory distress: Secondary | ICD-10-CM

## 2015-12-29 DIAGNOSIS — E1122 Type 2 diabetes mellitus with diabetic chronic kidney disease: Secondary | ICD-10-CM | POA: Diagnosis present

## 2015-12-29 DIAGNOSIS — E118 Type 2 diabetes mellitus with unspecified complications: Secondary | ICD-10-CM | POA: Diagnosis not present

## 2015-12-29 DIAGNOSIS — R0602 Shortness of breath: Secondary | ICD-10-CM

## 2015-12-29 DIAGNOSIS — I10 Essential (primary) hypertension: Secondary | ICD-10-CM | POA: Diagnosis present

## 2015-12-29 DIAGNOSIS — R7881 Bacteremia: Secondary | ICD-10-CM

## 2015-12-29 DIAGNOSIS — A419 Sepsis, unspecified organism: Secondary | ICD-10-CM | POA: Diagnosis present

## 2015-12-29 DIAGNOSIS — Z862 Personal history of diseases of the blood and blood-forming organs and certain disorders involving the immune mechanism: Secondary | ICD-10-CM

## 2015-12-29 DIAGNOSIS — B20 Human immunodeficiency virus [HIV] disease: Secondary | ICD-10-CM | POA: Diagnosis present

## 2015-12-29 DIAGNOSIS — J810 Acute pulmonary edema: Secondary | ICD-10-CM | POA: Diagnosis not present

## 2015-12-29 DIAGNOSIS — E291 Testicular hypofunction: Secondary | ICD-10-CM | POA: Diagnosis present

## 2015-12-29 DIAGNOSIS — J9600 Acute respiratory failure, unspecified whether with hypoxia or hypercapnia: Secondary | ICD-10-CM

## 2015-12-29 DIAGNOSIS — R109 Unspecified abdominal pain: Secondary | ICD-10-CM

## 2015-12-29 DIAGNOSIS — E785 Hyperlipidemia, unspecified: Secondary | ICD-10-CM | POA: Diagnosis present

## 2015-12-29 LAB — URINALYSIS, ROUTINE W REFLEX MICROSCOPIC
Bilirubin Urine: NEGATIVE
Glucose, UA: >1000 mg/dL — AB
Ketones, ur: NEGATIVE mg/dL
Nitrite: NEGATIVE
Protein, ur: 100 mg/dL — AB
Specific Gravity, Urine: 1.026 (ref 1.005–1.030)
pH: 6 (ref 5.0–8.0)

## 2015-12-29 LAB — CBC WITH DIFFERENTIAL/PLATELET
Basophils Absolute: 0 10*3/uL (ref 0.0–0.1)
Basophils Relative: 0 %
Eosinophils Absolute: 0 10*3/uL (ref 0.0–0.7)
Eosinophils Relative: 1 %
HCT: 34.4 % — ABNORMAL LOW (ref 39.0–52.0)
Hemoglobin: 11.9 g/dL — ABNORMAL LOW (ref 13.0–17.0)
Lymphocytes Relative: 8 %
Lymphs Abs: 0.6 10*3/uL — ABNORMAL LOW (ref 0.7–4.0)
MCH: 34 pg (ref 26.0–34.0)
MCHC: 34.6 g/dL (ref 30.0–36.0)
MCV: 98.3 fL (ref 78.0–100.0)
Monocytes Absolute: 0.5 10*3/uL (ref 0.1–1.0)
Monocytes Relative: 8 %
Neutro Abs: 5.7 10*3/uL (ref 1.7–7.7)
Neutrophils Relative %: 83 %
Platelets: 138 10*3/uL — ABNORMAL LOW (ref 150–400)
RBC: 3.5 MIL/uL — ABNORMAL LOW (ref 4.22–5.81)
RDW: 14.9 % (ref 11.5–15.5)
WBC: 6.8 10*3/uL (ref 4.0–10.5)

## 2015-12-29 LAB — URINE MICROSCOPIC-ADD ON: Squamous Epithelial / LPF: NONE SEEN

## 2015-12-29 LAB — COMPREHENSIVE METABOLIC PANEL
ALT: 14 U/L — ABNORMAL LOW (ref 17–63)
AST: 35 U/L (ref 15–41)
Albumin: 2.8 g/dL — ABNORMAL LOW (ref 3.5–5.0)
Alkaline Phosphatase: 123 U/L (ref 38–126)
Anion gap: 10 (ref 5–15)
BUN: 21 mg/dL — ABNORMAL HIGH (ref 6–20)
CO2: 18 mmol/L — ABNORMAL LOW (ref 22–32)
Calcium: 8.2 mg/dL — ABNORMAL LOW (ref 8.9–10.3)
Chloride: 105 mmol/L (ref 101–111)
Creatinine, Ser: 1.38 mg/dL — ABNORMAL HIGH (ref 0.61–1.24)
GFR calc Af Amer: >60 mL/min (ref >60)
GFR calc non Af Amer: 53 mL/min — ABNORMAL LOW (ref >60)
Glucose, Bld: 273 mg/dL — ABNORMAL HIGH (ref 65–99)
Potassium: 4.1 mmol/L (ref 3.5–5.1)
Sodium: 133 mmol/L — ABNORMAL LOW (ref 135–145)
Total Bilirubin: 2.9 mg/dL — ABNORMAL HIGH (ref 0.3–1.2)
Total Protein: 6.5 g/dL (ref 6.5–8.1)

## 2015-12-29 LAB — GLUCOSE, CAPILLARY
Glucose-Capillary: 286 mg/dL — ABNORMAL HIGH (ref 65–99)
Glucose-Capillary: 293 mg/dL — ABNORMAL HIGH (ref 65–99)
Glucose-Capillary: 242 mg/dL — ABNORMAL HIGH (ref 65–99)

## 2015-12-29 LAB — BRAIN NATRIURETIC PEPTIDE: B Natriuretic Peptide: 211.4 pg/mL — ABNORMAL HIGH (ref 0.0–100.0)

## 2015-12-29 LAB — I-STAT CG4 LACTIC ACID, ED: Lactic Acid, Venous: 1.86 mmol/L (ref 0.5–1.9)

## 2015-12-29 MED ORDER — ICOSAPENT ETHYL 1 G PO CAPS: 1.0000 g | ORAL_CAPSULE | Freq: Two times a day (BID) | ORAL | Status: DC

## 2015-12-29 MED ORDER — ACETAMINOPHEN 650 MG RE SUPP: 650.0000 mg | Freq: Four times a day (QID) | RECTAL | Status: DC | PRN

## 2015-12-29 MED ORDER — ONDANSETRON HCL 4 MG/2ML IJ SOLN: 4.0000 mg | Freq: Three times a day (TID) | INTRAMUSCULAR | Status: DC | PRN

## 2015-12-29 MED ORDER — IOPAMIDOL (ISOVUE-370) INJECTION 76%: 100.0000 mL | Freq: Once | INTRAVENOUS | Status: DC | PRN

## 2015-12-29 MED ORDER — AMLODIPINE BESYLATE 5 MG PO TABS
10.0000 mg | ORAL_TABLET | Freq: Every day | ORAL | Status: DC
  Administered 2015-12-29 – 2016-01-04 (×7): 10 mg via ORAL
  Filled 2015-12-29 (×7): qty 2

## 2015-12-29 MED ORDER — IRBESARTAN 150 MG PO TABS
300.0000 mg | ORAL_TABLET | Freq: Every day | ORAL | Status: DC
  Administered 2015-12-29 – 2016-01-04 (×7): 300 mg via ORAL
  Filled 2015-12-29 (×2): qty 1
  Filled 2015-12-29 (×5): qty 2
  Filled 2015-12-29 (×2): qty 1
  Filled 2015-12-29: qty 2
  Filled 2015-12-29: qty 1
  Filled 2015-12-29: qty 2

## 2015-12-29 MED ORDER — INSULIN ASPART 100 UNIT/ML ~~LOC~~ SOLN
0.0000 [IU] | Freq: Every day | SUBCUTANEOUS | Status: DC
  Administered 2015-12-29: 2 [IU] via SUBCUTANEOUS
  Administered 2015-12-30: 4 [IU] via SUBCUTANEOUS
  Administered 2015-12-31: 2 [IU] via SUBCUTANEOUS
  Administered 2016-01-02: 0 [IU] via SUBCUTANEOUS
  Administered 2016-01-03: 2 [IU] via SUBCUTANEOUS

## 2015-12-29 MED ORDER — ONDANSETRON HCL 4 MG PO TABS
4.0000 mg | ORAL_TABLET | Freq: Four times a day (QID) | ORAL | Status: DC | PRN
  Administered 2015-12-30: 4 mg via ORAL
  Filled 2015-12-29: qty 1

## 2015-12-29 MED ORDER — DEXTROSE 5 % IV SOLN
1.0000 g | INTRAVENOUS | Status: DC
  Administered 2015-12-29: 1 g via INTRAVENOUS
  Filled 2015-12-29 (×2): qty 10

## 2015-12-29 MED ORDER — CLOMIPHENE CITRATE 50 MG PO TABS: 50.0000 mg | ORAL_TABLET | Freq: Every day | ORAL | Status: DC

## 2015-12-29 MED ORDER — COLCHICINE 0.6 MG PO TABS
0.6000 mg | ORAL_TABLET | Freq: Every day | ORAL | Status: DC | PRN
  Filled 2015-12-29: qty 1

## 2015-12-29 MED ORDER — EMTRICITAB-RILPIVIR-TENOFOV AF 200-25-25 MG PO TABS
1.0000 | ORAL_TABLET | Freq: Every day | ORAL | Status: DC
  Administered 2015-12-30 – 2016-01-04 (×6): 1 via ORAL
  Filled 2015-12-29 (×6): qty 1

## 2015-12-29 MED ORDER — ALLOPURINOL 300 MG PO TABS
300.0000 mg | ORAL_TABLET | Freq: Every day | ORAL | Status: DC
  Administered 2015-12-29 – 2016-01-04 (×7): 300 mg via ORAL
  Filled 2015-12-29 (×7): qty 1

## 2015-12-29 MED ORDER — INSULIN ASPART 100 UNIT/ML ~~LOC~~ SOLN
0.0000 [IU] | Freq: Three times a day (TID) | SUBCUTANEOUS | Status: DC
  Administered 2015-12-29 (×2): 8 [IU] via SUBCUTANEOUS
  Administered 2015-12-30: 5 [IU] via SUBCUTANEOUS
  Administered 2015-12-30: 11 [IU] via SUBCUTANEOUS
  Administered 2015-12-30: 5 [IU] via SUBCUTANEOUS
  Administered 2015-12-31: 8 [IU] via SUBCUTANEOUS
  Administered 2015-12-31: 3 [IU] via SUBCUTANEOUS
  Administered 2015-12-31: 15 [IU] via SUBCUTANEOUS
  Administered 2016-01-01: 5 [IU] via SUBCUTANEOUS
  Administered 2016-01-01: 3 [IU] via SUBCUTANEOUS
  Administered 2016-01-01: 8 [IU] via SUBCUTANEOUS
  Administered 2016-01-02: 5 [IU] via SUBCUTANEOUS
  Administered 2016-01-02: 3 [IU] via SUBCUTANEOUS
  Administered 2016-01-03: 5 [IU] via SUBCUTANEOUS
  Administered 2016-01-03: 2 [IU] via SUBCUTANEOUS
  Administered 2016-01-03 – 2016-01-04 (×2): 3 [IU] via SUBCUTANEOUS
  Administered 2016-01-04: 5 [IU] via SUBCUTANEOUS

## 2015-12-29 MED ORDER — ACETAMINOPHEN 325 MG PO TABS: 650.0000 mg | ORAL_TABLET | Freq: Four times a day (QID) | ORAL | Status: DC | PRN

## 2015-12-29 MED ORDER — ONDANSETRON HCL 4 MG/2ML IJ SOLN: 4.0000 mg | Freq: Four times a day (QID) | INTRAMUSCULAR | Status: DC | PRN

## 2015-12-29 MED ORDER — SODIUM CHLORIDE 0.9 % IV BOLUS (SEPSIS)
2000.0000 mL | Freq: Once | INTRAVENOUS | Status: AC
  Administered 2015-12-29: 2000 mL via INTRAVENOUS

## 2015-12-29 MED ORDER — ENOXAPARIN SODIUM 40 MG/0.4ML ~~LOC~~ SOLN
40.0000 mg | Freq: Every day | SUBCUTANEOUS | Status: DC
  Administered 2015-12-29 – 2016-01-04 (×7): 40 mg via SUBCUTANEOUS
  Filled 2015-12-29 (×7): qty 0.4

## 2015-12-29 MED ORDER — CARVEDILOL 25 MG PO TABS
25.0000 mg | ORAL_TABLET | Freq: Every day | ORAL | Status: DC
  Administered 2015-12-29 – 2016-01-04 (×7): 25 mg via ORAL
  Filled 2015-12-29 (×7): qty 1

## 2015-12-29 MED ORDER — INSULIN ASPART 100 UNIT/ML ~~LOC~~ SOLN: 0.0000 [IU] | Freq: Three times a day (TID) | SUBCUTANEOUS | Status: DC

## 2015-12-29 MED ORDER — SODIUM CHLORIDE 0.9 % IV SOLN: INTRAVENOUS | Status: DC

## 2015-12-29 NOTE — Progress Notes (Signed)
Patient from ED. Alert and oriented x 3. Oriented to room and environment. Denies pain. Vital signs obtained. Discussed due meds with the patient, verbalized understanding. Will continue to monitor.

## 2015-12-29 NOTE — Progress Notes (Signed)
Per patient he had voided 2x this afternnoon with sufficient amount. Encouraged to use the urinal  so that we can measure  The urine output,verbalized understanding. Will continue to monitor.

## 2015-12-29 NOTE — ED Notes (Signed)
Bed: WA25 Expected date:  Expected time:  Means of arrival:  Comments: ems  

## 2015-12-29 NOTE — H&P (Addendum)
History and Physical    [name] [id number] DOB: [birth year] DOA: 12/29/2015    PCP: [name], MD  Patient coming from: home  Chief Complaint: Chills  HPI: [name] is a 65 y.o. male with medical history significant of NIRDM 2, Gilbert's syndrome, Hemochromatosis, HIV (for 28 yrs), HTN, idiopathic secondary hypogonadism (Dx 2015), gout, ED. She was treated for a urinary tract infection with ciprofloxacin for 1 week 3 weeks ago. This past Tuesday he began to feel urgency and frequency again and was prescribed Cipro which she started Tuesday night. He has been having on and off chills since then. Temperature 102. He felt fine yesterday and had blood work done at his PCPs office but woke up this morning with chills and shortness of breath and therefore came into the ER. He has had mild left flank pain but he thinks this is because of laying in bed and currently has resolved without receiving pain medication. He's had no cough or sore throat runny nose earache or other respiratory symptoms. He has been feeling lightheaded at times this past week as well. He is feeling much better after receiving IV fluids in the ER.  ED Course:  Heart rate 120s on arrival, respiratory rate in 30s NA+133, CO2 18 BUN 21 creatinine 1.38, albumin 2.8, total bili 2.9, BNP 211.4, hemoglobin 11.9, platelets 138 UA shows greater than thousand glucose, granular casts, rare bacteria, trace leukocytes, 100 protein, negative nitrites, specific gravity 1.026, 6-30 WBCs, no squamous cells -2 view chest x-ray shows pulmonary vascular congestion noted more centrally, subsegmental atelectasis in the right mid and lower lung zone and cardiomegaly  Review of Systems:  Poor appetite Diabetes well controlled with a hemoglobin A1c of 6.0 about a month ago Chronically loose stools- no change and no new GI symptoms All other systems reviewed and apart from HPI, are negative.  Past Medical History:  Diagnosis Date    . Complication of anesthesia    ANESTHESIA AWARENESS  DURING ADRENALECTOMY AND COLONOSCOPY  . Diabetes mellitus    on oral meds-no insulin  . DIABETES MELLITUS, TYPE II, UNCONTROLLED 06/22/2006  . GILBERT'S SYNDROME 02/25/2007  . GOUT 06/22/2006  . HEMOCHROMATOSIS, HX OF 12/15/2005   phlebotomy every 6 to 8 weeks--[facility]  . HIV disease ([place]) 1990   [facility] [place] [facility]  . HNP (herniated nucleus pulposus)    LUMBAR PAIN WITH PAIN DOWN LEFT LEG TO TOES-SOME NUMBNESS AND TINGLING IN BIG TOE  . Hypertension     Past Surgical History:  Procedure Laterality Date  . ADRENALECTOMY     RIGHT ADRENALECTOMY   . CHOLECYSTECTOMY    . LUMBAR LAMINECTOMY/DECOMPRESSION MICRODISCECTOMY  04/23/2011   Procedure: LUMBAR LAMINECTOMY/DECOMPRESSION MICRODISCECTOMY;  Surgeon: Johnn Hai, MD;  Location: [facility];  Service: Orthopedics;  Laterality: N/A;  Decompression L5 S1 (X-Ray)  . ORIF LEFT SHOULDER     STILL HAS HARDWARE IN THE SHOULDER    Social History:   reports that he has never smoked. He has never used smokeless tobacco. He reports that he does not drink alcohol or use drugs.  Allergies  Allergen Reactions  . Codeine Itching  . Meperidine Hcl Itching  . Scopolamine     GIVEN WITH A SURGERY YRS AGO--CAUSED HALLUNCINATIONS    Family History  Problem Relation Age of Onset  . Hypertension Mother   . Hyperlipidemia Mother   . Atrial fibrillation Mother   . Hyperlipidemia Father   . Hypertension  Father   . Atrial fibrillation Father      Prior to Admission medications   Medication Sig Start Date End Date Taking? Authorizing Provider  allopurinol (ZYLOPRIM) 300 MG tablet Take 300 mg by mouth daily.    Yes Historical Provider, MD  amLODipine (NORVASC) 10 MG tablet Take 10 mg by mouth daily.   Yes Historical Provider, MD  carvedilol (COREG) 25 MG tablet Take 25 mg by mouth daily.    Yes Historical Provider, MD  clomiPHENE (CLOMID)  50 MG tablet Take 1 tablet (50 mg total) by mouth daily. 09/14/14  Yes Renato Shin, MD  colchicine 0.6 MG tablet Take 0.6 mg by mouth daily as needed (gout).    Yes Historical Provider, MD  emtricitabine-rilpivir-tenofovir AF (ODEFSEY) 200-25-25 MG TABS tablet Take 1 tablet by mouth daily with breakfast. 07/18/15  Yes Campbell Riches, MD  glucose blood (ONETOUCH VERIO) test strip 1 each by Other route daily. And lancets 1/day 06/14/14  Yes Renato Shin, MD  Icosapent Ethyl (VASCEPA PO) Take 1 tablet by mouth daily.    Yes Historical Provider, MD  JANUVIA 100 MG tablet TAKE 1 TABLET(100 MG) BY MOUTH DAILY 06/27/15  Yes Renato Shin, MD  metFORMIN (GLUCOPHAGE-XR) 500 MG 24 hr tablet Take 4 tablets by mouth  daily Patient taking differently: Take 1,000 mg by mouth 2 (two) times daily.  10/15/15  Yes Renato Shin, MD  telmisartan (MICARDIS) 80 MG tablet Take 80 mg by mouth daily before breakfast.    Yes Historical Provider, MD  INVOKANA 300 MG TABS tablet TAKE 1 TABLET(300 MG) BY MOUTH DAILY Patient not taking: Reported on 12/29/2015 10/15/15   Renato Shin, MD  JANUVIA 100 MG tablet TAKE 1 TABLET(100 MG) BY MOUTH DAILY Patient not taking: Reported on 12/29/2015 08/09/15   Renato Shin, MD  JANUVIA 100 MG tablet TAKE 1 TABLET(100 MG) BY MOUTH DAILY Patient not taking: Reported on 12/29/2015 09/14/15   Renato Shin, MD  JANUVIA 100 MG tablet TAKE 1 TABLET(100 MG) BY MOUTH DAILY Patient not taking: Reported on 12/29/2015 10/15/15   Renato Shin, MD  JANUVIA 100 MG tablet TAKE 1 TABLET(100 MG) BY MOUTH DAILY Patient not taking: Reported on 12/29/2015 12/21/15   Renato Shin, MD  JANUVIA 100 MG tablet TAKE 1 TABLET(100 MG) BY MOUTH DAILY Patient not taking: Reported on 12/29/2015 12/21/15   Renato Shin, MD  sildenafil (REVATIO) 20 MG tablet 1-5 pills, as needed for ED symptoms. Patient not taking: Reported on 09/24/2015 05/09/15   Renato Shin, MD    Physical Exam: Vitals:   12/29/15 0656 12/29/15 0730 12/29/15  1040 12/29/15 1100  BP: 168/75 138/72 130/70 121/71  Pulse: (!) 123 (!) 122 108 108  Resp: (!) 32 (!) 39 (!) 32 (!) 28  Temp: 99.2 F (37.3 C)     TempSrc: Oral     SpO2: 90%  93%   Weight: 93 kg (205 lb)     Height: 5\' 9"  (1.753 m)         Constitutional: NAD, calm, comfortable Eyes: PERTLA, lids and conjunctivae normal ENMT: Mucous membranes are moist. Posterior pharynx clear of any exudate or lesions. Normal dentition.  Neck: normal, supple, no masses, no thyromegaly Respiratory: Faint crackles at bilateral lung bases, no wheezing, no crackles. Normal respiratory effort. No accessory muscle use. Oxygen saturation 93% on room air checked by myself Cardiovascular: S1 & S2 heard, regular rate and rhythm, no murmurs / rubs / gallops. No extremity edema. 2+ pedal pulses. No carotid  bruits.  Abdomen: No distension, no tenderness, no masses palpated. No hepatosplenomegaly. Bowel sounds normal.  Musculoskeletal: no clubbing / cyanosis. No joint deformity upper and lower extremities. Good ROM, no contractures. Normal muscle tone.  Skin: no rashes, lesions, ulcers. No induration Neurologic: CN 2-12 grossly intact. Sensation intact, DTR normal. Strength 5/5 in all 4 limbs.  Psychiatric: Normal judgment and insight. Alert and oriented x 3. Normal mood.     Labs on Admission: I have personally reviewed following labs and imaging studies  CBC:  Recent Labs Lab 12/29/15 0755  WBC 6.8  NEUTROABS 5.7  HGB 11.9*  HCT 34.4*  MCV 98.3  PLT 0000000*   Basic Metabolic Panel:  Recent Labs Lab 12/29/15 0955  NA 133*  K 4.1  CL 105  CO2 18*  GLUCOSE 273*  BUN 21*  CREATININE 1.38*  CALCIUM 8.2*   GFR: Estimated Creatinine Clearance: 61.7 mL/min (by C-G formula based on SCr of 1.38 mg/dL (H)). Liver Function Tests:  Recent Labs Lab 12/29/15 0955  AST 35  ALT 14*  ALKPHOS 123  BILITOT 2.9*  PROT 6.5  ALBUMIN 2.8*   No results for input(s): LIPASE, AMYLASE in the last 168  hours. No results for input(s): AMMONIA in the last 168 hours. Coagulation Profile: No results for input(s): INR, PROTIME in the last 168 hours. Cardiac Enzymes: No results for input(s): CKTOTAL, CKMB, CKMBINDEX, TROPONINI in the last 168 hours. BNP (last 3 results) No results for input(s): PROBNP in the last 8760 hours. HbA1C: No results for input(s): HGBA1C in the last 72 hours. CBG: No results for input(s): GLUCAP in the last 168 hours. Lipid Profile: No results for input(s): CHOL, HDL, LDLCALC, TRIG, CHOLHDL, LDLDIRECT in the last 72 hours. Thyroid Function Tests: No results for input(s): TSH, T4TOTAL, FREET4, T3FREE, THYROIDAB in the last 72 hours. Anemia Panel: No results for input(s): VITAMINB12, FOLATE, FERRITIN, TIBC, IRON, RETICCTPCT in the last 72 hours. Urine analysis:    Component Value Date/Time   COLORURINE AMBER (A) 12/29/2015 0824   APPEARANCEUR CLOUDY (A) 12/29/2015 0824   LABSPEC 1.026 12/29/2015 0824   PHURINE 6.0 12/29/2015 0824   GLUCOSEU >1000 (A) 12/29/2015 0824   GLUCOSEU NEG mg/dL 08/24/2006 1916   HGBUR MODERATE (A) 12/29/2015 0824   HGBUR negative 01/04/2007 0918   BILIRUBINUR NEGATIVE 12/29/2015 0824   KETONESUR NEGATIVE 12/29/2015 0824   PROTEINUR 100 (A) 12/29/2015 0824   UROBILINOGEN 0.2 04/18/2011 1404   NITRITE NEGATIVE 12/29/2015 0824   LEUKOCYTESUR TRACE (A) 12/29/2015 0824   Sepsis Labs: @LABRCNTIP (procalcitonin:4,lacticidven:4) )No results found for this or any previous visit (from the past 240 hour(s)).   Radiological Exams on Admission: Dg Chest 2 View  Result Date: 12/29/2015 CLINICAL DATA:  65 year male with shortness of breath. Diabetes and hypertension. Initial encounter. EXAM: CHEST  2 VIEW COMPARISON:  04/18/2011. FINDINGS: Chronically elevated right hemidiaphragm. Pulmonary vascular prominence most notable centrally. Subsegmental atelectatic changes suspected peripheral aspect right mid to lower lung zone. This can be  assessed on follow-up. No plain film evidence pulmonary malignancy. Mild cardiomegaly. Slightly tortuous aorta. Right shoulder joint degenerative changes. IMPRESSION: Pulmonary vascular congestion most notable centrally. Subsegmental atelectatic changes suspected peripheral aspect right mid to lower lung zone. This can be assessed on follow-up. Cardiomegaly. Electronically Signed   By: Genia Del M.D.   On: 12/29/2015 07:54    EKG: Independently reviewed. This tachycardia at 123 bpm  Assessment/Plan Principal Problem:   Sepsis -Suspect due to UTI ? pyelonephritis-failing outpatient treatment with  Cipro -Start Rocephin-cultures is obtained  Active Problems:   Pulmonary edema -Noted on chest x-ray-surprisingly states his shortness of breath has improved after IV fluids and tells me he only gets it when he has his episodes of chills -no history of heart failure- last ECHO in 2010 showed mild LVH - BNP elevated at 211 -Hold off on giving further fluids and obtain a 2-D echo -Oxygen saturation is currently 93% on room air and he is not in any distress    Human immunodeficiency virus (HIV) disease  -Resume home meds-last CD4 count was 810 in June 2017    Diabetes mellitus type 2 with complications -Hold Januvia and Glucophage and place on insulin sliding scale  CKD3 - follows with Dr Azzie Roup - Cr just about the same as on 7/17when it was 1.30  Mild acidosis - lactic acid is normal - CO2 was 18 on 7/17 -Follow  Gilbert's syndrome    Essential hypertension -Resume amlodipine, ARB and carvedilol withholding parameters    HEMOCHROMATOSIS, HX OF  Hypertriglyceridemia - He takes Icosapent BID - will resume    Hypogonadism male -Resume clomiphene  Gout -Some colchicine and allopurinol  Erectile dysfunction    DVT prophylaxis: Lovenox Code Status: Full Code  Family Communication:   Disposition Plan: Admit to [place] called: None  Admission status: inpatient     [facility] MD [facility] Pager: [email or web address] Password [email or web address] 7PM-7AM, please contact night-coverage   12/29/2015, 11:59 AM

## 2015-12-29 NOTE — ED Provider Notes (Signed)
[facility] DEPT Provider Note   CSN: [id number] Arrival date & time: 12/29/15  [id number]     History   Chief Complaint Chief Complaint  Patient presents with  . Shortness of Breath    HPI [name] is a 65 y.o. male.  HPI Pt is from home and was brought to the ED via EMS, c/o of sob and dizziness x 1 day . Pt was recently treated for a UTI and is currently on Abx.  Pt denies pain or burning with urination as per EMS. EMS reports that pt was + for orthostatic with a sitting BP of 160/89 and a standing BP of 130/77. Pt reports that he has not been taking medication for diabetes as he has not been eating EMS reports CBG 215. No vomiting, 1 incidence of diarrhea and + urinary frequencies, and urgency.  Past Medical History:  Diagnosis Date  . Complication of anesthesia    ANESTHESIA AWARENESS  DURING ADRENALECTOMY AND COLONOSCOPY  . Diabetes mellitus    on oral meds-no insulin  . DIABETES MELLITUS, TYPE II, UNCONTROLLED 06/22/2006  . GILBERT'S SYNDROME 02/25/2007  . GOUT 06/22/2006  . HEMOCHROMATOSIS, HX OF 12/15/2005   phlebotomy every 6 to 8 weeks--at [facility]  . HIV disease ([place]) 1990   [facility] [place]  . HNP (herniated nucleus pulposus)    LUMBAR PAIN WITH PAIN DOWN LEFT LEG TO TOES-SOME NUMBNESS AND TINGLING IN BIG TOE  . Hypertension     Patient Active Problem List   Diagnosis Date Noted  . Hypoxia 12/29/2015  . Insomnia 09/24/2015  . Encounter for long-term (current) use of other medications 05/24/2013  . Pituitary insufficiency ([place]) 05/14/2013  . Hypogonadism male 05/13/2013  . Other malaise and fatigue 05/11/2013  . Metatarsal deformity 02/16/2013  . Onychomycosis 02/16/2013  . HAV (hallux abducto valgus) 02/16/2013  . Bunion 02/16/2013  . Hyperlipidemia 02/05/2011  . 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 Diabetes mellitus type 2 with complications ([place]) [id number]  . GOUT 06/22/2006  . Human  immunodeficiency virus (HIV) disease ([place]) 12/15/2005  . Essential hypertension 12/15/2005  . HEMOCHROMATOSIS, HX OF 12/15/2005    Past Surgical History:  Procedure Laterality Date  . ADRENALECTOMY     RIGHT ADRENALECTOMY   . CHOLECYSTECTOMY    . LUMBAR LAMINECTOMY/DECOMPRESSION MICRODISCECTOMY  04/23/2011   Procedure: LUMBAR LAMINECTOMY/DECOMPRESSION MICRODISCECTOMY;  Surgeon: Johnn Hai, MD;  Location: [facility];  Service: Orthopedics;  Laterality: N/A;  Decompression L5 S1 (X-Ray)  . ORIF LEFT SHOULDER     STILL HAS HARDWARE IN THE SHOULDER       Home Medications    Prior to Admission medications   Medication Sig Start Date End Date Taking? Authorizing Provider  allopurinol (ZYLOPRIM) 300 MG tablet Take 300 mg by mouth daily.    Yes Historical Provider, MD  amLODipine (NORVASC) 10 MG tablet Take 10 mg by mouth daily.   Yes Historical Provider, MD  carvedilol (COREG) 25 MG tablet Take 25 mg by mouth daily.    Yes Historical Provider, MD  clomiPHENE (CLOMID) 50 MG tablet Take 1 tablet (50 mg total) by mouth daily. 09/14/14  Yes Renato Shin, MD  colchicine 0.6 MG tablet Take 0.6 mg by mouth daily as needed (gout).    Yes Historical Provider, MD  emtricitabine-rilpivir-tenofovir AF (ODEFSEY) 200-25-25 MG TABS tablet Take 1 tablet by mouth daily with breakfast. 07/18/15  Yes Campbell Riches, MD  glucose blood (ONETOUCH VERIO) test strip  1 each by Other route daily. And lancets 1/day 06/14/14  Yes Renato Shin, MD  Icosapent Ethyl (VASCEPA PO) Take 1 tablet by mouth daily.    Yes Historical Provider, MD  JANUVIA 100 MG tablet TAKE 1 TABLET(100 MG) BY MOUTH DAILY 06/27/15  Yes Renato Shin, MD  metFORMIN (GLUCOPHAGE-XR) 500 MG 24 hr tablet Take 4 tablets by mouth  daily Patient taking differently: Take 1,000 mg by mouth 2 (two) times daily.  10/15/15  Yes Renato Shin, MD  telmisartan (MICARDIS) 80 MG tablet Take 80 mg by mouth daily before breakfast.    Yes Historical Provider, MD    INVOKANA 300 MG TABS tablet TAKE 1 TABLET(300 MG) BY MOUTH DAILY Patient not taking: Reported on 12/29/2015 10/15/15   Renato Shin, MD  JANUVIA 100 MG tablet TAKE 1 TABLET(100 MG) BY MOUTH DAILY Patient not taking: Reported on 12/29/2015 08/09/15   Renato Shin, MD  JANUVIA 100 MG tablet TAKE 1 TABLET(100 MG) BY MOUTH DAILY Patient not taking: Reported on 12/29/2015 09/14/15   Renato Shin, MD  JANUVIA 100 MG tablet TAKE 1 TABLET(100 MG) BY MOUTH DAILY Patient not taking: Reported on 12/29/2015 10/15/15   Renato Shin, MD  JANUVIA 100 MG tablet TAKE 1 TABLET(100 MG) BY MOUTH DAILY Patient not taking: Reported on 12/29/2015 12/21/15   Renato Shin, MD  JANUVIA 100 MG tablet TAKE 1 TABLET(100 MG) BY MOUTH DAILY Patient not taking: Reported on 12/29/2015 12/21/15   Renato Shin, MD  sildenafil (REVATIO) 20 MG tablet 1-5 pills, as needed for ED symptoms. Patient not taking: Reported on 09/24/2015 05/09/15   Renato Shin, MD    Family History Family History  Problem Relation Age of Onset  . Hypertension Mother   . Hyperlipidemia Mother   . Atrial fibrillation Mother   . Hyperlipidemia Father   . Hypertension Father   . Atrial fibrillation Father     Social History Social History  Substance Use Topics  . Smoking status: Never Smoker  . Smokeless tobacco: Never Used  . Alcohol use No     Allergies   Codeine; Meperidine hcl; and Scopolamine   Review of Systems Review of Systems  Constitutional: Positive for fever.  Cardiovascular: Negative for chest pain and leg swelling.  All other systems reviewed and are negative.    Physical Exam Updated Vital Signs BP 130/70   Pulse 108   Temp 99.2 F (37.3 C) (Oral)   Resp (!) 32   Ht 5\' 9"  (1.753 m)   Wt 205 lb (93 kg)   SpO2 93%   BMI 30.27 kg/m   Physical Exam  Constitutional: He is oriented to person, place, and time. He appears well-developed and well-nourished. No distress.  HENT:  Head: Normocephalic and atraumatic.   Eyes: Pupils are equal, round, and reactive to light.  Neck: Normal range of motion.  Cardiovascular: Normal rate and intact distal pulses.   Pulmonary/Chest: No respiratory distress. He has wheezes. He has rales.  Abdominal: Soft. Normal appearance and bowel sounds are normal. There is no tenderness.  Musculoskeletal: Normal range of motion.  Neurological: He is alert and oriented to person, place, and time. No cranial nerve deficit.  Skin: Skin is warm and dry. No rash noted.  Psychiatric: He has a normal mood and affect. His behavior is normal.  Nursing note and vitals reviewed.    ED Treatments / Results  Labs (all labs ordered are listed, but only abnormal results are displayed) Labs Reviewed  CBC WITH DIFFERENTIAL/PLATELET -  Abnormal; Notable for the following:       Result Value   RBC 3.50 (*)    Hemoglobin 11.9 (*)    HCT 34.4 (*)    Platelets 138 (*)    Lymphs Abs 0.6 (*)    All other components within normal limits  URINALYSIS, ROUTINE W REFLEX MICROSCOPIC (NOT AT [facility]) - Abnormal; Notable for the following:    Color, Urine AMBER (*)    APPearance CLOUDY (*)    Glucose, UA >1000 (*)    Hgb urine dipstick MODERATE (*)    Protein, ur 100 (*)    Leukocytes, UA TRACE (*)    All other components within normal limits  BRAIN NATRIURETIC PEPTIDE - Abnormal; Notable for the following:    B Natriuretic Peptide 211.4 (*)    All other components within normal limits  COMPREHENSIVE METABOLIC PANEL - Abnormal; Notable for the following:    Sodium 133 (*)    CO2 18 (*)    Glucose, Bld 273 (*)    BUN 21 (*)    Creatinine, Ser 1.38 (*)    Calcium 8.2 (*)    Albumin 2.8 (*)    ALT 14 (*)    Total Bilirubin 2.9 (*)    GFR calc non Af Amer 53 (*)    All other components within normal limits  URINE MICROSCOPIC-ADD ON - Abnormal; Notable for the following:    Bacteria, UA RARE (*)    Casts GRANULAR CAST (*)    All other components within normal limits  CULTURE, BLOOD  (ROUTINE X 2)  CULTURE, BLOOD (ROUTINE X 2)  URINE CULTURE  I-STAT CG4 LACTIC ACID, ED    EKG  EKG Interpretation  Date/Time:  Saturday December 29 2015 06:57:15 EDT Ventricular Rate:  123 PR Interval:    QRS Duration: 87 QT Interval:  295 QTC Calculation: 422 R Axis:   25 Text Interpretation:  Sinus tachycardia Borderline low voltage, extremity leads Abnormal ekg Confirmed by Audie Pinto  MD, Brylan Dec ([id number]) on 12/29/2015 8:06:54 AM       Radiology Dg Chest 2 View  Result Date: 12/29/2015 CLINICAL DATA:  65 year male with shortness of breath. Diabetes and hypertension. Initial encounter. EXAM: CHEST  2 VIEW COMPARISON:  04/18/2011. FINDINGS: Chronically elevated right hemidiaphragm. Pulmonary vascular prominence most notable centrally. Subsegmental atelectatic changes suspected peripheral aspect right mid to lower lung zone. This can be assessed on follow-up. No plain film evidence pulmonary malignancy. Mild cardiomegaly. Slightly tortuous aorta. Right shoulder joint degenerative changes. IMPRESSION: Pulmonary vascular congestion most notable centrally. Subsegmental atelectatic changes suspected peripheral aspect right mid to lower lung zone. This can be assessed on follow-up. Cardiomegaly. Electronically Signed   By: Genia Del M.D.   On: 12/29/2015 07:54    Procedures Procedures (including critical care time)  Medications Ordered in ED Medications  sodium chloride 0.9 % bolus 2,000 mL (2,000 mLs Intravenous New Bag/Given 12/29/15 0749)     Initial Impression / Assessment and Plan / ED Course  I have reviewed the triage vital signs and the nursing notes.  Pertinent labs & imaging results that were available during my care of the patient were reviewed by me and considered in my medical decision making (see chart for details).  Clinical Course      Final Clinical Impressions(s) / ED Diagnoses   Final diagnoses:  Hypoxia  SOB (shortness of breath)    New  Prescriptions New Prescriptions   No medications on file  Leonard Schwartz, MD 12/29/15 [phone]

## 2015-12-29 NOTE — ED Triage Notes (Signed)
Pt is from home and was brought to the ED via EMS, c/o of sob and dizziness x 1 day . Pt was recently treated for a UTI and is currently on Abx.  Pt denies pain or burning with urination as per EMS. EMS reports that pt was + for orthostatic with a sitting BP of 160/89 and a standing BP of 130/77. Pt reports that he has not been taking medication for diabetes as he has not been eating EMS reports CBG 215. No vomiting, 1 incidence of diarrhea and + urinary frequencies, and urgency.

## 2015-12-29 NOTE — Progress Notes (Signed)
Dr. Wynelle Cleveland ordered to d/c IVF.

## 2015-12-30 ENCOUNTER — Inpatient Hospital Stay (HOSPITAL_COMMUNITY): Payer: Managed Care, Other (non HMO)

## 2015-12-30 ENCOUNTER — Encounter (HOSPITAL_COMMUNITY): Payer: Self-pay | Admitting: Radiology

## 2015-12-30 DIAGNOSIS — J810 Acute pulmonary edema: Secondary | ICD-10-CM

## 2015-12-30 LAB — BLOOD CULTURE ID PANEL (REFLEXED)
Acinetobacter baumannii: NOT DETECTED
Candida albicans: NOT DETECTED
Candida glabrata: NOT DETECTED
Candida krusei: NOT DETECTED
Candida parapsilosis: NOT DETECTED
Candida tropicalis: NOT DETECTED
Carbapenem resistance: NOT DETECTED
Enterobacter cloacae complex: NOT DETECTED
Enterobacteriaceae species: DETECTED — AB
Enterococcus species: NOT DETECTED
Escherichia coli: NOT DETECTED
Haemophilus influenzae: NOT DETECTED
Klebsiella oxytoca: NOT DETECTED
Klebsiella pneumoniae: DETECTED — AB
Listeria monocytogenes: NOT DETECTED
Methicillin resistance: NOT DETECTED
Neisseria meningitidis: NOT DETECTED
Proteus species: NOT DETECTED
Pseudomonas aeruginosa: NOT DETECTED
Serratia marcescens: NOT DETECTED
Staphylococcus aureus (BCID): NOT DETECTED
Staphylococcus species: NOT DETECTED
Streptococcus agalactiae: NOT DETECTED
Streptococcus pneumoniae: NOT DETECTED
Streptococcus pyogenes: NOT DETECTED
Streptococcus species: NOT DETECTED
Vancomycin resistance: NOT DETECTED

## 2015-12-30 LAB — GLUCOSE, CAPILLARY
Glucose-Capillary: 246 mg/dL — ABNORMAL HIGH (ref 65–99)
Glucose-Capillary: 339 mg/dL — ABNORMAL HIGH (ref 65–99)
Glucose-Capillary: 234 mg/dL — ABNORMAL HIGH (ref 65–99)
Glucose-Capillary: 333 mg/dL — ABNORMAL HIGH (ref 65–99)

## 2015-12-30 LAB — CBC
HCT: 35.4 % — ABNORMAL LOW (ref 39.0–52.0)
Hemoglobin: 11.9 g/dL — ABNORMAL LOW (ref 13.0–17.0)
MCH: 33.6 pg (ref 26.0–34.0)
MCHC: 33.6 g/dL (ref 30.0–36.0)
MCV: 100 fL (ref 78.0–100.0)
Platelets: 145 10*3/uL — ABNORMAL LOW (ref 150–400)
RBC: 3.54 MIL/uL — ABNORMAL LOW (ref 4.22–5.81)
RDW: 15.1 % (ref 11.5–15.5)
WBC: 8.8 10*3/uL (ref 4.0–10.5)

## 2015-12-30 LAB — URINE CULTURE: Culture: NO GROWTH

## 2015-12-30 LAB — ECHOCARDIOGRAM COMPLETE
Height: 69 in
Weight: 3432 oz

## 2015-12-30 LAB — BASIC METABOLIC PANEL
Anion gap: 12 (ref 5–15)
BUN: 26 mg/dL — ABNORMAL HIGH (ref 6–20)
CO2: 17 mmol/L — ABNORMAL LOW (ref 22–32)
Calcium: 8.5 mg/dL — ABNORMAL LOW (ref 8.9–10.3)
Chloride: 105 mmol/L (ref 101–111)
Creatinine, Ser: 1.43 mg/dL — ABNORMAL HIGH (ref 0.61–1.24)
GFR calc Af Amer: 59 mL/min — ABNORMAL LOW (ref >60)
GFR calc non Af Amer: 51 mL/min — ABNORMAL LOW (ref >60)
Glucose, Bld: 237 mg/dL — ABNORMAL HIGH (ref 65–99)
Potassium: 3.5 mmol/L (ref 3.5–5.1)
Sodium: 134 mmol/L — ABNORMAL LOW (ref 135–145)

## 2015-12-30 MED ORDER — SODIUM CHLORIDE 0.9 % IV SOLN
INTRAVENOUS | Status: DC
  Administered 2015-12-30 – 2015-12-31 (×2): via INTRAVENOUS
  Administered 2015-12-31: 1000 mL via INTRAVENOUS

## 2015-12-30 MED ORDER — DEXTROSE 5 % IV SOLN
2.0000 g | INTRAVENOUS | Status: DC
  Administered 2015-12-30 – 2016-01-03 (×5): 2 g via INTRAVENOUS
  Filled 2015-12-30 (×6): qty 2

## 2015-12-30 MED ORDER — IOPAMIDOL (ISOVUE-300) INJECTION 61%
15.0000 mL | Freq: Once | INTRAVENOUS | Status: AC | PRN
  Administered 2015-12-30: 15 mL via ORAL

## 2015-12-30 MED ORDER — IOPAMIDOL (ISOVUE-300) INJECTION 61%
80.0000 mL | Freq: Once | INTRAVENOUS | Status: AC | PRN
  Administered 2015-12-30: 80 mL via INTRAVENOUS

## 2015-12-30 MED ORDER — IPRATROPIUM-ALBUTEROL 0.5-2.5 (3) MG/3ML IN SOLN
3.0000 mL | Freq: Once | RESPIRATORY_TRACT | Status: AC
  Administered 2015-12-30: 3 mL via RESPIRATORY_TRACT

## 2015-12-30 MED ORDER — LINAGLIPTIN 5 MG PO TABS
5.0000 mg | ORAL_TABLET | Freq: Every day | ORAL | Status: DC
  Administered 2015-12-30 – 2016-01-04 (×6): 5 mg via ORAL
  Filled 2015-12-30 (×6): qty 1

## 2015-12-30 NOTE — Progress Notes (Signed)
PROGRESS NOTE  [name]  [id number] DOB: 13-Jun-1950 DOA: 12/29/2015 PCP: [name], MD  Outpatient Specialists: Nephrology, Dr. Marval Regal ID, Dr. Johnnye Sima Endocrinology, Dr. Loanne Drilling  Brief Narrative: [name] is a 65 yo male with a history of well-controlled HIV, and hemochromatosis with NIDDM and hypogonadism who presented to the ED 10/21 due to fever and symptoms of recurrent UTI.  He had been treated for UTI with cipro with resolution of urinary urgency/frequency but had return of symptoms 3 days PTA and noted worsening fever and shaking chills since that time. He also endorses left flank pain. On arrival HR was 120s, RR 30/min, abdomen was benign, and he felt subjectively improved after IV fluids. Urinalysis showed significant glucosuria, granular casts, rare bacteria, 6-30 WBCs, nitrite negative. CXR showed vascular congestion and mild cardiomegaly with atelectasis. Bicarb 18, creatinine 1.38, BNP 211. He was admitted for treatment of sepsis due to presumed urinary source. 1 of 2 blood cultures grew Klebsiella pneumoniae and rocephin was continued.   Assessment & Plan: Principal Problem:   Sepsis ([place]) Active Problems:   Human immunodeficiency virus (HIV) disease ([place])   Diabetes mellitus type 2 with complications ([place])   Disorder of bilirubin excretion   Essential hypertension   HEMOCHROMATOSIS, HX OF   Hyperlipidemia   Hypogonadism male   Hypoxia   Pulmonary edema   UTI (urinary tract infection)  Sepsis due to suspected UTI: Recurrent UTI and Klebsiella bacteremia ?GI source.  - Continue CTX pending culture and susceptibility data.  - IVF continue to aid clearance of contrast with CT - CT abdomen with contrast  Acute respiratory failure: Hypoxemic, requiring O2 without history of pulmonary disease. ?Development of ALI with sepsis. BNP 211 and vascular congestion on initial CXR but echo without significant abnormality.  - Recheck CXR in AM - Continue  supplemental O2 prn  Human immunodeficiency virus (HIV) disease: Adherent to meds, Nondetectable viral load stable for years, last CD4 3 months ago was 4.  - Continue ART   Diabetes mellitus type 2 with complications: Well-controlled (HbA1c in July 6.1%), but suspect infection is acutely exacerbating.  - Restart januvia formulary equivalent now that HF ruled out, hold metformin - SSI, may require basal insulin this PM based on SSI usage - Carb-modified diet.  Stage III CKD: Without significant AKI on admission. Followed by Dr. Marval Regal as outpatient, recently changed ART to tenofovir AF.  - Monitor renal function  Mild acidosis: Lactate normal. Appears somewhat chronic.  - Monitor, control infection and glucose  Gilbert's syndrome: Tbili 2.9 - Monitor  Essential hypertension -Resume amlodipine, ARB and carvedilol with holding parameters  Hemochromatosis:  - No intervention planned  Hypertriglyceridemia - Continue Icosapent  Hypogonadism: Secondary to hemochromatosis - Resume clomiphene  Gout - Continue colchicine and allopurinol  Erectile dysfunction: - Hold sildenafil  DVT prophylaxis: Lovenox Code Status: Full code Family Communication: None at bedside Disposition Plan: Inpatient management of bacteremia with IV antibiotics.   Consultants:   ID, Dr. Tommy Medal by phone only  Procedures:   None  Antimicrobials:  Rocephin (10/21 >> )   Subjective: Pt feels somewhat better since admission. Has been ambulating but has significant dyspnea on exertion which is new. No chest pain, mild cough. Has pain in left flank/LLQ that has actually worsened since admission.  Objective: Vitals:   12/29/15 2026 12/30/15 0033 12/30/15 0534 12/30/15 1254  BP: 106/63  126/63 116/62  Pulse: 93  97 89  Resp: 20  (!) 24 (!) 22  Temp: 98.2 F (  36.8 C)  100.2 F (37.9 C) 98.6 F (37 C)  TempSrc: Oral  Oral Oral  SpO2: 95% 93% 96% 97%  Weight:      Height:         Intake/Output Summary (Last 24 hours) at 12/30/15 1438 Last data filed at 12/30/15 1255  Gross per 24 hour  Intake              760 ml  Output             2000 ml  Net            -1240 ml   Filed Weights   12/29/15 0656 12/29/15 1357  Weight: 93 kg (205 lb) 97.3 kg (214 lb 8 oz)    Examination: General exam: 65 y.o. male in no distress Respiratory system: Mildly labored breathing without O2 while walking that improves when seated. Decreased sounds at right base without crackles or wheezes. Cardiovascular system: Regular rate and rhythm. No murmur, rub, or gallop. No JVD, and no pedal edema. Gastrointestinal system: Abdomen protuberant, soft, mildly tender in LLQ without rebound or guarding. Hypoactive bowel sounds. No masses. No CVA tenderness.  Central nervous system: Alert and oriented. No focal neurological deficits. MSK: No midline spinal tenderness. Left paraspinal tenderness with spasm noted across lumbar spine. Skin: No rashes, lesions ulcers. No subungual findings.  Psychiatry: Judgement and insight appear normal. Mood & affect appropriate.   Data Reviewed: I have personally reviewed following labs and imaging studies  CBC:  Recent Labs Lab 12/29/15 0755 12/30/15 0431  WBC 6.8 8.8  NEUTROABS 5.7  --   HGB 11.9* 11.9*  HCT 34.4* 35.4*  MCV 98.3 100.0  PLT 138* [id number]*   Basic Metabolic Panel:  Recent Labs Lab 12/29/15 0955 12/30/15 0431  NA 133* 134*  K 4.1 3.5  CL 105 105  CO2 18* 17*  GLUCOSE 273* 237*  BUN 21* 26*  CREATININE 1.38* 1.43*  CALCIUM 8.2* 8.5*   GFR: Estimated Creatinine Clearance: 60.8 mL/min (by C-G formula based on SCr of 1.43 mg/dL (H)). Liver Function Tests:  Recent Labs Lab 12/29/15 0955  AST 35  ALT 14*  ALKPHOS 123  BILITOT 2.9*  PROT 6.5  ALBUMIN 2.8*   No results for input(s): LIPASE, AMYLASE in the last 168 hours. No results for input(s): AMMONIA in the last 168 hours. Coagulation Profile: No results for  input(s): INR, PROTIME in the last 168 hours. Cardiac Enzymes: No results for input(s): CKTOTAL, CKMB, CKMBINDEX, TROPONINI in the last 168 hours. BNP (last 3 results) No results for input(s): PROBNP in the last 8760 hours. HbA1C: No results for input(s): HGBA1C in the last 72 hours. CBG:  Recent Labs Lab 12/29/15 1336 12/29/15 1729 12/29/15 2024 12/30/15 0740 12/30/15 1152  GLUCAP 286* 293* 242* 246* 339*   Lipid Profile: No results for input(s): CHOL, HDL, LDLCALC, TRIG, CHOLHDL, LDLDIRECT in the last 72 hours. Thyroid Function Tests: No results for input(s): TSH, T4TOTAL, FREET4, T3FREE, THYROIDAB in the last 72 hours. Anemia Panel: No results for input(s): VITAMINB12, FOLATE, FERRITIN, TIBC, IRON, RETICCTPCT in the last 72 hours. Urine analysis:    Component Value Date/Time   COLORURINE AMBER (A) 12/29/2015 0824   APPEARANCEUR CLOUDY (A) 12/29/2015 0824   LABSPEC 1.026 12/29/2015 0824   PHURINE 6.0 12/29/2015 0824   GLUCOSEU >1000 (A) 12/29/2015 0824   GLUCOSEU NEG mg/dL 08/24/2006 1916   HGBUR MODERATE (A) 12/29/2015 0824   HGBUR negative 01/04/2007 0918   BILIRUBINUR NEGATIVE  12/29/2015 [place] 12/29/2015 0824   PROTEINUR 100 (A) 12/29/2015 0824   UROBILINOGEN 0.2 04/18/2011 1404   NITRITE NEGATIVE 12/29/2015 0824   LEUKOCYTESUR TRACE (A) 12/29/2015 0824   Sepsis Labs: @LABRCNTIP (procalcitonin:4,lacticidven:4)  ) Recent Results (from the past 240 hour(s))  Culture, blood (Routine X 2) w Reflex to ID Panel     Status: None (Preliminary result)   Collection Time: 12/29/15  7:48 AM  Result Value Ref Range Status   Specimen Description BLOOD BLOOD LEFT FOREARM  Final   Special Requests BOTTLES DRAWN AEROBIC AND ANAEROBIC 5 [facility] [place]  Final   Culture  Setup Time   Final    AEROBIC BOTTLE ONLY GRAM NEGATIVE RODS CRITICAL RESULT CALLED TO, READ BACK BY AND VERIFIED WITH: TO LPOINTDEXTER(PHARD) BY TCLEVELAND 12/30/2015 AT 5:51AM Performed at [place]  Final   Report Status PENDING  Incomplete  Blood Culture ID Panel (Reflexed)     Status: Abnormal   Collection Time: 12/29/15  7:48 AM  Result Value Ref Range Status   Enterococcus species NOT DETECTED NOT DETECTED Final   Vancomycin resistance NOT DETECTED NOT DETECTED Final   Listeria monocytogenes NOT DETECTED NOT DETECTED Final   Staphylococcus species NOT DETECTED NOT DETECTED Final   Staphylococcus aureus NOT DETECTED NOT DETECTED Final   Methicillin resistance NOT DETECTED NOT DETECTED Final   Streptococcus species NOT DETECTED NOT DETECTED Final   Streptococcus agalactiae NOT DETECTED NOT DETECTED Final   Streptococcus pneumoniae NOT DETECTED NOT DETECTED Final   Streptococcus pyogenes NOT DETECTED NOT DETECTED Final   Acinetobacter baumannii NOT DETECTED NOT DETECTED Final   Enterobacteriaceae species DETECTED (A) NOT DETECTED Final    Comment: CRITICAL RESULT CALLED TO, READ BACK BY AND VERIFIED WITH: TO LPOINTDEXTER(PHARD) BY TCLEVELAND 12/30/2015 AT 5:51AM    Enterobacter cloacae complex NOT DETECTED NOT DETECTED Final   Escherichia coli NOT DETECTED NOT DETECTED Final   Klebsiella oxytoca NOT DETECTED NOT DETECTED Final   Klebsiella pneumoniae DETECTED (A) NOT DETECTED Final    Comment: CRITICAL RESULT CALLED TO, READ BACK BY AND VERIFIED WITH: TO LPOINTDEXTER(PHARD) BY TCLEVELAND 12/30/2015 AT 5:51AM    Proteus species NOT DETECTED NOT DETECTED Final   Serratia marcescens NOT DETECTED NOT DETECTED Final   Carbapenem resistance NOT DETECTED NOT DETECTED Final   Haemophilus influenzae NOT DETECTED NOT DETECTED Final   Neisseria meningitidis NOT DETECTED NOT DETECTED Final   Pseudomonas aeruginosa NOT DETECTED NOT DETECTED Final   Candida albicans NOT DETECTED NOT DETECTED Final   Candida glabrata NOT DETECTED NOT DETECTED Final   Candida krusei NOT DETECTED NOT DETECTED Final   Candida parapsilosis NOT DETECTED NOT  DETECTED Final   Candida tropicalis NOT DETECTED NOT DETECTED Final    Comment: Performed at [facility]  Culture, blood (Routine X 2) w Reflex to ID Panel     Status: None (Preliminary result)   Collection Time: 12/29/15  7:55 AM  Result Value Ref Range Status   Specimen Description BLOOD BLOOD RIGHT FOREARM  Final   Special Requests BOTTLES DRAWN AEROBIC AND ANAEROBIC 5CC EACH  Final   Culture   Final    NO GROWTH 1 DAY Performed at [facility]    Report Status PENDING  Incomplete  Urine culture     Status: None   Collection Time: 12/29/15  8:24 AM  Result Value Ref Range Status   Specimen Description URINE, RANDOM  Final  Special Requests NONE  Final   Culture NO GROWTH Performed at [facility]   Final   Report Status 12/30/2015 FINAL  Final     Radiology Studies: Dg Chest 2 View  Result Date: 12/29/2015 CLINICAL DATA:  65 year male with shortness of breath. Diabetes and hypertension. Initial encounter. EXAM: CHEST  2 VIEW COMPARISON:  04/18/2011. FINDINGS: Chronically elevated right hemidiaphragm. Pulmonary vascular prominence most notable centrally. Subsegmental atelectatic changes suspected peripheral aspect right mid to lower lung zone. This can be assessed on follow-up. No plain film evidence pulmonary malignancy. Mild cardiomegaly. Slightly tortuous aorta. Right shoulder joint degenerative changes. IMPRESSION: Pulmonary vascular congestion most notable centrally. Subsegmental atelectatic changes suspected peripheral aspect right mid to lower lung zone. This can be assessed on follow-up. Cardiomegaly. Electronically Signed   By: Genia Del M.D.   On: 12/29/2015 07:54    Scheduled Meds: . allopurinol  300 mg Oral Daily  . amLODipine  10 mg Oral Daily  . carvedilol  25 mg Oral Daily  . cefTRIAXone (ROCEPHIN)  IV  2 g Intravenous Q24H  . clomiPHENE  50 mg Oral Daily  . emtricitabine-rilpivir-tenofovir AF  1 tablet Oral Q breakfast  .  enoxaparin (LOVENOX) injection  40 mg Subcutaneous Daily  . Icosapent Ethyl  1 g Oral BID  . insulin aspart  0-15 Units Subcutaneous TID WC  . insulin aspart  0-5 Units Subcutaneous QHS  . irbesartan  300 mg Oral Daily   Continuous Infusions: . sodium chloride 125 mL/hr at 12/30/15 1335     LOS: 1 day   Time spent: 25 minutes.  Vance Gather, MD [facility] Pager [phone]  If 7PM-7AM, please contact night-coverage [email or web address] Password [facility] 12/30/2015, 2:38 PM

## 2015-12-30 NOTE — Progress Notes (Signed)
*  PRELIMINARY RESULTS* Echocardiogram 2D Echocardiogram has been performed.  Beryle Beams 12/30/2015, 11:17 AM

## 2015-12-30 NOTE — Progress Notes (Signed)
PHARMACY - PHYSICIAN COMMUNICATION CRITICAL VALUE ALERT - BLOOD CULTURE IDENTIFICATION (BCID)  Results for orders placed or performed during the hospital encounter of 12/29/15  Blood Culture ID Panel (Reflexed) (Collected: 12/29/2015  7:48 AM)  Result Value Ref Range   Enterococcus species NOT DETECTED NOT DETECTED   Vancomycin resistance NOT DETECTED NOT DETECTED   Listeria monocytogenes NOT DETECTED NOT DETECTED   Staphylococcus species NOT DETECTED NOT DETECTED   Staphylococcus aureus NOT DETECTED NOT DETECTED   Methicillin resistance NOT DETECTED NOT DETECTED   Streptococcus species NOT DETECTED NOT DETECTED   Streptococcus agalactiae NOT DETECTED NOT DETECTED   Streptococcus pneumoniae NOT DETECTED NOT DETECTED   Streptococcus pyogenes NOT DETECTED NOT DETECTED   Acinetobacter baumannii NOT DETECTED NOT DETECTED   Enterobacteriaceae species DETECTED (A) NOT DETECTED   Enterobacter cloacae complex NOT DETECTED NOT DETECTED   Escherichia coli NOT DETECTED NOT DETECTED   Klebsiella oxytoca NOT DETECTED NOT DETECTED   Klebsiella pneumoniae DETECTED (A) NOT DETECTED   Proteus species NOT DETECTED NOT DETECTED   Serratia marcescens NOT DETECTED NOT DETECTED   Carbapenem resistance NOT DETECTED NOT DETECTED   Haemophilus influenzae NOT DETECTED NOT DETECTED   Neisseria meningitidis NOT DETECTED NOT DETECTED   Pseudomonas aeruginosa NOT DETECTED NOT DETECTED   Candida albicans NOT DETECTED NOT DETECTED   Candida glabrata NOT DETECTED NOT DETECTED   Candida krusei NOT DETECTED NOT DETECTED   Candida parapsilosis NOT DETECTED NOT DETECTED   Candida tropicalis NOT DETECTED NOT DETECTED    Name of physician (or Provider) Contacted: Dr Maudie Mercury  Changes to prescribed antibiotics required: Change Ceftriaxone to 2gm IV q24h  Everette Rank, PharmD 12/30/2015  6:08 AM

## 2015-12-31 ENCOUNTER — Inpatient Hospital Stay (HOSPITAL_COMMUNITY): Payer: Managed Care, Other (non HMO)

## 2015-12-31 DIAGNOSIS — R7881 Bacteremia: Secondary | ICD-10-CM

## 2015-12-31 LAB — BASIC METABOLIC PANEL WITH GFR
Anion gap: 9 (ref 5–15)
BUN: 20 mg/dL (ref 6–20)
CO2: 19 mmol/L — ABNORMAL LOW (ref 22–32)
Calcium: 8.3 mg/dL — ABNORMAL LOW (ref 8.9–10.3)
Chloride: 107 mmol/L (ref 101–111)
Creatinine, Ser: 1.18 mg/dL (ref 0.61–1.24)
GFR calc Af Amer: >60 mL/min
GFR calc non Af Amer: >60 mL/min
Glucose, Bld: 239 mg/dL — ABNORMAL HIGH (ref 65–99)
Potassium: 3.8 mmol/L (ref 3.5–5.1)
Sodium: 135 mmol/L (ref 135–145)

## 2015-12-31 LAB — CBC
HCT: 32.3 % — ABNORMAL LOW (ref 39.0–52.0)
Hemoglobin: 10.8 g/dL — ABNORMAL LOW (ref 13.0–17.0)
MCH: 33.6 pg (ref 26.0–34.0)
MCHC: 33.4 g/dL (ref 30.0–36.0)
MCV: 100.6 fL — ABNORMAL HIGH (ref 78.0–100.0)
Platelets: 169 10*3/uL (ref 150–400)
RBC: 3.21 MIL/uL — ABNORMAL LOW (ref 4.22–5.81)
RDW: 15 % (ref 11.5–15.5)
WBC: 12.1 10*3/uL — ABNORMAL HIGH (ref 4.0–10.5)

## 2015-12-31 LAB — GLUCOSE, CAPILLARY
Glucose-Capillary: 256 mg/dL — ABNORMAL HIGH (ref 65–99)
Glucose-Capillary: 390 mg/dL — ABNORMAL HIGH (ref 65–99)
Glucose-Capillary: 176 mg/dL — ABNORMAL HIGH (ref 65–99)
Glucose-Capillary: 229 mg/dL — ABNORMAL HIGH (ref 65–99)

## 2015-12-31 MED ORDER — INSULIN ASPART 100 UNIT/ML ~~LOC~~ SOLN
3.0000 [IU] | Freq: Three times a day (TID) | SUBCUTANEOUS | Status: DC
  Administered 2015-12-31 – 2016-01-01 (×2): 3 [IU] via SUBCUTANEOUS

## 2015-12-31 MED ORDER — INSULIN GLARGINE 100 UNIT/ML ~~LOC~~ SOLN
12.0000 [IU] | Freq: Every day | SUBCUTANEOUS | Status: DC
  Administered 2015-12-31: 12 [IU] via SUBCUTANEOUS
  Filled 2015-12-31: qty 0.12

## 2015-12-31 NOTE — Progress Notes (Signed)
PROGRESS NOTE  [name]  [id number] DOB: 1950-08-13 DOA: 12/29/2015 PCP: [name], MD  Outpatient Specialists: Nephrology, Dr. Marval Regal ID, Dr. Johnnye Sima Endocrinology, Dr. Loanne Drilling  Brief Narrative: [name] is a 65 yo male with a history of well-controlled HIV, and hemochromatosis with NIDDM and hypogonadism who presented to the ED 10/21 due to fever and symptoms of recurrent UTI.   He had been treated for UTI with cipro with resolution of urinary urgency/frequency but had return of symptoms 3 days PTA and noted worsening fever and shaking chills since that time. He also endorses left flank pain. On arrival HR was 120s, RR 30/min, abdomen was benign, and he felt subjectively improved after IV fluids. Urinalysis showed significant glucosuria, granular casts, rare bacteria, 6-30 WBCs, nitrite negative. CXR showed vascular congestion and mild cardiomegaly with atelectasis. Bicarb 18, creatinine 1.38, BNP 211. He was admitted for treatment of sepsis due to presumed urinary source. 1 of 2 blood cultures grew Klebsiella pneumoniae and rocephin was continued.   Assessment & Plan: Principal Problem:   Sepsis ([place]) Active Problems:   Human immunodeficiency virus (HIV) disease ([place])   Diabetes mellitus type 2 with complications ([place])   Disorder of bilirubin excretion   Essential hypertension   HEMOCHROMATOSIS, HX OF   Hyperlipidemia   Hypogonadism male   Hypoxia   Pulmonary edema   UTI (urinary tract infection)  Sepsis due to pyelonephritis: Recurrent UTI and Klebsiella bacteremia due to left pyelonephritis. CT abdomen with contrast: perinephric stranding consistent with pyelonephritis. No renal stone.  - Continue CTX pending culture and susceptibility data. If remains febrile or leukocytosis worsens, will broaden with imipenem.  - IVF continue to aid clearance of contrast with CT  Acute respiratory failure: Hypoxemic, requiring O2 without history of pulmonary disease.  ?Development of ALI with sepsis. BNP 211 and vascular congestion on initial CXR but echo without significant abnormality. Follow up CXR equivocal for infiltrate.  - Recheck CXR prn - Continue supplemental O2 prn  Human immunodeficiency virus (HIV) disease: Adherent to meds, Nondetectable viral load stable for years, last CD4 3 months ago was 810.  - Continue ART   Diabetes mellitus type 2 with complications: Well-controlled (HbA1c in July 6.1%), but suspect infection is acutely exacerbating.  - Restart januvia formulary equivalent now that HF ruled out, hold metformin - Start lantus 12u, 3u TIDWC - Diabetes coordinator appreciated. - Carb-modified diet.  Stage III CKD: Without significant AKI on admission. Followed by Dr. Marval Regal as outpatient, recently changed ART to tenofovir AF. D/C'ed invokana. - Monitor renal function  Mild acidosis: Lactate normal. Appears somewhat chronic.  - Monitor, control infection and glucose  Gilbert's syndrome: Tbili 2.9 - Monitor  Essential hypertension -Resume amlodipine, ARB and carvedilol with holding parameters  Hemochromatosis:  - No intervention planned  Hypertriglyceridemia - Continue Icosapent  Hypogonadism: Secondary to hemochromatosis - Resume clomiphene  Gout - Continue colchicine and allopurinol  Erectile dysfunction: - Hold sildenafil  DVT prophylaxis: Lovenox Code Status: Full code Family Communication: None at bedside Disposition Plan: Inpatient management of bacteremia with IV antibiotics.   Consultants:   ID, Dr. Tommy Medal by phone only  Procedures:   None  Antimicrobials:  Rocephin (10/21 >> )   Subjective: Pt feels just about the same since admission continues to have significant dyspnea on exertion which is completely new for him. No chest pain, mild cough. Has pain in left flank/LLQ   Objective: Vitals:   12/30/15 1254 12/30/15 2141 12/31/15 0510 12/31/15 1032  BP: 116/62 Marland Kitchen)  149/73 129/72  (!) 155/81  Pulse: 89 98 96   Resp: (!) 22 (!) 24 (!) 24   Temp: 98.6 F (37 C) 100.1 F (37.8 C) 98.9 F (37.2 C)   TempSrc: Oral Oral Oral   SpO2: 97% 98% 94%   Weight:      Height:        Intake/Output Summary (Last 24 hours) at 12/31/15 1430 Last data filed at 12/31/15 1021  Gross per 24 hour  Intake          3047.08 ml  Output             4150 ml  Net         -1102.92 ml   Filed Weights   12/29/15 0656 12/29/15 1357  Weight: 93 kg (205 lb) 97.3 kg (214 lb 8 oz)    Examination: General exam: 65 y.o. male in no distress laying flat in bed Respiratory system: Nonlabored, tachypneic, seems to run out of breath for long sentences. Decreased sounds at right base without crackles or wheezes. Cardiovascular system: Regular rate and rhythm. No murmur, rub, or gallop. No JVD, and no pedal edema. Gastrointestinal system: Abdomen protuberant, soft, not really tender. Hypoactive bowel sounds. No masses. Some discomfort with L CVA percussion.  Central nervous system: Alert and oriented. No focal neurological deficits. MSK: No midline spinal tenderness.  Skin: No rashes, lesions ulcers. No subungual findings.  Psychiatry: Judgement and insight appear normal. Mood & affect appropriate.   Data Reviewed: I have personally reviewed following labs and imaging studies  CBC:  Recent Labs Lab 12/29/15 0755 12/30/15 0431 12/31/15 0344  WBC 6.8 8.8 12.1*  NEUTROABS 5.7  --   --   HGB 11.9* 11.9* 10.8*  HCT 34.4* 35.4* 32.3*  MCV 98.3 100.0 100.6*  PLT 138* 145* [id number]   Basic Metabolic Panel:  Recent Labs Lab 12/29/15 0955 12/30/15 0431 12/31/15 0344  NA 133* 134* 135  K 4.1 3.5 3.8  CL 105 105 107  CO2 18* 17* 19*  GLUCOSE 273* 237* 239*  BUN 21* 26* 20  CREATININE 1.38* 1.43* 1.18  CALCIUM 8.2* 8.5* 8.3*   GFR: Estimated Creatinine Clearance: 73.7 mL/min (by C-G formula based on SCr of 1.18 mg/dL). Liver Function Tests:  Recent Labs Lab 12/29/15 0955  AST 35  ALT  14*  ALKPHOS 123  BILITOT 2.9*  PROT 6.5  ALBUMIN 2.8*   No results for input(s): LIPASE, AMYLASE in the last 168 hours. No results for input(s): AMMONIA in the last 168 hours. Coagulation Profile: No results for input(s): INR, PROTIME in the last 168 hours. Cardiac Enzymes: No results for input(s): CKTOTAL, CKMB, CKMBINDEX, TROPONINI in the last 168 hours. BNP (last 3 results) No results for input(s): PROBNP in the last 8760 hours. HbA1C: No results for input(s): HGBA1C in the last 72 hours. CBG:  Recent Labs Lab 12/30/15 1152 12/30/15 1728 12/30/15 2145 12/31/15 0746 12/31/15 1207  GLUCAP 339* 234* 333* 256* 390*   Lipid Profile: No results for input(s): CHOL, HDL, LDLCALC, TRIG, CHOLHDL, LDLDIRECT in the last 72 hours. Thyroid Function Tests: No results for input(s): TSH, T4TOTAL, FREET4, T3FREE, THYROIDAB in the last 72 hours. Anemia Panel: No results for input(s): VITAMINB12, FOLATE, FERRITIN, TIBC, IRON, RETICCTPCT in the last 72 hours. Urine analysis:    Component Value Date/Time   COLORURINE AMBER (A) 12/29/2015 0824   APPEARANCEUR CLOUDY (A) 12/29/2015 0824   LABSPEC 1.026 12/29/2015 0824   PHURINE 6.0 12/29/2015 [id number]  GLUCOSEU >1000 (A) 12/29/2015 0824   GLUCOSEU NEG mg/dL 08/24/2006 1916   HGBUR MODERATE (A) 12/29/2015 0824   HGBUR negative 01/04/2007 0918   BILIRUBINUR NEGATIVE 12/29/2015 0824   KETONESUR NEGATIVE 12/29/2015 0824   PROTEINUR 100 (A) 12/29/2015 0824   UROBILINOGEN 0.2 04/18/2011 1404   NITRITE NEGATIVE 12/29/2015 0824   LEUKOCYTESUR TRACE (A) 12/29/2015 0824   Sepsis Labs: @LABRCNTIP (procalcitonin:4,lacticidven:4)  ) Recent Results (from the past 240 hour(s))  Culture, blood (Routine X 2) w Reflex to ID Panel     Status: None (Preliminary result)   Collection Time: 12/29/15  7:48 AM  Result Value Ref Range Status   Specimen Description BLOOD BLOOD LEFT FOREARM  Final   Special Requests BOTTLES DRAWN AEROBIC AND ANAEROBIC 5 CCE  [place]  Final   Culture  Setup Time   Final    AEROBIC BOTTLE ONLY GRAM NEGATIVE RODS CRITICAL RESULT CALLED TO, READ BACK BY AND VERIFIED WITH: TO LPOINTDEXTER(PHARD) BY TCLEVELAND 12/30/2015 AT 5:51AM Performed at [facility]  Final   Report Status PENDING  Incomplete  Blood Culture ID Panel (Reflexed)     Status: Abnormal   Collection Time: 12/29/15  7:48 AM  Result Value Ref Range Status   Enterococcus species NOT DETECTED NOT DETECTED Final   Vancomycin resistance NOT DETECTED NOT DETECTED Final   Listeria monocytogenes NOT DETECTED NOT DETECTED Final   Staphylococcus species NOT DETECTED NOT DETECTED Final   Staphylococcus aureus NOT DETECTED NOT DETECTED Final   Methicillin resistance NOT DETECTED NOT DETECTED Final   Streptococcus species NOT DETECTED NOT DETECTED Final   Streptococcus agalactiae NOT DETECTED NOT DETECTED Final   Streptococcus pneumoniae NOT DETECTED NOT DETECTED Final   Streptococcus pyogenes NOT DETECTED NOT DETECTED Final   Acinetobacter baumannii NOT DETECTED NOT DETECTED Final   Enterobacteriaceae species DETECTED (A) NOT DETECTED Final    Comment: CRITICAL RESULT CALLED TO, READ BACK BY AND VERIFIED WITH: TO LPOINTDEXTER(PHARD) BY TCLEVELAND 12/30/2015 AT 5:51AM    Enterobacter cloacae complex NOT DETECTED NOT DETECTED Final   Escherichia coli NOT DETECTED NOT DETECTED Final   Klebsiella oxytoca NOT DETECTED NOT DETECTED Final   Klebsiella pneumoniae DETECTED (A) NOT DETECTED Final    Comment: CRITICAL RESULT CALLED TO, READ BACK BY AND VERIFIED WITH: TO LPOINTDEXTER(PHARD) BY TCLEVELAND 12/30/2015 AT 5:51AM    Proteus species NOT DETECTED NOT DETECTED Final   Serratia marcescens NOT DETECTED NOT DETECTED Final   Carbapenem resistance NOT DETECTED NOT DETECTED Final   Haemophilus influenzae NOT DETECTED NOT DETECTED Final   Neisseria meningitidis NOT DETECTED NOT DETECTED Final   Pseudomonas aeruginosa NOT  DETECTED NOT DETECTED Final   Candida albicans NOT DETECTED NOT DETECTED Final   Candida glabrata NOT DETECTED NOT DETECTED Final   Candida krusei NOT DETECTED NOT DETECTED Final   Candida parapsilosis NOT DETECTED NOT DETECTED Final   Candida tropicalis NOT DETECTED NOT DETECTED Final    Comment: Performed at [facility]  Culture, blood (Routine X 2) w Reflex to ID Panel     Status: None (Preliminary result)   Collection Time: 12/29/15  7:55 AM  Result Value Ref Range Status   Specimen Description BLOOD BLOOD RIGHT FOREARM  Final   Special Requests BOTTLES DRAWN AEROBIC AND ANAEROBIC 5CC EACH  Final   Culture   Final    NO GROWTH 1 DAY Performed at [facility]    Report Status PENDING  Incomplete  Urine culture  Status: None   Collection Time: 12/29/15  8:24 AM  Result Value Ref Range Status   Specimen Description URINE, RANDOM  Final   Special Requests NONE  Final   Culture NO GROWTH Performed at [facility]   Final   Report Status 12/30/2015 FINAL  Final     Radiology Studies: Dg Chest 2 View  Result Date: 12/31/2015 CLINICAL DATA:  Sob, cough and fever x 1 week.  nonsmoker EXAM: CHEST  2 VIEW COMPARISON:  12/29/2015 FINDINGS: Moderate thoracic spondylosis. Surgical changes about the right glenohumeral joint. Midline trachea. Mild cardiomegaly. Mediastinal contours otherwise within normal limits. No pleural effusion or pneumothorax. Patchy left base pulmonary opacity. Surgical clips in the right upper quadrant. IMPRESSION: Patchy left base pulmonary opacities which are favored to represent areas of atelectasis. Early or mild pneumonia cannot be excluded. If symptoms persist, radiographic follow-up in 2-4 days may be informative. Electronically Signed   By: Abigail Miyamoto M.D.   On: 12/31/2015 08:25   Ct Abdomen Pelvis W Contrast  Result Date: 12/30/2015 CLINICAL DATA:  65 year old male with history of fever and recurrent urinary tract infection.  History of HIV. History of hemochromatosis. EXAM: CT ABDOMEN AND PELVIS WITH CONTRAST TECHNIQUE: Multidetector CT imaging of the abdomen and pelvis was performed using the standard protocol following bolus administration of intravenous contrast. CONTRAST:  85mL ISOVUE-300 IOPAMIDOL (ISOVUE-300) INJECTION 61%, 110mL ISOVUE-300 IOPAMIDOL (ISOVUE-300) INJECTION 61% COMPARISON:  CT the abdomen and pelvis 10/22/2007. FINDINGS: Lower chest: Mild scarring throughout the visualize lung bases. Hepatobiliary: The liver has a shrunken appearance and nodular contour, suggestive of cirrhosis. No discrete cystic or solid hepatic lesions. No intra or extrahepatic biliary ductal dilatation. Status post cholecystectomy. Pancreas: No pancreatic mass. No pancreatic ductal dilatation. No pancreatic or peripancreatic fluid or inflammatory changes. Spleen: Spleen is enlarged measuring 15.6 x 5.6 x 16.4 cm (estimated splenic volume of 716 mL). Small calcified granuloma in the superior aspect of the spleen. Adrenals/Urinary Tract: Heterogeneous areas of hypoenhancement in the left kidney with surrounding perinephric stranding, concerning for left-sided pyelonephritis. Right kidney is normal in appearance. Mild left hydroureter. No right-sided hydroureter. Urinary bladder is normal in appearance. Surgical clips above the right kidney, presumably from prior right adrenalectomy. Left adrenal gland is normal in appearance. Stomach/Bowel: The appearance of the stomach is normal. There is no pathologic dilatation of small bowel or colon. The appendix is not confidently identified and may be surgically absent. Regardless, there are no inflammatory changes noted adjacent to the cecum to suggest the presence of an acute appendicitis at this time. Vascular/Lymphatic: Aortic atherosclerosis, without evidence of aneurysm in the abdominal or pelvic vasculature. No lymphadenopathy noted in the abdomen or pelvis. Reproductive: Prostate gland and seminal  vesicles are unremarkable in appearance. Other: No significant volume of ascites.  No pneumoperitoneum. Musculoskeletal: There are no aggressive appearing lytic or blastic lesions noted in the visualized portions of the skeleton. IMPRESSION: 1. Findings are concerning for uncomplicated left-sided pyelonephritis. Correlation with urinalysis is recommended. 2. Morphologic changes in the liver compatible with underlying cirrhosis. 3. Splenomegaly. Electronically Signed   By: Vinnie Langton M.D.   On: 12/30/2015 17:47    Scheduled Meds: . allopurinol  300 mg Oral Daily  . amLODipine  10 mg Oral Daily  . carvedilol  25 mg Oral Daily  . cefTRIAXone (ROCEPHIN)  IV  2 g Intravenous Q24H  . clomiPHENE  50 mg Oral Daily  . emtricitabine-rilpivir-tenofovir AF  1 tablet Oral Q breakfast  . enoxaparin (LOVENOX)  injection  40 mg Subcutaneous Daily  . Icosapent Ethyl  1 g Oral BID  . insulin aspart  0-15 Units Subcutaneous TID WC  . insulin aspart  0-5 Units Subcutaneous QHS  . irbesartan  300 mg Oral Daily  . linagliptin  5 mg Oral Daily   Continuous Infusions: . sodium chloride 125 mL/hr at 12/31/15 0648     LOS: 2 days   Time spent: 25 minutes.  Vance Gather, MD [facility] Pager [phone]  If 7PM-7AM, please contact night-coverage [email or web address] Password [email or web address] 12/31/2015, 2:30 PM

## 2015-12-31 NOTE — Progress Notes (Signed)
Inpatient Diabetes Program Recommendations  AACE/ADA: New Consensus Statement on Inpatient Glycemic Control (2015)  Target Ranges:  Prepandial:   less than 140 mg/dL      Peak postprandial:   less than 180 mg/dL (1-2 hours)      Critically ill patients:  140 - 180 mg/dL   Lab Results  Component Value Date   GLUCAP 256 (H) 12/31/2015   HGBA1C 6.1 09/13/2014    Review of Glycemic Control  Diabetes history: DM2 Outpatient Diabetes medications: Januvia 100 mg QD, metformin 1000 mg bid Current orders for Inpatient glycemic control: Novolog moderate tidwc and hs, tradjenta 5 mg QD. Pt previously on Invokana. Nephrologist discontinued. Pt states he checks blood sugars at home. Dr. Maudie Mercury is managing his diabetes.  Inpatient Diabetes Program Recommendations:    Add Lantus 12 units QHS Add Novolog 3 units tidwc for meal coverage insulin if pt eats > 50% meal Add CHO mod diet to heart healthy.  Will continue to follow. Thank you. Lorenda Peck, RD, LDN, CDE Inpatient Diabetes Coordinator [phone]

## 2015-12-31 NOTE — Progress Notes (Signed)
Nursing Note: Pt to radiology for STAT chest X-ray 2 view.wbb

## 2015-12-31 NOTE — Progress Notes (Signed)
Nursing Note: Pt c/o pain in L upper chest area when taking a deep breath.Pt says this is new.He says it has hurt for a couple of hours but he thought it would go away.Lungs clear to ausculation.T-99.4 p-89 r-18 bp-143/73 PO2 97% on 2L n/c.A: Paged on-call.wbb

## 2016-01-01 ENCOUNTER — Inpatient Hospital Stay (HOSPITAL_COMMUNITY): Payer: Managed Care, Other (non HMO)

## 2016-01-01 DIAGNOSIS — N10 Acute tubulo-interstitial nephritis: Secondary | ICD-10-CM

## 2016-01-01 DIAGNOSIS — R0781 Pleurodynia: Secondary | ICD-10-CM

## 2016-01-01 LAB — CULTURE, BLOOD (ROUTINE X 2)

## 2016-01-01 LAB — BASIC METABOLIC PANEL
Anion gap: 7 (ref 5–15)
BUN: 21 mg/dL — ABNORMAL HIGH (ref 6–20)
CO2: 20 mmol/L — ABNORMAL LOW (ref 22–32)
Calcium: 8.1 mg/dL — ABNORMAL LOW (ref 8.9–10.3)
Chloride: 110 mmol/L (ref 101–111)
Creatinine, Ser: 1.16 mg/dL (ref 0.61–1.24)
GFR calc Af Amer: >60 mL/min (ref >60)
GFR calc non Af Amer: >60 mL/min (ref >60)
Glucose, Bld: 225 mg/dL — ABNORMAL HIGH (ref 65–99)
Potassium: 3.8 mmol/L (ref 3.5–5.1)
Sodium: 137 mmol/L (ref 135–145)

## 2016-01-01 LAB — BLOOD CULTURE ID PANEL (REFLEXED)

## 2016-01-01 LAB — CBC
HCT: 32.4 % — ABNORMAL LOW (ref 39.0–52.0)
Hemoglobin: 10.7 g/dL — ABNORMAL LOW (ref 13.0–17.0)
MCH: 33.4 pg (ref 26.0–34.0)
MCHC: 33 g/dL (ref 30.0–36.0)
MCV: 101.3 fL — ABNORMAL HIGH (ref 78.0–100.0)
Platelets: 190 10*3/uL (ref 150–400)
RBC: 3.2 MIL/uL — ABNORMAL LOW (ref 4.22–5.81)
RDW: 15.3 % (ref 11.5–15.5)
WBC: 11.7 10*3/uL — ABNORMAL HIGH (ref 4.0–10.5)

## 2016-01-01 LAB — TROPONIN I: Troponin I: 0.03 ng/mL (ref <0.03)

## 2016-01-01 LAB — GLUCOSE, CAPILLARY
Glucose-Capillary: 222 mg/dL — ABNORMAL HIGH (ref 65–99)
Glucose-Capillary: 290 mg/dL — ABNORMAL HIGH (ref 65–99)
Glucose-Capillary: 157 mg/dL — ABNORMAL HIGH (ref 65–99)
Glucose-Capillary: 163 mg/dL — ABNORMAL HIGH (ref 65–99)

## 2016-01-01 MED ORDER — OXYCODONE-ACETAMINOPHEN 5-325 MG PO TABS: 1.0000 | ORAL_TABLET | ORAL | Status: DC | PRN

## 2016-01-01 MED ORDER — INSULIN GLARGINE 100 UNIT/ML ~~LOC~~ SOLN
20.0000 [IU] | Freq: Every day | SUBCUTANEOUS | Status: DC
  Administered 2016-01-01 – 2016-01-03 (×3): 20 [IU] via SUBCUTANEOUS
  Filled 2016-01-01 (×4): qty 0.2

## 2016-01-01 MED ORDER — OXYCODONE-ACETAMINOPHEN 5-325 MG PO TABS
2.0000 | ORAL_TABLET | Freq: Once | ORAL | Status: AC
  Administered 2016-01-01: 2 via ORAL
  Filled 2016-01-01 (×2): qty 2

## 2016-01-01 MED ORDER — IOPAMIDOL (ISOVUE-370) INJECTION 76%
100.0000 mL | Freq: Once | INTRAVENOUS | Status: AC | PRN
Start: 2016-01-01 — End: 2016-01-01
  Administered 2016-01-01: 100 mL via INTRAVENOUS

## 2016-01-01 MED ORDER — INSULIN ASPART 100 UNIT/ML ~~LOC~~ SOLN
5.0000 [IU] | Freq: Three times a day (TID) | SUBCUTANEOUS | Status: DC
  Administered 2016-01-01 – 2016-01-04 (×9): 5 [IU] via SUBCUTANEOUS

## 2016-01-01 MED ORDER — KETOROLAC TROMETHAMINE 30 MG/ML IJ SOLN
30.0000 mg | Freq: Once | INTRAMUSCULAR | Status: AC
  Administered 2016-01-01: 30 mg via INTRAVENOUS
  Filled 2016-01-01: qty 1

## 2016-01-01 NOTE — Progress Notes (Addendum)
Nursing Note: Talked with on-call and IV pain med ordered and given at 0036.wbb

## 2016-01-01 NOTE — Progress Notes (Signed)
PROGRESS NOTE  [name]  [id number] DOB: 08/06/1950 DOA: 12/29/2015 PCP: [name], MD  Outpatient Specialists: Nephrology, Dr. Marval Regal ID, Dr. Johnnye Sima Endocrinology, Dr. Loanne Drilling  Brief Narrative: [name] is a 65 yo male with a history of well-controlled HIV, and hemochromatosis with NIDDM and hypogonadism who presented to the ED 10/21 due to fever and symptoms of recurrent UTI.   He had been treated for UTI with cipro with resolution of urinary urgency/frequency but had return of symptoms 3 days PTA and noted worsening fever and shaking chills since that time. He also endorses left flank pain. On arrival HR was 120s, RR 30/min, abdomen was benign, and he felt subjectively improved after IV fluids. Urinalysis showed significant glucosuria, granular casts, rare bacteria, 6-30 WBCs, nitrite negative. CXR showed vascular congestion and mild cardiomegaly with atelectasis. Bicarb 18, creatinine 1.38, BNP 211. He was admitted for treatment of sepsis due to presumed urinary source. 1 of 2 blood cultures grew Klebsiella pneumoniae, found not to be ESBL, sensitive to CTX, which has been administered.and continued.   Assessment & Plan: Principal Problem:   Sepsis ([place]) Active Problems:   Human immunodeficiency virus (HIV) disease ([place])   Diabetes mellitus type 2 with complications ([place])   Disorder of bilirubin excretion   Essential hypertension   HEMOCHROMATOSIS, HX OF   Hyperlipidemia   Hypogonadism male   Hypoxia   Pulmonary edema   UTI (urinary tract infection)  Sepsis/Klebsiella bacteremia due to pyelonephritis: Recurrent UTI and Klebsiella bacteremia due to left pyelonephritis. CT abdomen with contrast: perinephric stranding consistent with pyelonephritis. No renal stone.  - Continue CTX (no ESBL on culture)  - IVF continue to aid clearance of contrast with CT  Acute respiratory failure: Hypoxemic, requiring O2 without history of pulmonary disease. ?Development of ALI  with sepsis. BNP 211 and vascular congestion on initial CXR but echo without significant abnormality. Follow up CXR equivocal for infiltrate. 10/23 overnight pt developed abrupt left anterior pleuritic chest pain. Has been receiving lovenox for DVT ppx.  - D dimer likely to be positive regardless with concurrent sepsis, so will get CTA chest to r/o PE. - Troponin x1, ECG - Continue supplemental O2 prn  Human immunodeficiency virus (HIV) disease: Adherent to meds, Nondetectable viral load stable for years, last CD4 3 months ago was 810.  - Continue ART   Diabetes mellitus type 2 with complications: Well-controlled (HbA1c in July 6.1%), but suspect infection is acutely exacerbating.  - Restart januvia formulary equivalent now that HF ruled out, hold metformin - Start lantus 12u >> increase to 20u, 3u TIDWC >> increase to 5u [facility] - Diabetes coordinator appreciated. - Carb-modified diet.  Stage III CKD: Without significant AKI on admission. Followed by Dr. Marval Regal as outpatient, recently changed ART to tenofovir AF. D/C'ed invokana. - Monitor renal function  Mild acidosis: Lactate normal. Appears somewhat chronic.  - Monitor, control infection and glucose  Gilbert's syndrome: Tbili 2.9 - Monitor  Essential hypertension -Resume amlodipine, ARB and carvedilol with holding parameters  Hemochromatosis:  - No intervention planned  Hypertriglyceridemia - Continue Icosapent  Hypogonadism: Secondary to hemochromatosis - Resume clomiphene  Gout - Continue colchicine and allopurinol  Erectile dysfunction: - Hold sildenafil  DVT prophylaxis: Lovenox Code Status: Full code Family Communication: None at bedside, declined offer to call. Disposition Plan: Inpatient management of bacteremia with IV antibiotics.   Consultants:   ID, Dr. Tommy Medal by phone only  Procedures:   None  Antimicrobials:  Rocephin (10/21 >> )   Subjective:  Pt still feels dyspneic, but  experienced abrupt onset of left anterior chest pain only with deep inspiration that is sharp and mod-severe. Not changed with position or exertion (though very dyspneic on exertion).   Objective: Vitals:   12/31/15 2219 12/31/15 2259 01/01/16 0634 01/01/16 0953  BP: 130/65 (!) 143/73 118/63 135/73  Pulse: 89 89 82   Resp: 20 18 18    Temp: 98.3 F (36.8 C) 99.4 F (37.4 C) (!) 96.7 F (35.9 C)   TempSrc: Oral Oral Oral   SpO2: 98% 97% 99%   Weight:      Height:        Intake/Output Summary (Last 24 hours) at 01/01/16 1142 Last data filed at 01/01/16 [id number]  Gross per 24 hour  Intake             3819 ml  Output             1751 ml  Net             2068 ml   Filed Weights   12/29/15 0656 12/29/15 1357  Weight: 93 kg (205 lb) 97.3 kg (214 lb 8 oz)    Examination: General exam: 65 y.o. male in no distress laying flat in bed Respiratory system: Nonlabored, tachypneic, seems to run out of breath for long sentences. Decreased sounds at right base without crackles or wheezes. Cardiovascular system: Regular rate and rhythm. No murmur, rub, or gallop. No JVD, and no pedal edema. Gastrointestinal system: Abdomen protuberant, soft, not tender. + bowel sounds. No masses. Some discomfort with L CVA percussion.  Central nervous system: Alert and oriented. No focal neurological deficits. MSK: No midline spinal tenderness.  Skin: No rashes, lesions ulcers. No subungual findings.  Psychiatry: Judgement and insight appear normal. Mood & affect appropriate.   Data Reviewed: I have personally reviewed following labs and imaging studies  CBC:  Recent Labs Lab 12/29/15 0755 12/30/15 0431 12/31/15 0344 01/01/16 0356  WBC 6.8 8.8 12.1* 11.7*  NEUTROABS 5.7  --   --   --   HGB 11.9* 11.9* 10.8* 10.7*  HCT 34.4* 35.4* 32.3* 32.4*  MCV 98.3 100.0 100.6* 101.3*  PLT 138* 145* 169 [id number]   Basic Metabolic Panel:  Recent Labs Lab 12/29/15 0955 12/30/15 0431 12/31/15 0344 01/01/16 0356    NA 133* 134* 135 137  K 4.1 3.5 3.8 3.8  CL 105 105 107 110  CO2 18* 17* 19* 20*  GLUCOSE 273* 237* 239* 225*  BUN 21* 26* 20 21*  CREATININE 1.38* 1.43* 1.18 1.16  CALCIUM 8.2* 8.5* 8.3* 8.1*   GFR: Estimated Creatinine Clearance: 75 mL/min (by C-G formula based on SCr of 1.16 mg/dL). Liver Function Tests:  Recent Labs Lab 12/29/15 0955  AST 35  ALT 14*  ALKPHOS 123  BILITOT 2.9*  PROT 6.5  ALBUMIN 2.8*   No results for input(s): LIPASE, AMYLASE in the last 168 hours. No results for input(s): AMMONIA in the last 168 hours. Coagulation Profile: No results for input(s): INR, PROTIME in the last 168 hours. Cardiac Enzymes: No results for input(s): CKTOTAL, CKMB, CKMBINDEX, TROPONINI in the last 168 hours. BNP (last 3 results) No results for input(s): PROBNP in the last 8760 hours. HbA1C: No results for input(s): HGBA1C in the last 72 hours. CBG:  Recent Labs Lab 12/31/15 0746 12/31/15 1207 12/31/15 1709 12/31/15 2151 01/01/16 0800  GLUCAP 256* 390* 176* 229* 222*   Lipid Profile: No results for input(s): CHOL, HDL, LDLCALC, TRIG, CHOLHDL, LDLDIRECT  in the last 72 hours. Thyroid Function Tests: No results for input(s): TSH, T4TOTAL, FREET4, T3FREE, THYROIDAB in the last 72 hours. Anemia Panel: No results for input(s): VITAMINB12, FOLATE, FERRITIN, TIBC, IRON, RETICCTPCT in the last 72 hours. Urine analysis:    Component Value Date/Time   COLORURINE AMBER (A) 12/29/2015 0824   APPEARANCEUR CLOUDY (A) 12/29/2015 0824   LABSPEC 1.026 12/29/2015 0824   PHURINE 6.0 12/29/2015 0824   GLUCOSEU >1000 (A) 12/29/2015 0824   GLUCOSEU NEG mg/dL 08/24/2006 1916   HGBUR MODERATE (A) 12/29/2015 0824   HGBUR negative 01/04/2007 0918   BILIRUBINUR NEGATIVE 12/29/2015 0824   KETONESUR NEGATIVE 12/29/2015 0824   PROTEINUR 100 (A) 12/29/2015 0824   UROBILINOGEN 0.2 04/18/2011 1404   NITRITE NEGATIVE 12/29/2015 0824   LEUKOCYTESUR TRACE (A) 12/29/2015 0824   Sepsis  Labs: @LABRCNTIP (procalcitonin:4,lacticidven:4)  ) Recent Results (from the past 240 hour(s))  Culture, blood (Routine X 2) w Reflex to ID Panel     Status: Abnormal   Collection Time: 12/29/15  7:48 AM  Result Value Ref Range Status   Specimen Description BLOOD BLOOD LEFT FOREARM  Final   Special Requests BOTTLES DRAWN AEROBIC AND ANAEROBIC 5 CCE ACH  Final   Culture  Setup Time   Final    AEROBIC BOTTLE ONLY GRAM NEGATIVE RODS CRITICAL RESULT CALLED TO, READ BACK BY AND VERIFIED WITH: TO LPOINTDEXTER(PHARD) BY TCLEVELAND 12/30/2015 AT 5:51AM Performed at [place] (A)  Final   Report Status 01/01/2016 FINAL  Final   Organism ID, Bacteria KLEBSIELLA PNEUMONIAE  Final      Susceptibility   Klebsiella pneumoniae - MIC*    AMPICILLIN >=32 RESISTANT Resistant     CEFAZOLIN <=4 SENSITIVE Sensitive     CEFEPIME <=1 SENSITIVE Sensitive     CEFTAZIDIME <=1 SENSITIVE Sensitive     CEFTRIAXONE <=1 SENSITIVE Sensitive     CIPROFLOXACIN <=0.25 SENSITIVE Sensitive     GENTAMICIN <=1 SENSITIVE Sensitive     IMIPENEM <=0.25 SENSITIVE Sensitive     TRIMETH/SULFA <=20 SENSITIVE Sensitive     AMPICILLIN/SULBACTAM 4 SENSITIVE Sensitive     PIP/TAZO <=4 SENSITIVE Sensitive     Extended ESBL NEGATIVE Sensitive     * KLEBSIELLA PNEUMONIAE  Blood Culture ID Panel (Reflexed)     Status: Abnormal   Collection Time: 12/29/15  7:48 AM  Result Value Ref Range Status   Enterococcus species NOT DETECTED NOT DETECTED Final   Vancomycin resistance NOT DETECTED NOT DETECTED Final   Listeria monocytogenes NOT DETECTED NOT DETECTED Final   Staphylococcus species NOT DETECTED NOT DETECTED Final   Staphylococcus aureus NOT DETECTED NOT DETECTED Final   Methicillin resistance NOT DETECTED NOT DETECTED Final   Streptococcus species NOT DETECTED NOT DETECTED Final   Streptococcus agalactiae NOT DETECTED NOT DETECTED Final   Streptococcus pneumoniae NOT DETECTED NOT  DETECTED Final   Streptococcus pyogenes NOT DETECTED NOT DETECTED Final   Acinetobacter baumannii NOT DETECTED NOT DETECTED Final   Enterobacteriaceae species DETECTED (A) NOT DETECTED Final    Comment: CRITICAL RESULT CALLED TO, READ BACK BY AND VERIFIED WITH: TO LPOINTDEXTER(PHARD) BY TCLEVELAND 12/30/2015 AT 5:51AM    Enterobacter cloacae complex NOT DETECTED NOT DETECTED Final   Escherichia coli NOT DETECTED NOT DETECTED Final   Klebsiella oxytoca NOT DETECTED NOT DETECTED Final   Klebsiella pneumoniae DETECTED (A) NOT DETECTED Final    Comment: CRITICAL RESULT CALLED TO, READ BACK BY AND VERIFIED WITH: TO LPOINTDEXTER(PHARD) BY TCLEVELAND  12/30/2015 AT 5:51AM    Proteus species NOT DETECTED NOT DETECTED Final   Serratia marcescens NOT DETECTED NOT DETECTED Final   Carbapenem resistance NOT DETECTED NOT DETECTED Final   Haemophilus influenzae NOT DETECTED NOT DETECTED Final   Neisseria meningitidis NOT DETECTED NOT DETECTED Final   Pseudomonas aeruginosa NOT DETECTED NOT DETECTED Final   Candida albicans NOT DETECTED NOT DETECTED Final   Candida glabrata NOT DETECTED NOT DETECTED Final   Candida krusei NOT DETECTED NOT DETECTED Final   Candida parapsilosis NOT DETECTED NOT DETECTED Final   Candida tropicalis NOT DETECTED NOT DETECTED Final    Comment: Performed at [facility]  Culture, blood (Routine X 2) w Reflex to ID Panel     Status: None (Preliminary result)   Collection Time: 12/29/15  7:55 AM  Result Value Ref Range Status   Specimen Description BLOOD BLOOD RIGHT FOREARM  Final   Special Requests BOTTLES DRAWN AEROBIC AND ANAEROBIC 5CC EACH  Final   Culture  Setup Time   Final    GRAM VARIABLE ROD AEROBIC BOTTLE ONLY CRITICAL RESULT CALLED TO, READ BACK BY AND VERIFIED WITH: J. LEGG,PHARM AT [id number] ON [id number] BY Rhea Bleacher    Culture   Final    TOO YOUNG TO READ Performed at [facility]    Report Status PENDING  Incomplete  Blood Culture ID Panel  (Reflexed)     Status: Abnormal   Collection Time: 12/29/15  7:55 AM  Result Value Ref Range Status   Enterococcus species NOT DETECTED NOT DETECTED Final   Listeria monocytogenes NOT DETECTED NOT DETECTED Final   Staphylococcus species NOT DETECTED NOT DETECTED Final   Staphylococcus aureus NOT DETECTED NOT DETECTED Final   Streptococcus species NOT DETECTED NOT DETECTED Final   Streptococcus agalactiae NOT DETECTED NOT DETECTED Final   Streptococcus pneumoniae NOT DETECTED NOT DETECTED Final   Streptococcus pyogenes NOT DETECTED NOT DETECTED Final   Acinetobacter baumannii NOT DETECTED NOT DETECTED Final   Enterobacteriaceae species DETECTED (A) NOT DETECTED Final    Comment: CRITICAL RESULT CALLED TO, READ BACK BY AND VERIFIED WITH: J. LEGG, PHARM AT [id number] ON [id number] BY S. YARBROUGH    Enterobacter cloacae complex NOT DETECTED NOT DETECTED Final   Escherichia coli NOT DETECTED NOT DETECTED Final   Klebsiella oxytoca NOT DETECTED NOT DETECTED Final   Klebsiella pneumoniae DETECTED (A) NOT DETECTED Final    Comment: CRITICAL RESULT CALLED TO, READ BACK BY AND VERIFIED WITH: J. LEGG, [place] [id number] ON [id number] BY S. YARBROUGH    Proteus species NOT DETECTED NOT DETECTED Final   Serratia marcescens NOT DETECTED NOT DETECTED Final   Carbapenem resistance NOT DETECTED NOT DETECTED Final   Haemophilus influenzae NOT DETECTED NOT DETECTED Final   Neisseria meningitidis NOT DETECTED NOT DETECTED Final   Pseudomonas aeruginosa NOT DETECTED NOT DETECTED Final   Candida albicans NOT DETECTED NOT DETECTED Final   Candida glabrata NOT DETECTED NOT DETECTED Final   Candida krusei NOT DETECTED NOT DETECTED Final   Candida parapsilosis NOT DETECTED NOT DETECTED Final   Candida tropicalis NOT DETECTED NOT DETECTED Final    Comment: Performed at [facility]  Urine culture     Status: None   Collection Time: 12/29/15  8:24 AM  Result Value Ref Range Status   Specimen Description URINE, RANDOM   Final   Special Requests NONE  Final   Culture NO GROWTH Performed at [facility]   Final   Report Status  12/30/2015 FINAL  Final     Radiology Studies: Dg Chest 2 View  Result Date: 01/01/2016 CLINICAL DATA:  65 year old male with increasing chest pain and shortness of breath EXAM: CHEST  2 VIEW COMPARISON:  Chest radiograph dated 12/31/2015 and chest CT dated 08/04/2008 FINDINGS: There is mild cardiomegaly with mild increased vascular prominence compatible with congestive changes. Left lung base linear densities may represent atelectatic changes/ scarring versus less likely developing pneumonia. Clinical correlation is recommended. No focal consolidation, pleural effusion, or pneumothorax. Right glenoid surgical pain no acute osseous pathology. Right upper quadrant surgical clips. IMPRESSION: Cardiomegaly with mild vascular congestion. Left lung base linear atelectasis/ scarring versus less likely developing infiltrate. Clinical correlation and follow-up recommended. Electronically Signed   By: Anner Crete M.D.   On: 01/01/2016 00:59   Dg Chest 2 View  Result Date: 12/31/2015 CLINICAL DATA:  Sob, cough and fever x 1 week.  nonsmoker EXAM: CHEST  2 VIEW COMPARISON:  12/29/2015 FINDINGS: Moderate thoracic spondylosis. Surgical changes about the right glenohumeral joint. Midline trachea. Mild cardiomegaly. Mediastinal contours otherwise within normal limits. No pleural effusion or pneumothorax. Patchy left base pulmonary opacity. Surgical clips in the right upper quadrant. IMPRESSION: Patchy left base pulmonary opacities which are favored to represent areas of atelectasis. Early or mild pneumonia cannot be excluded. If symptoms persist, radiographic follow-up in 2-4 days may be informative. Electronically Signed   By: Abigail Miyamoto M.D.   On: 12/31/2015 08:25   Ct Abdomen Pelvis W Contrast  Result Date: 12/30/2015 CLINICAL DATA:  65 year old male with history of fever and recurrent  urinary tract infection. History of HIV. History of hemochromatosis. EXAM: CT ABDOMEN AND PELVIS WITH CONTRAST TECHNIQUE: Multidetector CT imaging of the abdomen and pelvis was performed using the standard protocol following bolus administration of intravenous contrast. CONTRAST:  48mL ISOVUE-300 IOPAMIDOL (ISOVUE-300) INJECTION 61%, 2mL ISOVUE-300 IOPAMIDOL (ISOVUE-300) INJECTION 61% COMPARISON:  CT the abdomen and pelvis 10/22/2007. FINDINGS: Lower chest: Mild scarring throughout the visualize lung bases. Hepatobiliary: The liver has a shrunken appearance and nodular contour, suggestive of cirrhosis. No discrete cystic or solid hepatic lesions. No intra or extrahepatic biliary ductal dilatation. Status post cholecystectomy. Pancreas: No pancreatic mass. No pancreatic ductal dilatation. No pancreatic or peripancreatic fluid or inflammatory changes. Spleen: Spleen is enlarged measuring 15.6 x 5.6 x 16.4 cm (estimated splenic volume of 716 mL). Small calcified granuloma in the superior aspect of the spleen. Adrenals/Urinary Tract: Heterogeneous areas of hypoenhancement in the left kidney with surrounding perinephric stranding, concerning for left-sided pyelonephritis. Right kidney is normal in appearance. Mild left hydroureter. No right-sided hydroureter. Urinary bladder is normal in appearance. Surgical clips above the right kidney, presumably from prior right adrenalectomy. Left adrenal gland is normal in appearance. Stomach/Bowel: The appearance of the stomach is normal. There is no pathologic dilatation of small bowel or colon. The appendix is not confidently identified and may be surgically absent. Regardless, there are no inflammatory changes noted adjacent to the cecum to suggest the presence of an acute appendicitis at this time. Vascular/Lymphatic: Aortic atherosclerosis, without evidence of aneurysm in the abdominal or pelvic vasculature. No lymphadenopathy noted in the abdomen or pelvis. Reproductive:  Prostate gland and seminal vesicles are unremarkable in appearance. Other: No significant volume of ascites.  No pneumoperitoneum. Musculoskeletal: There are no aggressive appearing lytic or blastic lesions noted in the visualized portions of the skeleton. IMPRESSION: 1. Findings are concerning for uncomplicated left-sided pyelonephritis. Correlation with urinalysis is recommended. 2. Morphologic changes in the liver compatible  with underlying cirrhosis. 3. Splenomegaly. Electronically Signed   By: Vinnie Langton M.D.   On: 12/30/2015 17:47    Scheduled Meds: . allopurinol  300 mg Oral Daily  . amLODipine  10 mg Oral Daily  . carvedilol  25 mg Oral Daily  . cefTRIAXone (ROCEPHIN)  IV  2 g Intravenous Q24H  . clomiPHENE  50 mg Oral Daily  . emtricitabine-rilpivir-tenofovir AF  1 tablet Oral Q breakfast  . enoxaparin (LOVENOX) injection  40 mg Subcutaneous Daily  . Icosapent Ethyl  1 g Oral BID  . insulin aspart  0-15 Units Subcutaneous TID WC  . insulin aspart  0-5 Units Subcutaneous QHS  . insulin aspart  3 Units Subcutaneous TID WC  . insulin glargine  12 Units Subcutaneous QHS  . irbesartan  300 mg Oral Daily  . linagliptin  5 mg Oral Daily  . oxyCODONE-acetaminophen  2 tablet Oral Once   Continuous Infusions:     LOS: 3 days   Time spent: 25 minutes.  Vance Gather, MD [facility] Pager [phone]  If 7PM-7AM, please contact night-coverage [email or web address] Password [email or web address] 01/01/2016, 11:42 AM

## 2016-01-01 NOTE — Progress Notes (Signed)
Inpatient Diabetes Program Recommendations  AACE/ADA: New Consensus Statement on Inpatient Glycemic Control (2015)  Target Ranges:  Prepandial:   less than 140 mg/dL      Peak postprandial:   less than 180 mg/dL (1-2 hours)      Critically ill patients:  140 - 180 mg/dL   Lab Results  Component Value Date   GLUCAP 222 (H) 01/01/2016   HGBA1C 6.1 09/13/2014    Review of Glycemic Control  4th day of am blood sugars > 200 mg/dL. Would likely benefit from starting basal insulin at home. Need updated HgbA1C to assess glycemic control prior to hospitalization.  Inpatient Diabetes Program Recommendations:    Increase Lantus to 20 units QHS Increase Novolog to 5 units tidwc  If pt is to go home on insulin, will need affordable insurance since pt does not have insurance.   Will continue to follow.  Thank you. Lorenda Peck, RD, LDN, CDE Inpatient Diabetes Coordinator [phone]

## 2016-01-01 NOTE — Progress Notes (Signed)
CRITICAL VALUE ALERT  Critical value received: TROPONIN 0.03  Date of notification: 01/01/16  Time of notification:  [id number]  Critical value read back:YES  Nurse who received alert: Lottie Dawson  MD notified (1st page):  DR Bonner Puna  Time of first page:  1345  MD notified (2nd page):  Time of second page:  Responding MD: DR Bonner Puna  Time MD responded:  1350

## 2016-01-01 NOTE — Care Management Note (Signed)
Case Management Note  Patient Details  Name: [name] MRN: [id number] Date of Birth: 08-Jan-1951  Subjective/Objective:   65 yo admitted with Sepsis. Hx of HIV.                 Action/Plan: Pt from home alone and is a current pt at [facility]. Chart reviewed and CM following for discharge needs.  Expected Discharge Date:                  Expected Discharge Plan:  Home/Self Care  In-House Referral:     Discharge planning Services  CM Consult  Post Acute Care Choice:    Choice offered to:     DME Arranged:    DME Agency:     HH Arranged:    HH Agency:     Status of Service:  In process, will continue to follow  If discussed at Long Length of Stay Meetings, dates discussed:    Additional CommentsLynnell Catalan, RN 01/01/2016, 11:39 AM  [phone]

## 2016-01-01 NOTE — Progress Notes (Signed)
Pt asleep.wbb 

## 2016-01-02 LAB — CBC
HCT: 31.2 % — ABNORMAL LOW (ref 39.0–52.0)
Hemoglobin: 10.2 g/dL — ABNORMAL LOW (ref 13.0–17.0)
MCH: 33.2 pg (ref 26.0–34.0)
MCHC: 32.7 g/dL (ref 30.0–36.0)
MCV: 101.6 fL — ABNORMAL HIGH (ref 78.0–100.0)
Platelets: 205 10*3/uL (ref 150–400)
RBC: 3.07 MIL/uL — ABNORMAL LOW (ref 4.22–5.81)
RDW: 15 % (ref 11.5–15.5)
WBC: 12 10*3/uL — ABNORMAL HIGH (ref 4.0–10.5)

## 2016-01-02 LAB — GLUCOSE, CAPILLARY
Glucose-Capillary: 179 mg/dL — ABNORMAL HIGH (ref 65–99)
Glucose-Capillary: 202 mg/dL — ABNORMAL HIGH (ref 65–99)
Glucose-Capillary: 107 mg/dL — ABNORMAL HIGH (ref 65–99)
Glucose-Capillary: 180 mg/dL — ABNORMAL HIGH (ref 65–99)

## 2016-01-02 LAB — T-HELPER CELLS (CD4) COUNT (NOT AT ARMC)
CD4 % Helper T Cell: 23 % — ABNORMAL LOW (ref 33–55)
CD4 T Cell Abs: 600 /uL (ref 400–2700)

## 2016-01-02 LAB — BASIC METABOLIC PANEL
Anion gap: 6 (ref 5–15)
BUN: 18 mg/dL (ref 6–20)
CO2: 22 mmol/L (ref 22–32)
Calcium: 8.3 mg/dL — ABNORMAL LOW (ref 8.9–10.3)
Chloride: 110 mmol/L (ref 101–111)
Creatinine, Ser: 1.19 mg/dL (ref 0.61–1.24)
GFR calc Af Amer: >60 mL/min (ref >60)
GFR calc non Af Amer: >60 mL/min (ref >60)
Glucose, Bld: 188 mg/dL — ABNORMAL HIGH (ref 65–99)
Potassium: 4.2 mmol/L (ref 3.5–5.1)
Sodium: 138 mmol/L (ref 135–145)

## 2016-01-02 MED ORDER — FUROSEMIDE 10 MG/ML IJ SOLN
40.0000 mg | Freq: Once | INTRAMUSCULAR | Status: AC
  Administered 2016-01-02: 40 mg via INTRAVENOUS
  Filled 2016-01-02: qty 4

## 2016-01-02 NOTE — Progress Notes (Signed)
PROGRESS NOTE    [name]  [id number] DOB: 1950-11-04 DOA: 12/29/2015 PCP: [name], MD    Brief Narrative:  65 yo male with a history of well-controlled HIV, and hemochromatosis with NIDDM and hypogonadism who presented to the ED 10/21 due to fever and symptoms of recurrent UTI.   He had been treated for UTI with cipro with resolution of urinary urgency/frequency but had return of symptoms 3 days PTA and noted worsening fever and shaking chills since that time. He also endorses left flank pain. On arrival HR was 120s, RR 30/min, abdomen was benign, and he felt subjectively improved after IV fluids. Urinalysis showed significant glucosuria, granular casts, rare bacteria, 6-30 WBCs, nitrite negative. CXR showed vascular congestion and mild cardiomegaly with atelectasis. Bicarb 18, creatinine 1.38, BNP 211. He was admitted for treatment of sepsis due to presumed urinary source. 1 of 2 blood cultures grew Klebsiella pneumoniae and rocephin was continue  Assessment & Plan:   Principal Problem:   Sepsis ([place]) Active Problems:   Human immunodeficiency virus (HIV) disease ([place])   Diabetes mellitus type 2 with complications ([place])   Disorder of bilirubin excretion   Essential hypertension   HEMOCHROMATOSIS, HX OF   Hyperlipidemia   Hypogonadism male   Hypoxia   Pulmonary edema   UTI (urinary tract infection)   Sepsis due to pyelonephritis:  -Patient with recurrent UTI and Klebsiella bacteremia  -CT abdomen with contrast: perinephric stranding consistent with pyelonephritis. No renal stone. -We'll continue antibiotics for now -Check CBC in the morning  Acute respiratory failure:  -Recently noted to be hypoxemic requiring O2 -Chest x-ray personally reviewed by myself. Patient noted to have ulnar vascular congestion. -Recent 2-D echocardiogram with normal LVEF -We'll give a trial of IV Lasix -Wean O2 as tolerated  Human immunodeficiency virus (HIV) disease:  -Patient  compliant with medications  -Viral load reportedly nondetectable for the past several years  -Most recent CD4 count was noted to be 810  -Continue antiviral medications   Diabetes mellitus type 2 with complications:  -Glucose stable -Most recent hemoglobin A1c noted to be 6.1 -Continue Lantus 12 units, 3 units of pre-meal insulin 3 times a day -Appreciate input by diabetic coordinator  Stage III CKD:  -Creatinine stable  -Patient is followed by Dr. Marval Regal as an outpatient  -Repeat basic metabolic panel morning  Mild acidosis:  -Normal this morning -Repeat basic metabolic panel morning  Gilbert's syndrome:  -Total bilirubin noted to be 2.9 at last check.  -Will continue to monitor  Essential hypertension -We'll continue amlodipine, R, Coreg as tolerated  Hemochromatosis:  -Stable at present -Continue to monitor  Hypertriglyceridemia - We'll continue Icosapent  Hypogonadism:  -Likely secondary to hemochromatosis -We'll continue clomiphene  Gout -Stable at present -Patient is continued on colchicine and allopurinol  Erectile dysfunction: - Sildenafil on hold  DVT prophylaxis: lovenox Code Status: Full Family Communication: Pt in room, family not at bedside Disposition Plan: Uncertain at this time  Consultants:   Infectious disease via phone  Procedures:     Antimicrobials: Anti-infectives    Start     Dose/Rate Route Frequency Ordered Stop   12/30/15 1500  cefTRIAXone (ROCEPHIN) 2 g in dextrose 5 % 50 mL IVPB     2 g 100 mL/hr over 30 Minutes Intravenous Every 24 hours 12/30/15 0610     12/30/15 0800  emtricitabine-rilpivir-tenofovir AF (ODEFSEY) 200-25-25 MG per tablet 1 tablet     1 tablet Oral Daily with breakfast 12/29/15 1136  12/29/15 1215  cefTRIAXone (ROCEPHIN) 1 g in dextrose 5 % 50 mL IVPB  Status:  Discontinued     1 g 100 mL/hr over 30 Minutes Intravenous Every 24 hours 12/29/15 1201 12/30/15 0609        Subjective: Complains of shortness of breath worse laying down, improved sitting up  Objective: Vitals:   01/01/16 1422 01/01/16 2208 01/02/16 0449 01/02/16 1605  BP: 129/63 (!) 152/72 (!) 121/59 137/63  Pulse: 86 93 82 85  Resp: 16 16 16 18   Temp: 99.4 F (37.4 C) 99.3 F (37.4 C) 98.6 F (37 C) 98.4 F (36.9 C)  TempSrc: Oral Oral Oral Oral  SpO2: 98% 96% 95% 95%  Weight:      Height:        Intake/Output Summary (Last 24 hours) at 01/02/16 1807 Last data filed at 01/02/16 1607  Gross per 24 hour  Intake              600 ml  Output             3100 ml  Net            -2500 ml   Filed Weights   12/29/15 0656 12/29/15 1357  Weight: 93 kg (205 lb) 97.3 kg (214 lb 8 oz)    Examination:  General exam: Appears calm and comfortable  Respiratory system: Clear to auscultation. Mild increased respiratory effort, no audible wheezing Cardiovascular system: S1 & S2 heard, RRR. Gastrointestinal system: Abdomen is nondistended, soft and nontender. No organomegaly or masses felt. Normal bowel sounds heard. Central nervous system: Alert and oriented. No focal neurological deficits. Extremities: Symmetric 5 x 5 power. Skin: No rashes, lesions or ulcers Psychiatry: Judgement and insight appear normal. Mood & affect appropriate.   Data Reviewed: I have personally reviewed following labs and imaging studies  CBC:  Recent Labs Lab 12/29/15 0755 12/30/15 0431 12/31/15 0344 01/01/16 0356 01/02/16 0411  WBC 6.8 8.8 12.1* 11.7* 12.0*  NEUTROABS 5.7  --   --   --   --   HGB 11.9* 11.9* 10.8* 10.7* 10.2*  HCT 34.4* 35.4* 32.3* 32.4* 31.2*  MCV 98.3 100.0 100.6* 101.3* 101.6*  PLT 138* 145* 169 190 [id number]   Basic Metabolic Panel:  Recent Labs Lab 12/29/15 0955 12/30/15 0431 12/31/15 0344 01/01/16 0356 01/02/16 0411  NA 133* 134* 135 137 138  K 4.1 3.5 3.8 3.8 4.2  CL 105 105 107 110 110  CO2 18* 17* 19* 20* 22  GLUCOSE 273* 237* 239* 225* 188*  BUN 21* 26* 20 21*  18  CREATININE 1.38* 1.43* 1.18 1.16 1.19  CALCIUM 8.2* 8.5* 8.3* 8.1* 8.3*   GFR: Estimated Creatinine Clearance: 73.1 mL/min (by C-G formula based on SCr of 1.19 mg/dL). Liver Function Tests:  Recent Labs Lab 12/29/15 0955  AST 35  ALT 14*  ALKPHOS 123  BILITOT 2.9*  PROT 6.5  ALBUMIN 2.8*   No results for input(s): LIPASE, AMYLASE in the last 168 hours. No results for input(s): AMMONIA in the last 168 hours. Coagulation Profile: No results for input(s): INR, PROTIME in the last 168 hours. Cardiac Enzymes:  Recent Labs Lab 01/01/16 1235  TROPONINI 0.03*   BNP (last 3 results) No results for input(s): PROBNP in the last 8760 hours. HbA1C: No results for input(s): HGBA1C in the last 72 hours. CBG:  Recent Labs Lab 01/01/16 1818 01/01/16 2209 01/02/16 0810 01/02/16 1208 01/02/16 1700  GLUCAP 157* 163* 179* 202* 107*  Lipid Profile: No results for input(s): CHOL, HDL, LDLCALC, TRIG, CHOLHDL, LDLDIRECT in the last 72 hours. Thyroid Function Tests: No results for input(s): TSH, T4TOTAL, FREET4, T3FREE, THYROIDAB in the last 72 hours. Anemia Panel: No results for input(s): VITAMINB12, FOLATE, FERRITIN, TIBC, IRON, RETICCTPCT in the last 72 hours. Sepsis Labs:  Recent Labs Lab 12/29/15 0755  LATICACIDVEN 1.86    Recent Results (from the past 240 hour(s))  Culture, blood (Routine X 2) w Reflex to ID Panel     Status: Abnormal   Collection Time: 12/29/15  7:48 AM  Result Value Ref Range Status   Specimen Description BLOOD BLOOD LEFT FOREARM  Final   Special Requests BOTTLES DRAWN AEROBIC AND ANAEROBIC 5 CCE Laplace  Final   Culture  Setup Time   Final    AEROBIC BOTTLE ONLY GRAM NEGATIVE RODS CRITICAL RESULT CALLED TO, READ BACK BY AND VERIFIED WITH: TO LPOINTDEXTER(PHARD) BY TCLEVELAND 12/30/2015 AT 5:51AM Performed at [place] (A)  Final   Report Status 01/01/2016 FINAL  Final   Organism ID, Bacteria KLEBSIELLA  PNEUMONIAE  Final      Susceptibility   Klebsiella pneumoniae - MIC*    AMPICILLIN >=32 RESISTANT Resistant     CEFAZOLIN <=4 SENSITIVE Sensitive     CEFEPIME <=1 SENSITIVE Sensitive     CEFTAZIDIME <=1 SENSITIVE Sensitive     CEFTRIAXONE <=1 SENSITIVE Sensitive     CIPROFLOXACIN <=0.25 SENSITIVE Sensitive     GENTAMICIN <=1 SENSITIVE Sensitive     IMIPENEM <=0.25 SENSITIVE Sensitive     TRIMETH/SULFA <=20 SENSITIVE Sensitive     AMPICILLIN/SULBACTAM 4 SENSITIVE Sensitive     PIP/TAZO <=4 SENSITIVE Sensitive     Extended ESBL NEGATIVE Sensitive     * KLEBSIELLA PNEUMONIAE  Blood Culture ID Panel (Reflexed)     Status: Abnormal   Collection Time: 12/29/15  7:48 AM  Result Value Ref Range Status   Enterococcus species NOT DETECTED NOT DETECTED Final   Vancomycin resistance NOT DETECTED NOT DETECTED Final   Listeria monocytogenes NOT DETECTED NOT DETECTED Final   Staphylococcus species NOT DETECTED NOT DETECTED Final   Staphylococcus aureus NOT DETECTED NOT DETECTED Final   Methicillin resistance NOT DETECTED NOT DETECTED Final   Streptococcus species NOT DETECTED NOT DETECTED Final   Streptococcus agalactiae NOT DETECTED NOT DETECTED Final   Streptococcus pneumoniae NOT DETECTED NOT DETECTED Final   Streptococcus pyogenes NOT DETECTED NOT DETECTED Final   Acinetobacter baumannii NOT DETECTED NOT DETECTED Final   Enterobacteriaceae species DETECTED (A) NOT DETECTED Final    Comment: CRITICAL RESULT CALLED TO, READ BACK BY AND VERIFIED WITH: TO LPOINTDEXTER(PHARD) BY TCLEVELAND 12/30/2015 AT 5:51AM    Enterobacter cloacae complex NOT DETECTED NOT DETECTED Final   Escherichia coli NOT DETECTED NOT DETECTED Final   Klebsiella oxytoca NOT DETECTED NOT DETECTED Final   Klebsiella pneumoniae DETECTED (A) NOT DETECTED Final    Comment: CRITICAL RESULT CALLED TO, READ BACK BY AND VERIFIED WITH: TO LPOINTDEXTER(PHARD) BY TCLEVELAND 12/30/2015 AT 5:51AM    Proteus species NOT DETECTED NOT  DETECTED Final   Serratia marcescens NOT DETECTED NOT DETECTED Final   Carbapenem resistance NOT DETECTED NOT DETECTED Final   Haemophilus influenzae NOT DETECTED NOT DETECTED Final   Neisseria meningitidis NOT DETECTED NOT DETECTED Final   Pseudomonas aeruginosa NOT DETECTED NOT DETECTED Final   Candida albicans NOT DETECTED NOT DETECTED Final   Candida glabrata NOT DETECTED NOT DETECTED Final   Candida krusei  NOT DETECTED NOT DETECTED Final   Candida parapsilosis NOT DETECTED NOT DETECTED Final   Candida tropicalis NOT DETECTED NOT DETECTED Final    Comment: Performed at [facility]  Culture, blood (Routine X 2) w Reflex to ID Panel     Status: Abnormal (Preliminary result)   Collection Time: 12/29/15  7:55 AM  Result Value Ref Range Status   Specimen Description BLOOD BLOOD RIGHT FOREARM  Final   Special Requests BOTTLES DRAWN AEROBIC AND ANAEROBIC 5CC EACH  Final   Culture  Setup Time   Final    GRAM VARIABLE ROD AEROBIC BOTTLE ONLY CRITICAL RESULT CALLED TO, READ BACK BY AND VERIFIED WITH: J. LEGG,PHARM AT [id number] ON [id number] BY Rhea Bleacher Performed at [place] (A)  Final   Report Status PENDING  Incomplete  Blood Culture ID Panel (Reflexed)     Status: Abnormal   Collection Time: 12/29/15  7:55 AM  Result Value Ref Range Status   Enterococcus species NOT DETECTED NOT DETECTED Final   Listeria monocytogenes NOT DETECTED NOT DETECTED Final   Staphylococcus species NOT DETECTED NOT DETECTED Final   Staphylococcus aureus NOT DETECTED NOT DETECTED Final   Streptococcus species NOT DETECTED NOT DETECTED Final   Streptococcus agalactiae NOT DETECTED NOT DETECTED Final   Streptococcus pneumoniae NOT DETECTED NOT DETECTED Final   Streptococcus pyogenes NOT DETECTED NOT DETECTED Final   Acinetobacter baumannii NOT DETECTED NOT DETECTED Final   Enterobacteriaceae species DETECTED (A) NOT DETECTED Final    Comment: CRITICAL RESULT  CALLED TO, READ BACK BY AND VERIFIED WITH: J. LEGG, PHARM AT [id number] ON [id number] BY S. YARBROUGH    Enterobacter cloacae complex NOT DETECTED NOT DETECTED Final   Escherichia coli NOT DETECTED NOT DETECTED Final   Klebsiella oxytoca NOT DETECTED NOT DETECTED Final   Klebsiella pneumoniae DETECTED (A) NOT DETECTED Final    Comment: CRITICAL RESULT CALLED TO, READ BACK BY AND VERIFIED WITH: J. LEGG, PHARM AT 1042 ON [id number] BY S. YARBROUGH    Proteus species NOT DETECTED NOT DETECTED Final   Serratia marcescens NOT DETECTED NOT DETECTED Final   Carbapenem resistance NOT DETECTED NOT DETECTED Final   Haemophilus influenzae NOT DETECTED NOT DETECTED Final   Neisseria meningitidis NOT DETECTED NOT DETECTED Final   Pseudomonas aeruginosa NOT DETECTED NOT DETECTED Final   Candida albicans NOT DETECTED NOT DETECTED Final   Candida glabrata NOT DETECTED NOT DETECTED Final   Candida krusei NOT DETECTED NOT DETECTED Final   Candida parapsilosis NOT DETECTED NOT DETECTED Final   Candida tropicalis NOT DETECTED NOT DETECTED Final    Comment: Performed at [facility]  Urine culture     Status: None   Collection Time: 12/29/15  8:24 AM  Result Value Ref Range Status   Specimen Description URINE, RANDOM  Final   Special Requests NONE  Final   Culture NO GROWTH Performed at [facility]   Final   Report Status 12/30/2015 FINAL  Final     Radiology Studies: Dg Chest 2 View  Result Date: 01/01/2016 CLINICAL DATA:  65 year old male with increasing chest pain and shortness of breath EXAM: CHEST  2 VIEW COMPARISON:  Chest radiograph dated 12/31/2015 and chest CT dated 08/04/2008 FINDINGS: There is mild cardiomegaly with mild increased vascular prominence compatible with congestive changes. Left lung base linear densities may represent atelectatic changes/ scarring versus less likely developing pneumonia. Clinical correlation is recommended. No focal consolidation, pleural effusion, or  pneumothorax. Right glenoid surgical pain no acute osseous pathology. Right upper quadrant surgical clips. IMPRESSION: Cardiomegaly with mild vascular congestion. Left lung base linear atelectasis/ scarring versus less likely developing infiltrate. Clinical correlation and follow-up recommended. Electronically Signed   By: Anner Crete M.D.   On: 01/01/2016 00:59   Ct Angio Chest Pe W Or Wo Contrast  Result Date: 01/01/2016 CLINICAL DATA:  Left upper chest pain with deep breath. Recent sepsis. EXAM: CT ANGIOGRAPHY CHEST WITH CONTRAST TECHNIQUE: Multidetector CT imaging of the chest was performed using the standard protocol during bolus administration of intravenous contrast. Multiplanar CT image reconstructions and MIPs were obtained to evaluate the vascular anatomy. CONTRAST:  100 cc Isovue 370 IV COMPARISON:  Chest x-ray 12/31/2015.  Chest CT 08/04/2008. FINDINGS: Cardiovascular: Heart is mildly enlarged. Aorta is normal caliber. No filling defects in the pulmonary arteries to suggest pulmonary emboli. Mediastinum/Nodes: No mediastinal, hilar, or axillary adenopathy. Lungs/Pleura: Trace bilateral pleural effusions. Dependent atelectasis posteriorly in the lower lobes. Lingular atelectasis or scarring. Upper Abdomen: Mildly nodular contours to the liver surface suggesting cirrhosis. Imaging into the upper abdomen shows no acute findings. Musculoskeletal: Chest wall soft tissues are unremarkable. No acute bony abnormality or focal bone lesion. Diffuse degenerative spurring throughout the thoracic spine. Review of the MIP images confirms the above findings. IMPRESSION: Mild cardiomegaly. Trace bilateral pleural effusions. Dependent atelectasis in the lower lobes. No evidence of pulmonary embolus. Changes of cirrhosis. Electronically Signed   By: Rolm Baptise M.D.   On: 01/01/2016 15:12    Scheduled Meds: . allopurinol  300 mg Oral Daily  . amLODipine  10 mg Oral Daily  . carvedilol  25 mg Oral Daily   . cefTRIAXone (ROCEPHIN)  IV  2 g Intravenous Q24H  . clomiPHENE  50 mg Oral Daily  . emtricitabine-rilpivir-tenofovir AF  1 tablet Oral Q breakfast  . enoxaparin (LOVENOX) injection  40 mg Subcutaneous Daily  . Icosapent Ethyl  1 g Oral BID  . insulin aspart  0-15 Units Subcutaneous TID WC  . insulin aspart  0-5 Units Subcutaneous QHS  . insulin aspart  5 Units Subcutaneous TID WC  . insulin glargine  20 Units Subcutaneous QHS  . irbesartan  300 mg Oral Daily  . linagliptin  5 mg Oral Daily   Continuous Infusions:    LOS: 4 days   CHIU, Orpah Melter, MD [facility] Pager [phone]  If 7PM-7AM, please contact night-coverage [email or web address] Password [email or web address] 01/02/2016, 6:07 PM

## 2016-01-03 DIAGNOSIS — N342 Other urethritis: Secondary | ICD-10-CM

## 2016-01-03 LAB — CBC
HCT: 33.5 % — ABNORMAL LOW (ref 39.0–52.0)
Hemoglobin: 11.2 g/dL — ABNORMAL LOW (ref 13.0–17.0)
MCH: 33.3 pg (ref 26.0–34.0)
MCHC: 33.4 g/dL (ref 30.0–36.0)
MCV: 99.7 fL (ref 78.0–100.0)
Platelets: 246 10*3/uL (ref 150–400)
RBC: 3.36 MIL/uL — ABNORMAL LOW (ref 4.22–5.81)
RDW: 14.5 % (ref 11.5–15.5)
WBC: 11.9 10*3/uL — ABNORMAL HIGH (ref 4.0–10.5)

## 2016-01-03 LAB — CULTURE, BLOOD (ROUTINE X 2)

## 2016-01-03 LAB — GLUCOSE, CAPILLARY
Glucose-Capillary: 196 mg/dL — ABNORMAL HIGH (ref 65–99)
Glucose-Capillary: 234 mg/dL — ABNORMAL HIGH (ref 65–99)
Glucose-Capillary: 146 mg/dL — ABNORMAL HIGH (ref 65–99)
Glucose-Capillary: 207 mg/dL — ABNORMAL HIGH (ref 65–99)

## 2016-01-03 MED ORDER — FLUTICASONE PROPIONATE 50 MCG/ACT NA SUSP
1.0000 | Freq: Every day | NASAL | Status: DC
  Administered 2016-01-03 – 2016-01-04 (×2): 1 via NASAL
  Filled 2016-01-03: qty 16

## 2016-01-03 MED ORDER — FUROSEMIDE 10 MG/ML IJ SOLN
40.0000 mg | Freq: Once | INTRAMUSCULAR | Status: AC
  Administered 2016-01-03: 40 mg via INTRAVENOUS
  Filled 2016-01-03: qty 4

## 2016-01-03 NOTE — Progress Notes (Signed)
PROGRESS NOTE    [name]  [id number] DOB: [birth year] DOA: 12/29/2015 PCP: [name], MD    Brief Narrative:  65 yo male with a history of well-controlled HIV, and hemochromatosis with NIDDM and hypogonadism who presented to the ED 10/21 due to fever and symptoms of recurrent UTI.   He had been treated for UTI with cipro with resolution of urinary urgency/frequency but had return of symptoms 3 days PTA and noted worsening fever and shaking chills since that time. He also endorses left flank pain. On arrival HR was 120s, RR 30/min, abdomen was benign, and he felt subjectively improved after IV fluids. Urinalysis showed significant glucosuria, granular casts, rare bacteria, 6-30 WBCs, nitrite negative. CXR showed vascular congestion and mild cardiomegaly with atelectasis. Bicarb 18, creatinine 1.38, BNP 211. He was admitted for treatment of sepsis due to presumed urinary source. 1 of 2 blood cultures grew Klebsiella pneumoniae and rocephin was continue  Assessment & Plan:   Principal Problem:   Sepsis ([place]) Active Problems:   Human immunodeficiency virus (HIV) disease ([place])   Diabetes mellitus type 2 with complications ([place])   Disorder of bilirubin excretion   Essential hypertension   HEMOCHROMATOSIS, HX OF   Hyperlipidemia   Hypogonadism male   Hypoxia   Pulmonary edema   UTI (urinary tract infection)   Sepsis due to pyelonephritis:  -Patient with recurrent UTI Klebsiella bacteremia -Patient currently continued on Rocephin. -Discussed with pharmacy. Recommendation for transitioned to ciprofloxacin at time of discharge to complete course of antibiotics -Patient remains afebrile -Leukocytosis remains unchanged. Will recheck CBC in the morning  Acute respiratory failure, possible acutely decompensated diastolic congestive heart failure, chronicity is unknown  -Patient recently noted to have increased O2 requirements  -Patient given 1 dose of IV Lasix on 01/02/2016 with  resultant 5 L urine output in 24 hours. -Patient has reported marked improvement in symptoms and is ambulating comfortably on room air presently -We'll give another dose of IV Lasix  Human immunodeficiency virus (HIV) disease:  -Patient reportedly compliant with his medications -Continue antiviral medications as tolerated -Viral load reportedly nondetectable for the past several years  -Most recent CD4 count was noted to be 810   Diabetes mellitus type 2 with complications:  -Blood glucose remains stable -Recent hemoglobin A1c from 09/13/2014 noted to be 6.1 -Patient is continued on Lantus 12 units, 3 units of pre-meal insulin 3 times a day  Stage III CKD:  -Renal function remains stable -Patient reports being followed by Dr.Coladonato as an outpatient  -We'll repeat basic metabolic panel the morning  Mild acidosis:  -Resolved -Follow-up on basic metabolic panel the morning per above  Gilbert's syndrome:  -Most recent total bilirubin noted be 2.9 -Continue to monitor  Essential hypertension -Blood pressure currently stable and well-controlled -Continue amlodipine, Coreg, ARB as tolerated  Hemochromatosis:  -Appear stable at present -We'll continue to monitor  Hypertriglyceridemia -Continue Icosapent as tolerated  Hypogonadism:  -Suspect secondary to hemochromatosis -Patient to continue clomiphene  Gout -Remains stable with no signs of acute gout flare -Continue allopurinol colchicine as tolerated l  Erectile dysfunction: - Sildenafil remains on hold for the time being  DVT prophylaxis: lovenox Code Status: Full Family Communication: Pt in room, , patient's mother at bedside Disposition Plan: Uncertain at this time  Consultants:   Infectious disease via phone  Procedures:     Antimicrobials: Anti-infectives    Start     Dose/Rate Route Frequency Ordered Stop   12/30/15 1500  cefTRIAXone (ROCEPHIN) 2 g  in dextrose 5 % 50 mL IVPB     2 g 100  mL/hr over 30 Minutes Intravenous Every 24 hours 12/30/15 0610     12/30/15 0800  emtricitabine-rilpivir-tenofovir AF (ODEFSEY) 200-25-25 MG per tablet 1 tablet     1 tablet Oral Daily with breakfast 12/29/15 1136     12/29/15 1215  cefTRIAXone (ROCEPHIN) 1 g in dextrose 5 % 50 mL IVPB  Status:  Discontinued     1 g 100 mL/hr over 30 Minutes Intravenous Every 24 hours 12/29/15 1201 12/30/15 0609      Subjective: Reports feeling much improved after receiving Lasix and is breathing more comfortably  Objective: Vitals:   01/02/16 1605 01/02/16 2104 01/03/16 0506 01/03/16 1530  BP: 137/63 (!) 141/69 126/61 120/61  Pulse: 85 85 95 83  Resp: 18 18 18 18   Temp: 98.4 F (36.9 C) 99.8 F (37.7 C) 99.8 F (37.7 C) 98.1 F (36.7 C)  TempSrc: Oral Oral Oral Oral  SpO2: 95% 92% 95% 97%  Weight:      Height:        Intake/Output Summary (Last 24 hours) at 01/03/16 1650 Last data filed at 01/03/16 1554  Gross per 24 hour  Intake              750 ml  Output             4200 ml  Net            -3450 ml   Filed Weights   12/29/15 0656 12/29/15 1357  Weight: 93 kg (205 lb) 97.3 kg (214 lb 8 oz)    Examination:  General exam: Ambulating to bathroom on room air, conversant Respiratory system: Clear to auscultation, normal respiratory effort, speaking full sentences Cardiovascular system: Regular rate, S1-S2 Gastrointestinal system: Obese, nondistended, nontender Central nervous system: CN II through XII grossly intact, strength intact throughout Extremities: Perfused, no clubbing Skin: Normal skin turgor, no notable skin lesions seen Psychiatry: Normal mood, no visual hallucinations  Data Reviewed: I have personally reviewed following labs and imaging studies  CBC:  Recent Labs Lab 12/29/15 0755 12/30/15 0431 12/31/15 0344 01/01/16 0356 01/02/16 0411 01/03/16 0409  WBC 6.8 8.8 12.1* 11.7* 12.0* 11.9*  NEUTROABS 5.7  --   --   --   --   --   HGB 11.9* 11.9* 10.8* 10.7*  10.2* 11.2*  HCT 34.4* 35.4* 32.3* 32.4* 31.2* 33.5*  MCV 98.3 100.0 100.6* 101.3* 101.6* 99.7  PLT 138* 145* 169 190 205 0000000   Basic Metabolic Panel:  Recent Labs Lab 12/29/15 0955 12/30/15 0431 12/31/15 0344 01/01/16 0356 01/02/16 0411  NA 133* 134* 135 137 138  K 4.1 3.5 3.8 3.8 4.2  CL 105 105 107 110 110  CO2 18* 17* 19* 20* 22  GLUCOSE 273* 237* 239* 225* 188*  BUN 21* 26* 20 21* 18  CREATININE 1.38* 1.43* 1.18 1.16 1.19  CALCIUM 8.2* 8.5* 8.3* 8.1* 8.3*   GFR: Estimated Creatinine Clearance: 73.1 mL/min (by C-G formula based on SCr of 1.19 mg/dL). Liver Function Tests:  Recent Labs Lab 12/29/15 0955  AST 35  ALT 14*  ALKPHOS 123  BILITOT 2.9*  PROT 6.5  ALBUMIN 2.8*   No results for input(s): LIPASE, AMYLASE in the last 168 hours. No results for input(s): AMMONIA in the last 168 hours. Coagulation Profile: No results for input(s): INR, PROTIME in the last 168 hours. Cardiac Enzymes:  Recent Labs Lab 01/01/16 1235  TROPONINI 0.03*  BNP (last 3 results) No results for input(s): PROBNP in the last 8760 hours. HbA1C: No results for input(s): HGBA1C in the last 72 hours. CBG:  Recent Labs Lab 01/02/16 1208 01/02/16 1700 01/02/16 2158 01/03/16 0808 01/03/16 1227  GLUCAP 202* 107* 180* 196* 234*   Lipid Profile: No results for input(s): CHOL, HDL, LDLCALC, TRIG, CHOLHDL, LDLDIRECT in the last 72 hours. Thyroid Function Tests: No results for input(s): TSH, T4TOTAL, FREET4, T3FREE, THYROIDAB in the last 72 hours. Anemia Panel: No results for input(s): VITAMINB12, FOLATE, FERRITIN, TIBC, IRON, RETICCTPCT in the last 72 hours. Sepsis Labs:  Recent Labs Lab 12/29/15 0755  LATICACIDVEN 1.86    Recent Results (from the past 240 hour(s))  Culture, blood (Routine X 2) w Reflex to ID Panel     Status: Abnormal   Collection Time: 12/29/15  7:48 AM  Result Value Ref Range Status   Specimen Description BLOOD BLOOD LEFT FOREARM  Final   Special  Requests BOTTLES DRAWN AEROBIC AND ANAEROBIC 5 [facility] [place]  Final   Culture  Setup Time   Final    AEROBIC BOTTLE ONLY GRAM NEGATIVE RODS CRITICAL RESULT CALLED TO, READ BACK BY AND VERIFIED WITH: TO LPOINTDEXTER(PHARD) BY TCLEVELAND 12/30/2015 AT 5:51AM Performed at [place] (A)  Final   Report Status 01/01/2016 FINAL  Final   Organism ID, Bacteria KLEBSIELLA PNEUMONIAE  Final      Susceptibility   Klebsiella pneumoniae - MIC*    AMPICILLIN >=32 RESISTANT Resistant     CEFAZOLIN <=4 SENSITIVE Sensitive     CEFEPIME <=1 SENSITIVE Sensitive     CEFTAZIDIME <=1 SENSITIVE Sensitive     CEFTRIAXONE <=1 SENSITIVE Sensitive     CIPROFLOXACIN <=0.25 SENSITIVE Sensitive     GENTAMICIN <=1 SENSITIVE Sensitive     IMIPENEM <=0.25 SENSITIVE Sensitive     TRIMETH/SULFA <=20 SENSITIVE Sensitive     AMPICILLIN/SULBACTAM 4 SENSITIVE Sensitive     PIP/TAZO <=4 SENSITIVE Sensitive     Extended ESBL NEGATIVE Sensitive     * KLEBSIELLA PNEUMONIAE  Blood Culture ID Panel (Reflexed)     Status: Abnormal   Collection Time: 12/29/15  7:48 AM  Result Value Ref Range Status   Enterococcus species NOT DETECTED NOT DETECTED Final   Vancomycin resistance NOT DETECTED NOT DETECTED Final   Listeria monocytogenes NOT DETECTED NOT DETECTED Final   Staphylococcus species NOT DETECTED NOT DETECTED Final   Staphylococcus aureus NOT DETECTED NOT DETECTED Final   Methicillin resistance NOT DETECTED NOT DETECTED Final   Streptococcus species NOT DETECTED NOT DETECTED Final   Streptococcus agalactiae NOT DETECTED NOT DETECTED Final   Streptococcus pneumoniae NOT DETECTED NOT DETECTED Final   Streptococcus pyogenes NOT DETECTED NOT DETECTED Final   Acinetobacter baumannii NOT DETECTED NOT DETECTED Final   Enterobacteriaceae species DETECTED (A) NOT DETECTED Final    Comment: CRITICAL RESULT CALLED TO, READ BACK BY AND VERIFIED WITH: TO LPOINTDEXTER(PHARD) BY TCLEVELAND  12/30/2015 AT 5:51AM    Enterobacter cloacae complex NOT DETECTED NOT DETECTED Final   Escherichia coli NOT DETECTED NOT DETECTED Final   Klebsiella oxytoca NOT DETECTED NOT DETECTED Final   Klebsiella pneumoniae DETECTED (A) NOT DETECTED Final    Comment: CRITICAL RESULT CALLED TO, READ BACK BY AND VERIFIED WITH: TO LPOINTDEXTER(PHARD) BY TCLEVELAND 12/30/2015 AT 5:51AM    Proteus species NOT DETECTED NOT DETECTED Final   Serratia marcescens NOT DETECTED NOT DETECTED Final   Carbapenem resistance NOT DETECTED NOT DETECTED Final  Haemophilus influenzae NOT DETECTED NOT DETECTED Final   Neisseria meningitidis NOT DETECTED NOT DETECTED Final   Pseudomonas aeruginosa NOT DETECTED NOT DETECTED Final   Candida albicans NOT DETECTED NOT DETECTED Final   Candida glabrata NOT DETECTED NOT DETECTED Final   Candida krusei NOT DETECTED NOT DETECTED Final   Candida parapsilosis NOT DETECTED NOT DETECTED Final   Candida tropicalis NOT DETECTED NOT DETECTED Final    Comment: Performed at [facility]  Culture, blood (Routine X 2) w Reflex to ID Panel     Status: Abnormal   Collection Time: 12/29/15  7:55 AM  Result Value Ref Range Status   Specimen Description BLOOD BLOOD RIGHT FOREARM  Final   Special Requests BOTTLES DRAWN AEROBIC AND ANAEROBIC 5CC EACH  Final   Culture  Setup Time   Final    GRAM VARIABLE ROD AEROBIC BOTTLE ONLY CRITICAL RESULT CALLED TO, READ BACK BY AND VERIFIED WITH: J. LEGG,PHARM AT [id number] ON [id number] BY S. YARBROUGH    Culture (A)  Final    KLEBSIELLA PNEUMONIAE SUSCEPTIBILITIES PERFORMED ON PREVIOUS CULTURE WITHIN THE LAST 5 DAYS. Performed at [facility]    Report Status 01/03/2016 FINAL  Final  Blood Culture ID Panel (Reflexed)     Status: Abnormal   Collection Time: 12/29/15  7:55 AM  Result Value Ref Range Status   Enterococcus species NOT DETECTED NOT DETECTED Final   Listeria monocytogenes NOT DETECTED NOT DETECTED Final   Staphylococcus species  NOT DETECTED NOT DETECTED Final   Staphylococcus aureus NOT DETECTED NOT DETECTED Final   Streptococcus species NOT DETECTED NOT DETECTED Final   Streptococcus agalactiae NOT DETECTED NOT DETECTED Final   Streptococcus pneumoniae NOT DETECTED NOT DETECTED Final   Streptococcus pyogenes NOT DETECTED NOT DETECTED Final   Acinetobacter baumannii NOT DETECTED NOT DETECTED Final   Enterobacteriaceae species DETECTED (A) NOT DETECTED Final    Comment: CRITICAL RESULT CALLED TO, READ BACK BY AND VERIFIED WITH: J. LEGG, PHARM AT [id number] ON [id number] BY S. YARBROUGH    Enterobacter cloacae complex NOT DETECTED NOT DETECTED Final   Escherichia coli NOT DETECTED NOT DETECTED Final   Klebsiella oxytoca NOT DETECTED NOT DETECTED Final   Klebsiella pneumoniae DETECTED (A) NOT DETECTED Final    Comment: CRITICAL RESULT CALLED TO, READ BACK BY AND VERIFIED WITH: J. LEGG, [place] [id number] ON [id number] BY S. YARBROUGH    Proteus species NOT DETECTED NOT DETECTED Final   Serratia marcescens NOT DETECTED NOT DETECTED Final   Carbapenem resistance NOT DETECTED NOT DETECTED Final   Haemophilus influenzae NOT DETECTED NOT DETECTED Final   Neisseria meningitidis NOT DETECTED NOT DETECTED Final   Pseudomonas aeruginosa NOT DETECTED NOT DETECTED Final   Candida albicans NOT DETECTED NOT DETECTED Final   Candida glabrata NOT DETECTED NOT DETECTED Final   Candida krusei NOT DETECTED NOT DETECTED Final   Candida parapsilosis NOT DETECTED NOT DETECTED Final   Candida tropicalis NOT DETECTED NOT DETECTED Final    Comment: Performed at [facility]  Urine culture     Status: None   Collection Time: 12/29/15  8:24 AM  Result Value Ref Range Status   Specimen Description URINE, RANDOM  Final   Special Requests NONE  Final   Culture NO GROWTH Performed at [facility]   Final   Report Status 12/30/2015 FINAL  Final     Radiology Studies: No results found.  Scheduled Meds: . allopurinol  300 mg Oral Daily   . amLODipine  10 mg Oral Daily  . carvedilol  25 mg Oral Daily  . cefTRIAXone (ROCEPHIN)  IV  2 g Intravenous Q24H  . clomiPHENE  50 mg Oral Daily  . emtricitabine-rilpivir-tenofovir AF  1 tablet Oral Q breakfast  . enoxaparin (LOVENOX) injection  40 mg Subcutaneous Daily  . fluticasone  1 spray Each Nare Daily  . Icosapent Ethyl  1 g Oral BID  . insulin aspart  0-15 Units Subcutaneous TID WC  . insulin aspart  0-5 Units Subcutaneous QHS  . insulin aspart  5 Units Subcutaneous TID WC  . insulin glargine  20 Units Subcutaneous QHS  . irbesartan  300 mg Oral Daily  . linagliptin  5 mg Oral Daily   Continuous Infusions:    LOS: 5 days   CHIU, Orpah Melter, MD [facility] Pager [phone]  If 7PM-7AM, please contact night-coverage [email or web address] Password [email or web address] 01/03/2016, 4:50 PM

## 2016-01-04 LAB — COMPREHENSIVE METABOLIC PANEL
ALT: 15 U/L — ABNORMAL LOW (ref 17–63)
AST: 27 U/L (ref 15–41)
Albumin: 2.7 g/dL — ABNORMAL LOW (ref 3.5–5.0)
Alkaline Phosphatase: 78 U/L (ref 38–126)
Anion gap: 9 (ref 5–15)
BUN: 15 mg/dL (ref 6–20)
CO2: 24 mmol/L (ref 22–32)
Calcium: 8.2 mg/dL — ABNORMAL LOW (ref 8.9–10.3)
Chloride: 104 mmol/L (ref 101–111)
Creatinine, Ser: 1.06 mg/dL (ref 0.61–1.24)
GFR calc Af Amer: >60 mL/min (ref >60)
GFR calc non Af Amer: >60 mL/min (ref >60)
Glucose, Bld: 170 mg/dL — ABNORMAL HIGH (ref 65–99)
Potassium: 3.5 mmol/L (ref 3.5–5.1)
Sodium: 137 mmol/L (ref 135–145)
Total Bilirubin: 1 mg/dL (ref 0.3–1.2)
Total Protein: 6.6 g/dL (ref 6.5–8.1)

## 2016-01-04 LAB — CBC
HCT: 32.5 % — ABNORMAL LOW (ref 39.0–52.0)
Hemoglobin: 10.9 g/dL — ABNORMAL LOW (ref 13.0–17.0)
MCH: 33.3 pg (ref 26.0–34.0)
MCHC: 33.5 g/dL (ref 30.0–36.0)
MCV: 99.4 fL (ref 78.0–100.0)
Platelets: 272 10*3/uL (ref 150–400)
RBC: 3.27 MIL/uL — ABNORMAL LOW (ref 4.22–5.81)
RDW: 14.4 % (ref 11.5–15.5)
WBC: 12.7 10*3/uL — ABNORMAL HIGH (ref 4.0–10.5)

## 2016-01-04 LAB — GLUCOSE, CAPILLARY
Glucose-Capillary: 178 mg/dL — ABNORMAL HIGH (ref 65–99)
Glucose-Capillary: 230 mg/dL — ABNORMAL HIGH (ref 65–99)

## 2016-01-04 MED ORDER — FUROSEMIDE 40 MG PO TABS: 40.0000 mg | ORAL_TABLET | Freq: Every day | ORAL | 0 refills | Status: DC

## 2016-01-04 MED ORDER — FLUTICASONE PROPIONATE 50 MCG/ACT NA SUSP: 1.0000 | Freq: Every day | NASAL | 0 refills | Status: DC

## 2016-01-04 MED ORDER — CIPROFLOXACIN HCL 500 MG PO TABS: 500.0000 mg | ORAL_TABLET | Freq: Two times a day (BID) | ORAL | 0 refills | Status: DC

## 2016-01-04 MED ORDER — CIPROFLOXACIN HCL 500 MG PO TABS
500.0000 mg | ORAL_TABLET | Freq: Two times a day (BID) | ORAL | Status: DC
  Administered 2016-01-04: 500 mg via ORAL
  Filled 2016-01-04: qty 1

## 2016-01-04 NOTE — Progress Notes (Signed)
Patient d/c home. Stable. No c/o SOB.

## 2016-01-04 NOTE — Progress Notes (Signed)
Patient ambulated in the hallway,oxygen level observed, at rest- 97%,with ambulation it was 92%, denies c/o SOB. Tolerated the activity.

## 2016-01-04 NOTE — Progress Notes (Signed)
Discharge instructions rendered. Discussed meds, follow up appointment and prescriptions given,verbalized understanding. .All questions answered appropriately. Patient is stable.

## 2016-01-04 NOTE — Discharge Summary (Signed)
Physician Discharge Summary  [name] [id number] DOB: November 21, 1950 DOA: 12/29/2015  PCP: [name], MD  Admit date: 12/29/2015 Discharge date: 01/04/2016  Admitted From: Home Disposition:  Home  Recommendations for Outpatient Follow-up:  1. Follow up with PCP in 1 week 2. Recommend repeat basic metabolic panel and CBC in 1-2 weeks  Discharge Condition:Improved CODE STATUS:Full Diet recommendation: Diabetic   Brief/Interim Summary: 65 yo male with a history of well-controlled HIV, and hemochromatosis with NIDDM and hypogonadism who presented to the ED 10/21 due to fever and symptoms of recurrent UTI.   He had been treated for UTI with cipro with resolution of urinary urgency/frequency but had return of symptoms 3 days PTA and noted worsening fever and shaking chills since that time. He also endorses left flank pain. On arrival HR was 120s, RR 30/min, abdomen was benign, and he felt subjectively improved after IV fluids. Urinalysis showed significant glucosuria, granular casts, rare bacteria, 6-30 WBCs, nitrite negative. CXR showed vascular congestion and mild cardiomegaly with atelectasis. Bicarb 18, creatinine 1.38, BNP 211. He was admitted for treatment of sepsis due to presumed urinary source. 1 of 2 blood cultures grew Klebsiella pneumoniae and rocephin was continue  Sepsis due to pyelonephritis:  -Patient with recurrent UTI Klebsiella bacteremia -Patient had been continued on Rocephin. -Discussed with pharmacy. Recommendation for transitioned to ciprofloxacin at time of discharge to complete course of antibiotics -Patient remains afebrile -Leukocytosis remains unchanged.  Acute respiratory failure, possible acutely decompensated diastolic congestive heart failure, chronicity is unknown  -Patient recently noted to have increased O2 requirements  -Patient given 1 dose of IV Lasix on 01/02/2016 with resultant 5 L urine output in 24 hours. -Patient has reported marked  improvement in symptoms and is ambulating comfortably on room air presently -Following another dose of IV lasix, patient had resultant 3L urine output in 24hrs - Will prescribe short course of PO lasix at time of discharge  Human immunodeficiency virus (HIV) disease:  -Patient reportedly compliant with his medications -Continued antiviral medications as tolerated -Viral load reportedly nondetectable for the past several years  -Most recent CD4 count was noted to be 810   Diabetes mellitus type 2 with complications:  -Blood glucose remains stable -Recent hemoglobin A1c from 09/13/2014 noted to be 6.1 -Patient was continued on Lantus 12 units, 3 units of pre-meal insulin 3 times a day -Will have patient resume home diabetic regimen at time of discharge  Stage III CKD:  -Renal function remains stable -Patient reports being followed by Dr.Coladonato as an outpatient  -Recommend repeat bmet in 1 week  Mild acidosis:  -Resolved  Gilbert's syndrome:  -Most recent total bilirubin noted be 2.9 -Continue to monitor  Essential hypertension -Blood pressure currently stable and well-controlled -Continued amlodipine, Coreg, ARB as tolerated  Hemochromatosis:  -Appeared stable at present  Hypertriglyceridemia -Continued Icosapent as tolerated  Hypogonadism:  -Suspect secondary to hemochromatosis -Patient to continue clomiphene  Gout -Remains stable with no signs of acute gout flare -Continued allopurinol colchicine as tolerated l  Erectile dysfunction: - Sildenafil remains on hold for the time being  Discharge Diagnoses:  Principal Problem:   Sepsis ([place]) Active Problems:   Human immunodeficiency virus (HIV) disease ([place])   Diabetes mellitus type 2 with complications ([place])   Disorder of bilirubin excretion   Essential hypertension   HEMOCHROMATOSIS, HX OF   Hyperlipidemia   Hypogonadism male   Hypoxia   Pulmonary edema   UTI (urinary tract  infection)    Discharge Instructions  Medication List    STOP taking these medications   INVOKANA 300 MG Tabs tablet Generic drug:  canagliflozin   sildenafil 20 MG tablet Commonly known as:  REVATIO     TAKE these medications   allopurinol 300 MG tablet Commonly known as:  ZYLOPRIM Take 300 mg by mouth daily.   amLODipine 10 MG tablet Commonly known as:  NORVASC Take 10 mg by mouth daily.   carvedilol 25 MG tablet Commonly known as:  COREG Take 25 mg by mouth daily.   ciprofloxacin 500 MG tablet Commonly known as:  CIPRO Take 1 tablet (500 mg total) by mouth 2 (two) times daily.   clomiPHENE 50 MG tablet Commonly known as:  CLOMID Take 1 tablet (50 mg total) by mouth daily.   colchicine 0.6 MG tablet Take 0.6 mg by mouth daily as needed (gout).   emtricitabine-rilpivir-tenofovir AF 200-25-25 MG Tabs tablet Commonly known as:  ODEFSEY Take 1 tablet by mouth daily with breakfast.   fluticasone 50 MCG/ACT nasal spray Commonly known as:  FLONASE Place 1 spray into both nostrils daily. Start taking on:  01/05/2016   furosemide 40 MG tablet Commonly known as:  LASIX Take 1 tablet (40 mg total) by mouth daily. Start taking on:  01/05/2016   glucose blood test strip Commonly known as:  ONETOUCH VERIO 1 each by Other route daily. And lancets 1/day   JANUVIA 100 MG tablet Generic drug:  sitaGLIPtin TAKE 1 TABLET(100 MG) BY MOUTH DAILY What changed:  Another medication with the same name was removed. Continue taking this medication, and follow the directions you see here.   metFORMIN 500 MG 24 hr tablet Commonly known as:  GLUCOPHAGE-XR Take 4 tablets by mouth  daily What changed:  how much to take  how to take this  when to take this  additional instructions   telmisartan 80 MG tablet Commonly known as:  MICARDIS Take 80 mg by mouth daily before breakfast.   VASCEPA PO Take 1 tablet by mouth 2 (two) times daily.      Follow-up  Information    [name], MD. Schedule an appointment as soon as possible for a visit in 1 week(s).   Specialty:  Internal Medicine Contact information: [place] [address] [phone]        Bobby Rumpf, MD .   Specialty:  Infectious Diseases Contact information: [address] [place] [address] [phone]          Allergies  Allergen Reactions  . Codeine Itching  . Meperidine Hcl Itching  . Scopolamine     GIVEN WITH A SURGERY YRS AGO--CAUSED HALLUNCINATIONS    Procedures/Studies: Dg Chest 2 View  Result Date: 01/01/2016 CLINICAL DATA:  65 year old male with increasing chest pain and shortness of breath EXAM: CHEST  2 VIEW COMPARISON:  Chest radiograph dated 12/31/2015 and chest CT dated 08/04/2008 FINDINGS: There is mild cardiomegaly with mild increased vascular prominence compatible with congestive changes. Left lung base linear densities may represent atelectatic changes/ scarring versus less likely developing pneumonia. Clinical correlation is recommended. No focal consolidation, pleural effusion, or pneumothorax. Right glenoid surgical pain no acute osseous pathology. Right upper quadrant surgical clips. IMPRESSION: Cardiomegaly with mild vascular congestion. Left lung base linear atelectasis/ scarring versus less likely developing infiltrate. Clinical correlation and follow-up recommended. Electronically Signed   By: Anner Crete M.D.   On: 01/01/2016 00:59   Dg Chest 2 View  Result Date: 12/31/2015 CLINICAL DATA:  Sob, cough and fever x 1 week.  nonsmoker EXAM: CHEST  2 VIEW COMPARISON:  12/29/2015 FINDINGS: Moderate thoracic spondylosis. Surgical changes about the right glenohumeral joint. Midline trachea. Mild cardiomegaly. Mediastinal contours otherwise within normal limits. No pleural effusion or pneumothorax. Patchy left base pulmonary opacity. Surgical clips in the right upper quadrant. IMPRESSION: Patchy  left base pulmonary opacities which are favored to represent areas of atelectasis. Early or mild pneumonia cannot be excluded. If symptoms persist, radiographic follow-up in 2-4 days may be informative. Electronically Signed   By: Abigail Miyamoto M.D.   On: 12/31/2015 08:25   Dg Chest 2 View  Result Date: 12/29/2015 CLINICAL DATA:  65 year male with shortness of breath. Diabetes and hypertension. Initial encounter. EXAM: CHEST  2 VIEW COMPARISON:  04/18/2011. FINDINGS: Chronically elevated right hemidiaphragm. Pulmonary vascular prominence most notable centrally. Subsegmental atelectatic changes suspected peripheral aspect right mid to lower lung zone. This can be assessed on follow-up. No plain film evidence pulmonary malignancy. Mild cardiomegaly. Slightly tortuous aorta. Right shoulder joint degenerative changes. IMPRESSION: Pulmonary vascular congestion most notable centrally. Subsegmental atelectatic changes suspected peripheral aspect right mid to lower lung zone. This can be assessed on follow-up. Cardiomegaly. Electronically Signed   By: Genia Del M.D.   On: 12/29/2015 07:54   Ct Angio Chest Pe W Or Wo Contrast  Result Date: 01/01/2016 CLINICAL DATA:  Left upper chest pain with deep breath. Recent sepsis. EXAM: CT ANGIOGRAPHY CHEST WITH CONTRAST TECHNIQUE: Multidetector CT imaging of the chest was performed using the standard protocol during bolus administration of intravenous contrast. Multiplanar CT image reconstructions and MIPs were obtained to evaluate the vascular anatomy. CONTRAST:  100 cc Isovue 370 IV COMPARISON:  Chest x-ray 12/31/2015.  Chest CT 08/04/2008. FINDINGS: Cardiovascular: Heart is mildly enlarged. Aorta is normal caliber. No filling defects in the pulmonary arteries to suggest pulmonary emboli. Mediastinum/Nodes: No mediastinal, hilar, or axillary adenopathy. Lungs/Pleura: Trace bilateral pleural effusions. Dependent atelectasis posteriorly in the lower lobes.  Lingular atelectasis or scarring. Upper Abdomen: Mildly nodular contours to the liver surface suggesting cirrhosis. Imaging into the upper abdomen shows no acute findings. Musculoskeletal: Chest wall soft tissues are unremarkable. No acute bony abnormality or focal bone lesion. Diffuse degenerative spurring throughout the thoracic spine. Review of the MIP images confirms the above findings. IMPRESSION: Mild cardiomegaly. Trace bilateral pleural effusions. Dependent atelectasis in the lower lobes. No evidence of pulmonary embolus. Changes of cirrhosis. Electronically Signed   By: Rolm Baptise M.D.   On: 01/01/2016 15:12   Ct Abdomen Pelvis W Contrast  Result Date: 12/30/2015 CLINICAL DATA:  65 year old male with history of fever and recurrent urinary tract infection. History of HIV. History of hemochromatosis. EXAM: CT ABDOMEN AND PELVIS WITH CONTRAST TECHNIQUE: Multidetector CT imaging of the abdomen and pelvis was performed using the standard protocol following bolus administration of intravenous contrast. CONTRAST:  67mL ISOVUE-300 IOPAMIDOL (ISOVUE-300) INJECTION 61%, 72mL ISOVUE-300 IOPAMIDOL (ISOVUE-300) INJECTION 61% COMPARISON:  CT the abdomen and pelvis 10/22/2007. FINDINGS: Lower chest: Mild scarring throughout the visualize lung bases. Hepatobiliary: The liver has a shrunken appearance and nodular contour, suggestive of cirrhosis. No discrete cystic or solid hepatic lesions. No intra or extrahepatic biliary ductal dilatation. Status post cholecystectomy. Pancreas: No pancreatic mass. No pancreatic ductal dilatation. No pancreatic or peripancreatic fluid or inflammatory changes. Spleen: Spleen is enlarged measuring 15.6 x 5.6 x 16.4 cm (estimated splenic volume of 716 mL). Small calcified granuloma in the superior aspect of the spleen. Adrenals/Urinary Tract: Heterogeneous areas of  hypoenhancement in the left kidney with surrounding perinephric stranding, concerning for left-sided pyelonephritis. Right  kidney is normal in appearance. Mild left hydroureter. No right-sided hydroureter. Urinary bladder is normal in appearance. Surgical clips above the right kidney, presumably from prior right adrenalectomy. Left adrenal gland is normal in appearance. Stomach/Bowel: The appearance of the stomach is normal. There is no pathologic dilatation of small bowel or colon. The appendix is not confidently identified and may be surgically absent. Regardless, there are no inflammatory changes noted adjacent to the cecum to suggest the presence of an acute appendicitis at this time. Vascular/Lymphatic: Aortic atherosclerosis, without evidence of aneurysm in the abdominal or pelvic vasculature. No lymphadenopathy noted in the abdomen or pelvis. Reproductive: Prostate gland and seminal vesicles are unremarkable in appearance. Other: No significant volume of ascites.  No pneumoperitoneum. Musculoskeletal: There are no aggressive appearing lytic or blastic lesions noted in the visualized portions of the skeleton. IMPRESSION: 1. Findings are concerning for uncomplicated left-sided pyelonephritis. Correlation with urinalysis is recommended. 2. Morphologic changes in the liver compatible with underlying cirrhosis. 3. Splenomegaly. Electronically Signed   By: Vinnie Langton M.D.   On: 12/30/2015 17:47    Subjective: Eager to go home  Discharge Exam: Vitals:   01/04/16 0547 01/04/16 1006  BP: 138/69 135/67  Pulse: 94   Resp: 16   Temp: 99 F (37.2 C)    Vitals:   01/03/16 1530 01/03/16 2139 01/04/16 0547 01/04/16 1006  BP: 120/61 (!) 148/77 138/69 135/67  Pulse: 83 78 94   Resp: 18 16 16    Temp: 98.1 F (36.7 C) 99.3 F (37.4 C) 99 F (37.2 C)   TempSrc: Oral Oral Oral   SpO2: 97% 97% 96%   Weight:      Height:        General: Pt is alert, awake, not in acute distress Cardiovascular: RRR, S1/S2 +, no rubs, no gallops Respiratory: CTA bilaterally, no wheezing, no rhonchi Abdominal: Soft, NT, ND, bowel  sounds + Extremities: no edema, no cyanosis   The results of significant diagnostics from this hospitalization (including imaging, microbiology, ancillary and laboratory) are listed below for reference.     Microbiology: Recent Results (from the past 240 hour(s))  Culture, blood (Routine X 2) w Reflex to ID Panel     Status: Abnormal   Collection Time: 12/29/15  7:48 AM  Result Value Ref Range Status   Specimen Description BLOOD BLOOD LEFT FOREARM  Final   Special Requests BOTTLES DRAWN AEROBIC AND ANAEROBIC 5 CCE ACH  Final   Culture  Setup Time   Final    AEROBIC BOTTLE ONLY GRAM NEGATIVE RODS CRITICAL RESULT CALLED TO, READ BACK BY AND VERIFIED WITH: TO LPOINTDEXTER(PHARD) BY TCLEVELAND 12/30/2015 AT 5:51AM Performed at [place] (A)  Final   Report Status 01/01/2016 FINAL  Final   Organism ID, Bacteria KLEBSIELLA PNEUMONIAE  Final      Susceptibility   Klebsiella pneumoniae - MIC*    AMPICILLIN >=32 RESISTANT Resistant     CEFAZOLIN <=4 SENSITIVE Sensitive     CEFEPIME <=1 SENSITIVE Sensitive     CEFTAZIDIME <=1 SENSITIVE Sensitive     CEFTRIAXONE <=1 SENSITIVE Sensitive     CIPROFLOXACIN <=0.25 SENSITIVE Sensitive     GENTAMICIN <=1 SENSITIVE Sensitive     IMIPENEM <=0.25 SENSITIVE Sensitive     TRIMETH/SULFA <=20 SENSITIVE Sensitive     AMPICILLIN/SULBACTAM 4 SENSITIVE Sensitive     PIP/TAZO <=4 SENSITIVE Sensitive  Extended ESBL NEGATIVE Sensitive     * KLEBSIELLA PNEUMONIAE  Blood Culture ID Panel (Reflexed)     Status: Abnormal   Collection Time: 12/29/15  7:48 AM  Result Value Ref Range Status   Enterococcus species NOT DETECTED NOT DETECTED Final   Vancomycin resistance NOT DETECTED NOT DETECTED Final   Listeria monocytogenes NOT DETECTED NOT DETECTED Final   Staphylococcus species NOT DETECTED NOT DETECTED Final   Staphylococcus aureus NOT DETECTED NOT DETECTED Final   Methicillin resistance NOT DETECTED NOT  DETECTED Final   Streptococcus species NOT DETECTED NOT DETECTED Final   Streptococcus agalactiae NOT DETECTED NOT DETECTED Final   Streptococcus pneumoniae NOT DETECTED NOT DETECTED Final   Streptococcus pyogenes NOT DETECTED NOT DETECTED Final   Acinetobacter baumannii NOT DETECTED NOT DETECTED Final   Enterobacteriaceae species DETECTED (A) NOT DETECTED Final    Comment: CRITICAL RESULT CALLED TO, READ BACK BY AND VERIFIED WITH: TO LPOINTDEXTER(PHARD) BY TCLEVELAND 12/30/2015 AT 5:51AM    Enterobacter cloacae complex NOT DETECTED NOT DETECTED Final   Escherichia coli NOT DETECTED NOT DETECTED Final   Klebsiella oxytoca NOT DETECTED NOT DETECTED Final   Klebsiella pneumoniae DETECTED (A) NOT DETECTED Final    Comment: CRITICAL RESULT CALLED TO, READ BACK BY AND VERIFIED WITH: TO LPOINTDEXTER(PHARD) BY TCLEVELAND 12/30/2015 AT 5:51AM    Proteus species NOT DETECTED NOT DETECTED Final   Serratia marcescens NOT DETECTED NOT DETECTED Final   Carbapenem resistance NOT DETECTED NOT DETECTED Final   Haemophilus influenzae NOT DETECTED NOT DETECTED Final   Neisseria meningitidis NOT DETECTED NOT DETECTED Final   Pseudomonas aeruginosa NOT DETECTED NOT DETECTED Final   Candida albicans NOT DETECTED NOT DETECTED Final   Candida glabrata NOT DETECTED NOT DETECTED Final   Candida krusei NOT DETECTED NOT DETECTED Final   Candida parapsilosis NOT DETECTED NOT DETECTED Final   Candida tropicalis NOT DETECTED NOT DETECTED Final    Comment: Performed at [facility]  Culture, blood (Routine X 2) w Reflex to ID Panel     Status: Abnormal   Collection Time: 12/29/15  7:55 AM  Result Value Ref Range Status   Specimen Description BLOOD BLOOD RIGHT FOREARM  Final   Special Requests BOTTLES DRAWN AEROBIC AND ANAEROBIC 5CC EACH  Final   Culture  Setup Time   Final    GRAM VARIABLE ROD AEROBIC BOTTLE ONLY CRITICAL RESULT CALLED TO, READ BACK BY AND VERIFIED WITH: J. LEGG,PHARM AT [id number] ON [id number]  BY S. YARBROUGH    Culture (A)  Final    KLEBSIELLA PNEUMONIAE SUSCEPTIBILITIES PERFORMED ON PREVIOUS CULTURE WITHIN THE LAST 5 DAYS. Performed at [facility]    Report Status 01/03/2016 FINAL  Final  Blood Culture ID Panel (Reflexed)     Status: Abnormal   Collection Time: 12/29/15  7:55 AM  Result Value Ref Range Status   Enterococcus species NOT DETECTED NOT DETECTED Final   Listeria monocytogenes NOT DETECTED NOT DETECTED Final   Staphylococcus species NOT DETECTED NOT DETECTED Final   Staphylococcus aureus NOT DETECTED NOT DETECTED Final   Streptococcus species NOT DETECTED NOT DETECTED Final   Streptococcus agalactiae NOT DETECTED NOT DETECTED Final   Streptococcus pneumoniae NOT DETECTED NOT DETECTED Final   Streptococcus pyogenes NOT DETECTED NOT DETECTED Final   Acinetobacter baumannii NOT DETECTED NOT DETECTED Final   Enterobacteriaceae species DETECTED (A) NOT DETECTED Final    Comment: CRITICAL RESULT CALLED TO, READ BACK BY AND VERIFIED WITH: J. [place], [place] AT [place] ON [id number]  BY S. YARBROUGH    Enterobacter cloacae complex NOT DETECTED NOT DETECTED Final   Escherichia coli NOT DETECTED NOT DETECTED Final   Klebsiella oxytoca NOT DETECTED NOT DETECTED Final   Klebsiella pneumoniae DETECTED (A) NOT DETECTED Final    Comment: CRITICAL RESULT CALLED TO, READ BACK BY AND VERIFIED WITH: J. LEGG, [place] ON [id number] BY S. YARBROUGH    Proteus species NOT DETECTED NOT DETECTED Final   Serratia marcescens NOT DETECTED NOT DETECTED Final   Carbapenem resistance NOT DETECTED NOT DETECTED Final   Haemophilus influenzae NOT DETECTED NOT DETECTED Final   Neisseria meningitidis NOT DETECTED NOT DETECTED Final   Pseudomonas aeruginosa NOT DETECTED NOT DETECTED Final   Candida albicans NOT DETECTED NOT DETECTED Final   Candida glabrata NOT DETECTED NOT DETECTED Final   Candida krusei NOT DETECTED NOT DETECTED Final   Candida parapsilosis NOT DETECTED NOT DETECTED Final    Candida tropicalis NOT DETECTED NOT DETECTED Final    Comment: Performed at [facility]  Urine culture     Status: None   Collection Time: 12/29/15  8:24 AM  Result Value Ref Range Status   Specimen Description URINE, RANDOM  Final   Special Requests NONE  Final   Culture NO GROWTH Performed at [facility]   Final   Report Status 12/30/2015 FINAL  Final     Labs: BNP (last 3 results)  Recent Labs  12/29/15 0755  BNP [id number]*   Basic Metabolic Panel:  Recent Labs Lab 12/30/15 0431 12/31/15 0344 01/01/16 0356 01/02/16 0411 01/04/16 0432  NA 134* 135 137 138 137  K 3.5 3.8 3.8 4.2 3.5  CL 105 107 110 110 104  CO2 17* 19* 20* 22 24  GLUCOSE 237* 239* 225* 188* 170*  BUN 26* 20 21* 18 15  CREATININE 1.43* 1.18 1.16 1.19 1.06  CALCIUM 8.5* 8.3* 8.1* 8.3* 8.2*   Liver Function Tests:  Recent Labs Lab 12/29/15 0955 01/04/16 0432  AST 35 27  ALT 14* 15*  ALKPHOS 123 78  BILITOT 2.9* 1.0  PROT 6.5 6.6  ALBUMIN 2.8* 2.7*   No results for input(s): LIPASE, AMYLASE in the last 168 hours. No results for input(s): AMMONIA in the last 168 hours. CBC:  Recent Labs Lab 12/29/15 0755  12/31/15 0344 01/01/16 0356 01/02/16 0411 01/03/16 0409 01/04/16 0432  WBC 6.8  < > 12.1* 11.7* 12.0* 11.9* 12.7*  NEUTROABS 5.7  --   --   --   --   --   --   HGB 11.9*  < > 10.8* 10.7* 10.2* 11.2* 10.9*  HCT 34.4*  < > 32.3* 32.4* 31.2* 33.5* 32.5*  MCV 98.3  < > 100.6* 101.3* 101.6* 99.7 99.4  PLT 138*  < > 169 190 205 246 272  < > = values in this interval not displayed. Cardiac Enzymes:  Recent Labs Lab 01/01/16 1235  TROPONINI 0.03*   BNP: Invalid input(s): POCBNP CBG:  Recent Labs Lab 01/03/16 0808 01/03/16 1227 01/03/16 1714 01/03/16 2124 01/04/16 0747  GLUCAP 196* 234* 146* 207* 178*   D-Dimer No results for input(s): DDIMER in the last 72 hours. Hgb A1c No results for input(s): HGBA1C in the last 72 hours. Lipid Profile No results  for input(s): CHOL, HDL, LDLCALC, TRIG, CHOLHDL, LDLDIRECT in the last 72 hours. Thyroid function studies No results for input(s): TSH, T4TOTAL, T3FREE, THYROIDAB in the last 72 hours.  Invalid input(s): FREET3 Anemia work up No results for input(s):  VITAMINB12, FOLATE, FERRITIN, TIBC, IRON, RETICCTPCT in the last 72 hours. Urinalysis    Component Value Date/Time   COLORURINE AMBER (A) 12/29/2015 0824   APPEARANCEUR CLOUDY (A) 12/29/2015 0824   LABSPEC 1.026 12/29/2015 0824   PHURINE 6.0 12/29/2015 0824   GLUCOSEU >1000 (A) 12/29/2015 0824   GLUCOSEU NEG mg/dL 08/24/2006 1916   HGBUR MODERATE (A) 12/29/2015 0824   HGBUR negative 01/04/2007 0918   BILIRUBINUR NEGATIVE 12/29/2015 0824   KETONESUR NEGATIVE 12/29/2015 0824   PROTEINUR 100 (A) 12/29/2015 0824   UROBILINOGEN 0.2 04/18/2011 1404   NITRITE NEGATIVE 12/29/2015 0824   LEUKOCYTESUR TRACE (A) 12/29/2015 0824   Sepsis Labs Invalid input(s): PROCALCITONIN,  WBC,  LACTICIDVEN Microbiology Recent Results (from the past 240 hour(s))  Culture, blood (Routine X 2) w Reflex to ID Panel     Status: Abnormal   Collection Time: 12/29/15  7:48 AM  Result Value Ref Range Status   Specimen Description BLOOD BLOOD LEFT FOREARM  Final   Special Requests BOTTLES DRAWN AEROBIC AND ANAEROBIC 5 CCE ACH  Final   Culture  Setup Time   Final    AEROBIC BOTTLE ONLY GRAM NEGATIVE RODS CRITICAL RESULT CALLED TO, READ BACK BY AND VERIFIED WITH: TO LPOINTDEXTER(PHARD) BY TCLEVELAND 12/30/2015 AT 5:51AM Performed at [place] (A)  Final   Report Status 01/01/2016 FINAL  Final   Organism ID, Bacteria KLEBSIELLA PNEUMONIAE  Final      Susceptibility   Klebsiella pneumoniae - MIC*    AMPICILLIN >=32 RESISTANT Resistant     CEFAZOLIN <=4 SENSITIVE Sensitive     CEFEPIME <=1 SENSITIVE Sensitive     CEFTAZIDIME <=1 SENSITIVE Sensitive     CEFTRIAXONE <=1 SENSITIVE Sensitive     CIPROFLOXACIN <=0.25  SENSITIVE Sensitive     GENTAMICIN <=1 SENSITIVE Sensitive     IMIPENEM <=0.25 SENSITIVE Sensitive     TRIMETH/SULFA <=20 SENSITIVE Sensitive     AMPICILLIN/SULBACTAM 4 SENSITIVE Sensitive     PIP/TAZO <=4 SENSITIVE Sensitive     Extended ESBL NEGATIVE Sensitive     * KLEBSIELLA PNEUMONIAE  Blood Culture ID Panel (Reflexed)     Status: Abnormal   Collection Time: 12/29/15  7:48 AM  Result Value Ref Range Status   Enterococcus species NOT DETECTED NOT DETECTED Final   Vancomycin resistance NOT DETECTED NOT DETECTED Final   Listeria monocytogenes NOT DETECTED NOT DETECTED Final   Staphylococcus species NOT DETECTED NOT DETECTED Final   Staphylococcus aureus NOT DETECTED NOT DETECTED Final   Methicillin resistance NOT DETECTED NOT DETECTED Final   Streptococcus species NOT DETECTED NOT DETECTED Final   Streptococcus agalactiae NOT DETECTED NOT DETECTED Final   Streptococcus pneumoniae NOT DETECTED NOT DETECTED Final   Streptococcus pyogenes NOT DETECTED NOT DETECTED Final   Acinetobacter baumannii NOT DETECTED NOT DETECTED Final   Enterobacteriaceae species DETECTED (A) NOT DETECTED Final    Comment: CRITICAL RESULT CALLED TO, READ BACK BY AND VERIFIED WITH: TO LPOINTDEXTER(PHARD) BY TCLEVELAND 12/30/2015 AT 5:51AM    Enterobacter cloacae complex NOT DETECTED NOT DETECTED Final   Escherichia coli NOT DETECTED NOT DETECTED Final   Klebsiella oxytoca NOT DETECTED NOT DETECTED Final   Klebsiella pneumoniae DETECTED (A) NOT DETECTED Final    Comment: CRITICAL RESULT CALLED TO, READ BACK BY AND VERIFIED WITH: TO LPOINTDEXTER(PHARD) BY TCLEVELAND 12/30/2015 AT 5:51AM    Proteus species NOT DETECTED NOT DETECTED Final   Serratia marcescens NOT DETECTED NOT DETECTED Final   Carbapenem  resistance NOT DETECTED NOT DETECTED Final   Haemophilus influenzae NOT DETECTED NOT DETECTED Final   Neisseria meningitidis NOT DETECTED NOT DETECTED Final   Pseudomonas aeruginosa NOT DETECTED NOT DETECTED  Final   Candida albicans NOT DETECTED NOT DETECTED Final   Candida glabrata NOT DETECTED NOT DETECTED Final   Candida krusei NOT DETECTED NOT DETECTED Final   Candida parapsilosis NOT DETECTED NOT DETECTED Final   Candida tropicalis NOT DETECTED NOT DETECTED Final    Comment: Performed at [facility]  Culture, blood (Routine X 2) w Reflex to ID Panel     Status: Abnormal   Collection Time: 12/29/15  7:55 AM  Result Value Ref Range Status   Specimen Description BLOOD BLOOD RIGHT FOREARM  Final   Special Requests BOTTLES DRAWN AEROBIC AND ANAEROBIC 5CC EACH  Final   Culture  Setup Time   Final    GRAM VARIABLE ROD AEROBIC BOTTLE ONLY CRITICAL RESULT CALLED TO, READ BACK BY AND VERIFIED WITH: J. LEGG,PHARM AT [id number] ON [id number] BY S. YARBROUGH    Culture (A)  Final    KLEBSIELLA PNEUMONIAE SUSCEPTIBILITIES PERFORMED ON PREVIOUS CULTURE WITHIN THE LAST 5 DAYS. Performed at [facility]    Report Status 01/03/2016 FINAL  Final  Blood Culture ID Panel (Reflexed)     Status: Abnormal   Collection Time: 12/29/15  7:55 AM  Result Value Ref Range Status   Enterococcus species NOT DETECTED NOT DETECTED Final   Listeria monocytogenes NOT DETECTED NOT DETECTED Final   Staphylococcus species NOT DETECTED NOT DETECTED Final   Staphylococcus aureus NOT DETECTED NOT DETECTED Final   Streptococcus species NOT DETECTED NOT DETECTED Final   Streptococcus agalactiae NOT DETECTED NOT DETECTED Final   Streptococcus pneumoniae NOT DETECTED NOT DETECTED Final   Streptococcus pyogenes NOT DETECTED NOT DETECTED Final   Acinetobacter baumannii NOT DETECTED NOT DETECTED Final   Enterobacteriaceae species DETECTED (A) NOT DETECTED Final    Comment: CRITICAL RESULT CALLED TO, READ BACK BY AND VERIFIED WITH: J. LEGG, PHARM AT [id number] ON [id number] BY S. YARBROUGH    Enterobacter cloacae complex NOT DETECTED NOT DETECTED Final   Escherichia coli NOT DETECTED NOT DETECTED Final   Klebsiella oxytoca NOT  DETECTED NOT DETECTED Final   Klebsiella pneumoniae DETECTED (A) NOT DETECTED Final    Comment: CRITICAL RESULT CALLED TO, READ BACK BY AND VERIFIED WITH: J. LEGG, [place] [id number] ON [id number] BY S. YARBROUGH    Proteus species NOT DETECTED NOT DETECTED Final   Serratia marcescens NOT DETECTED NOT DETECTED Final   Carbapenem resistance NOT DETECTED NOT DETECTED Final   Haemophilus influenzae NOT DETECTED NOT DETECTED Final   Neisseria meningitidis NOT DETECTED NOT DETECTED Final   Pseudomonas aeruginosa NOT DETECTED NOT DETECTED Final   Candida albicans NOT DETECTED NOT DETECTED Final   Candida glabrata NOT DETECTED NOT DETECTED Final   Candida krusei NOT DETECTED NOT DETECTED Final   Candida parapsilosis NOT DETECTED NOT DETECTED Final   Candida tropicalis NOT DETECTED NOT DETECTED Final    Comment: Performed at [facility]  Urine culture     Status: None   Collection Time: 12/29/15  8:24 AM  Result Value Ref Range Status   Specimen Description URINE, RANDOM  Final   Special Requests NONE  Final   Culture NO GROWTH Performed at [facility]   Final   Report Status 12/30/2015 FINAL  Final     SIGNED:   Donne Hazel, MD  [facility]  01/04/2016, 11:42 AM  If 7PM-7AM, please contact night-coverage [email or web address] Password [email or web address]

## 2016-04-21 ENCOUNTER — Other Ambulatory Visit: Payer: Managed Care, Other (non HMO)

## 2016-04-22 ENCOUNTER — Other Ambulatory Visit: Payer: Managed Care, Other (non HMO)

## 2016-04-22 ENCOUNTER — Other Ambulatory Visit (HOSPITAL_COMMUNITY)
Admission: RE | Admit: 2016-04-22 | Discharge: 2016-04-22 | Disposition: A | Discharge status: Home / Self Care | Payer: Managed Care, Other (non HMO) | Source: Ambulatory Visit | Attending: Infectious Diseases | Admitting: Infectious Diseases

## 2016-04-22 DIAGNOSIS — Z113 Encounter for screening for infections with a predominantly sexual mode of transmission: Secondary | ICD-10-CM | POA: Diagnosis present

## 2016-04-22 DIAGNOSIS — Z79899 Other long term (current) drug therapy: Secondary | ICD-10-CM

## 2016-04-22 DIAGNOSIS — B20 Human immunodeficiency virus [HIV] disease: Secondary | ICD-10-CM

## 2016-04-22 LAB — CBC
HCT: 41.4 % (ref 38.5–50.0)
Hemoglobin: 14.3 g/dL (ref 13.2–17.1)
MCH: 34 pg — ABNORMAL HIGH (ref 27.0–33.0)
MCHC: 34.5 g/dL (ref 32.0–36.0)
MCV: 98.3 fL (ref 80.0–100.0)
MPV: 10.3 fL (ref 7.5–12.5)
Platelets: 188 10*3/uL (ref 140–400)
RBC: 4.21 MIL/uL (ref 4.20–5.80)
RDW: 13.8 % (ref 11.0–15.0)
WBC: 8.3 10*3/uL (ref 3.8–10.8)

## 2016-04-22 LAB — COMPREHENSIVE METABOLIC PANEL
ALT: 26 U/L (ref 9–46)
AST: 29 U/L (ref 10–35)
Albumin: 4.5 g/dL (ref 3.6–5.1)
Alkaline Phosphatase: 93 U/L (ref 40–115)
BUN: 15 mg/dL (ref 7–25)
CO2: 25 mmol/L (ref 20–31)
Calcium: 9.7 mg/dL (ref 8.6–10.3)
Chloride: 104 mmol/L (ref 98–110)
Creat: 0.99 mg/dL (ref 0.70–1.25)
Glucose, Bld: 206 mg/dL — ABNORMAL HIGH (ref 65–99)
Potassium: 4.7 mmol/L (ref 3.5–5.3)
Sodium: 140 mmol/L (ref 135–146)
Total Bilirubin: 1.4 mg/dL — ABNORMAL HIGH (ref 0.2–1.2)
Total Protein: 7.5 g/dL (ref 6.1–8.1)

## 2016-04-22 LAB — LIPID PANEL
Cholesterol: 130 mg/dL (ref <200)
HDL: 37 mg/dL — ABNORMAL LOW (ref >40)
Total CHOL/HDL Ratio: 3.5 Ratio (ref <5.0)
Triglycerides: 411 mg/dL — ABNORMAL HIGH (ref <150)

## 2016-04-23 LAB — RPR

## 2016-04-24 LAB — T-HELPER CELL (CD4) - (RCID CLINIC ONLY)
CD4 % Helper T Cell: 19 % — ABNORMAL LOW (ref 33–55)
CD4 T Cell Abs: 830 /uL (ref 400–2700)

## 2016-04-25 LAB — URINE CYTOLOGY ANCILLARY ONLY
Chlamydia: NEGATIVE
Neisseria Gonorrhea: NEGATIVE

## 2016-04-27 LAB — HIV-1 RNA QUANT-NO REFLEX-BLD
HIV 1 RNA Quant: <20 copies/mL
HIV-1 RNA Quant, Log: <1.3 Log copies/mL

## 2016-04-29 DIAGNOSIS — E669 Obesity, unspecified: Secondary | ICD-10-CM | POA: Insufficient documentation

## 2016-04-29 DIAGNOSIS — M1A39X0 Chronic gout due to renal impairment, multiple sites, without tophus (tophi): Secondary | ICD-10-CM | POA: Insufficient documentation

## 2016-04-29 DIAGNOSIS — N189 Chronic kidney disease, unspecified: Secondary | ICD-10-CM | POA: Insufficient documentation

## 2016-04-29 DIAGNOSIS — Z860101 Personal history of adenomatous and serrated colon polyps: Secondary | ICD-10-CM | POA: Insufficient documentation

## 2016-04-29 DIAGNOSIS — K760 Fatty (change of) liver, not elsewhere classified: Secondary | ICD-10-CM | POA: Insufficient documentation

## 2016-04-29 DIAGNOSIS — Z86010 Personal history of colonic polyps: Secondary | ICD-10-CM | POA: Insufficient documentation

## 2016-05-05 ENCOUNTER — Encounter: Payer: Self-pay | Admitting: Infectious Diseases

## 2016-05-05 ENCOUNTER — Ambulatory Visit (INDEPENDENT_AMBULATORY_CARE_PROVIDER_SITE_OTHER): Payer: Managed Care, Other (non HMO) | Admitting: Infectious Diseases

## 2016-05-05 VITALS — BP 134/77 | HR 82 | Temp 98.2°F | Ht 69.0 in | Wt 222.0 lb

## 2016-05-05 DIAGNOSIS — B20 Human immunodeficiency virus [HIV] disease: Secondary | ICD-10-CM

## 2016-05-05 DIAGNOSIS — E7849 Other hyperlipidemia: Secondary | ICD-10-CM

## 2016-05-05 DIAGNOSIS — F4310 Post-traumatic stress disorder, unspecified: Secondary | ICD-10-CM

## 2016-05-05 DIAGNOSIS — E784 Other hyperlipidemia: Secondary | ICD-10-CM

## 2016-05-05 DIAGNOSIS — E118 Type 2 diabetes mellitus with unspecified complications: Secondary | ICD-10-CM

## 2016-05-05 NOTE — Progress Notes (Signed)
   Subjective:    Patient ID: [name], male    DOB: [birth year], 66 y.o.   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 He has been maintained on atripla --> changed to [facility] 2017.   He was adm 64-21 to 01-04-2016 with pyelonephritis and klebsiella bacteremia. He was treated with ceftriaxone and cipro.  Since he had multiple questions about his "sepsis". Was scared he would not live.  Has had fatigue since. More careful, wants to sleep, be safe more. Feels volnerable.   HIV 1 RNA Quant (copies/mL)  Date Value  04/22/2016 <20 NOT DETECTED  09/03/2015 <20  04/24/2015 <20   CD4 T Cell Abs (/uL)  Date Value  04/22/2016 830  01/02/2016 600  09/03/2015 810   Has been trying different rx for his DM.   Review of Systems  Constitutional: Positive for fatigue. Negative for appetite change, chills, fever and unexpected weight change.  Respiratory: Negative for cough and shortness of breath.   Cardiovascular: Negative for leg swelling.  Gastrointestinal: Negative for constipation and diarrhea.  Genitourinary: Negative for difficulty urinating and dysuria.  Neurological: Positive for numbness. Negative for light-headedness.   Colon 05-27-16    Objective:   Physical Exam  Constitutional: He appears well-developed and well-nourished.  HENT:  Mouth/Throat: No oropharyngeal exudate.  Eyes: EOM are normal. Pupils are equal, round, and reactive to light.  Neck: Neck supple.  Cardiovascular: Normal rate, regular rhythm and normal heart sounds.   Pulmonary/Chest: Effort normal and breath sounds normal.  Abdominal: Soft. Bowel sounds are normal. There is no tenderness. There is no rebound.  Musculoskeletal: He exhibits edema.  Lymphadenopathy:    He has no cervical adenopathy.          Assessment  & Plan:

## 2016-05-05 NOTE — Assessment & Plan Note (Signed)
PTSD is not uncommon among sepsis survivors.  Will get him in with counselor here.

## 2016-05-05 NOTE — Assessment & Plan Note (Signed)
Mild elevation of bili.  Will continue to watch.  LFTs normal.

## 2016-05-05 NOTE — Assessment & Plan Note (Signed)
He is doing well.  Will f/u in 6 months. prevnar at pharmacy.  Colon via pcp in next 6 months.  Offered/refused condoms.

## 2016-05-05 NOTE — Assessment & Plan Note (Signed)
Being followed by PCP.  Chol good, his Trig remains quite high.

## 2016-05-05 NOTE — Assessment & Plan Note (Signed)
Doing well but still changing his meds.  Appreciate PCP f/u.

## 2016-05-14 ENCOUNTER — Ambulatory Visit: Payer: Managed Care, Other (non HMO)

## 2016-06-24 ENCOUNTER — Encounter: Payer: Self-pay | Admitting: Podiatry

## 2016-06-24 ENCOUNTER — Ambulatory Visit (INDEPENDENT_AMBULATORY_CARE_PROVIDER_SITE_OTHER): Payer: Managed Care, Other (non HMO) | Admitting: Podiatry

## 2016-06-24 DIAGNOSIS — M79674 Pain in right toe(s): Secondary | ICD-10-CM

## 2016-06-24 DIAGNOSIS — B351 Tinea unguium: Secondary | ICD-10-CM

## 2016-06-24 DIAGNOSIS — M79675 Pain in left toe(s): Secondary | ICD-10-CM

## 2016-06-24 DIAGNOSIS — E1149 Type 2 diabetes mellitus with other diabetic neurological complication: Secondary | ICD-10-CM

## 2016-06-24 MED ORDER — GABAPENTIN 300 MG PO CAPS: 300.0000 mg | ORAL_CAPSULE | Freq: Every day | ORAL | 3 refills | Status: DC

## 2016-06-24 NOTE — Progress Notes (Signed)
   Subjective:    Patient ID: [name]., male    DOB: 06/04/1950, 66 y.o.   MRN: [id number]  HPI 66 year old male presents the office today for concerns of numbness/tingling to his feet mostly his toes at night. He states that he has difficulty sleeping at times because of this. He was on gabapentin 300mg  previously after back surgery and this helped but he has not been on it for several years. He denies any claudication symptoms. He has a states his nails are thick, painful, elongated he cannot trim himself. Denies any redness or drainage. He denies any claudication symptoms. He has no other complaints today.  Review of Systems  All other systems reviewed and are negative.      Objective:   Physical Exam General: AAO x3, NAD  Dermatological: Nails are hypertrophic, dystrophic, brittle, discolored, elongated 10. No surrounding redness or drainage. Tenderness nails 1-5 bilaterally. No open lesions or pre-ulcerative lesions are identified today.  Vascular: Dorsalis Pedis artery and Posterior Tibial artery pedal pulses are 2/4 bilateral with immedate capillary fill time. There is no pain with calf compression, swelling, warmth, erythema.   Neruologic: Sensation decreased with Derrel Nip monofilament.  Musculoskeletal: No gross boney pedal deformities bilateral. No pain, crepitus, or limitation noted with foot and ankle range of motion bilateral. Muscular strength 5/5 in all groups tested bilateral.  Gait: Unassisted, Nonantalgic.      Assessment & Plan:  66 year old male with symptomatic onychomycosis, neuropathy -Treatment options discussed including all alternatives, risks, and complications -Etiology of symptoms were discussed -Nails debrided 10 without complications or bleeding. -Will restart gabapentin 300 mg at nighttime. Discussed side effects the medication. We'll check in the next 4-6 weeks to see a was doing for this. -Daily foot inspection -Follow-up in 3  months or sooner if any problems arise. In the meantime, encouraged to call the office with any questions, concerns, change in symptoms.   Celesta Gentile, DPM

## 2016-06-24 NOTE — Patient Instructions (Signed)
Diabetes and Foot Care Diabetes may cause you to have problems because of poor blood supply (circulation) to your feet and legs. This may cause the skin on your feet to become thinner, break easier, and heal more slowly. Your skin may become dry, and the skin may peel and crack. You may also have nerve damage in your legs and feet causing decreased feeling in them. You may not notice minor injuries to your feet that could lead to infections or more serious problems. Taking care of your feet is one of the most important things you can do for yourself. Follow these instructions at home:  Wear shoes at all times, even in the house. Do not go barefoot. Bare feet are easily injured.  Check your feet daily for blisters, cuts, and redness. If you cannot see the bottom of your feet, use a mirror or ask someone for help.  Wash your feet with warm water (do not use hot water) and mild soap. Then pat your feet and the areas between your toes until they are completely dry. Do not soak your feet as this can dry your skin.  Apply a moisturizing lotion or petroleum jelly (that does not contain alcohol and is unscented) to the skin on your feet and to dry, brittle toenails. Do not apply lotion between your toes.  Trim your toenails straight across. Do not dig under them or around the cuticle. File the edges of your nails with an emery board or nail file.  Do not cut corns or calluses or try to remove them with medicine.  Wear clean socks or stockings every day. Make sure they are not too tight. Do not wear knee-high stockings since they may decrease blood flow to your legs.  Wear shoes that fit properly and have enough cushioning. To break in new shoes, wear them for just a few hours a day. This prevents you from injuring your feet. Always look in your shoes before you put them on to be sure there are no objects inside.  Do not cross your legs. This may decrease the blood flow to your feet.  If you find a  minor scrape, cut, or break in the skin on your feet, keep it and the skin around it clean and dry. These areas may be cleansed with mild soap and water. Do not cleanse the area with peroxide, alcohol, or iodine.  When you remove an adhesive bandage, be sure not to damage the skin around it.  If you have a wound, look at it several times a day to make sure it is healing.  Do not use heating pads or hot water bottles. They may burn your skin. If you have lost feeling in your feet or legs, you may not know it is happening until it is too late.  Make sure your health care provider performs a complete foot exam at least annually or more often if you have foot problems. Report any cuts, sores, or bruises to your health care provider immediately. Contact a health care provider if:  You have an injury that is not healing.  You have cuts or breaks in the skin.  You have an ingrown nail.  You notice redness on your legs or feet.  You feel burning or tingling in your legs or feet.  You have pain or cramps in your legs and feet.  Your legs or feet are numb.  Your feet always feel cold. Get help right away if:  There is increasing   redness, swelling, or pain in or around a wound.  There is a red line that goes up your leg.  Pus is coming from a wound.  You develop a fever or as directed by your health care provider.  You notice a bad smell coming from an ulcer or wound. This information is not intended to replace advice given to you by your health care provider. Make sure you discuss any questions you have with your health care provider. Document Released: 02/22/2000 Document Revised: 08/02/2015 Document Reviewed: 08/03/2012 Elsevier Interactive Patient Education  2017 [place].   Gabapentin capsules or tablets What is this medicine? GABAPENTIN (GA ba pen tin) is used to control partial seizures in adults with epilepsy. It is also used to treat certain types of nerve pain. This  medicine may be used for other purposes; ask your health care provider or pharmacist if you have questions. COMMON BRAND NAME(S): Active-PAC with Gabapentin, Gabarone, Neurontin What should I tell my health care provider before I take this medicine? They need to know if you have any of these conditions: -kidney disease -suicidal thoughts, plans, or attempt; a previous suicide attempt by you or a family member -an unusual or allergic reaction to gabapentin, other medicines, foods, dyes, or preservatives -pregnant or trying to get pregnant -breast-feeding How should I use this medicine? Take this medicine by mouth with a glass of water. Follow the directions on the prescription label. You can take it with or without food. If it upsets your stomach, take it with food.Take your medicine at regular intervals. Do not take it more often than directed. Do not stop taking except on your doctor's advice. If you are directed to break the 600 or 800 mg tablets in half as part of your dose, the extra half tablet should be used for the next dose. If you have not used the extra half tablet within 28 days, it should be thrown away. A special MedGuide will be given to you by the pharmacist with each prescription and refill. Be sure to read this information carefully each time. Talk to your pediatrician regarding the use of this medicine in children. Special care may be needed. Overdosage: If you think you have taken too much of this medicine contact a poison control center or emergency room at once. NOTE: This medicine is only for you. Do not share this medicine with others. What if I miss a dose? If you miss a dose, take it as soon as you can. If it is almost time for your next dose, take only that dose. Do not take double or extra doses. What may interact with this medicine? Do not take this medicine with any of the following medications: -other gabapentin products This medicine may also interact with the  following medications: -alcohol -antacids -antihistamines for allergy, cough and cold -certain medicines for anxiety or sleep -certain medicines for depression or psychotic disturbances -homatropine; hydrocodone -naproxen -narcotic medicines (opiates) for pain -phenothiazines like chlorpromazine, mesoridazine, prochlorperazine, thioridazine This list may not describe all possible interactions. Give your health care provider a list of all the medicines, herbs, non-prescription drugs, or dietary supplements you use. Also tell them if you smoke, drink alcohol, or use illegal drugs. Some items may interact with your medicine. What should I watch for while using this medicine? Visit your doctor or health care professional for regular checks on your progress. You may want to keep a record at home of how you feel your condition is responding to treatment.  You may want to share this information with your doctor or health care professional at each visit. You should contact your doctor or health care professional if your seizures get worse or if you have any new types of seizures. Do not stop taking this medicine or any of your seizure medicines unless instructed by your doctor or health care professional. Stopping your medicine suddenly can increase your seizures or their severity. Wear a medical identification bracelet or chain if you are taking this medicine for seizures, and carry a card that lists all your medications. You may get drowsy, dizzy, or have blurred vision. Do not drive, use machinery, or do anything that needs mental alertness until you know how this medicine affects you. To reduce dizzy or fainting spells, do not sit or stand up quickly, especially if you are an older patient. Alcohol can increase drowsiness and dizziness. Avoid alcoholic drinks. Your mouth may get dry. Chewing sugarless gum or sucking hard candy, and drinking plenty of water will help. The use of this medicine may increase  the chance of suicidal thoughts or actions. Pay special attention to how you are responding while on this medicine. Any worsening of mood, or thoughts of suicide or dying should be reported to your health care professional right away. Women who become pregnant while using this medicine may enroll in the [place] Pregnancy Registry by calling [phone]. This registry collects information about the safety of antiepileptic drug use during pregnancy. What side effects may I notice from receiving this medicine? Side effects that you should report to your doctor or health care professional as soon as possible: -allergic reactions like skin rash, itching or hives, swelling of the face, lips, or tongue -worsening of mood, thoughts or actions of suicide or dying Side effects that usually do not require medical attention (report to your doctor or health care professional if they continue or are bothersome): -constipation -difficulty walking or controlling muscle movements -dizziness -nausea -slurred speech -tiredness -tremors -weight gain This list may not describe all possible side effects. Call your doctor for medical advice about side effects. You may report side effects to FDA at 1-800-FDA-1088. Where should I keep my medicine? Keep out of reach of children. This medicine may cause accidental overdose and death if it taken by other adults, children, or pets. Mix any unused medicine with a substance like cat litter or coffee grounds. Then throw the medicine away in a sealed container like a sealed bag or a coffee can with a lid. Do not use the medicine after the expiration date. Store at room temperature between 15 and 30 degrees C (59 and 86 degrees F). NOTE: This sheet is a summary. It may not cover all possible information. If you have questions about this medicine, talk to your doctor, pharmacist, or health care provider.  2018 Elsevier/Gold Standard (2013-04-22  15:26:50)

## 2016-06-25 ENCOUNTER — Other Ambulatory Visit: Payer: Self-pay | Admitting: Infectious Diseases

## 2016-06-25 DIAGNOSIS — B20 Human immunodeficiency virus [HIV] disease: Secondary | ICD-10-CM

## 2016-08-05 ENCOUNTER — Encounter: Payer: Self-pay | Admitting: Podiatry

## 2016-08-05 ENCOUNTER — Ambulatory Visit (INDEPENDENT_AMBULATORY_CARE_PROVIDER_SITE_OTHER): Payer: Managed Care, Other (non HMO) | Admitting: Podiatry

## 2016-08-05 DIAGNOSIS — E1149 Type 2 diabetes mellitus with other diabetic neurological complication: Secondary | ICD-10-CM | POA: Diagnosis not present

## 2016-08-05 MED ORDER — GABAPENTIN 300 MG PO CAPS: 300.0000 mg | ORAL_CAPSULE | Freq: Two times a day (BID) | ORAL | 3 refills | Status: DC

## 2016-08-05 NOTE — Progress Notes (Signed)
Subjective: Mr. Ciolek presents to the office today for follow-up evaluation of foot pain/neuropathy. He states that since starting the gabapentin he has noticed about 25-30% improvement. He is currently taking 300mg  at night time and doing well with this test. He has noticed improvement symptoms. Pinprick sensation is decreased however a puffy sensation still present. No recent injury or change since last appointment.  Denies any systemic complaints such as fevers, chills, nausea, vomiting. No acute changes since last appointment, and no other complaints at this time.   Objective: AAO x3, NAD DP/PT pulses palpable bilaterally, CRT less than 3 seconds Sensation decreased with Simms Weinstein monofilament. There is no area of tenderness to bilateral lower extremities. Cavus foot that is present with plantar flexion metatarsal heads has no pain. No open lesions or pre-ulcerative lesions.  No pain with calf compression, swelling, warmth, erythema  Assessment: Neuropathy, improvement  Plan: -All treatment options discussed with the patient including all alternatives, risks, complications.  -Discussed increasing gabapentin 300 mg twice a day physical tolerated. He can discussed side effects the medication. We will see how he does at this visit next couple weeks and I encouraged him to call the office immediately is doing. If symptoms are still persistent and he is time the medication well will likely increase 3 times a day. -Follow-up in 3-6 months or sooner if needed. -Patient encouraged to call the office with any questions, concerns, change in symptoms.   Celesta Gentile, DPM

## 2016-08-05 NOTE — Patient Instructions (Signed)
Gabapentin capsules or tablets What is this medicine? GABAPENTIN (GA ba pen tin) is used to control partial seizures in adults with epilepsy. It is also used to treat certain types of nerve pain. This medicine may be used for other purposes; ask your health care provider or pharmacist if you have questions. COMMON BRAND NAME(S): Active-PAC with Gabapentin, Gabarone, Neurontin What should I tell my health care provider before I take this medicine? They need to know if you have any of these conditions: -kidney disease -suicidal thoughts, plans, or attempt; a previous suicide attempt by you or a family member -an unusual or allergic reaction to gabapentin, other medicines, foods, dyes, or preservatives -pregnant or trying to get pregnant -breast-feeding How should I use this medicine? Take this medicine by mouth with a glass of water. Follow the directions on the prescription label. You can take it with or without food. If it upsets your stomach, take it with food.Take your medicine at regular intervals. Do not take it more often than directed. Do not stop taking except on your doctor's advice. If you are directed to break the 600 or 800 mg tablets in half as part of your dose, the extra half tablet should be used for the next dose. If you have not used the extra half tablet within 28 days, it should be thrown away. A special MedGuide will be given to you by the pharmacist with each prescription and refill. Be sure to read this information carefully each time. Talk to your pediatrician regarding the use of this medicine in children. Special care may be needed. Overdosage: If you think you have taken too much of this medicine contact a poison control center or emergency room at once. NOTE: This medicine is only for you. Do not share this medicine with others. What if I miss a dose? If you miss a dose, take it as soon as you can. If it is almost time for your next dose, take only that dose. Do not  take double or extra doses. What may interact with this medicine? Do not take this medicine with any of the following medications: -other gabapentin products This medicine may also interact with the following medications: -alcohol -antacids -antihistamines for allergy, cough and cold -certain medicines for anxiety or sleep -certain medicines for depression or psychotic disturbances -homatropine; hydrocodone -naproxen -narcotic medicines (opiates) for pain -phenothiazines like chlorpromazine, mesoridazine, prochlorperazine, thioridazine This list may not describe all possible interactions. Give your health care provider a list of all the medicines, herbs, non-prescription drugs, or dietary supplements you use. Also tell them if you smoke, drink alcohol, or use illegal drugs. Some items may interact with your medicine. What should I watch for while using this medicine? Visit your doctor or health care professional for regular checks on your progress. You may want to keep a record at home of how you feel your condition is responding to treatment. You may want to share this information with your doctor or health care professional at each visit. You should contact your doctor or health care professional if your seizures get worse or if you have any new types of seizures. Do not stop taking this medicine or any of your seizure medicines unless instructed by your doctor or health care professional. Stopping your medicine suddenly can increase your seizures or their severity. Wear a medical identification bracelet or chain if you are taking this medicine for seizures, and carry a card that lists all your medications. You may get drowsy, dizzy,   or have blurred vision. Do not drive, use machinery, or do anything that needs mental alertness until you know how this medicine affects you. To reduce dizzy or fainting spells, do not sit or stand up quickly, especially if you are an older patient. Alcohol can  increase drowsiness and dizziness. Avoid alcoholic drinks. Your mouth may get dry. Chewing sugarless gum or sucking hard candy, and drinking plenty of water will help. The use of this medicine may increase the chance of suicidal thoughts or actions. Pay special attention to how you are responding while on this medicine. Any worsening of mood, or thoughts of suicide or dying should be reported to your health care professional right away. Women who become pregnant while using this medicine may enroll in the [place] American Antiepileptic Drug Pregnancy Registry by calling 1-888-233-2334. This registry collects information about the safety of antiepileptic drug use during pregnancy. What side effects may I notice from receiving this medicine? Side effects that you should report to your doctor or health care professional as soon as possible: -allergic reactions like skin rash, itching or hives, swelling of the face, lips, or tongue -worsening of mood, thoughts or actions of suicide or dying Side effects that usually do not require medical attention (report to your doctor or health care professional if they continue or are bothersome): -constipation -difficulty walking or controlling muscle movements -dizziness -nausea -slurred speech -tiredness -tremors -weight gain This list may not describe all possible side effects. Call your doctor for medical advice about side effects. You may report side effects to FDA at 1-800-FDA-1088. Where should I keep my medicine? Keep out of reach of children. This medicine may cause accidental overdose and death if it taken by other adults, children, or pets. Mix any unused medicine with a substance like cat litter or coffee grounds. Then throw the medicine away in a sealed container like a sealed bag or a coffee can with a lid. Do not use the medicine after the expiration date. Store at room temperature between 15 and 30 degrees C (59 and 86 degrees F). NOTE: This  sheet is a summary. It may not cover all possible information. If you have questions about this medicine, talk to your doctor, pharmacist, or health care provider.  2018 Elsevier/Gold Standard (2013-04-22 15:26:50)  

## 2016-09-05 ENCOUNTER — Encounter: Payer: Self-pay | Admitting: Podiatry

## 2016-10-15 ENCOUNTER — Other Ambulatory Visit: Payer: Self-pay | Admitting: Podiatry

## 2016-10-15 DIAGNOSIS — R809 Proteinuria, unspecified: Secondary | ICD-10-CM | POA: Diagnosis not present

## 2016-10-15 DIAGNOSIS — N181 Chronic kidney disease, stage 1: Secondary | ICD-10-CM | POA: Diagnosis not present

## 2016-10-15 DIAGNOSIS — E1129 Type 2 diabetes mellitus with other diabetic kidney complication: Secondary | ICD-10-CM | POA: Diagnosis not present

## 2016-10-15 DIAGNOSIS — I129 Hypertensive chronic kidney disease with stage 1 through stage 4 chronic kidney disease, or unspecified chronic kidney disease: Secondary | ICD-10-CM | POA: Diagnosis not present

## 2016-10-16 NOTE — Telephone Encounter (Signed)
Pt needs an appt prior to future refills. 

## 2016-10-21 ENCOUNTER — Encounter: Payer: Self-pay | Admitting: Infectious Diseases

## 2016-10-21 ENCOUNTER — Other Ambulatory Visit: Payer: Self-pay | Admitting: *Deleted

## 2016-10-21 DIAGNOSIS — E291 Testicular hypofunction: Secondary | ICD-10-CM | POA: Diagnosis not present

## 2016-10-21 DIAGNOSIS — B20 Human immunodeficiency virus [HIV] disease: Secondary | ICD-10-CM

## 2016-10-21 MED ORDER — EMTRICITAB-RILPIVIR-TENOFOV AF 200-25-25 MG PO TABS: 1.0000 | ORAL_TABLET | Freq: Every day | ORAL | 4 refills | Status: DC

## 2016-11-06 DIAGNOSIS — R5383 Other fatigue: Secondary | ICD-10-CM | POA: Diagnosis not present

## 2016-11-06 DIAGNOSIS — E118 Type 2 diabetes mellitus with unspecified complications: Secondary | ICD-10-CM | POA: Diagnosis not present

## 2016-11-06 DIAGNOSIS — E785 Hyperlipidemia, unspecified: Secondary | ICD-10-CM | POA: Diagnosis not present

## 2016-11-17 DIAGNOSIS — Z23 Encounter for immunization: Secondary | ICD-10-CM | POA: Diagnosis not present

## 2016-11-17 DIAGNOSIS — I10 Essential (primary) hypertension: Secondary | ICD-10-CM | POA: Diagnosis not present

## 2016-11-17 DIAGNOSIS — M109 Gout, unspecified: Secondary | ICD-10-CM | POA: Diagnosis not present

## 2016-11-17 DIAGNOSIS — E119 Type 2 diabetes mellitus without complications: Secondary | ICD-10-CM | POA: Diagnosis not present

## 2016-11-29 ENCOUNTER — Other Ambulatory Visit: Payer: Self-pay | Admitting: Podiatry

## 2016-12-04 DIAGNOSIS — M1811 Unilateral primary osteoarthritis of first carpometacarpal joint, right hand: Secondary | ICD-10-CM | POA: Diagnosis not present

## 2016-12-04 DIAGNOSIS — M79641 Pain in right hand: Secondary | ICD-10-CM | POA: Diagnosis not present

## 2016-12-16 DIAGNOSIS — E291 Testicular hypofunction: Secondary | ICD-10-CM | POA: Diagnosis not present

## 2016-12-29 DIAGNOSIS — Z794 Long term (current) use of insulin: Secondary | ICD-10-CM | POA: Diagnosis not present

## 2016-12-29 DIAGNOSIS — E118 Type 2 diabetes mellitus with unspecified complications: Secondary | ICD-10-CM | POA: Diagnosis not present

## 2016-12-29 DIAGNOSIS — B20 Human immunodeficiency virus [HIV] disease: Secondary | ICD-10-CM | POA: Diagnosis not present

## 2016-12-29 DIAGNOSIS — I10 Essential (primary) hypertension: Secondary | ICD-10-CM | POA: Diagnosis not present

## 2017-01-05 ENCOUNTER — Other Ambulatory Visit: Payer: Self-pay | Admitting: Podiatry

## 2017-01-05 NOTE — Telephone Encounter (Signed)
Pt needs an appt prior to future refills. 

## 2017-01-06 DIAGNOSIS — E291 Testicular hypofunction: Secondary | ICD-10-CM | POA: Diagnosis not present

## 2017-01-27 DIAGNOSIS — E119 Type 2 diabetes mellitus without complications: Secondary | ICD-10-CM | POA: Diagnosis not present

## 2017-01-27 DIAGNOSIS — E291 Testicular hypofunction: Secondary | ICD-10-CM | POA: Diagnosis not present

## 2017-02-24 DIAGNOSIS — E291 Testicular hypofunction: Secondary | ICD-10-CM | POA: Diagnosis not present

## 2017-03-16 ENCOUNTER — Other Ambulatory Visit: Payer: Self-pay | Admitting: *Deleted

## 2017-03-16 DIAGNOSIS — B20 Human immunodeficiency virus [HIV] disease: Secondary | ICD-10-CM

## 2017-03-16 MED ORDER — EMTRICITAB-RILPIVIR-TENOFOV AF 200-25-25 MG PO TABS: 1.0000 | ORAL_TABLET | Freq: Every day | ORAL | 0 refills | Status: DC

## 2017-03-17 DIAGNOSIS — E291 Testicular hypofunction: Secondary | ICD-10-CM | POA: Diagnosis not present

## 2017-04-01 ENCOUNTER — Other Ambulatory Visit: Payer: BLUE CROSS/BLUE SHIELD

## 2017-04-01 DIAGNOSIS — B20 Human immunodeficiency virus [HIV] disease: Secondary | ICD-10-CM

## 2017-04-02 DIAGNOSIS — I10 Essential (primary) hypertension: Secondary | ICD-10-CM | POA: Diagnosis not present

## 2017-04-02 DIAGNOSIS — E118 Type 2 diabetes mellitus with unspecified complications: Secondary | ICD-10-CM | POA: Diagnosis not present

## 2017-04-02 LAB — COMPLETE METABOLIC PANEL WITH GFR
AG Ratio: 1.6 (calc) (ref 1.0–2.5)
ALT: 39 U/L (ref 9–46)
AST: 54 U/L — ABNORMAL HIGH (ref 10–35)
Albumin: 4.5 g/dL (ref 3.6–5.1)
Alkaline phosphatase (APISO): 95 U/L (ref 40–115)
BUN: 19 mg/dL (ref 7–25)
CO2: 25 mmol/L (ref 20–32)
Calcium: 9.6 mg/dL (ref 8.6–10.3)
Chloride: 103 mmol/L (ref 98–110)
Creat: 1.06 mg/dL (ref 0.70–1.25)
GFR, Est African American: 85 mL/min/{1.73_m2} (ref >=60)
GFR, Est Non African American: 73 mL/min/{1.73_m2} (ref >=60)
Globulin: 2.8 g/dL (calc) (ref 1.9–3.7)
Glucose, Bld: 236 mg/dL — ABNORMAL HIGH (ref 65–99)
Potassium: 4.7 mmol/L (ref 3.5–5.3)
Sodium: 137 mmol/L (ref 135–146)
Total Bilirubin: 1.9 mg/dL — ABNORMAL HIGH (ref 0.2–1.2)
Total Protein: 7.3 g/dL (ref 6.1–8.1)

## 2017-04-02 LAB — CBC
HCT: 43.7 % (ref 38.5–50.0)
Hemoglobin: 15.5 g/dL (ref 13.2–17.1)
MCH: 33.9 pg — ABNORMAL HIGH (ref 27.0–33.0)
MCHC: 35.5 g/dL (ref 32.0–36.0)
MCV: 95.6 fL (ref 80.0–100.0)
MPV: 11 fL (ref 7.5–12.5)
Platelets: 189 10*3/uL (ref 140–400)
RBC: 4.57 10*6/uL (ref 4.20–5.80)
RDW: 12.3 % (ref 11.0–15.0)
WBC: 8.6 10*3/uL (ref 3.8–10.8)

## 2017-04-03 LAB — T-HELPER CELL (CD4) - (RCID CLINIC ONLY)
CD4 % Helper T Cell: 15 % — ABNORMAL LOW (ref 33–55)
CD4 T Cell Abs: 690 /uL (ref 400–2700)

## 2017-04-03 LAB — HIV-1 RNA QUANT-NO REFLEX-BLD
HIV 1 RNA Quant: <20 copies/mL
HIV-1 RNA Quant, Log: <1.3 Log copies/mL

## 2017-04-08 ENCOUNTER — Other Ambulatory Visit: Payer: Self-pay | Admitting: Infectious Diseases

## 2017-04-08 DIAGNOSIS — B20 Human immunodeficiency virus [HIV] disease: Secondary | ICD-10-CM

## 2017-04-09 DIAGNOSIS — E291 Testicular hypofunction: Secondary | ICD-10-CM | POA: Diagnosis not present

## 2017-04-09 DIAGNOSIS — E785 Hyperlipidemia, unspecified: Secondary | ICD-10-CM | POA: Diagnosis not present

## 2017-04-09 DIAGNOSIS — E118 Type 2 diabetes mellitus with unspecified complications: Secondary | ICD-10-CM | POA: Diagnosis not present

## 2017-04-15 ENCOUNTER — Ambulatory Visit: Payer: BLUE CROSS/BLUE SHIELD | Admitting: Infectious Diseases

## 2017-04-15 VITALS — BP 147/84 | HR 94 | Temp 98.0°F | Wt 228.0 lb

## 2017-04-15 DIAGNOSIS — Z23 Encounter for immunization: Secondary | ICD-10-CM

## 2017-04-15 DIAGNOSIS — B20 Human immunodeficiency virus [HIV] disease: Secondary | ICD-10-CM

## 2017-04-15 DIAGNOSIS — Z113 Encounter for screening for infections with a predominantly sexual mode of transmission: Secondary | ICD-10-CM | POA: Diagnosis not present

## 2017-04-15 DIAGNOSIS — E118 Type 2 diabetes mellitus with unspecified complications: Secondary | ICD-10-CM | POA: Diagnosis not present

## 2017-04-15 DIAGNOSIS — Z79899 Other long term (current) drug therapy: Secondary | ICD-10-CM | POA: Diagnosis not present

## 2017-04-15 MED ORDER — EMTRICITAB-RILPIVIR-TENOFOV AF 200-25-25 MG PO TABS
1.0000 | ORAL_TABLET | Freq: Every day | ORAL | 12 refills | Status: DC
Start: 2017-04-15 — End: 2018-04-13

## 2017-04-15 NOTE — Assessment & Plan Note (Signed)
He is doing well A1C 7.7% 2 weeks ago.  Will continue f/u with his PCP Will have podiatry eval.  Has had eye exam in last 3 months

## 2017-04-15 NOTE — Addendum Note (Signed)
Addended by: Aundria Rud on: 04/15/2017 04:06 PM   Modules accepted: Orders

## 2017-04-15 NOTE — Assessment & Plan Note (Signed)
Has gotten flu vax Has gotten shingles vax Will get Tet booster No change in medications.  Taking with food Offered/refused condoms.  Will see back in 9 months.

## 2017-04-15 NOTE — Progress Notes (Signed)
   Subjective:    Patient ID: Joshua Oman., male    DOB: 1950-09-18, 67 y.o.   MRN: [id number]  HPI 67 yo M with hx of hemochromatosis, DM2 (dx 2007), secondary hypogonadism (on clomid), CKD3, hyperlipidemia, lumbar laminectomy/decompression (04-2011), and HIV+ since 1990 when he was dx on insurance physical. He has been maintained on [place] -->changed to [facility] 2017.   He was adm 10-21 to 01-04-2016 with pyelonephritis and klebsiella bacteremia. He was treated with ceftriaxone and cipro.  Believes his FSG have been more difficult to control since sepsis.  He has had some issues with hyperglycemia however has been started on new rx (Jardiance).  FSG since have been in 140-150s.   Stays busy with work, would like to work til he is 55  HIV 1 RNA Quant (copies/mL)  Date Value  04/01/2017 <20 NOT DETECTED  04/22/2016 <20 NOT DETECTED  09/03/2015 <20   CD4 T Cell Abs (/uL)  Date Value  04/01/2017 690  04/22/2016 830  01/02/2016 600    Review of Systems  Constitutional: Negative for appetite change, chills, fever and unexpected weight change.  Respiratory: Negative for shortness of breath.   Cardiovascular: Negative for chest pain and leg swelling.  Gastrointestinal: Negative for constipation and diarrhea.  Endocrine: Positive for polyuria.  Genitourinary: Negative for difficulty urinating and dysuria.  Neurological: Positive for numbness.  Psychiatric/Behavioral: Negative for dysphoric mood and sleep disturbance.  has been drinking more fluids.      Objective:   Physical Exam  Constitutional: He appears well-developed and well-nourished.  HENT:  Mouth/Throat: No oropharyngeal exudate.  Eyes: EOM are normal. Pupils are equal, round, and reactive to light.  Neck: Neck supple.  Cardiovascular: Normal rate, regular rhythm and normal heart sounds.  Pulmonary/Chest: Effort normal and breath sounds normal.  Abdominal: Soft. Bowel sounds are normal. There is no tenderness.  There is no rebound.  Musculoskeletal: He exhibits no edema.       Feet:  Lymphadenopathy:    He has no cervical adenopathy.  Psychiatric: He has a normal mood and affect.       Assessment & Plan:

## 2017-05-19 DIAGNOSIS — E291 Testicular hypofunction: Secondary | ICD-10-CM | POA: Diagnosis not present

## 2017-05-27 DIAGNOSIS — E1129 Type 2 diabetes mellitus with other diabetic kidney complication: Secondary | ICD-10-CM | POA: Diagnosis not present

## 2017-05-27 DIAGNOSIS — Z148 Genetic carrier of other disease: Secondary | ICD-10-CM | POA: Diagnosis not present

## 2017-05-27 DIAGNOSIS — I129 Hypertensive chronic kidney disease with stage 1 through stage 4 chronic kidney disease, or unspecified chronic kidney disease: Secondary | ICD-10-CM | POA: Diagnosis not present

## 2017-05-27 DIAGNOSIS — N181 Chronic kidney disease, stage 1: Secondary | ICD-10-CM | POA: Diagnosis not present

## 2017-05-27 DIAGNOSIS — R809 Proteinuria, unspecified: Secondary | ICD-10-CM | POA: Diagnosis not present

## 2017-06-30 DIAGNOSIS — E118 Type 2 diabetes mellitus with unspecified complications: Secondary | ICD-10-CM | POA: Diagnosis not present

## 2017-06-30 DIAGNOSIS — E291 Testicular hypofunction: Secondary | ICD-10-CM | POA: Diagnosis not present

## 2017-07-06 DIAGNOSIS — M109 Gout, unspecified: Secondary | ICD-10-CM | POA: Diagnosis not present

## 2017-07-06 DIAGNOSIS — Z794 Long term (current) use of insulin: Secondary | ICD-10-CM | POA: Diagnosis not present

## 2017-07-06 DIAGNOSIS — E119 Type 2 diabetes mellitus without complications: Secondary | ICD-10-CM | POA: Diagnosis not present

## 2017-07-06 DIAGNOSIS — I10 Essential (primary) hypertension: Secondary | ICD-10-CM | POA: Diagnosis not present

## 2017-07-21 DIAGNOSIS — E291 Testicular hypofunction: Secondary | ICD-10-CM | POA: Diagnosis not present

## 2017-08-31 DIAGNOSIS — M189 Osteoarthritis of first carpometacarpal joint, unspecified: Secondary | ICD-10-CM | POA: Insufficient documentation

## 2017-08-31 DIAGNOSIS — M1811 Unilateral primary osteoarthritis of first carpometacarpal joint, right hand: Secondary | ICD-10-CM | POA: Diagnosis not present

## 2017-08-31 DIAGNOSIS — M79641 Pain in right hand: Secondary | ICD-10-CM | POA: Diagnosis not present

## 2017-10-13 DIAGNOSIS — E291 Testicular hypofunction: Secondary | ICD-10-CM | POA: Diagnosis not present

## 2017-10-30 DIAGNOSIS — R109 Unspecified abdominal pain: Secondary | ICD-10-CM | POA: Diagnosis not present

## 2017-11-03 DIAGNOSIS — E291 Testicular hypofunction: Secondary | ICD-10-CM | POA: Diagnosis not present

## 2017-11-24 DIAGNOSIS — E291 Testicular hypofunction: Secondary | ICD-10-CM | POA: Diagnosis not present

## 2017-12-15 DIAGNOSIS — E291 Testicular hypofunction: Secondary | ICD-10-CM | POA: Diagnosis not present

## 2017-12-15 DIAGNOSIS — Z23 Encounter for immunization: Secondary | ICD-10-CM | POA: Diagnosis not present

## 2018-01-05 DIAGNOSIS — E119 Type 2 diabetes mellitus without complications: Secondary | ICD-10-CM | POA: Diagnosis not present

## 2018-01-05 DIAGNOSIS — I10 Essential (primary) hypertension: Secondary | ICD-10-CM | POA: Diagnosis not present

## 2018-01-05 DIAGNOSIS — M109 Gout, unspecified: Secondary | ICD-10-CM | POA: Diagnosis not present

## 2018-01-05 DIAGNOSIS — Z125 Encounter for screening for malignant neoplasm of prostate: Secondary | ICD-10-CM | POA: Diagnosis not present

## 2018-01-05 DIAGNOSIS — E291 Testicular hypofunction: Secondary | ICD-10-CM | POA: Diagnosis not present

## 2018-01-12 DIAGNOSIS — I10 Essential (primary) hypertension: Secondary | ICD-10-CM | POA: Diagnosis not present

## 2018-01-12 DIAGNOSIS — E119 Type 2 diabetes mellitus without complications: Secondary | ICD-10-CM | POA: Diagnosis not present

## 2018-01-12 DIAGNOSIS — E291 Testicular hypofunction: Secondary | ICD-10-CM | POA: Diagnosis not present

## 2018-01-12 DIAGNOSIS — Z23 Encounter for immunization: Secondary | ICD-10-CM | POA: Diagnosis not present

## 2018-01-12 DIAGNOSIS — M109 Gout, unspecified: Secondary | ICD-10-CM | POA: Diagnosis not present

## 2018-01-16 DIAGNOSIS — S8991XA Unspecified injury of right lower leg, initial encounter: Secondary | ICD-10-CM | POA: Diagnosis not present

## 2018-01-16 DIAGNOSIS — M7989 Other specified soft tissue disorders: Secondary | ICD-10-CM | POA: Diagnosis not present

## 2018-01-16 DIAGNOSIS — S8011XA Contusion of right lower leg, initial encounter: Secondary | ICD-10-CM | POA: Diagnosis not present

## 2018-01-16 DIAGNOSIS — M79661 Pain in right lower leg: Secondary | ICD-10-CM | POA: Diagnosis not present

## 2018-02-09 DIAGNOSIS — F418 Other specified anxiety disorders: Secondary | ICD-10-CM | POA: Diagnosis not present

## 2018-02-09 DIAGNOSIS — E1142 Type 2 diabetes mellitus with diabetic polyneuropathy: Secondary | ICD-10-CM | POA: Diagnosis not present

## 2018-02-11 DIAGNOSIS — I129 Hypertensive chronic kidney disease with stage 1 through stage 4 chronic kidney disease, or unspecified chronic kidney disease: Secondary | ICD-10-CM | POA: Diagnosis not present

## 2018-02-11 DIAGNOSIS — E1129 Type 2 diabetes mellitus with other diabetic kidney complication: Secondary | ICD-10-CM | POA: Diagnosis not present

## 2018-02-11 DIAGNOSIS — R809 Proteinuria, unspecified: Secondary | ICD-10-CM | POA: Diagnosis not present

## 2018-02-11 DIAGNOSIS — N181 Chronic kidney disease, stage 1: Secondary | ICD-10-CM | POA: Diagnosis not present

## 2018-04-09 DIAGNOSIS — I10 Essential (primary) hypertension: Secondary | ICD-10-CM | POA: Diagnosis not present

## 2018-04-09 DIAGNOSIS — E118 Type 2 diabetes mellitus with unspecified complications: Secondary | ICD-10-CM | POA: Diagnosis not present

## 2018-04-13 ENCOUNTER — Other Ambulatory Visit: Payer: Self-pay

## 2018-04-13 DIAGNOSIS — B20 Human immunodeficiency virus [HIV] disease: Secondary | ICD-10-CM

## 2018-04-13 MED ORDER — EMTRICITAB-RILPIVIR-TENOFOV AF 200-25-25 MG PO TABS: 1.0000 | ORAL_TABLET | Freq: Every day | ORAL | 0 refills | Status: DC

## 2018-04-14 DIAGNOSIS — M79641 Pain in right hand: Secondary | ICD-10-CM | POA: Diagnosis not present

## 2018-04-14 DIAGNOSIS — M1811 Unilateral primary osteoarthritis of first carpometacarpal joint, right hand: Secondary | ICD-10-CM | POA: Diagnosis not present

## 2018-04-15 ENCOUNTER — Other Ambulatory Visit: Payer: BLUE CROSS/BLUE SHIELD

## 2018-04-15 ENCOUNTER — Other Ambulatory Visit (HOSPITAL_COMMUNITY)
Admission: RE | Admit: 2018-04-15 | Discharge: 2018-04-15 | Disposition: A | Discharge status: Home / Self Care | Payer: BLUE CROSS/BLUE SHIELD | Source: Ambulatory Visit | Attending: Infectious Diseases | Admitting: Infectious Diseases

## 2018-04-15 DIAGNOSIS — B20 Human immunodeficiency virus [HIV] disease: Secondary | ICD-10-CM

## 2018-04-15 DIAGNOSIS — Z79899 Other long term (current) drug therapy: Secondary | ICD-10-CM | POA: Diagnosis not present

## 2018-04-15 DIAGNOSIS — Z113 Encounter for screening for infections with a predominantly sexual mode of transmission: Secondary | ICD-10-CM

## 2018-04-16 DIAGNOSIS — F418 Other specified anxiety disorders: Secondary | ICD-10-CM | POA: Diagnosis not present

## 2018-04-16 DIAGNOSIS — Z0000 Encounter for general adult medical examination without abnormal findings: Secondary | ICD-10-CM | POA: Diagnosis not present

## 2018-04-16 DIAGNOSIS — E1142 Type 2 diabetes mellitus with diabetic polyneuropathy: Secondary | ICD-10-CM | POA: Diagnosis not present

## 2018-04-16 DIAGNOSIS — I10 Essential (primary) hypertension: Secondary | ICD-10-CM | POA: Diagnosis not present

## 2018-04-16 LAB — T-HELPER CELL (CD4) - (RCID CLINIC ONLY)
CD4 % Helper T Cell: 12 % — ABNORMAL LOW (ref 33–55)
CD4 T Cell Abs: 600 /uL (ref 400–2700)

## 2018-04-17 LAB — COMPREHENSIVE METABOLIC PANEL
AG Ratio: 1.5 (calc) (ref 1.0–2.5)
ALT: 22 U/L (ref 9–46)
AST: 18 U/L (ref 10–35)
Albumin: 4.6 g/dL (ref 3.6–5.1)
Alkaline phosphatase (APISO): 111 U/L (ref 35–144)
BUN/Creatinine Ratio: 19 (calc) (ref 6–22)
BUN: 25 mg/dL (ref 7–25)
CO2: 23 mmol/L (ref 20–32)
Calcium: 10 mg/dL (ref 8.6–10.3)
Chloride: 102 mmol/L (ref 98–110)
Creat: 1.34 mg/dL — ABNORMAL HIGH (ref 0.70–1.25)
Globulin: 3.1 g/dL (calc) (ref 1.9–3.7)
Glucose, Bld: 168 mg/dL — ABNORMAL HIGH (ref 65–99)
Potassium: 4.8 mmol/L (ref 3.5–5.3)
Sodium: 137 mmol/L (ref 135–146)
Total Bilirubin: 2 mg/dL — ABNORMAL HIGH (ref 0.2–1.2)
Total Protein: 7.7 g/dL (ref 6.1–8.1)

## 2018-04-17 LAB — LIPID PANEL
Cholesterol: 137 mg/dL (ref <200)
HDL: 43 mg/dL (ref >=40)
LDL Cholesterol (Calc): 65 mg/dL (calc)
Non-HDL Cholesterol (Calc): 94 mg/dL (calc) (ref <130)
Total CHOL/HDL Ratio: 3.2 (calc) (ref <5.0)
Triglycerides: 230 mg/dL — ABNORMAL HIGH (ref <150)

## 2018-04-17 LAB — CBC
HCT: 45.9 % (ref 38.5–50.0)
Hemoglobin: 16.7 g/dL (ref 13.2–17.1)
MCH: 35.4 pg — ABNORMAL HIGH (ref 27.0–33.0)
MCHC: 36.4 g/dL — ABNORMAL HIGH (ref 32.0–36.0)
MCV: 97.2 fL (ref 80.0–100.0)
MPV: 11.1 fL (ref 7.5–12.5)
Platelets: 229 10*3/uL (ref 140–400)
RBC: 4.72 10*6/uL (ref 4.20–5.80)
RDW: 12.3 % (ref 11.0–15.0)
WBC: 14.3 10*3/uL — ABNORMAL HIGH (ref 3.8–10.8)

## 2018-04-17 LAB — HIV-1 RNA QUANT-NO REFLEX-BLD
HIV 1 RNA Quant: <20 copies/mL
HIV-1 RNA Quant, Log: <1.3 Log copies/mL

## 2018-04-17 LAB — URINE CYTOLOGY ANCILLARY ONLY
Chlamydia: NEGATIVE
Neisseria Gonorrhea: NEGATIVE

## 2018-04-17 LAB — RPR: RPR Ser Ql: NONREACTIVE

## 2018-04-29 ENCOUNTER — Encounter: Payer: BLUE CROSS/BLUE SHIELD | Admitting: Infectious Diseases

## 2018-05-03 ENCOUNTER — Encounter: Payer: BLUE CROSS/BLUE SHIELD | Admitting: Infectious Diseases

## 2018-05-05 ENCOUNTER — Encounter: Payer: Self-pay | Admitting: Infectious Diseases

## 2018-05-05 ENCOUNTER — Ambulatory Visit: Payer: BLUE CROSS/BLUE SHIELD | Admitting: Infectious Diseases

## 2018-05-05 VITALS — BP 125/75 | HR 80 | Temp 98.1°F

## 2018-05-05 DIAGNOSIS — B20 Human immunodeficiency virus [HIV] disease: Secondary | ICD-10-CM | POA: Diagnosis not present

## 2018-05-05 DIAGNOSIS — E118 Type 2 diabetes mellitus with unspecified complications: Secondary | ICD-10-CM | POA: Diagnosis not present

## 2018-05-05 DIAGNOSIS — E291 Testicular hypofunction: Secondary | ICD-10-CM | POA: Diagnosis not present

## 2018-05-05 DIAGNOSIS — Z113 Encounter for screening for infections with a predominantly sexual mode of transmission: Secondary | ICD-10-CM | POA: Diagnosis not present

## 2018-05-05 DIAGNOSIS — Z79899 Other long term (current) drug therapy: Secondary | ICD-10-CM

## 2018-05-05 NOTE — Assessment & Plan Note (Signed)
Will see if we can get Dr Buddy Duty to see him with his LE edema with increased dose of testosterone.

## 2018-05-05 NOTE — Assessment & Plan Note (Signed)
Will have him f/u with endo, he is doing very well.

## 2018-05-05 NOTE — Progress Notes (Signed)
   Subjective:    Patient ID: Joshua Ehle., male    DOB: 02-20-1951, 68 y.o.   MRN: [id number]  HPI 68yo M with hx of hemochromatosis, DM2 (dx 2007), secondary hypogonadism (on clomid), CKD3,hyperlipidemia, lumbar laminectomy/decompression (04-2011), and HIV+ since 1990 when he was dx on insurance physical. He has been maintained on [place] -->changed to [facility] 2017.  He has been doing well with his DM- last A1C was 5.8.  No problems with odefsey, takes after at [facility].   HIV 1 RNA Quant (copies/mL)  Date Value  04/15/2018 <20 NOT DETECTED  04/01/2017 <20 NOT DETECTED  04/22/2016 <20 NOT DETECTED   CD4 T Cell Abs (/uL)  Date Value  04/15/2018 600  04/01/2017 690  04/22/2016 830    Review of Systems  Constitutional: Negative for appetite change, fatigue and unexpected weight change.  Respiratory: Negative for cough and shortness of breath.   Cardiovascular: Positive for leg swelling.  Gastrointestinal: Negative for constipation and diarrhea.  Genitourinary: Negative for difficulty urinating.  Psychiatric/Behavioral: Negative for dysphoric mood.   Wt has been up 7-8#. attributes to increase dose of testosterone 1 month ago. Last testo level was 118 and had dose doubled.  Is getting new PCP and endocrinologist Dr Buddy Duty).  Has ophtho appt sched in the next month Please see HPI. All other systems reviewed and negative.     Objective:     General- no distress, obese Eyes- EOMI, PERRL Mouth- no lesion Neck- nontender, no LAN Chest- CTA CV- RRR Abd- BS+ soft, nontender Extr- 3+ edema Psy- mood, affect normal      Assessment & Plan:

## 2018-05-05 NOTE — Assessment & Plan Note (Signed)
He is doing well His labs are explained to him Offered/refused condoms.  Will see him back in 1 year.  His vax are up to date.

## 2018-05-07 DIAGNOSIS — E291 Testicular hypofunction: Secondary | ICD-10-CM | POA: Diagnosis not present

## 2018-05-07 DIAGNOSIS — Z23 Encounter for immunization: Secondary | ICD-10-CM | POA: Diagnosis not present

## 2018-05-11 ENCOUNTER — Other Ambulatory Visit: Payer: Self-pay | Admitting: Infectious Diseases

## 2018-05-11 DIAGNOSIS — B20 Human immunodeficiency virus [HIV] disease: Secondary | ICD-10-CM

## 2018-05-28 DIAGNOSIS — E291 Testicular hypofunction: Secondary | ICD-10-CM | POA: Diagnosis not present

## 2018-07-23 DIAGNOSIS — E291 Testicular hypofunction: Secondary | ICD-10-CM | POA: Diagnosis not present

## 2018-08-27 DIAGNOSIS — E291 Testicular hypofunction: Secondary | ICD-10-CM | POA: Diagnosis not present

## 2018-08-31 DIAGNOSIS — M545 Low back pain: Secondary | ICD-10-CM | POA: Diagnosis not present

## 2018-08-31 DIAGNOSIS — E1140 Type 2 diabetes mellitus with diabetic neuropathy, unspecified: Secondary | ICD-10-CM | POA: Diagnosis not present

## 2018-08-31 DIAGNOSIS — E1142 Type 2 diabetes mellitus with diabetic polyneuropathy: Secondary | ICD-10-CM | POA: Insufficient documentation

## 2018-08-31 DIAGNOSIS — M5136 Other intervertebral disc degeneration, lumbar region: Secondary | ICD-10-CM | POA: Diagnosis not present

## 2018-08-31 DIAGNOSIS — E109 Type 1 diabetes mellitus without complications: Secondary | ICD-10-CM | POA: Diagnosis not present

## 2018-09-07 DIAGNOSIS — M545 Low back pain: Secondary | ICD-10-CM | POA: Diagnosis not present

## 2018-09-15 DIAGNOSIS — M545 Low back pain, unspecified: Secondary | ICD-10-CM | POA: Insufficient documentation

## 2018-09-17 DIAGNOSIS — E291 Testicular hypofunction: Secondary | ICD-10-CM | POA: Diagnosis not present

## 2018-09-17 DIAGNOSIS — M545 Low back pain: Secondary | ICD-10-CM | POA: Diagnosis not present

## 2018-10-08 DIAGNOSIS — E291 Testicular hypofunction: Secondary | ICD-10-CM | POA: Diagnosis not present

## 2018-10-08 DIAGNOSIS — Z125 Encounter for screening for malignant neoplasm of prostate: Secondary | ICD-10-CM | POA: Diagnosis not present

## 2018-10-08 DIAGNOSIS — I10 Essential (primary) hypertension: Secondary | ICD-10-CM | POA: Diagnosis not present

## 2018-10-08 DIAGNOSIS — E118 Type 2 diabetes mellitus with unspecified complications: Secondary | ICD-10-CM | POA: Diagnosis not present

## 2018-10-11 DIAGNOSIS — E291 Testicular hypofunction: Secondary | ICD-10-CM | POA: Diagnosis not present

## 2018-10-11 DIAGNOSIS — E119 Type 2 diabetes mellitus without complications: Secondary | ICD-10-CM | POA: Diagnosis not present

## 2018-10-11 DIAGNOSIS — Z5181 Encounter for therapeutic drug level monitoring: Secondary | ICD-10-CM | POA: Diagnosis not present

## 2018-10-11 DIAGNOSIS — E538 Deficiency of other specified B group vitamins: Secondary | ICD-10-CM | POA: Diagnosis not present

## 2018-10-11 DIAGNOSIS — Z79899 Other long term (current) drug therapy: Secondary | ICD-10-CM | POA: Diagnosis not present

## 2018-10-11 DIAGNOSIS — Z125 Encounter for screening for malignant neoplasm of prostate: Secondary | ICD-10-CM | POA: Diagnosis not present

## 2018-10-11 DIAGNOSIS — R5383 Other fatigue: Secondary | ICD-10-CM | POA: Diagnosis not present

## 2018-10-15 DIAGNOSIS — E785 Hyperlipidemia, unspecified: Secondary | ICD-10-CM | POA: Diagnosis not present

## 2018-10-15 DIAGNOSIS — I10 Essential (primary) hypertension: Secondary | ICD-10-CM | POA: Diagnosis not present

## 2018-10-15 DIAGNOSIS — B20 Human immunodeficiency virus [HIV] disease: Secondary | ICD-10-CM | POA: Diagnosis not present

## 2018-10-15 DIAGNOSIS — E118 Type 2 diabetes mellitus with unspecified complications: Secondary | ICD-10-CM | POA: Diagnosis not present

## 2018-10-29 DIAGNOSIS — E291 Testicular hypofunction: Secondary | ICD-10-CM | POA: Diagnosis not present

## 2018-11-01 DIAGNOSIS — R809 Proteinuria, unspecified: Secondary | ICD-10-CM | POA: Diagnosis not present

## 2018-11-01 DIAGNOSIS — N181 Chronic kidney disease, stage 1: Secondary | ICD-10-CM | POA: Diagnosis not present

## 2018-11-01 DIAGNOSIS — I129 Hypertensive chronic kidney disease with stage 1 through stage 4 chronic kidney disease, or unspecified chronic kidney disease: Secondary | ICD-10-CM | POA: Diagnosis not present

## 2018-11-01 DIAGNOSIS — E1129 Type 2 diabetes mellitus with other diabetic kidney complication: Secondary | ICD-10-CM | POA: Diagnosis not present

## 2018-11-02 DIAGNOSIS — M79644 Pain in right finger(s): Secondary | ICD-10-CM | POA: Insufficient documentation

## 2018-11-03 DIAGNOSIS — M79644 Pain in right finger(s): Secondary | ICD-10-CM | POA: Diagnosis not present

## 2018-11-03 DIAGNOSIS — M180 Bilateral primary osteoarthritis of first carpometacarpal joints: Secondary | ICD-10-CM | POA: Diagnosis not present

## 2018-11-03 DIAGNOSIS — M1812 Unilateral primary osteoarthritis of first carpometacarpal joint, left hand: Secondary | ICD-10-CM | POA: Diagnosis not present

## 2018-11-03 DIAGNOSIS — M79645 Pain in left finger(s): Secondary | ICD-10-CM | POA: Diagnosis not present

## 2018-11-05 ENCOUNTER — Other Ambulatory Visit: Payer: Self-pay | Admitting: Infectious Diseases

## 2018-11-05 DIAGNOSIS — B20 Human immunodeficiency virus [HIV] disease: Secondary | ICD-10-CM

## 2018-11-06 DIAGNOSIS — M5416 Radiculopathy, lumbar region: Secondary | ICD-10-CM | POA: Diagnosis not present

## 2018-11-19 DIAGNOSIS — Z23 Encounter for immunization: Secondary | ICD-10-CM | POA: Diagnosis not present

## 2018-11-19 DIAGNOSIS — E291 Testicular hypofunction: Secondary | ICD-10-CM | POA: Diagnosis not present

## 2018-12-10 DIAGNOSIS — E291 Testicular hypofunction: Secondary | ICD-10-CM | POA: Diagnosis not present

## 2018-12-31 DIAGNOSIS — E119 Type 2 diabetes mellitus without complications: Secondary | ICD-10-CM | POA: Diagnosis not present

## 2018-12-31 DIAGNOSIS — E291 Testicular hypofunction: Secondary | ICD-10-CM | POA: Diagnosis not present

## 2018-12-31 DIAGNOSIS — Z5181 Encounter for therapeutic drug level monitoring: Secondary | ICD-10-CM | POA: Diagnosis not present

## 2019-01-12 DIAGNOSIS — E291 Testicular hypofunction: Secondary | ICD-10-CM | POA: Diagnosis not present

## 2019-01-21 DIAGNOSIS — E291 Testicular hypofunction: Secondary | ICD-10-CM | POA: Diagnosis not present

## 2019-02-18 DIAGNOSIS — E119 Type 2 diabetes mellitus without complications: Secondary | ICD-10-CM | POA: Diagnosis not present

## 2019-02-24 DIAGNOSIS — H25041 Posterior subcapsular polar age-related cataract, right eye: Secondary | ICD-10-CM | POA: Diagnosis not present

## 2019-02-24 DIAGNOSIS — H2513 Age-related nuclear cataract, bilateral: Secondary | ICD-10-CM | POA: Diagnosis not present

## 2019-02-24 DIAGNOSIS — H5213 Myopia, bilateral: Secondary | ICD-10-CM | POA: Diagnosis not present

## 2019-03-22 DIAGNOSIS — H25811 Combined forms of age-related cataract, right eye: Secondary | ICD-10-CM | POA: Diagnosis not present

## 2019-03-22 DIAGNOSIS — H2511 Age-related nuclear cataract, right eye: Secondary | ICD-10-CM | POA: Diagnosis not present

## 2019-03-22 DIAGNOSIS — H21561 Pupillary abnormality, right eye: Secondary | ICD-10-CM | POA: Diagnosis not present

## 2019-03-22 DIAGNOSIS — H25041 Posterior subcapsular polar age-related cataract, right eye: Secondary | ICD-10-CM | POA: Diagnosis not present

## 2019-03-22 DIAGNOSIS — H59211 Accidental puncture and laceration of right eye and adnexa during an ophthalmic procedure: Secondary | ICD-10-CM | POA: Diagnosis not present

## 2019-04-05 DIAGNOSIS — H25812 Combined forms of age-related cataract, left eye: Secondary | ICD-10-CM | POA: Diagnosis not present

## 2019-04-05 DIAGNOSIS — H2512 Age-related nuclear cataract, left eye: Secondary | ICD-10-CM | POA: Diagnosis not present

## 2019-04-22 DIAGNOSIS — I10 Essential (primary) hypertension: Secondary | ICD-10-CM | POA: Diagnosis not present

## 2019-04-22 DIAGNOSIS — Z0000 Encounter for general adult medical examination without abnormal findings: Secondary | ICD-10-CM | POA: Diagnosis not present

## 2019-04-22 DIAGNOSIS — E118 Type 2 diabetes mellitus with unspecified complications: Secondary | ICD-10-CM | POA: Diagnosis not present

## 2019-04-22 DIAGNOSIS — Z125 Encounter for screening for malignant neoplasm of prostate: Secondary | ICD-10-CM | POA: Diagnosis not present

## 2019-04-22 DIAGNOSIS — E291 Testicular hypofunction: Secondary | ICD-10-CM | POA: Diagnosis not present

## 2019-04-29 DIAGNOSIS — Z0000 Encounter for general adult medical examination without abnormal findings: Secondary | ICD-10-CM | POA: Diagnosis not present

## 2019-05-04 ENCOUNTER — Other Ambulatory Visit: Payer: Self-pay

## 2019-05-04 DIAGNOSIS — B20 Human immunodeficiency virus [HIV] disease: Secondary | ICD-10-CM

## 2019-05-04 MED ORDER — ODEFSEY 200-25-25 MG PO TABS: 1.0000 | ORAL_TABLET | Freq: Every day | ORAL | 0 refills | Status: DC

## 2019-05-13 ENCOUNTER — Ambulatory Visit: Payer: BC Managed Care – PPO | Admitting: Podiatry

## 2019-05-13 DIAGNOSIS — E291 Testicular hypofunction: Secondary | ICD-10-CM | POA: Diagnosis not present

## 2019-05-16 DIAGNOSIS — M13849 Other specified arthritis, unspecified hand: Secondary | ICD-10-CM | POA: Diagnosis not present

## 2019-05-16 DIAGNOSIS — M180 Bilateral primary osteoarthritis of first carpometacarpal joints: Secondary | ICD-10-CM | POA: Diagnosis not present

## 2019-05-16 DIAGNOSIS — E1140 Type 2 diabetes mellitus with diabetic neuropathy, unspecified: Secondary | ICD-10-CM | POA: Diagnosis not present

## 2019-05-18 ENCOUNTER — Other Ambulatory Visit: Payer: Self-pay

## 2019-05-18 DIAGNOSIS — B20 Human immunodeficiency virus [HIV] disease: Secondary | ICD-10-CM

## 2019-05-18 MED ORDER — ODEFSEY 200-25-25 MG PO TABS: 1.0000 | ORAL_TABLET | Freq: Every day | ORAL | 0 refills | Status: DC

## 2019-05-19 ENCOUNTER — Other Ambulatory Visit: Payer: Self-pay

## 2019-05-19 ENCOUNTER — Other Ambulatory Visit (HOSPITAL_COMMUNITY)
Admission: RE | Admit: 2019-05-19 | Discharge: 2019-05-19 | Disposition: A | Discharge status: Home / Self Care | Payer: MEDICAID | Source: Ambulatory Visit | Attending: Infectious Diseases | Admitting: Infectious Diseases

## 2019-05-19 ENCOUNTER — Other Ambulatory Visit: Payer: BC Managed Care – PPO

## 2019-05-19 DIAGNOSIS — B20 Human immunodeficiency virus [HIV] disease: Secondary | ICD-10-CM | POA: Diagnosis not present

## 2019-05-19 DIAGNOSIS — Z113 Encounter for screening for infections with a predominantly sexual mode of transmission: Secondary | ICD-10-CM | POA: Insufficient documentation

## 2019-05-19 DIAGNOSIS — Z79899 Other long term (current) drug therapy: Secondary | ICD-10-CM | POA: Diagnosis not present

## 2019-05-20 LAB — URINE CYTOLOGY ANCILLARY ONLY
Chlamydia: NEGATIVE
Comment: NEGATIVE
Comment: NORMAL
Neisseria Gonorrhea: NEGATIVE

## 2019-05-20 LAB — T-HELPER CELL (CD4) - (RCID CLINIC ONLY)
CD4 % Helper T Cell: 17 % — ABNORMAL LOW (ref 33–65)
CD4 T Cell Abs: 858 /uL (ref 400–1790)

## 2019-05-21 LAB — LIPID PANEL
Cholesterol: 65 mg/dL (ref <200)
HDL: 35 mg/dL — ABNORMAL LOW (ref >=40)
LDL Cholesterol (Calc): <10 mg/dL (calc)
Non-HDL Cholesterol (Calc): 30 mg/dL (calc) (ref <130)
Total CHOL/HDL Ratio: 1.9 (calc) (ref <5.0)
Triglycerides: 225 mg/dL — ABNORMAL HIGH (ref <150)

## 2019-05-21 LAB — CBC WITH DIFFERENTIAL/PLATELET
Absolute Monocytes: 824 cells/uL (ref 200–950)
Basophils Absolute: 35 cells/uL (ref 0–200)
Basophils Relative: 0.3 %
Eosinophils Absolute: 81 cells/uL (ref 15–500)
Eosinophils Relative: 0.7 %
HCT: 45.6 % (ref 38.5–50.0)
Hemoglobin: 16.2 g/dL (ref 13.2–17.1)
Lymphs Abs: 5011 cells/uL — ABNORMAL HIGH (ref 850–3900)
MCH: 35.9 pg — ABNORMAL HIGH (ref 27.0–33.0)
MCHC: 35.5 g/dL (ref 32.0–36.0)
MCV: 101.1 fL — ABNORMAL HIGH (ref 80.0–100.0)
MPV: 11.1 fL (ref 7.5–12.5)
Monocytes Relative: 7.1 %
Neutro Abs: 5649 cells/uL (ref 1500–7800)
Neutrophils Relative %: 48.7 %
Platelets: 235 10*3/uL (ref 140–400)
RBC: 4.51 10*6/uL (ref 4.20–5.80)
RDW: 13.2 % (ref 11.0–15.0)
Total Lymphocyte: 43.2 %
WBC: 11.6 10*3/uL — ABNORMAL HIGH (ref 3.8–10.8)

## 2019-05-21 LAB — COMPREHENSIVE METABOLIC PANEL
AG Ratio: 1.6 (calc) (ref 1.0–2.5)
ALT: 16 U/L (ref 9–46)
AST: 17 U/L (ref 10–35)
Albumin: 4.4 g/dL (ref 3.6–5.1)
Alkaline phosphatase (APISO): 86 U/L (ref 35–144)
BUN/Creatinine Ratio: 20 (calc) (ref 6–22)
BUN: 26 mg/dL — ABNORMAL HIGH (ref 7–25)
CO2: 26 mmol/L (ref 20–32)
Calcium: 9.2 mg/dL (ref 8.6–10.3)
Chloride: 103 mmol/L (ref 98–110)
Creat: 1.3 mg/dL — ABNORMAL HIGH (ref 0.70–1.25)
Globulin: 2.7 g/dL (calc) (ref 1.9–3.7)
Glucose, Bld: 123 mg/dL — ABNORMAL HIGH (ref 65–99)
Potassium: 4.1 mmol/L (ref 3.5–5.3)
Sodium: 139 mmol/L (ref 135–146)
Total Bilirubin: 2.4 mg/dL — ABNORMAL HIGH (ref 0.2–1.2)
Total Protein: 7.1 g/dL (ref 6.1–8.1)

## 2019-05-21 LAB — HIV-1 RNA QUANT-NO REFLEX-BLD
HIV 1 RNA Quant: <20 copies/mL
HIV-1 RNA Quant, Log: <1.3 Log copies/mL

## 2019-05-21 LAB — RPR: RPR Ser Ql: NONREACTIVE

## 2019-05-27 ENCOUNTER — Ambulatory Visit: Payer: BC Managed Care – PPO | Admitting: Podiatry

## 2019-06-03 ENCOUNTER — Encounter: Payer: Self-pay | Admitting: Infectious Diseases

## 2019-06-03 ENCOUNTER — Ambulatory Visit (INDEPENDENT_AMBULATORY_CARE_PROVIDER_SITE_OTHER): Payer: BC Managed Care – PPO | Admitting: Infectious Diseases

## 2019-06-03 ENCOUNTER — Other Ambulatory Visit: Payer: Self-pay

## 2019-06-03 VITALS — BP 132/81 | Temp 98.1°F | Wt 234.0 lb

## 2019-06-03 DIAGNOSIS — E7849 Other hyperlipidemia: Secondary | ICD-10-CM | POA: Diagnosis not present

## 2019-06-03 DIAGNOSIS — B20 Human immunodeficiency virus [HIV] disease: Secondary | ICD-10-CM | POA: Diagnosis not present

## 2019-06-03 DIAGNOSIS — Z113 Encounter for screening for infections with a predominantly sexual mode of transmission: Secondary | ICD-10-CM

## 2019-06-03 DIAGNOSIS — G4700 Insomnia, unspecified: Secondary | ICD-10-CM | POA: Diagnosis not present

## 2019-06-03 DIAGNOSIS — Z79899 Other long term (current) drug therapy: Secondary | ICD-10-CM

## 2019-06-03 DIAGNOSIS — E118 Type 2 diabetes mellitus with unspecified complications: Secondary | ICD-10-CM

## 2019-06-03 DIAGNOSIS — E291 Testicular hypofunction: Secondary | ICD-10-CM | POA: Diagnosis not present

## 2019-06-03 MED ORDER — ODEFSEY 200-25-25 MG PO TABS: 1.0000 | ORAL_TABLET | Freq: Every day | ORAL | 4 refills | Status: DC

## 2019-06-03 NOTE — Assessment & Plan Note (Signed)
Denies at this time.

## 2019-06-03 NOTE — Assessment & Plan Note (Addendum)
He is doing very well Continue odefsy Could consider injectable, discuss at next visit. Offered/refused condoms.  Has gotten covid vax Needs PCV 23 2024 rtc in 1 year with labs prior.

## 2019-06-03 NOTE — Assessment & Plan Note (Signed)
He is doing very well.  Appreciate his PCP f/u He has podiatry f/u as well as optho f/u.

## 2019-06-03 NOTE — Progress Notes (Signed)
   Subjective:    Patient ID: Joshua Buetow., male    DOB: [birth year], 69 y.o.   MRN: [id number]  HPI 69yo M with hx of hemochromatosis, DM2 (dx 2007), secondary hypogonadism (on clomid), CKD3,hyperlipidemia, lumbar laminectomy/decompression (04-2011), and HIV+ since 1990 when he was dx on insurance physical. He has been maintained on [place] -->changed to [facility] [place] 2017.  He is completely COVID vaccinated. He had cataract surgery in Jan and he is glasses free for first time in 50 yrs.  He has been doing well with his DM- last A1C was 6.Needs refill. 2 after stopping metformin (now resumed).  No problems with odefsey, takes after at [facility].  Sees podiatry (06-09-19) No foot wounds.   HIV 1 RNA Quant (copies/mL)  Date Value  05/19/2019 <20 NOT DETECTED  04/15/2018 <20 NOT DETECTED  04/01/2017 <20 NOT DETECTED   CD4 T Cell Abs (/uL)  Date Value  05/19/2019 858  04/15/2018 600  04/01/2017 690    Review of Systems  Constitutional: Negative for appetite change and unexpected weight change.  Respiratory: Negative for cough and shortness of breath.   Cardiovascular: Positive for leg swelling.  Gastrointestinal: Negative for constipation and diarrhea.  Genitourinary: Negative for difficulty urinating.  Neurological: Positive for numbness.  Psychiatric/Behavioral: Negative for sleep disturbance.       Objective:   Physical Exam Constitutional:      Appearance: Normal appearance. He is obese.  HENT:     Mouth/Throat:     Mouth: Mucous membranes are moist.     Pharynx: Oropharynx is clear. No oropharyngeal exudate.  Eyes:     Extraocular Movements: Extraocular movements intact.     Pupils: Pupils are equal, round, and reactive to light.  Cardiovascular:     Rate and Rhythm: Normal rate and regular rhythm.  Pulmonary:     Effort: Pulmonary effort is normal.     Breath sounds: Normal breath sounds.  Abdominal:     General: Bowel sounds are normal. There is no distension.   Palpations: Abdomen is soft.     Tenderness: There is no abdominal tenderness.  Musculoskeletal:     Cervical back: Normal range of motion and neck supple.     Right lower leg: Edema present.     Left lower leg: Edema present.  Neurological:     Mental Status: He is alert.  Psychiatric:        Mood and Affect: Mood normal.           Assessment & Plan:

## 2019-06-03 NOTE — Assessment & Plan Note (Signed)
His lipds are quite good excluding his trig.  Appreciate his PCP f/u.

## 2019-06-08 ENCOUNTER — Telehealth: Payer: Self-pay | Admitting: *Deleted

## 2019-06-08 DIAGNOSIS — B20 Human immunodeficiency virus [HIV] disease: Secondary | ICD-10-CM

## 2019-06-08 NOTE — Telephone Encounter (Signed)
Per insurance, patient needs to fill [facility] through a specialty pharmacy. RN called patient for update. He states he received a letter that requires him to start using [facility] effective 4/1. Patient just received a refill yesterday. RN will hold refill until 4/1 at his request. Landis Gandy, RN

## 2019-06-09 DIAGNOSIS — D1801 Hemangioma of skin and subcutaneous tissue: Secondary | ICD-10-CM | POA: Diagnosis not present

## 2019-06-09 DIAGNOSIS — I781 Nevus, non-neoplastic: Secondary | ICD-10-CM | POA: Diagnosis not present

## 2019-06-09 DIAGNOSIS — C44519 Basal cell carcinoma of skin of other part of trunk: Secondary | ICD-10-CM | POA: Diagnosis not present

## 2019-06-09 DIAGNOSIS — L821 Other seborrheic keratosis: Secondary | ICD-10-CM | POA: Diagnosis not present

## 2019-06-09 DIAGNOSIS — L218 Other seborrheic dermatitis: Secondary | ICD-10-CM | POA: Diagnosis not present

## 2019-06-09 MED ORDER — ODEFSEY 200-25-25 MG PO TABS: 1.0000 | ORAL_TABLET | Freq: Every day | ORAL | 4 refills | Status: DC

## 2019-06-09 NOTE — Addendum Note (Signed)
Addended by: Landis Gandy on: 06/09/2019 10:04 AM   Modules accepted: Orders

## 2019-06-24 DIAGNOSIS — E291 Testicular hypofunction: Secondary | ICD-10-CM | POA: Diagnosis not present

## 2019-07-08 DIAGNOSIS — E291 Testicular hypofunction: Secondary | ICD-10-CM | POA: Diagnosis not present

## 2019-07-08 DIAGNOSIS — Z5181 Encounter for therapeutic drug level monitoring: Secondary | ICD-10-CM | POA: Diagnosis not present

## 2019-07-08 DIAGNOSIS — E119 Type 2 diabetes mellitus without complications: Secondary | ICD-10-CM | POA: Diagnosis not present

## 2019-07-29 DIAGNOSIS — E291 Testicular hypofunction: Secondary | ICD-10-CM | POA: Diagnosis not present

## 2019-08-12 DIAGNOSIS — E291 Testicular hypofunction: Secondary | ICD-10-CM | POA: Diagnosis not present

## 2019-08-26 DIAGNOSIS — E291 Testicular hypofunction: Secondary | ICD-10-CM | POA: Diagnosis not present

## 2019-09-02 DIAGNOSIS — J069 Acute upper respiratory infection, unspecified: Secondary | ICD-10-CM | POA: Diagnosis not present

## 2019-09-09 ENCOUNTER — Encounter: Payer: Self-pay | Admitting: Podiatry

## 2019-09-09 ENCOUNTER — Ambulatory Visit (INDEPENDENT_AMBULATORY_CARE_PROVIDER_SITE_OTHER): Payer: BC Managed Care – PPO

## 2019-09-09 ENCOUNTER — Ambulatory Visit: Payer: MEDICAID | Admitting: Podiatry

## 2019-09-09 ENCOUNTER — Other Ambulatory Visit: Payer: Self-pay

## 2019-09-09 VITALS — Temp 97.8°F

## 2019-09-09 DIAGNOSIS — M722 Plantar fascial fibromatosis: Secondary | ICD-10-CM

## 2019-09-09 DIAGNOSIS — M19071 Primary osteoarthritis, right ankle and foot: Secondary | ICD-10-CM | POA: Diagnosis not present

## 2019-09-09 DIAGNOSIS — E1149 Type 2 diabetes mellitus with other diabetic neurological complication: Secondary | ICD-10-CM

## 2019-09-09 DIAGNOSIS — G8929 Other chronic pain: Secondary | ICD-10-CM

## 2019-09-09 DIAGNOSIS — M7731 Calcaneal spur, right foot: Secondary | ICD-10-CM | POA: Diagnosis not present

## 2019-09-09 NOTE — Patient Instructions (Signed)

## 2019-09-09 NOTE — Progress Notes (Signed)
Subjective:   Patient ID: [name]., male   DOB: 69 y.o.   MRN: [id number]   HPI 70 year old male presents the office today for concerns of pain to the bottom of his right heel which has been on the last 4 days.  He states that it hurts throughout the day.  Denies any recent injury or trauma.  He has tried changing shoes which has not helped as well as icing.  He has not changed his activity level prior to the onset of pain or any other changes that he can recall.  He is diabetic and last A1c was 6.1.  He last was better this morning which is 109.  Denies any open sores and denies any claudication symptoms.   Review of Systems  All other systems reviewed and are negative.  Past Medical History:  Diagnosis Date  . Complication of anesthesia    ANESTHESIA AWARENESS  DURING ADRENALECTOMY AND COLONOSCOPY  . Diabetes mellitus    on oral meds-no insulin  . DIABETES MELLITUS, TYPE II, UNCONTROLLED 06/22/2006  . GILBERT'S SYNDROME 02/25/2007  . GOUT 06/22/2006  . HEMOCHROMATOSIS, HX OF 12/15/2005   phlebotomy every 6 to 8 weeks--at [facility]  . HIV disease ([place]) 1990   [facility] [place]  . HNP (herniated nucleus pulposus)    LUMBAR PAIN WITH PAIN DOWN LEFT LEG TO TOES-SOME NUMBNESS AND TINGLING IN BIG TOE  . Hypertension     Past Surgical History:  Procedure Laterality Date  . ADRENALECTOMY     RIGHT ADRENALECTOMY   . CHOLECYSTECTOMY    . LUMBAR LAMINECTOMY/DECOMPRESSION MICRODISCECTOMY  04/23/2011   Procedure: LUMBAR LAMINECTOMY/DECOMPRESSION MICRODISCECTOMY;  Surgeon: Johnn Hai, MD;  Location: [facility];  Service: Orthopedics;  Laterality: N/A;  Decompression L5 S1 (X-Ray)  . ORIF LEFT SHOULDER     STILL HAS HARDWARE IN THE SHOULDER     Current Outpatient Medications:  .  allopurinol (ZYLOPRIM) 300 MG tablet, Take 300 mg by mouth daily. , Disp: , Rfl:  .  amLODipine (NORVASC) 10 MG tablet, Take 10 mg by mouth daily., Disp:  , Rfl:  .  carvedilol (COREG) 25 MG tablet, Take 25 mg by mouth daily. , Disp: , Rfl:  .  colchicine 0.6 MG tablet, Take 0.6 mg by mouth as needed (gout). , Disp: , Rfl:  .  emtricitabine-rilpivir-tenofovir AF (ODEFSEY) 200-25-25 MG TABS tablet, Take 1 tablet by mouth daily with breakfast., Disp: 90 tablet, Rfl: 4 .  furosemide (LASIX) 40 MG tablet, Take 40 mg by mouth daily., Disp: , Rfl:  .  gabapentin (NEURONTIN) 300 MG capsule, TAKE ONE CAPSULE BY MOUTH TWICE DAILY (Patient taking differently: Take 300 mg by mouth 3 (three) times daily. ), Disp: 30 capsule, Rfl: 1 .  glucose blood (ONETOUCH VERIO) test strip, 1 each by Other route daily. And lancets 1/day, Disp: 100 each, Rfl: 12 .  Icosapent Ethyl (VASCEPA PO), Take 1 tablet by mouth 2 (two) times daily. , Disp: , Rfl:  .  JARDIANCE 10 MG TABS tablet, TK 1 T PO QD, Disp: , Rfl: 6 .  SOLIQUA 100-33 UNT-MCG/ML SOPN, Injects 60 units daily, Disp: , Rfl: 12 .  telmisartan (MICARDIS) 80 MG tablet, Take 80 mg by mouth daily before breakfast. , Disp: , Rfl:  .  testosterone cypionate (DEPOTESTOSTERONE CYPIONATE) 200 MG/ML injection, Inject 200 mg into the muscle every 14 (fourteen) days., Disp: , Rfl:  .  vitamin B-12 (CYANOCOBALAMIN) 1000 MCG  tablet, Take 1,000 mcg by mouth daily., Disp: , Rfl:   Allergies  Allergen Reactions  . Codeine Itching  . Meperidine Hcl Itching  . Scopolamine     GIVEN WITH A SURGERY YRS AGO--CAUSED HALLUNCINATIONS       Objective:  Physical Exam  General: AAO x3, NAD  Dermatological: Skin is warm, dry and supple bilateral. Nails x 10 are well manicured; remaining integument appears unremarkable at this time. There are no open sores, no preulcerative lesions, no rash or signs of infection present.  Vascular: Dorsalis Pedis artery and Posterior Tibial artery pedal pulses are 2/4 bilateral with immedate capillary fill time. Pedal hair growth present. No varicosities and no lower extremity edema present bilateral.  There is no pain with calf compression, swelling, warmth, erythema.   Neruologic: Sensation mildly decreased with Semmes Weinstein monofilament.  Negative Tinel sign.  Musculoskeletal:Tenderness to palpation along the plantar medial tubercle of the calcaneus at the insertion of plantar fascia on the right foot. There is no pain along the course of the plantar fascia within the arch of the foot. Plantar fascia appears to be intact. There is no pain with lateral compression of the calcaneus or pain with vibratory sensation. There is no pain along the course or insertion of the achilles tendon. No other areas of tenderness to bilateral lower extremities. Muscular strength 5/5 in all groups tested bilateral.  Gait: Unassisted, Nonantalgic.       Assessment:   Right heel pain, plantar fasciitis     Plan:  -Treatment options discussed including all alternatives, risks, and complications -Etiology of symptoms were discussed -X-rays obtained reviewed and I independently reviewed them myself with the patient.  Heel spurring present with arthritic changes present.  No evidence of acute fracture. -Steroid injection performed.  See procedure note below.  Continue stretching, icing exercises daily with as well as wearing supportive shoes. -Continue gabapentin for neuropathy  Procedure: Injection Tendon/Ligament Discussed alternatives, risks, complications and verbal consent was obtained.  Location: Right plantar fascia at the glabrous junction; medial approach. Skin Prep: Alcohol. Injectate: 0.5cc 0.5% marcaine plain, 0.5 cc 2% lidocaine plain and, 1 cc kenalog 10. Disposition: Patient tolerated procedure well. Injection site dressed with a band-aid.  Post-injection care was discussed and return precautions discussed.   Return in about 4 weeks (around 10/07/2019).  Trula Slade DPM

## 2019-09-16 DIAGNOSIS — E291 Testicular hypofunction: Secondary | ICD-10-CM | POA: Diagnosis not present

## 2019-09-30 DIAGNOSIS — E291 Testicular hypofunction: Secondary | ICD-10-CM | POA: Diagnosis not present

## 2019-10-07 ENCOUNTER — Ambulatory Visit: Payer: MEDICAID | Admitting: Podiatry

## 2019-10-14 DIAGNOSIS — E119 Type 2 diabetes mellitus without complications: Secondary | ICD-10-CM | POA: Diagnosis not present

## 2019-10-14 DIAGNOSIS — E291 Testicular hypofunction: Secondary | ICD-10-CM | POA: Diagnosis not present

## 2019-10-14 DIAGNOSIS — E118 Type 2 diabetes mellitus with unspecified complications: Secondary | ICD-10-CM | POA: Diagnosis not present

## 2019-10-14 DIAGNOSIS — E785 Hyperlipidemia, unspecified: Secondary | ICD-10-CM | POA: Diagnosis not present

## 2019-10-24 DIAGNOSIS — Z113 Encounter for screening for infections with a predominantly sexual mode of transmission: Secondary | ICD-10-CM

## 2019-10-24 DIAGNOSIS — B20 Human immunodeficiency virus [HIV] disease: Secondary | ICD-10-CM

## 2019-10-24 NOTE — Telephone Encounter (Signed)
Yes, he would fall under the "immunocompromised" proviso.  Would give him booster, he can get at [facility] sites or his local pharmacy as he wishes Thanks

## 2019-10-27 NOTE — Telephone Encounter (Signed)
I'm not sure he fits now that it has been clarified to uncontrolled HIV/AIDS

## 2019-11-01 DIAGNOSIS — I10 Essential (primary) hypertension: Secondary | ICD-10-CM | POA: Diagnosis not present

## 2019-11-01 DIAGNOSIS — E291 Testicular hypofunction: Secondary | ICD-10-CM | POA: Diagnosis not present

## 2019-11-01 DIAGNOSIS — E119 Type 2 diabetes mellitus without complications: Secondary | ICD-10-CM | POA: Diagnosis not present

## 2019-11-01 DIAGNOSIS — I499 Cardiac arrhythmia, unspecified: Secondary | ICD-10-CM | POA: Diagnosis not present

## 2019-11-01 DIAGNOSIS — R5383 Other fatigue: Secondary | ICD-10-CM | POA: Diagnosis not present

## 2019-11-01 DIAGNOSIS — Z794 Long term (current) use of insulin: Secondary | ICD-10-CM | POA: Diagnosis not present

## 2019-11-01 NOTE — Telephone Encounter (Signed)
Spoke with [name], reviewed current CDC guidelines. He will be due for his booster in November.  Scheduled patient for annual follow up at [facility], lab orders placed. He needs afternoon labs, will not be fasting. He has his cholesterol managed by PCP. He will bring copies of his lipids to his visit. Landis Gandy, RN

## 2019-11-01 NOTE — Addendum Note (Signed)
Addended by: Landis Gandy on: 11/01/2019 12:22 PM   Modules accepted: Orders

## 2019-11-04 ENCOUNTER — Ambulatory Visit: Payer: BC Managed Care – PPO | Admitting: Cardiology

## 2019-11-04 ENCOUNTER — Encounter: Payer: Self-pay | Admitting: Cardiology

## 2019-11-04 ENCOUNTER — Other Ambulatory Visit: Payer: Self-pay

## 2019-11-04 VITALS — BP 146/74 | HR 77 | Resp 17 | Ht 69.0 in | Wt 231.0 lb

## 2019-11-04 DIAGNOSIS — R9431 Abnormal electrocardiogram [ECG] [EKG]: Secondary | ICD-10-CM | POA: Diagnosis not present

## 2019-11-04 DIAGNOSIS — E782 Mixed hyperlipidemia: Secondary | ICD-10-CM | POA: Diagnosis not present

## 2019-11-04 DIAGNOSIS — I440 Atrioventricular block, first degree: Secondary | ICD-10-CM | POA: Diagnosis not present

## 2019-11-04 DIAGNOSIS — R609 Edema, unspecified: Secondary | ICD-10-CM | POA: Diagnosis not present

## 2019-11-04 NOTE — Progress Notes (Signed)
Patient referred by Joshua Gravel, MD for irregular heart rhythm   Subjective:   [name]., male    DOB: 1950-06-02, 69 y.o.   MRN: [id number]   Chief Complaint  Patient presents with  . Hypertension  . Abnormal ECG  . heart block  . New Patient (Initial Visit)     HPI  69 y/o Caucasian male with hypertension, hyperlipidemia, IDDM, HIV, hemochromatosis, ED s/p radical prostatectomy, referred for evaluation of irregular heart rhythm.  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  He lives on the second floor and takes 20 steps routinely without any chest pain, shortness of breath presyncope or syncope.  He has mild leg swelling, controlled with Lasix.  He sees a nephrologist for chronic kidney disease with recent creatinine increased from 1.3-1.8.  He is following with his nephrologist in September.  Patient was recently seen by his PCP for EKG and showed abnormalities as detailed below.  He is currently on carvedilol 25 mg which she takes once a day.  He has been on this dose and frequency for several years.  Patient previously had an echocardiogram in 2017 during his admission with sepsis.  I personally and independently reviewed prior EKGs from 2017 and 2013.  Regarding his hemochromatosis, he is followed closely by hematologist.  While he has had phlebotomies in the past-one of them leading to vasovagal syncope several years ago-he has not required any phlebotomy recently.   Past Medical History:  Diagnosis Date  . Complication of anesthesia    ANESTHESIA AWARENESS  DURING ADRENALECTOMY AND COLONOSCOPY  . Diabetes mellitus    on oral meds-no insulin  . DIABETES MELLITUS, TYPE II, UNCONTROLLED 06/22/2006  . GILBERT'S SYNDROME 02/25/2007  . GOUT 06/22/2006  . HEMOCHROMATOSIS, HX OF 12/15/2005   phlebotomy every 6 to 8 weeks--at [facility]  . HIV disease ([place])  1990   [facility]  . HNP (herniated nucleus pulposus)    LUMBAR PAIN WITH PAIN DOWN LEFT LEG TO TOES-SOME NUMBNESS AND TINGLING IN BIG TOE  . Hypertension      Past Surgical History:  Procedure Laterality Date  . ADRENALECTOMY     RIGHT ADRENALECTOMY   . CHOLECYSTECTOMY    . LUMBAR LAMINECTOMY/DECOMPRESSION MICRODISCECTOMY  04/23/2011   Procedure: LUMBAR LAMINECTOMY/DECOMPRESSION MICRODISCECTOMY;  Surgeon: Johnn Hai, MD;  Location: [facility];  Service: Orthopedics;  Laterality: N/A;  Decompression L5 S1 (X-Ray)  . ORIF LEFT SHOULDER     STILL HAS HARDWARE IN THE SHOULDER     Social History   Tobacco Use  Smoking Status Never Smoker  Smokeless Tobacco Never Used    Social History   Substance and Sexual Activity  Alcohol Use No  . Alcohol/week: 0.0 standard drinks     Family History  Problem Relation Age of Onset  . Hypertension Mother   . Hyperlipidemia Mother   . Atrial fibrillation Mother   . Hyperlipidemia Father   . Hypertension Father   . Atrial fibrillation Father      Current Outpatient Medications on File Prior to Visit  Medication Sig Dispense Refill  . allopurinol (ZYLOPRIM) 300 MG tablet Take 300 mg by mouth daily.     Marland Kitchen amLODipine (NORVASC) 10 MG tablet Take 10 mg by mouth daily.    Marland Kitchen atorvastatin (LIPITOR) 10 MG tablet Take 10 mg by  mouth at bedtime.    . carvedilol (COREG) 25 MG tablet Take 25 mg by mouth daily.     . colchicine 0.6 MG tablet Take 0.6 mg by mouth as needed (gout).     Marland Kitchen emtricitabine-rilpivir-tenofovir AF (ODEFSEY) 200-25-25 MG TABS tablet Take 1 tablet by mouth daily with breakfast. 90 tablet 4  . furosemide (LASIX) 40 MG tablet Take 40 mg by mouth daily.    Marland Kitchen gabapentin (NEURONTIN) 300 MG capsule TAKE ONE CAPSULE BY MOUTH TWICE DAILY (Patient taking differently: Take 300 mg by mouth 3 (three) times daily. ) 30 capsule 1  . glucose blood (ONETOUCH VERIO) test strip 1 each by  Other route daily. And lancets 1/day 100 each 12  . Icosapent Ethyl (VASCEPA PO) Take 1 tablet by mouth 2 (two) times daily.     Marland Kitchen JARDIANCE 10 MG TABS tablet TK 1 T PO QD  6  . SOLIQUA 100-33 UNT-MCG/ML SOPN Injects 60 units daily  12  . tadalafil (CIALIS) 20 MG tablet Take 20 mg by mouth daily as needed for erectile dysfunction.    Marland Kitchen telmisartan (MICARDIS) 80 MG tablet Take 80 mg by mouth daily before breakfast.     . testosterone cypionate (DEPOTESTOSTERONE CYPIONATE) 200 MG/ML injection Inject 200 mg into the muscle every 14 (fourteen) days.    . vitamin B-12 (CYANOCOBALAMIN) 1000 MCG tablet Take 1,000 mcg by mouth daily.     No current facility-administered medications on file prior to visit.    Cardiovascular and other pertinent studies:  EKG 11/04/2019: Sinus rhythm 76 bpm FIrst degree AV block, PR interval 300 msec Left atrial enlargement Poor R wave progression  EKG 11/01/2019: Sinus rhythm Mobitz type 1 AV block  Echocardiogram 2017: - Left ventricle: The cavity size was normal. Wall thickness was  normal. Systolic function was normal. The estimated ejection  fraction was in the range of 60% to 65%. Wall motion was normal;  there were no regional wall motion abnormalities. Left  ventricular diastolic function parameters were normal.  - Aortic valve: Valve area (VTI): 2.42 cm^2. Valve area (Vmax):  2.57 cm^2. Valve area (Vmean): 2.49 cm^2.  - Atrial septum: No defect or patent foramen ovale was identified.  - Pulmonary arteries: Systolic pressure was mildly increased. PA  peak pressure: 36 mm Hg (S).  - Technically adequate study.   CTA chest 2010: No evidence of pulmonary embolus.  Minimal patchy left upper lobe ground-glass densities. This could  represent areas of atelectasis or alveolitis.  Heart borderline in size.   Coronary angiogram 2010:  1. No obstructive coronary artery disease.  2. Abnormal filling pattern in both the right and left  coronary      distribution.  3. Normal left ventricular systolic function.   RECOMMENDATIONS:  I cannot explain the patient's severe shortness of  breath based on the findings of his coronary angiography or left  ventricular angiogram.  There is concern for pulmonary embolus.  The  patient will be taken for a stat CT angiogram to rule out pulmonary  embolus.  We will also get an echocardiogram after his CT scan.  We will  start a heparin drip here in the cath lab.  Of note, his right femoral  artery was sealed with an Angio-Seal femoral artery closure device.  Basic laboratory values have been sent including a metabolic profile,  CBC, D-dimer, BNP, and coagulation study.  Further plans to follow.    Recent labs: 10/14/2019: Glucose 108. BUN/Cr ?/1.8. eGFR 40. Na/K  143/4.1  H/H 16/48. MCV 101. Platelets 184. Chol 60, TG 247, HDL 29, LDL ? HbA1C 5.5% TSH 2.8   Review of Systems  Cardiovascular: Positive for leg swelling. Negative for chest pain, dyspnea on exertion, palpitations and syncope.         Vitals:   11/04/19 0919  BP: (!) 146/74  Pulse: 77  Resp: 17  SpO2: 98%     Body mass index is 34.11 kg/m. Filed Weights   11/04/19 0919  Weight: 231 lb (104.8 kg)     Objective:   Physical Exam Vitals and nursing note reviewed.  Constitutional:      General: He is not in acute distress. Neck:     Vascular: No JVD.  Cardiovascular:     Rate and Rhythm: Normal rate and regular rhythm.     Heart sounds: Normal heart sounds. No murmur heard.   Pulmonary:     Effort: Pulmonary effort is normal.     Breath sounds: Normal breath sounds. No wheezing or rales.  Musculoskeletal:     Right lower leg: Edema (1+) present.     Left lower leg: Edema (1+) present.          Assessment & Recommendations:   69 y/o Caucasian male with hypertension, hyperlipidemia, IDDM, HIV, hemochromatosis, ED s/p radical prostatectomy, referred for evaluation of irregular heart  rhythm.  Irregular heart rhythm, abnormal EKG: Independently reviewed EKG from 11/01/2019, and compared with EKG today.  Also reviewed EKG from 2017 in 2013. EKG from 11/01/2019 shows sinus rhythm with Mobitz 1 AV block.  EKG from today shows sinus rhythm with first-degree AV block with long PR interval of 300 ms. No atrial fibrillation seen. Patient is completely asymptomatic his EKG changes.  He is on moderate dose of carvedilol 25 mg, which he takes once daily.  I suspect carvedilol is contributing to his AV conduction.  Also has hemochromatosis.  It is important to screen him for any cardiac involvement of hemochromatosis. I will obtain an echocardiogram. I have asked him to reduce carvedilol to 25 mg from 1 tablet daily to half tablet daily.  We will continue to monitor his blood pressure closely.  If blood pressure stays fairly well controlled ie SBP <140 mmHg, will further reduce carvedilol to half tablet every other day. I will see him back in 1 month and obtain a repeat EKG. He has appointment with his nephrologist in September, where further hypertension management can be discussed as necessary.  Dyslipidemia: Elevated triglycerides.  He is currently on Vascepa, and now also on atorvastatin.  Continue follow-up with PCP regarding the same.        Thank you for referring the patient to [place]. Please feel free to contact with any questions.   Nigel Mormon, MD Pager: [phone] Office: [phone]

## 2019-11-05 ENCOUNTER — Ambulatory Visit: Payer: MEDICAID | Attending: Internal Medicine

## 2019-11-05 DIAGNOSIS — Z23 Encounter for immunization: Secondary | ICD-10-CM

## 2019-11-05 NOTE — Progress Notes (Signed)
   [id number]  Name:  Lucien Budney.    MRN: [id number] DOB: Apr 09, 1950  11/05/2019  Mr. Dieppa was observed post Covid-19 immunization for 15 minutes without incident. He was provided with Vaccine Information Sheet and instruction to access the V-Safe system.   Mr. Fidalgo was instructed to call 911 with any severe reactions post vaccine: Marland Kitchen Difficulty breathing  . Swelling of face and throat  . A fast heartbeat  . A bad rash all over body  . Dizziness and weakness

## 2019-11-11 DIAGNOSIS — E291 Testicular hypofunction: Secondary | ICD-10-CM | POA: Diagnosis not present

## 2019-11-25 ENCOUNTER — Other Ambulatory Visit: Payer: Self-pay

## 2019-11-25 ENCOUNTER — Ambulatory Visit: Payer: BC Managed Care – PPO

## 2019-11-25 ENCOUNTER — Other Ambulatory Visit: Payer: BC Managed Care – PPO

## 2019-11-25 DIAGNOSIS — R9431 Abnormal electrocardiogram [ECG] [EKG]: Secondary | ICD-10-CM | POA: Diagnosis not present

## 2019-11-25 DIAGNOSIS — E291 Testicular hypofunction: Secondary | ICD-10-CM | POA: Diagnosis not present

## 2019-12-06 DIAGNOSIS — E1129 Type 2 diabetes mellitus with other diabetic kidney complication: Secondary | ICD-10-CM | POA: Diagnosis not present

## 2019-12-06 DIAGNOSIS — R809 Proteinuria, unspecified: Secondary | ICD-10-CM | POA: Diagnosis not present

## 2019-12-06 DIAGNOSIS — N181 Chronic kidney disease, stage 1: Secondary | ICD-10-CM | POA: Diagnosis not present

## 2019-12-06 DIAGNOSIS — N179 Acute kidney failure, unspecified: Secondary | ICD-10-CM | POA: Diagnosis not present

## 2019-12-06 DIAGNOSIS — M109 Gout, unspecified: Secondary | ICD-10-CM | POA: Diagnosis not present

## 2019-12-06 DIAGNOSIS — I129 Hypertensive chronic kidney disease with stage 1 through stage 4 chronic kidney disease, or unspecified chronic kidney disease: Secondary | ICD-10-CM | POA: Diagnosis not present

## 2019-12-09 DIAGNOSIS — E291 Testicular hypofunction: Secondary | ICD-10-CM | POA: Diagnosis not present

## 2019-12-15 DIAGNOSIS — Z85828 Personal history of other malignant neoplasm of skin: Secondary | ICD-10-CM | POA: Diagnosis not present

## 2019-12-15 DIAGNOSIS — L578 Other skin changes due to chronic exposure to nonionizing radiation: Secondary | ICD-10-CM | POA: Diagnosis not present

## 2019-12-15 DIAGNOSIS — L570 Actinic keratosis: Secondary | ICD-10-CM | POA: Diagnosis not present

## 2019-12-15 DIAGNOSIS — D225 Melanocytic nevi of trunk: Secondary | ICD-10-CM | POA: Diagnosis not present

## 2019-12-15 DIAGNOSIS — L905 Scar conditions and fibrosis of skin: Secondary | ICD-10-CM | POA: Diagnosis not present

## 2019-12-19 DIAGNOSIS — R9431 Abnormal electrocardiogram [ECG] [EKG]: Secondary | ICD-10-CM | POA: Insufficient documentation

## 2019-12-19 DIAGNOSIS — I441 Atrioventricular block, second degree: Secondary | ICD-10-CM | POA: Insufficient documentation

## 2019-12-19 NOTE — Progress Notes (Signed)
Patient referred by Joshua Gravel, MD for irregular heart rhythm   Subjective:   [name]., male    DOB: 10-22-1950, 69 y.o.   MRN: [id number]   Chief Complaint  Patient presents with  . Abnormal ECG  . Follow-up  . Results    echo     HPI  69 y/o Caucasian male with hypertension, hyperlipidemia, IDDM, HIV, hemochromatosis, ED s/p radical prostatectomy, referred for evaluation of irregular heart rhythm.  Patient is doing well.  He does not have any symptoms of palpitations, presyncope or syncope.  Blood pressure elevated today.  It was lower, but checked by his nephrologist recently.  Negative reports this.  Recent echocardiogram results reviewed with the patient, details below.  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  He lives on the second floor and takes 20 steps routinely without any chest pain, shortness of breath presyncope or syncope.  He has mild leg swelling, controlled with Lasix.  He sees a nephrologist for chronic kidney disease with recent creatinine increased from 1.3-1.8.  He is following with his nephrologist in September.  Patient was recently seen by his PCP for EKG and showed abnormalities as detailed below.  He is currently on carvedilol 25 mg which she takes once a day.  He has been on this dose and frequency for several years.  Patient previously had an echocardiogram in 2017 during his admission with sepsis.  I personally and independently reviewed prior EKGs from 2017 and 2013.  Regarding his hemochromatosis, he is followed closely by hematologist.  While he has had phlebotomies in the past-one of them leading to vasovagal syncope several years ago-he has not required any phlebotomy recently.     Current Outpatient Medications on File Prior to Visit  Medication Sig Dispense Refill  . allopurinol (ZYLOPRIM)  300 MG tablet Take 300 mg by mouth daily.     Marland Kitchen amLODipine (NORVASC) 10 MG tablet Take 10 mg by mouth daily.    Marland Kitchen atorvastatin (LIPITOR) 10 MG tablet Take 10 mg by mouth at bedtime.    . colchicine 0.6 MG tablet Take 0.6 mg by mouth as needed (gout).     Marland Kitchen emtricitabine-rilpivir-tenofovir AF (ODEFSEY) 200-25-25 MG TABS tablet Take 1 tablet by mouth daily with breakfast. 90 tablet 4  . gabapentin (NEURONTIN) 300 MG capsule TAKE ONE CAPSULE BY MOUTH TWICE DAILY (Patient taking differently: Take 300 mg by mouth 3 (three) times daily. ) 30 capsule 1  . hydrALAZINE (APRESOLINE) 25 MG tablet Take 25 mg by mouth 3 (three) times daily.    [name] Ethyl (VASCEPA PO) Take 1 tablet by mouth 2 (two) times daily.     Marland Kitchen JARDIANCE 10 MG TABS tablet TK 1 T PO QD  6  . SOLIQUA 100-33 UNT-MCG/ML SOPN Injects 60 units daily  12  . tadalafil (CIALIS) 20 MG tablet Take 20 mg by mouth daily as needed for erectile dysfunction.    Marland Kitchen telmisartan (MICARDIS) 80 MG tablet Take 80 mg by mouth daily before breakfast.     . testosterone cypionate (DEPOTESTOSTERONE CYPIONATE) 200 MG/ML injection Inject 200 mg into the muscle every 14 (fourteen) days.    . vitamin B-12 (CYANOCOBALAMIN) 1000 MCG tablet Take 1,000 mcg by mouth daily.     No current facility-administered medications on file prior to visit.    Cardiovascular and other  pertinent studies:  EKG 12/21/2019: Sinus rhythm 63 bpm Mobitz type 1 AV block  Echocardiogram 11/25/2019:  Normal LV systolic function with EF 71%. Left ventricle cavity is normal  in size. Normal left ventricular wall thickness. Normal global wall  motion. Patient appeared to be in Wenckebach heart block by EKG during  eam. Hence diastolic function not performed. LVEDP appears to be elevated  by E/e'. Calculated EF 71%.  Structurally normal mitral valve. Mild (Grade I) mitral regurgitation.  E-wave dominant mitral inflow.  Structurally normal tricuspid valve. Mild tricuspid  regurgitation. No  evidence of pulmonary hypertension.  EKG 11/04/2019: Sinus rhythm 76 bpm FIrst degree AV block, PR interval 300 msec Left atrial enlargement Poor R wave progression  EKG 11/01/2019: Sinus rhythm Mobitz type 1 AV block  CTA chest 2010: No evidence of pulmonary embolus.  Minimal patchy left upper lobe ground-glass densities. This could  represent areas of atelectasis or alveolitis.  Heart borderline in size.   Coronary angiogram 2010:  1. No obstructive coronary artery disease.  2. Abnormal filling pattern in both the right and left coronary      distribution.  3. Normal left ventricular systolic function.   RECOMMENDATIONS:  I cannot explain the patient's severe shortness of  breath based on the findings of his coronary angiography or left  ventricular angiogram.  There is concern for pulmonary embolus.  The  patient will be taken for a stat CT angiogram to rule out pulmonary  embolus.  We will also get an echocardiogram after his CT scan.  We will  start a heparin drip here in the cath lab.  Of note, his right femoral  artery was sealed with an Angio-Seal femoral artery closure device.  Basic laboratory values have been sent including a metabolic profile,  CBC, D-dimer, BNP, and coagulation study.  Further plans to follow.    Recent labs: 10/14/2019: Glucose 108. BUN/Cr ?/1.8. eGFR 40. Na/K 143/4.1  H/H 16/48. MCV 101. Platelets 184. Chol 60, TG 247, HDL 29, LDL ? HbA1C 5.5% TSH 2.8   Review of Systems  Cardiovascular: Positive for leg swelling. Negative for chest pain, dyspnea on exertion, palpitations and syncope.         Vitals:   12/21/19 0827  BP: (!) 146/74  Pulse: 72  Resp: 16  SpO2: 97%     Body mass index is 33.97 kg/m. Filed Weights   12/21/19 0827  Weight: 230 lb (104.3 kg)     Objective:   Physical Exam Vitals and nursing note reviewed.  Constitutional:      General: He is not in acute distress. Neck:      Vascular: No JVD.  Cardiovascular:     Rate and Rhythm: Normal rate and regular rhythm.     Heart sounds: Normal heart sounds. No murmur heard.   Pulmonary:     Effort: Pulmonary effort is normal.     Breath sounds: Normal breath sounds. No wheezing or rales.  Musculoskeletal:     Right lower leg: Edema (1+) present.     Left lower leg: Edema (1+) present.          Assessment & Recommendations:   69 y/o Caucasian male with hypertension, hyperlipidemia, IDDM, HIV, hemochromatosis, ED s/p radical prostatectomy, Mobitz type I AV block   Mobitz type I AV block:  This has persisted even after stopping carvedilol.  However, patient has no symptoms.   No intervention necessary at this time.   Based on echocardiogram, there is no  evidence of cardiac involvement with hemochromatosis.  He does not have any symptoms suggestive of congestive heart failure.  Leg edema is more likely to be related to amlodipine.  Therefore, I think Mobitz type I AV block is an independent finding unrelated to his history of hemochromatosis.  Dyslipidemia: Elevated triglycerides.  He is currently on Vascepa, and now also on atorvastatin.  Continue follow-up with PCP regarding the same.  Hypertension: Elevated today, but usually lower than this.  Continue follow-up with PCP.  In future, he may need addition of another agent.  Also, amlodipine dose could be reduced to 5 mg to see if leg edema improves.  Continue follow-up with PCP.  I will see him on as-needed basis.    Nigel Mormon, MD Pager: [phone] Office: [phone]

## 2019-12-20 DIAGNOSIS — E1122 Type 2 diabetes mellitus with diabetic chronic kidney disease: Secondary | ICD-10-CM | POA: Diagnosis not present

## 2019-12-20 DIAGNOSIS — I129 Hypertensive chronic kidney disease with stage 1 through stage 4 chronic kidney disease, or unspecified chronic kidney disease: Secondary | ICD-10-CM | POA: Diagnosis not present

## 2019-12-21 ENCOUNTER — Encounter: Payer: Self-pay | Admitting: Cardiology

## 2019-12-21 ENCOUNTER — Ambulatory Visit: Payer: BC Managed Care – PPO | Admitting: Cardiology

## 2019-12-21 ENCOUNTER — Other Ambulatory Visit: Payer: Self-pay

## 2019-12-21 VITALS — BP 146/74 | HR 72 | Resp 16 | Ht 69.0 in | Wt 230.0 lb

## 2019-12-21 DIAGNOSIS — Z113 Encounter for screening for infections with a predominantly sexual mode of transmission: Secondary | ICD-10-CM

## 2019-12-21 DIAGNOSIS — I10 Essential (primary) hypertension: Secondary | ICD-10-CM

## 2019-12-21 DIAGNOSIS — I441 Atrioventricular block, second degree: Secondary | ICD-10-CM

## 2019-12-21 DIAGNOSIS — R9431 Abnormal electrocardiogram [ECG] [EKG]: Secondary | ICD-10-CM | POA: Diagnosis not present

## 2019-12-21 DIAGNOSIS — E118 Type 2 diabetes mellitus with unspecified complications: Secondary | ICD-10-CM

## 2019-12-21 DIAGNOSIS — Z79899 Other long term (current) drug therapy: Secondary | ICD-10-CM

## 2019-12-21 DIAGNOSIS — B20 Human immunodeficiency virus [HIV] disease: Secondary | ICD-10-CM

## 2019-12-23 DIAGNOSIS — E291 Testicular hypofunction: Secondary | ICD-10-CM | POA: Diagnosis not present

## 2019-12-26 DIAGNOSIS — M7542 Impingement syndrome of left shoulder: Secondary | ICD-10-CM | POA: Insufficient documentation

## 2019-12-29 DIAGNOSIS — M180 Bilateral primary osteoarthritis of first carpometacarpal joints: Secondary | ICD-10-CM | POA: Diagnosis not present

## 2020-01-06 DIAGNOSIS — E291 Testicular hypofunction: Secondary | ICD-10-CM | POA: Diagnosis not present

## 2020-01-20 DIAGNOSIS — E291 Testicular hypofunction: Secondary | ICD-10-CM | POA: Diagnosis not present

## 2020-01-27 DIAGNOSIS — N179 Acute kidney failure, unspecified: Secondary | ICD-10-CM | POA: Diagnosis not present

## 2020-01-27 DIAGNOSIS — N181 Chronic kidney disease, stage 1: Secondary | ICD-10-CM | POA: Diagnosis not present

## 2020-01-27 DIAGNOSIS — R809 Proteinuria, unspecified: Secondary | ICD-10-CM | POA: Diagnosis not present

## 2020-01-27 DIAGNOSIS — I129 Hypertensive chronic kidney disease with stage 1 through stage 4 chronic kidney disease, or unspecified chronic kidney disease: Secondary | ICD-10-CM | POA: Diagnosis not present

## 2020-02-01 DIAGNOSIS — E291 Testicular hypofunction: Secondary | ICD-10-CM | POA: Diagnosis not present

## 2020-02-09 DIAGNOSIS — E291 Testicular hypofunction: Secondary | ICD-10-CM | POA: Diagnosis not present

## 2020-02-09 DIAGNOSIS — Z125 Encounter for screening for malignant neoplasm of prostate: Secondary | ICD-10-CM | POA: Diagnosis not present

## 2020-02-09 DIAGNOSIS — E119 Type 2 diabetes mellitus without complications: Secondary | ICD-10-CM | POA: Diagnosis not present

## 2020-02-10 DIAGNOSIS — I129 Hypertensive chronic kidney disease with stage 1 through stage 4 chronic kidney disease, or unspecified chronic kidney disease: Secondary | ICD-10-CM | POA: Diagnosis not present

## 2020-02-10 DIAGNOSIS — N181 Chronic kidney disease, stage 1: Secondary | ICD-10-CM | POA: Diagnosis not present

## 2020-02-16 ENCOUNTER — Other Ambulatory Visit: Payer: Self-pay

## 2020-02-16 ENCOUNTER — Encounter: Payer: Self-pay | Admitting: Cardiology

## 2020-02-16 ENCOUNTER — Ambulatory Visit: Payer: BC Managed Care – PPO | Admitting: Cardiology

## 2020-02-16 VITALS — BP 154/62 | HR 78 | Ht 68.0 in | Wt 230.0 lb

## 2020-02-16 DIAGNOSIS — I441 Atrioventricular block, second degree: Secondary | ICD-10-CM | POA: Diagnosis not present

## 2020-02-16 NOTE — Progress Notes (Signed)
Electrophysiology Office Note   Date:  02/16/2020   ID:  [name]., DOB Sep 28, 1950, MRN [id number]  PCP:  Joshua Gravel, MD  Cardiologist:  [name] Primary Electrophysiologist:  Joshua Gullickson Meredith Leeds, MD    Chief Complaint: Mobitz 1 AV block   History of Present Illness: [name]. is a 69 y.o. male who is being seen today for the evaluation of Mobitz 1 AV block at the request of [name]. Presenting today for electrophysiology evaluation.    He has a history of hypertension, hyperlipidemia, insulin-dependent diabetes, HIV, hemochromatosis.  He had a prior ECG done that showed a Mobitz 1 AV block.  He had an echo performed that showed no evidence of hemochromatosis.  He does not do regular physical activity, though he is on his feet most days at work.  He lives on the second floor and can go up and down 20 stairs without shortness of breath, syncope, or presyncope.  He has some leg swelling that is controlled with Lasix.  Today, he denies symptoms of chest pain, shortness of breath, orthopnea, PND, lower extremity edema, claudication, dizziness, presyncope, syncope, bleeding, or neurologic sequela. The patient is tolerating medications without difficulties.  He does get episodic palpitations.  His palpitations occur a few times a week.  He says that he does not feel that his heart rate is fast, though he can feel occasional skipped beats.  Aside from that he is doing well.  He has no acute complaints.   Past Medical History:  Diagnosis Date  . Complication of anesthesia    ANESTHESIA AWARENESS  DURING ADRENALECTOMY AND COLONOSCOPY  . Diabetes mellitus    on oral meds-no insulin  . DIABETES MELLITUS, TYPE II, UNCONTROLLED 06/22/2006  . GILBERT'S SYNDROME 02/25/2007  . GOUT 06/22/2006  . HEMOCHROMATOSIS, HX OF 12/15/2005   phlebotomy every 6 to 8 weeks--at [facility]  . HIV disease ([place]) 1990   [facility]  .  HNP (herniated nucleus pulposus)    LUMBAR PAIN WITH PAIN DOWN LEFT LEG TO TOES-SOME NUMBNESS AND TINGLING IN BIG TOE  . Hypertension    Past Surgical History:  Procedure Laterality Date  . ADRENALECTOMY     RIGHT ADRENALECTOMY   . CHOLECYSTECTOMY    . LUMBAR LAMINECTOMY/DECOMPRESSION MICRODISCECTOMY  04/23/2011   Procedure: LUMBAR LAMINECTOMY/DECOMPRESSION MICRODISCECTOMY;  Surgeon: Joshua Hai, MD;  Location: [facility];  Service: Orthopedics;  Laterality: N/A;  Decompression L5 S1 (X-Ray)  . ORIF LEFT SHOULDER     STILL HAS HARDWARE IN THE SHOULDER     Current Outpatient Medications  Medication Sig Dispense Refill  . allopurinol (ZYLOPRIM) 300 MG tablet Take 300 mg by mouth daily.    Marland Kitchen amLODipine (NORVASC) 10 MG tablet Take 10 mg by mouth daily.    Marland Kitchen atorvastatin (LIPITOR) 10 MG tablet Take 10 mg by mouth at bedtime.    . colchicine 0.6 MG tablet Take 0.6 mg by mouth as needed (gout).     Marland Kitchen emtricitabine-rilpivir-tenofovir AF (ODEFSEY) 200-25-25 MG TABS tablet Take 1 tablet by mouth daily with breakfast. 90 tablet 4  . gabapentin (NEURONTIN) 300 MG capsule TAKE ONE CAPSULE BY MOUTH TWICE DAILY (Patient taking differently: Take 300 mg by mouth 3 (three) times daily.) 30 capsule 1  . hydrALAZINE (APRESOLINE) 25 MG tablet Take 25 mg by mouth 3 (three) times daily.    [name] Ethyl (VASCEPA PO) Take 1 tablet by mouth 2 (two) times daily.     Marland Kitchen  JARDIANCE 10 MG TABS tablet TK 1 T PO QD  6  . SOLIQUA 100-33 UNT-MCG/ML SOPN Injects 60 units daily  12  . tadalafil (CIALIS) 20 MG tablet Take 20 mg by mouth daily as needed for erectile dysfunction.    Marland Kitchen telmisartan (MICARDIS) 80 MG tablet Take 80 mg by mouth daily before breakfast.    . testosterone cypionate (DEPOTESTOSTERONE CYPIONATE) 200 MG/ML injection Inject 200 mg into the muscle every 14 (fourteen) days.    . vitamin B-12 (CYANOCOBALAMIN) 1000 MCG tablet Take 1,000 mcg by mouth daily.     No current facility-administered  medications for this visit.    Allergies:   Scopolamine, Codeine, and Meperidine hcl   Social History:  The patient  reports that he has never smoked. He has never used smokeless tobacco. He reports that he does not drink alcohol and does not use drugs.   Family History:  The patient's family history includes Atrial fibrillation in his father and mother; Hyperlipidemia in his father and mother; Hypertension in his father and mother.    ROS:  Please see the history of present illness.   Otherwise, review of systems is positive for none.   All other systems are reviewed and negative.    PHYSICAL EXAM: VS:  BP (!) 154/62   Pulse 78   Ht 5\' 8"  (1.727 m)   Wt 230 lb (104.3 kg)   SpO2 97%   BMI 34.97 kg/m  , BMI Body mass index is 34.97 kg/m. GEN: Well nourished, well developed, in no acute distress  HEENT: normal  Neck: no JVD, carotid bruits, or masses Cardiac: RRR; no murmurs, rubs, or gallops,no edema  Respiratory:  clear to auscultation bilaterally, normal work of breathing GI: soft, nontender, nondistended, + BS MS: no deformity or atrophy  Skin: warm and dry Neuro:  Strength and sensation are intact Psych: euthymic mood, full affect  EKG:  EKG is ordered today. Personal review of the ekg ordered shows sinus rhythm, Mobitz 1 AV block, rate 78  Recent Labs: 05/19/2019: ALT 16; BUN 26; Creat 1.30; Hemoglobin 16.2; Platelets 235; Potassium 4.1; Sodium 139    Lipid Panel     Component Value Date/Time   CHOL 65 05/19/2019 1605   TRIG 225 (H) 05/19/2019 1605   HDL 35 (L) 05/19/2019 1605   CHOLHDL 1.9 05/19/2019 1605   VLDL NOT CALC 04/22/2016 1536   LDLCALC <10 05/19/2019 1605   LDLDIRECT 55.9 11/08/2010 0835     Wt Readings from Last 3 Encounters:  02/16/20 230 lb (104.3 kg)  12/21/19 230 lb (104.3 kg)  11/04/19 231 lb (104.8 kg)      Other studies Reviewed: Additional studies/ records that were reviewed today include: TTE 11/25/19  Review of the above records  today demonstrates:  Normal LV systolic function with EF 71%. Left ventricle cavity is normal  in size. Normal left ventricular wall thickness. Normal global wall  motion. Patient appeared to be in Wenckebach heart block by EKG during  eam. Hence diastolic function not performed. LVEDP appears to be elevated  by E/e'. Calculated EF 71%.  Structurally normal mitral valve. Mild (Grade I) mitral regurgitation.  E-wave dominant mitral inflow.  Structurally normal tricuspid valve. Mild tricuspid regurgitation. No  evidence of pulmonary hypertension.   ASSESSMENT AND PLAN:  1.  Mobitz 1 AV block: This is persisted despite stopping carvedilol.  He has no obvious symptoms.  His echo shows no cardiac involvement from his hemochromatosis.  At this point, due  to his lack of symptoms, would continue to monitor.  If he does develop symptoms, further outpatient monitoring would be indicated.  2.  Hyperlipidemia: Continue Vascepa and atorvastatin.  3.  Hypertension: Blood pressure is elevated today.  He has been working on it with his nephrologist.  Case discussed with referring physician  Current medicines are reviewed at length with the patient today.   The patient does not have concerns regarding his medicines.  The following changes were made today:  none  Labs/ tests ordered today include:  Orders Placed This Encounter  Procedures  . EKG 12-Lead     Disposition:   FU with [name] as needed months  Signed, Everton Bertha Meredith Leeds, MD  02/16/2020 9:16 AM     [facility] [address] [place] [address] [phone] (office) [phone] (fax)

## 2020-02-29 DIAGNOSIS — E291 Testicular hypofunction: Secondary | ICD-10-CM | POA: Diagnosis not present

## 2020-03-16 DIAGNOSIS — E291 Testicular hypofunction: Secondary | ICD-10-CM | POA: Diagnosis not present

## 2020-03-29 DIAGNOSIS — E291 Testicular hypofunction: Secondary | ICD-10-CM | POA: Diagnosis not present

## 2020-04-06 DIAGNOSIS — Z23 Encounter for immunization: Secondary | ICD-10-CM | POA: Diagnosis not present

## 2020-04-12 DIAGNOSIS — J208 Acute bronchitis due to other specified organisms: Secondary | ICD-10-CM | POA: Diagnosis not present

## 2020-04-12 DIAGNOSIS — B9689 Other specified bacterial agents as the cause of diseases classified elsewhere: Secondary | ICD-10-CM | POA: Diagnosis not present

## 2020-04-12 DIAGNOSIS — E1142 Type 2 diabetes mellitus with diabetic polyneuropathy: Secondary | ICD-10-CM | POA: Insufficient documentation

## 2020-04-12 DIAGNOSIS — N529 Male erectile dysfunction, unspecified: Secondary | ICD-10-CM | POA: Insufficient documentation

## 2020-04-12 DIAGNOSIS — J0141 Acute recurrent pansinusitis: Secondary | ICD-10-CM | POA: Diagnosis not present

## 2020-04-12 DIAGNOSIS — J060 Acute laryngopharyngitis: Secondary | ICD-10-CM | POA: Diagnosis not present

## 2020-04-12 DIAGNOSIS — I499 Cardiac arrhythmia, unspecified: Secondary | ICD-10-CM | POA: Insufficient documentation

## 2020-04-27 DIAGNOSIS — E291 Testicular hypofunction: Secondary | ICD-10-CM | POA: Diagnosis not present

## 2020-04-27 DIAGNOSIS — E118 Type 2 diabetes mellitus with unspecified complications: Secondary | ICD-10-CM | POA: Diagnosis not present

## 2020-04-27 DIAGNOSIS — E119 Type 2 diabetes mellitus without complications: Secondary | ICD-10-CM | POA: Diagnosis not present

## 2020-04-27 DIAGNOSIS — N181 Chronic kidney disease, stage 1: Secondary | ICD-10-CM | POA: Diagnosis not present

## 2020-04-27 DIAGNOSIS — N179 Acute kidney failure, unspecified: Secondary | ICD-10-CM | POA: Diagnosis not present

## 2020-04-27 DIAGNOSIS — E785 Hyperlipidemia, unspecified: Secondary | ICD-10-CM | POA: Diagnosis not present

## 2020-04-27 DIAGNOSIS — Z125 Encounter for screening for malignant neoplasm of prostate: Secondary | ICD-10-CM | POA: Diagnosis not present

## 2020-04-27 DIAGNOSIS — R809 Proteinuria, unspecified: Secondary | ICD-10-CM | POA: Diagnosis not present

## 2020-04-27 DIAGNOSIS — I129 Hypertensive chronic kidney disease with stage 1 through stage 4 chronic kidney disease, or unspecified chronic kidney disease: Secondary | ICD-10-CM | POA: Diagnosis not present

## 2020-05-01 ENCOUNTER — Other Ambulatory Visit (HOSPITAL_COMMUNITY)
Admission: RE | Admit: 2020-05-01 | Discharge: 2020-05-01 | Disposition: A | Discharge status: Home / Self Care | Payer: BC Managed Care – PPO | Source: Ambulatory Visit | Attending: Infectious Diseases | Admitting: Infectious Diseases

## 2020-05-01 ENCOUNTER — Other Ambulatory Visit: Payer: BC Managed Care – PPO

## 2020-05-01 ENCOUNTER — Other Ambulatory Visit: Payer: Self-pay

## 2020-05-01 DIAGNOSIS — Z113 Encounter for screening for infections with a predominantly sexual mode of transmission: Secondary | ICD-10-CM

## 2020-05-01 DIAGNOSIS — E7849 Other hyperlipidemia: Secondary | ICD-10-CM | POA: Diagnosis not present

## 2020-05-01 DIAGNOSIS — B20 Human immunodeficiency virus [HIV] disease: Secondary | ICD-10-CM

## 2020-05-02 LAB — URINE CYTOLOGY ANCILLARY ONLY
Chlamydia: NEGATIVE
Comment: NEGATIVE
Comment: NORMAL
Neisseria Gonorrhea: NEGATIVE

## 2020-05-02 LAB — T-HELPER CELL (CD4) - (RCID CLINIC ONLY)
CD4 % Helper T Cell: 15 % — ABNORMAL LOW (ref 33–65)
CD4 T Cell Abs: 549 /uL (ref 400–1790)

## 2020-05-04 DIAGNOSIS — E291 Testicular hypofunction: Secondary | ICD-10-CM | POA: Diagnosis not present

## 2020-05-04 DIAGNOSIS — Z0000 Encounter for general adult medical examination without abnormal findings: Secondary | ICD-10-CM | POA: Diagnosis not present

## 2020-05-04 DIAGNOSIS — B20 Human immunodeficiency virus [HIV] disease: Secondary | ICD-10-CM | POA: Diagnosis not present

## 2020-05-04 DIAGNOSIS — E1129 Type 2 diabetes mellitus with other diabetic kidney complication: Secondary | ICD-10-CM | POA: Diagnosis not present

## 2020-05-04 LAB — COMPLETE METABOLIC PANEL WITH GFR
AG Ratio: 1.7 (calc) (ref 1.0–2.5)
ALT: 16 U/L (ref 9–46)
AST: 18 U/L (ref 10–35)
Albumin: 4.6 g/dL (ref 3.6–5.1)
Alkaline phosphatase (APISO): 93 U/L (ref 35–144)
BUN/Creatinine Ratio: 18 (calc) (ref 6–22)
BUN: 28 mg/dL — ABNORMAL HIGH (ref 7–25)
CO2: 25 mmol/L (ref 20–32)
Calcium: 9.8 mg/dL (ref 8.6–10.3)
Chloride: 106 mmol/L (ref 98–110)
Creat: 1.52 mg/dL — ABNORMAL HIGH (ref 0.70–1.25)
GFR, Est African American: 54 mL/min/{1.73_m2} — ABNORMAL LOW (ref >=60)
GFR, Est Non African American: 46 mL/min/{1.73_m2} — ABNORMAL LOW (ref >=60)
Globulin: 2.7 g/dL (calc) (ref 1.9–3.7)
Glucose, Bld: 96 mg/dL (ref 65–99)
Potassium: 5.3 mmol/L (ref 3.5–5.3)
Sodium: 141 mmol/L (ref 135–146)
Total Bilirubin: 1.7 mg/dL — ABNORMAL HIGH (ref 0.2–1.2)
Total Protein: 7.3 g/dL (ref 6.1–8.1)

## 2020-05-04 LAB — HIV-1 RNA QUANT-NO REFLEX-BLD
HIV 1 RNA Quant: <20 Copies/mL
HIV-1 RNA Quant, Log: <1.3 Log cps/mL

## 2020-05-04 LAB — CBC WITH DIFFERENTIAL/PLATELET
Absolute Monocytes: 525 cells/uL (ref 200–950)
Basophils Absolute: 43 cells/uL (ref 0–200)
Basophils Relative: 0.5 %
Eosinophils Absolute: 198 cells/uL (ref 15–500)
Eosinophils Relative: 2.3 %
HCT: 44 % (ref 38.5–50.0)
Hemoglobin: 15.8 g/dL (ref 13.2–17.1)
Lymphs Abs: 3861 cells/uL (ref 850–3900)
MCH: 35.3 pg — ABNORMAL HIGH (ref 27.0–33.0)
MCHC: 35.9 g/dL (ref 32.0–36.0)
MCV: 98.4 fL (ref 80.0–100.0)
MPV: 11.1 fL (ref 7.5–12.5)
Monocytes Relative: 6.1 %
Neutro Abs: 3973 cells/uL (ref 1500–7800)
Neutrophils Relative %: 46.2 %
Platelets: 196 10*3/uL (ref 140–400)
RBC: 4.47 10*6/uL (ref 4.20–5.80)
RDW: 13.1 % (ref 11.0–15.0)
Total Lymphocyte: 44.9 %
WBC: 8.6 10*3/uL (ref 3.8–10.8)

## 2020-05-04 LAB — LIPID PANEL
Cholesterol: 87 mg/dL (ref <200)
HDL: 32 mg/dL — ABNORMAL LOW (ref >=40)
LDL Cholesterol (Calc): 20 mg/dL (calc)
Non-HDL Cholesterol (Calc): 55 mg/dL (calc) (ref <130)
Total CHOL/HDL Ratio: 2.7 (calc) (ref <5.0)
Triglycerides: 331 mg/dL — ABNORMAL HIGH (ref <150)

## 2020-05-04 LAB — RPR: RPR Ser Ql: NONREACTIVE

## 2020-05-07 ENCOUNTER — Other Ambulatory Visit: Payer: Self-pay | Admitting: Infectious Diseases

## 2020-05-07 DIAGNOSIS — B20 Human immunodeficiency virus [HIV] disease: Secondary | ICD-10-CM

## 2020-05-11 DIAGNOSIS — E291 Testicular hypofunction: Secondary | ICD-10-CM | POA: Diagnosis not present

## 2020-05-15 ENCOUNTER — Other Ambulatory Visit: Payer: Self-pay

## 2020-05-15 ENCOUNTER — Ambulatory Visit: Payer: BC Managed Care – PPO | Admitting: Infectious Diseases

## 2020-05-15 ENCOUNTER — Encounter: Payer: Self-pay | Admitting: Infectious Diseases

## 2020-05-15 VITALS — BP 154/66 | HR 62 | Temp 98.6°F | Wt 232.7 lb

## 2020-05-15 DIAGNOSIS — Z23 Encounter for immunization: Secondary | ICD-10-CM | POA: Diagnosis not present

## 2020-05-15 DIAGNOSIS — B20 Human immunodeficiency virus [HIV] disease: Secondary | ICD-10-CM

## 2020-05-15 NOTE — Progress Notes (Signed)
   Subjective:    Patient ID: Joshua Stead., male  DOB: 07-15-1950, 70 y.o.        MRN: [id number]   HPI 70yo M with hx of hemochromatosis, DM2 (dx 2007), secondary hypogonadism (on clomid), CKD3,hyperlipidemia, lumbar laminectomy/decompression (04-2011), and HIV+ since 1990 when he was dx on insurance physical. He has been maintained on [place] -->changed to [facility] 2017.  He is completely COVID vaccinated. He had cataract surgery in Jan 2021 and he is glasses free for first time in 50 yrs.  He has been doing well with his DM- last A1C was 6. Was as low as 5.3% last year.  No problems with odefsey, takes after at HS. Sees podiatry (06-09-19) No foot wounds. Examines daily.  Was seen by CV for Mobitz 1 block. Told not to worry about it unless sx worse.   HIV 1 RNA Quant  Date Value  05/01/2020 <20 Copies/mL  05/19/2019 <20 NOT DETECTED copies/mL  04/15/2018 <20 NOT DETECTED copies/mL   CD4 T Cell Abs (/uL)  Date Value  05/01/2020 549  05/19/2019 858  04/15/2018 600     Health Maintenance  Topic Date Due  . HEMOGLOBIN A1C  09/28/2017  . OPHTHALMOLOGY EXAM  02/07/2018  . FOOT EXAM  04/15/2018  . PNA vac Low Risk Adult (2 of 2 - PPSV23) 10/09/2019  . COVID-19 Vaccine (4 - Booster for Moderna series) 05/07/2020  . COLONOSCOPY (Pts 45-84yrs Insurance coverage will need to be confirmed)  05/28/2026  . TETANUS/TDAP  04/16/2027  . INFLUENZA VACCINE  Completed  . Hepatitis C Screening  Completed  . HPV VACCINES  Aged Out      Review of Systems  Constitutional: Negative for chills, fever and weight loss.  Respiratory: Negative for cough and shortness of breath.   Gastrointestinal: Negative for constipation and diarrhea.  Genitourinary: Negative for dysuria.  Neurological: Positive for sensory change.    Please see HPI. All other systems reviewed and negative.     Objective:  Physical Exam Vitals reviewed.  Constitutional:      Appearance: He is obese.  HENT:      Mouth/Throat:     Mouth: Mucous membranes are moist.     Pharynx: No oropharyngeal exudate.  Eyes:     Extraocular Movements: Extraocular movements intact.     Pupils: Pupils are equal, round, and reactive to light.  Cardiovascular:     Rate and Rhythm: Normal rate. Rhythm irregular.  Pulmonary:     Effort: Pulmonary effort is normal.     Breath sounds: Normal breath sounds.  Abdominal:     General: Bowel sounds are normal. There is no distension.     Palpations: Abdomen is soft.     Tenderness: There is no abdominal tenderness.  Musculoskeletal:     Cervical back: Normal range of motion and neck supple.     Right lower leg: Edema present.     Left lower leg: Edema present.  Neurological:     General: No focal deficit present.     Mental Status: He is alert.  Psychiatric:        Mood and Affect: Mood normal.            Assessment & Plan:

## 2020-05-15 NOTE — Assessment & Plan Note (Signed)
He is doing well.  He has f/u with PCP on metformin.

## 2020-05-15 NOTE — Assessment & Plan Note (Signed)
Appreciate EP f/u Holding off on pacer, asx.

## 2020-05-15 NOTE — Addendum Note (Signed)
Addended by: Ketty Bitton C on: 05/15/2020 04:41 PM   Modules accepted: Orders

## 2020-05-15 NOTE — Assessment & Plan Note (Signed)
He is doing well HIV 1 RNA Quant  Date Value  05/01/2020 <20 Copies/mL  05/19/2019 <20 NOT DETECTED copies/mL  04/15/2018 <20 NOT DETECTED copies/mL   CD4 T Cell Abs (/uL)  Date Value  05/01/2020 549  05/19/2019 858  04/15/2018 600   PCV 23 today.  Labs discussed.  Appreciate PCP f/u.  rtc in 9 months.

## 2020-05-15 NOTE — Progress Notes (Signed)
   Subjective:    Patient ID: [name]., male  DOB: [birth year], 70 y.o.        MRN: [id number]   HPI 70yo M with hx of hemochromatosis, DM2 (dx 2007), secondary hypogonadism (on clomid), CKD3,hyperlipidemia, lumbar laminectomy/decompression (04-2011), and HIV+ since 1990 when he was dx on insurance physical. He has been maintained on [place] -->changed to [facility] 2017.  He is completely COVID vaccinated. He had cataract surgery in Jan and he is glasses free for first time in 50 yrs.   He has been doing well with his DM- last A1C was 6.0 (has been as low as 5.3 over last year). Back on metformin.  No problems with odefsey, takes after at HS. Sees podiatry yearly. No foot wounds, examines every AM Since last visit was noted to have heart block and irregular pulse. Stopped carvedilol. Then onto hydralazine, no change. Saw EP and was told "not to worry about it (unless worsening sx)". Marland Kitchen HIV 1 RNA Quant  Date Value  05/01/2020 <20 Copies/mL  05/19/2019 <20 NOT DETECTED copies/mL  04/15/2018 <20 NOT DETECTED copies/mL   CD4 T Cell Abs (/uL)  Date Value  05/01/2020 549  05/19/2019 858  04/15/2018 600     Health Maintenance  Topic Date Due  . HEMOGLOBIN A1C  09/28/2017  . OPHTHALMOLOGY EXAM  02/07/2018  . FOOT EXAM  04/15/2018  . PNA vac Low Risk Adult (2 of 2 - PPSV23) 10/09/2019  . COVID-19 Vaccine (4 - Booster for Moderna series) 05/07/2020  . COLONOSCOPY (Pts 45-43yrs Insurance coverage will need to be confirmed)  05/28/2026  . TETANUS/TDAP  04/16/2027  . INFLUENZA VACCINE  Completed  . Hepatitis C Screening  Completed  . HPV VACCINES  Aged Out      ROS  Please see HPI. All other systems reviewed and negative.     Objective:  Physical Exam         Assessment & Plan:

## 2020-05-15 NOTE — Addendum Note (Signed)
Addended by: Aundria Rud on: 05/15/2020 04:48 PM   Modules accepted: Orders

## 2020-05-25 DIAGNOSIS — E291 Testicular hypofunction: Secondary | ICD-10-CM | POA: Diagnosis not present

## 2020-06-07 ENCOUNTER — Other Ambulatory Visit: Payer: Self-pay

## 2020-06-07 DIAGNOSIS — B20 Human immunodeficiency virus [HIV] disease: Secondary | ICD-10-CM

## 2020-06-07 DIAGNOSIS — E291 Testicular hypofunction: Secondary | ICD-10-CM | POA: Diagnosis not present

## 2020-06-07 MED ORDER — ODEFSEY 200-25-25 MG PO TABS: 1.0000 | ORAL_TABLET | Freq: Every day | ORAL | 4 refills | Status: DC

## 2020-06-11 DIAGNOSIS — E1142 Type 2 diabetes mellitus with diabetic polyneuropathy: Secondary | ICD-10-CM | POA: Diagnosis not present

## 2020-06-11 DIAGNOSIS — E291 Testicular hypofunction: Secondary | ICD-10-CM | POA: Diagnosis not present

## 2020-06-11 DIAGNOSIS — Z125 Encounter for screening for malignant neoplasm of prostate: Secondary | ICD-10-CM | POA: Diagnosis not present

## 2020-06-15 DIAGNOSIS — E291 Testicular hypofunction: Secondary | ICD-10-CM | POA: Diagnosis not present

## 2020-06-21 DIAGNOSIS — E291 Testicular hypofunction: Secondary | ICD-10-CM | POA: Diagnosis not present

## 2020-07-03 DIAGNOSIS — Z949 Transplanted organ and tissue status, unspecified: Secondary | ICD-10-CM | POA: Diagnosis not present

## 2020-07-03 DIAGNOSIS — L821 Other seborrheic keratosis: Secondary | ICD-10-CM | POA: Diagnosis not present

## 2020-07-03 DIAGNOSIS — D485 Neoplasm of uncertain behavior of skin: Secondary | ICD-10-CM | POA: Diagnosis not present

## 2020-07-03 DIAGNOSIS — I872 Venous insufficiency (chronic) (peripheral): Secondary | ICD-10-CM | POA: Diagnosis not present

## 2020-07-03 DIAGNOSIS — Z85828 Personal history of other malignant neoplasm of skin: Secondary | ICD-10-CM | POA: Diagnosis not present

## 2020-07-03 DIAGNOSIS — C44519 Basal cell carcinoma of skin of other part of trunk: Secondary | ICD-10-CM | POA: Diagnosis not present

## 2020-07-05 DIAGNOSIS — E291 Testicular hypofunction: Secondary | ICD-10-CM | POA: Diagnosis not present

## 2020-07-13 ENCOUNTER — Ambulatory Visit: Payer: BC Managed Care – PPO | Admitting: Podiatry

## 2020-07-19 DIAGNOSIS — E291 Testicular hypofunction: Secondary | ICD-10-CM | POA: Diagnosis not present

## 2020-07-19 DIAGNOSIS — C44519 Basal cell carcinoma of skin of other part of trunk: Secondary | ICD-10-CM | POA: Diagnosis not present

## 2020-07-20 DIAGNOSIS — I129 Hypertensive chronic kidney disease with stage 1 through stage 4 chronic kidney disease, or unspecified chronic kidney disease: Secondary | ICD-10-CM | POA: Diagnosis not present

## 2020-07-20 DIAGNOSIS — N179 Acute kidney failure, unspecified: Secondary | ICD-10-CM | POA: Diagnosis not present

## 2020-07-20 DIAGNOSIS — R809 Proteinuria, unspecified: Secondary | ICD-10-CM | POA: Diagnosis not present

## 2020-07-20 DIAGNOSIS — N182 Chronic kidney disease, stage 2 (mild): Secondary | ICD-10-CM | POA: Diagnosis not present

## 2020-07-20 DIAGNOSIS — E1129 Type 2 diabetes mellitus with other diabetic kidney complication: Secondary | ICD-10-CM | POA: Diagnosis not present

## 2020-07-24 ENCOUNTER — Other Ambulatory Visit: Payer: Self-pay

## 2020-07-24 ENCOUNTER — Encounter: Payer: Self-pay | Admitting: Podiatry

## 2020-07-24 ENCOUNTER — Ambulatory Visit (INDEPENDENT_AMBULATORY_CARE_PROVIDER_SITE_OTHER): Payer: BC Managed Care – PPO | Admitting: Podiatry

## 2020-07-24 DIAGNOSIS — E1149 Type 2 diabetes mellitus with other diabetic neurological complication: Secondary | ICD-10-CM

## 2020-07-24 DIAGNOSIS — M79675 Pain in left toe(s): Secondary | ICD-10-CM

## 2020-07-24 DIAGNOSIS — B351 Tinea unguium: Secondary | ICD-10-CM | POA: Diagnosis not present

## 2020-07-24 DIAGNOSIS — E119 Type 2 diabetes mellitus without complications: Secondary | ICD-10-CM

## 2020-07-24 DIAGNOSIS — R7989 Other specified abnormal findings of blood chemistry: Secondary | ICD-10-CM | POA: Insufficient documentation

## 2020-07-24 DIAGNOSIS — E1121 Type 2 diabetes mellitus with diabetic nephropathy: Secondary | ICD-10-CM | POA: Insufficient documentation

## 2020-07-24 DIAGNOSIS — B353 Tinea pedis: Secondary | ICD-10-CM

## 2020-07-24 DIAGNOSIS — I129 Hypertensive chronic kidney disease with stage 1 through stage 4 chronic kidney disease, or unspecified chronic kidney disease: Secondary | ICD-10-CM | POA: Insufficient documentation

## 2020-07-24 DIAGNOSIS — M79674 Pain in right toe(s): Secondary | ICD-10-CM

## 2020-07-24 MED ORDER — KETOCONAZOLE 2 % EX CREA: 1.0000 "application " | TOPICAL_CREAM | Freq: Every day | CUTANEOUS | 0 refills | Status: DC

## 2020-07-24 NOTE — Patient Instructions (Signed)
Diabetes Mellitus and Foot Care Foot care is an important part of your health, especially when you have diabetes. Diabetes may cause you to have problems because of poor blood flow (circulation) to your feet and legs, which can cause your skin to:  Become thinner and drier.  Break more easily.  Heal more slowly.  Peel and crack. You may also have nerve damage (neuropathy) in your legs and feet, causing decreased feeling in them. This means that you may not notice minor injuries to your feet that could lead to more serious problems. Noticing and addressing any potential problems early is the best way to prevent future foot problems. How to care for your feet Foot hygiene  Wash your feet daily with warm water and mild soap. Do not use hot water. Then, pat your feet and the areas between your toes until they are completely dry. Do not soak your feet as this can dry your skin.  Trim your toenails straight across. Do not dig under them or around the cuticle. File the edges of your nails with an emery board or nail file.  Apply a moisturizing lotion or petroleum jelly to the skin on your feet and to dry, brittle toenails. Use lotion that does not contain alcohol and is unscented. Do not apply lotion between your toes.   Shoes and socks  Wear clean socks or stockings every day. Make sure they are not too tight. Do not wear knee-high stockings since they may decrease blood flow to your legs.  Wear shoes that fit properly and have enough cushioning. Always look in your shoes before you put them on to be sure there are no objects inside.  To break in new shoes, wear them for just a few hours a day. This prevents injuries on your feet. Wounds, scrapes, corns, and calluses  Check your feet daily for blisters, cuts, bruises, sores, and redness. If you cannot see the bottom of your feet, use a mirror or ask someone for help.  Do not cut corns or calluses or try to remove them with medicine.  If you  find a minor scrape, cut, or break in the skin on your feet, keep it and the skin around it clean and dry. You may clean these areas with mild soap and water. Do not clean the area with peroxide, alcohol, or iodine.  If you have a wound, scrape, corn, or callus on your foot, look at it several times a day to make sure it is healing and not infected. Check for: ? Redness, swelling, or pain. ? Fluid or blood. ? Warmth. ? Pus or a bad smell.   General tips  Do not cross your legs. This may decrease blood flow to your feet.  Do not use heating pads or hot water bottles on your feet. They may burn your skin. If you have lost feeling in your feet or legs, you may not know this is happening until it is too late.  Protect your feet from hot and cold by wearing shoes, such as at the beach or on hot pavement.  Schedule a complete foot exam at least once a year (annually) or more often if you have foot problems. Report any cuts, sores, or bruises to your health care provider immediately. Where to find more information  American Diabetes Association: www.diabetes.org  Association of Diabetes Care & Education Specialists: www.diabeteseducator.org Contact a health care provider if:  You have a medical condition that increases your risk of infection and   you have any cuts, sores, or bruises on your feet.  You have an injury that is not healing.  You have redness on your legs or feet.  You feel burning or tingling in your legs or feet.  You have pain or cramps in your legs and feet.  Your legs or feet are numb.  Your feet always feel cold.  You have pain around any toenails. Get help right away if:  You have a wound, scrape, corn, or callus on your foot and: ? You have pain, swelling, or redness that gets worse. ? You have fluid or blood coming from the wound, scrape, corn, or callus. ? Your wound, scrape, corn, or callus feels warm to the touch. ? You have pus or a bad smell coming from  the wound, scrape, corn, or callus. ? You have a fever. ? You have a red line going up your leg. Summary  Check your feet every day for blisters, cuts, bruises, sores, and redness.  Apply a moisturizing lotion or petroleum jelly to the skin on your feet and to dry, brittle toenails.  Wear shoes that fit properly and have enough cushioning.  If you have foot problems, report any cuts, sores, or bruises to your health care provider immediately.  Schedule a complete foot exam at least once a year (annually) or more often if you have foot problems. This information is not intended to replace advice given to you by your health care provider. Make sure you discuss any questions you have with your health care provider. Document Revised: 09/15/2019 Document Reviewed: 09/15/2019 Elsevier Patient Education  2021 Elsevier Inc.  

## 2020-07-26 NOTE — Progress Notes (Signed)
Subjective: 70 y.o. returns the office today for painful, elongated, thickened toenails which he cannot trim himself well for diabetic foot exam. Denies any redness or drainage around the nails. Denies any acute changes since last appointment and no new complaints today. Denies any systemic complaints such as fevers, chills, nausea, vomiting.   PCP: [name], MD  Objective: AAO 3, NAD DP/PT pulses palpable, CRT less than 3 seconds Protective sensation decreased with Simms Weinstein monofilament Nails hypertrophic, dystrophic, elongated, brittle, discolored 10. There is tenderness overlying the nails 1-5 bilaterally. There is no surrounding erythema or drainage along the nail sites. Dry, peeling skin present plantar aspect of feet with any skin openings, fissures or ulcerations. No open lesions or pre-ulcerative lesions are identified. No pain with calf compression, swelling, warmth, erythema.  Assessment: Patient presents with symptomatic onychomycosis, tinea pedis  Plan: -Treatment options including alternatives, risks, complications were discussed -Nails sharply debrided 10 without complication/bleeding. -Prescribed ketoconazole -Discussed daily foot inspection. If there are any changes, to call the office immediately.  -Follow-up in 3 months or sooner if any problems are to arise. In the meantime, encouraged to call the office with any questions, concerns, changes symptoms.  Celesta Gentile, DPM

## 2020-08-02 DIAGNOSIS — E291 Testicular hypofunction: Secondary | ICD-10-CM | POA: Diagnosis not present

## 2020-08-10 DIAGNOSIS — E291 Testicular hypofunction: Secondary | ICD-10-CM | POA: Diagnosis not present

## 2020-08-16 DIAGNOSIS — E291 Testicular hypofunction: Secondary | ICD-10-CM | POA: Diagnosis not present

## 2020-08-29 DIAGNOSIS — Z961 Presence of intraocular lens: Secondary | ICD-10-CM | POA: Diagnosis not present

## 2020-08-29 DIAGNOSIS — H5212 Myopia, left eye: Secondary | ICD-10-CM | POA: Diagnosis not present

## 2020-08-29 DIAGNOSIS — E113291 Type 2 diabetes mellitus with mild nonproliferative diabetic retinopathy without macular edema, right eye: Secondary | ICD-10-CM | POA: Diagnosis not present

## 2020-08-30 DIAGNOSIS — E291 Testicular hypofunction: Secondary | ICD-10-CM | POA: Diagnosis not present

## 2020-09-13 DIAGNOSIS — E291 Testicular hypofunction: Secondary | ICD-10-CM | POA: Diagnosis not present

## 2020-09-26 DIAGNOSIS — E291 Testicular hypofunction: Secondary | ICD-10-CM | POA: Diagnosis not present

## 2020-10-11 DIAGNOSIS — E291 Testicular hypofunction: Secondary | ICD-10-CM | POA: Diagnosis not present

## 2020-10-25 DIAGNOSIS — E291 Testicular hypofunction: Secondary | ICD-10-CM | POA: Diagnosis not present

## 2020-10-26 DIAGNOSIS — E1129 Type 2 diabetes mellitus with other diabetic kidney complication: Secondary | ICD-10-CM | POA: Diagnosis not present

## 2020-10-26 DIAGNOSIS — N182 Chronic kidney disease, stage 2 (mild): Secondary | ICD-10-CM | POA: Diagnosis not present

## 2020-10-26 DIAGNOSIS — I129 Hypertensive chronic kidney disease with stage 1 through stage 4 chronic kidney disease, or unspecified chronic kidney disease: Secondary | ICD-10-CM | POA: Diagnosis not present

## 2020-10-26 DIAGNOSIS — R809 Proteinuria, unspecified: Secondary | ICD-10-CM | POA: Diagnosis not present

## 2020-10-26 DIAGNOSIS — N179 Acute kidney failure, unspecified: Secondary | ICD-10-CM | POA: Diagnosis not present

## 2020-11-08 DIAGNOSIS — E291 Testicular hypofunction: Secondary | ICD-10-CM | POA: Diagnosis not present

## 2020-11-13 DIAGNOSIS — M545 Low back pain, unspecified: Secondary | ICD-10-CM | POA: Diagnosis not present

## 2020-11-22 DIAGNOSIS — E291 Testicular hypofunction: Secondary | ICD-10-CM | POA: Diagnosis not present

## 2020-11-25 ENCOUNTER — Ambulatory Visit: Payer: BC Managed Care – PPO

## 2020-12-06 DIAGNOSIS — E291 Testicular hypofunction: Secondary | ICD-10-CM | POA: Diagnosis not present

## 2020-12-10 DIAGNOSIS — M5416 Radiculopathy, lumbar region: Secondary | ICD-10-CM | POA: Diagnosis not present

## 2020-12-14 DIAGNOSIS — E291 Testicular hypofunction: Secondary | ICD-10-CM | POA: Diagnosis not present

## 2020-12-14 DIAGNOSIS — E1142 Type 2 diabetes mellitus with diabetic polyneuropathy: Secondary | ICD-10-CM | POA: Diagnosis not present

## 2020-12-14 DIAGNOSIS — Z125 Encounter for screening for malignant neoplasm of prostate: Secondary | ICD-10-CM | POA: Diagnosis not present

## 2020-12-21 DIAGNOSIS — E291 Testicular hypofunction: Secondary | ICD-10-CM | POA: Diagnosis not present

## 2020-12-31 DIAGNOSIS — H43811 Vitreous degeneration, right eye: Secondary | ICD-10-CM | POA: Diagnosis not present

## 2021-01-04 DIAGNOSIS — E291 Testicular hypofunction: Secondary | ICD-10-CM | POA: Diagnosis not present

## 2021-01-10 DIAGNOSIS — Z85828 Personal history of other malignant neoplasm of skin: Secondary | ICD-10-CM | POA: Diagnosis not present

## 2021-01-10 DIAGNOSIS — D235 Other benign neoplasm of skin of trunk: Secondary | ICD-10-CM | POA: Diagnosis not present

## 2021-01-10 DIAGNOSIS — D225 Melanocytic nevi of trunk: Secondary | ICD-10-CM | POA: Diagnosis not present

## 2021-01-10 DIAGNOSIS — L578 Other skin changes due to chronic exposure to nonionizing radiation: Secondary | ICD-10-CM | POA: Diagnosis not present

## 2021-01-10 DIAGNOSIS — I781 Nevus, non-neoplastic: Secondary | ICD-10-CM | POA: Diagnosis not present

## 2021-01-10 DIAGNOSIS — D485 Neoplasm of uncertain behavior of skin: Secondary | ICD-10-CM | POA: Diagnosis not present

## 2021-01-17 DIAGNOSIS — E291 Testicular hypofunction: Secondary | ICD-10-CM | POA: Diagnosis not present

## 2021-01-21 ENCOUNTER — Ambulatory Visit (INDEPENDENT_AMBULATORY_CARE_PROVIDER_SITE_OTHER): Payer: BC Managed Care – PPO

## 2021-01-21 ENCOUNTER — Other Ambulatory Visit: Payer: Self-pay

## 2021-01-21 DIAGNOSIS — Z23 Encounter for immunization: Secondary | ICD-10-CM | POA: Diagnosis not present

## 2021-01-28 ENCOUNTER — Other Ambulatory Visit: Payer: Self-pay | Admitting: Infectious Diseases

## 2021-01-28 DIAGNOSIS — B20 Human immunodeficiency virus [HIV] disease: Secondary | ICD-10-CM

## 2021-01-28 NOTE — Addendum Note (Signed)
Addended by: Caffie Pinto on: 01/28/2021 09:13 AM   Modules accepted: Orders

## 2021-01-28 NOTE — Addendum Note (Signed)
Addended by: Caffie Pinto on: 01/28/2021 09:14 AM   Modules accepted: Orders

## 2021-01-30 ENCOUNTER — Other Ambulatory Visit: Payer: BC Managed Care – PPO

## 2021-01-30 ENCOUNTER — Other Ambulatory Visit: Payer: Self-pay

## 2021-01-30 ENCOUNTER — Other Ambulatory Visit (HOSPITAL_COMMUNITY)
Admission: RE | Admit: 2021-01-30 | Discharge: 2021-01-30 | Disposition: A | Discharge status: Home / Self Care | Payer: BC Managed Care – PPO | Source: Ambulatory Visit | Attending: Infectious Diseases | Admitting: Infectious Diseases

## 2021-01-30 DIAGNOSIS — E118 Type 2 diabetes mellitus with unspecified complications: Secondary | ICD-10-CM | POA: Diagnosis not present

## 2021-01-30 DIAGNOSIS — Z79899 Other long term (current) drug therapy: Secondary | ICD-10-CM

## 2021-01-30 DIAGNOSIS — Z113 Encounter for screening for infections with a predominantly sexual mode of transmission: Secondary | ICD-10-CM

## 2021-01-30 DIAGNOSIS — B20 Human immunodeficiency virus [HIV] disease: Secondary | ICD-10-CM | POA: Diagnosis not present

## 2021-01-30 DIAGNOSIS — E291 Testicular hypofunction: Secondary | ICD-10-CM | POA: Diagnosis not present

## 2021-02-01 LAB — COMPREHENSIVE METABOLIC PANEL
AG Ratio: 1.6 (calc) (ref 1.0–2.5)
ALT: 14 U/L (ref 9–46)
AST: 18 U/L (ref 10–35)
Albumin: 4.4 g/dL (ref 3.6–5.1)
Alkaline phosphatase (APISO): 83 U/L (ref 35–144)
BUN/Creatinine Ratio: 15 (calc) (ref 6–22)
BUN: 23 mg/dL (ref 7–25)
CO2: 25 mmol/L (ref 20–32)
Calcium: 9.8 mg/dL (ref 8.6–10.3)
Chloride: 105 mmol/L (ref 98–110)
Creat: 1.49 mg/dL — ABNORMAL HIGH (ref 0.70–1.35)
Globulin: 2.8 g/dL (calc) (ref 1.9–3.7)
Glucose, Bld: 85 mg/dL (ref 65–99)
Potassium: 4 mmol/L (ref 3.5–5.3)
Sodium: 141 mmol/L (ref 135–146)
Total Bilirubin: 3.5 mg/dL — ABNORMAL HIGH (ref 0.2–1.2)
Total Protein: 7.2 g/dL (ref 6.1–8.1)

## 2021-02-01 LAB — LIPID PANEL
Cholesterol: 84 mg/dL (ref <200)
HDL: 32 mg/dL — ABNORMAL LOW (ref >=40)
LDL Cholesterol (Calc): 23 mg/dL (calc)
Non-HDL Cholesterol (Calc): 52 mg/dL (calc) (ref <130)
Total CHOL/HDL Ratio: 2.6 (calc) (ref <5.0)
Triglycerides: 250 mg/dL — ABNORMAL HIGH (ref <150)

## 2021-02-01 LAB — CBC
HCT: 52.7 % — ABNORMAL HIGH (ref 38.5–50.0)
Hemoglobin: 18.5 g/dL — ABNORMAL HIGH (ref 13.2–17.1)
MCH: 33.5 pg — ABNORMAL HIGH (ref 27.0–33.0)
MCHC: 35.1 g/dL (ref 32.0–36.0)
MCV: 95.5 fL (ref 80.0–100.0)
MPV: 10.6 fL (ref 7.5–12.5)
Platelets: 184 10*3/uL (ref 140–400)
RBC: 5.52 10*6/uL (ref 4.20–5.80)
RDW: 13.4 % (ref 11.0–15.0)
WBC: 9.2 10*3/uL (ref 3.8–10.8)

## 2021-02-01 LAB — URINE CYTOLOGY ANCILLARY ONLY
Chlamydia: NEGATIVE
Comment: NEGATIVE
Comment: NORMAL
Neisseria Gonorrhea: NEGATIVE

## 2021-02-01 LAB — RPR: RPR Ser Ql: NONREACTIVE

## 2021-02-01 LAB — HIV-1 RNA QUANT-NO REFLEX-BLD
HIV 1 RNA Quant: NOT DETECTED Copies/mL
HIV-1 RNA Quant, Log: NOT DETECTED Log cps/mL

## 2021-02-01 LAB — T-HELPER CELLS (CD4) COUNT (NOT AT ARMC)
Absolute CD4: 443 cells/uL — ABNORMAL LOW (ref 490–1740)
CD4 T Helper %: 14 % — ABNORMAL LOW (ref 30–61)
Total lymphocyte count: 3228 cells/uL (ref 850–3900)

## 2021-02-09 IMAGING — DX DG FOOT COMPLETE 3+V*R*
3 series · 3 of 3 positions shown · non-contrast
Comparison: None.

CLINICAL DATA: Right foot pain

EXAM:
RIGHT FOOT COMPLETE - 3+ VIEW

[foot ap]
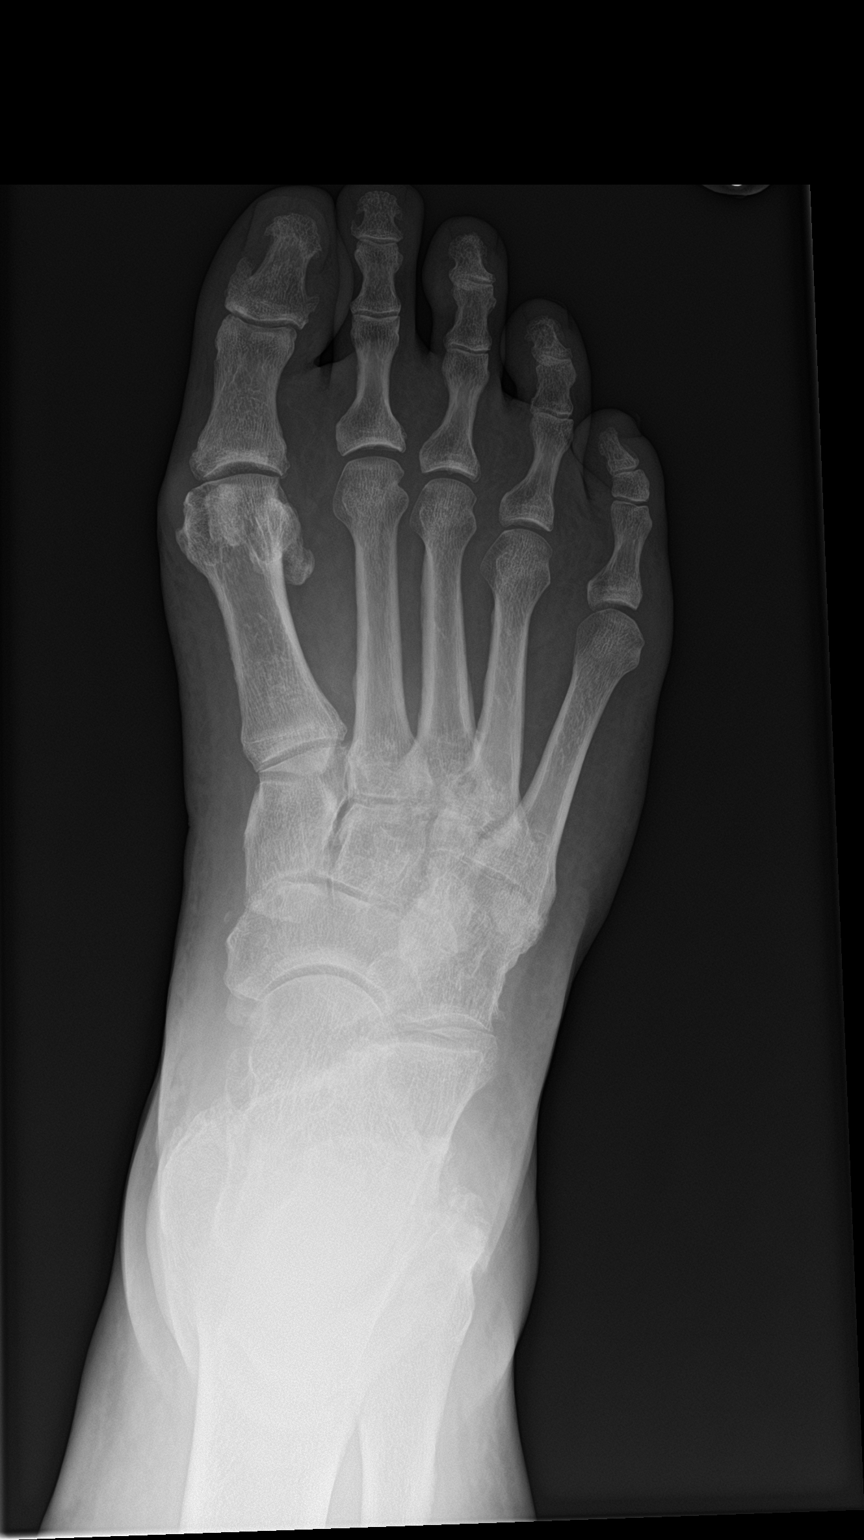

[foot obl]
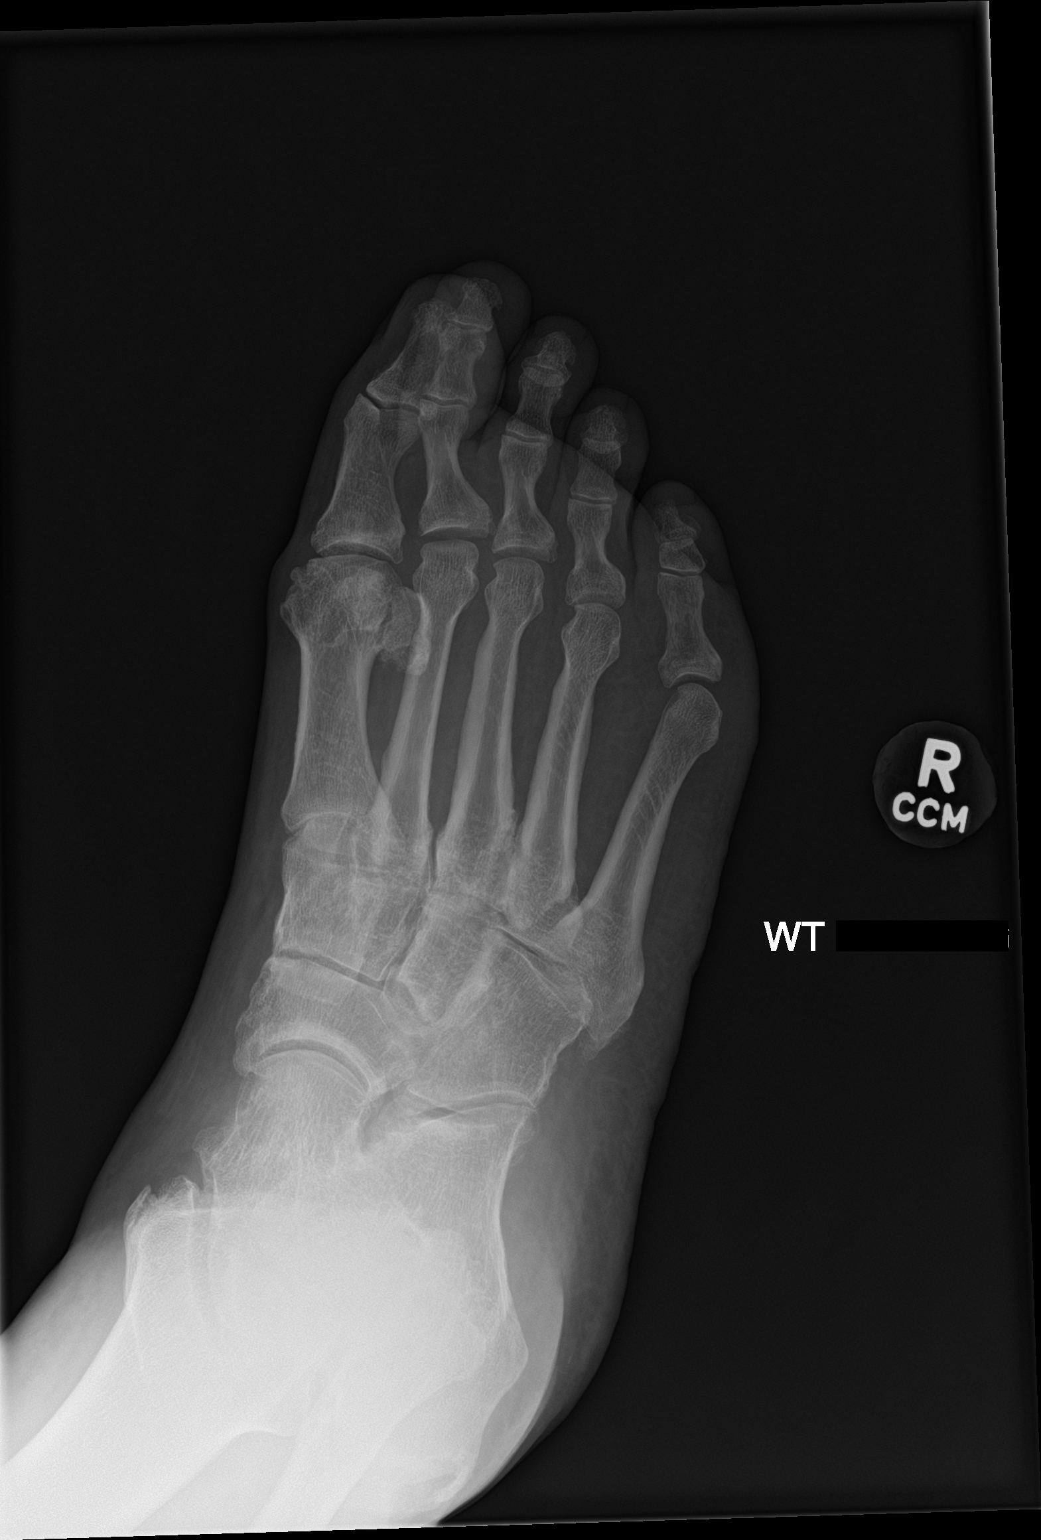

[foot lat]
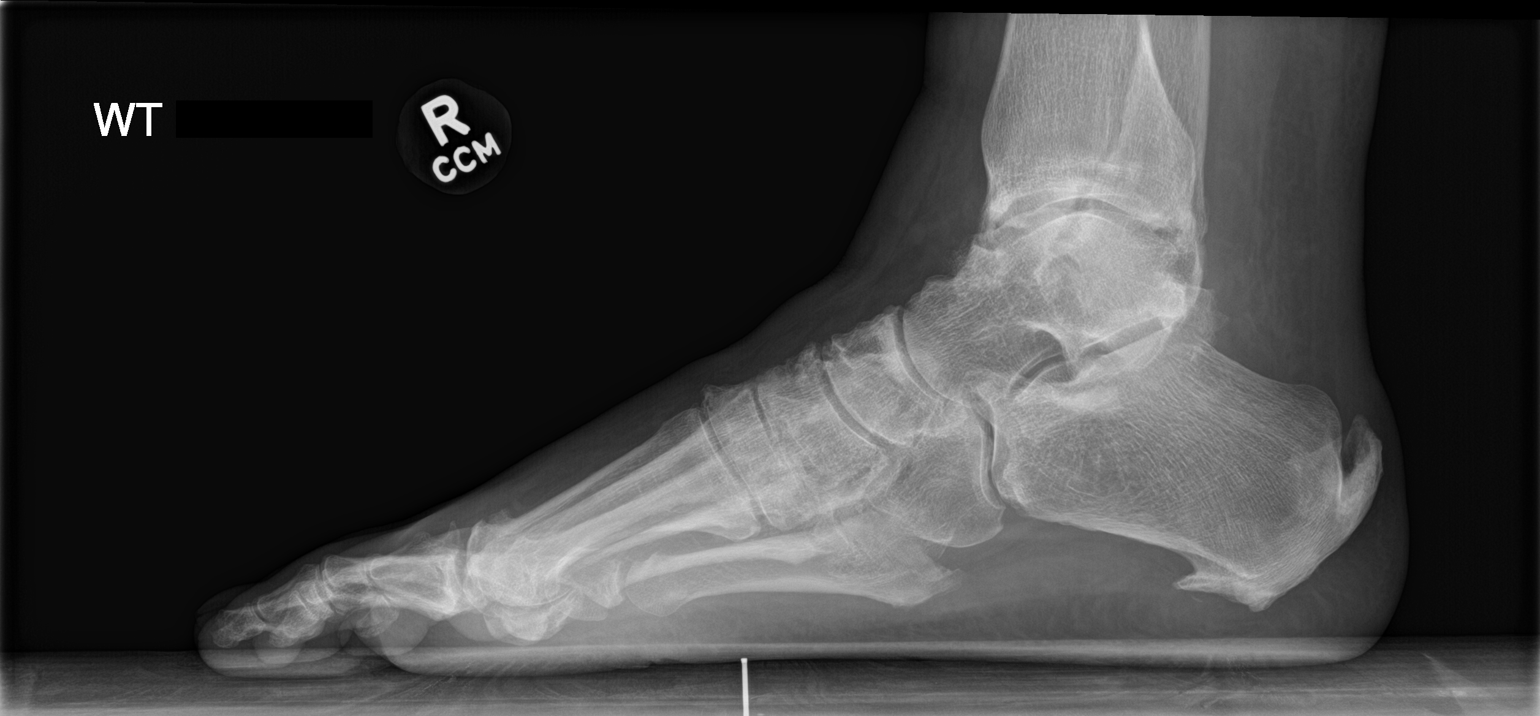

[3 of 3 positions shown; findings below may reference images not displayed]

FINDINGS: No fracture or malalignment. Large plantar calcaneal spur.
Degenerative changes at the first MTP joint.
IMPRESSION: No acute osseous abnormality.  Large plantar calcaneal spur

## 2021-02-11 DIAGNOSIS — H43811 Vitreous degeneration, right eye: Secondary | ICD-10-CM | POA: Diagnosis not present

## 2021-02-14 ENCOUNTER — Ambulatory Visit: Payer: BC Managed Care – PPO | Admitting: Infectious Diseases

## 2021-02-15 DIAGNOSIS — E291 Testicular hypofunction: Secondary | ICD-10-CM | POA: Diagnosis not present

## 2021-02-19 ENCOUNTER — Ambulatory Visit: Payer: BC Managed Care – PPO | Admitting: Infectious Diseases

## 2021-02-19 ENCOUNTER — Encounter: Payer: Self-pay | Admitting: Infectious Diseases

## 2021-02-19 ENCOUNTER — Other Ambulatory Visit: Payer: Self-pay

## 2021-02-19 VITALS — BP 164/66 | HR 67 | Temp 98.1°F | Resp 16 | Wt 228.0 lb

## 2021-02-19 DIAGNOSIS — E118 Type 2 diabetes mellitus with unspecified complications: Secondary | ICD-10-CM

## 2021-02-19 DIAGNOSIS — Z79899 Other long term (current) drug therapy: Secondary | ICD-10-CM

## 2021-02-19 DIAGNOSIS — I10 Essential (primary) hypertension: Secondary | ICD-10-CM

## 2021-02-19 DIAGNOSIS — Z113 Encounter for screening for infections with a predominantly sexual mode of transmission: Secondary | ICD-10-CM

## 2021-02-19 DIAGNOSIS — B20 Human immunodeficiency virus [HIV] disease: Secondary | ICD-10-CM

## 2021-02-19 NOTE — Progress Notes (Signed)
° °  Subjective:    Patient ID: Joshua Herren., male  DOB: 1950-10-23, 70 y.o.        MRN: [id number]   HPI 70 yo M with hx of hemochromatosis, DM2 (dx 2007), secondary hypogonadism (on clomid), CKD3, hyperlipidemia, lumbar laminectomy/decompression (04-2011), and HIV+ since 1990 when he was dx on insurance physical. He has been maintained on atripla --> changed to [facility] 2017.    He is completely COVID vaccinated. He had cataract surgery in Jan and he is glasses free for first time in 50 yrs.    He has been doing well with his DM- last A1C was 5.9 (has been as low as 5.3 over last year). Back on metformin. Gives him significant GI upset. Sees Buddy Duty.  No problems with odefsey, takes after at HS.  Sees, ophtho (has floater), podiatry, renal,  yearly. Monitors BP at home, have been nl  HIV 1 RNA Quant  Date Value  01/30/2021 Not Detected Copies/mL  05/01/2020 <20 Copies/mL  05/19/2019 <20 NOT DETECTED copies/mL   CD4 T Cell Abs (/uL)  Date Value  05/01/2020 549  05/19/2019 858  04/15/2018 600     Health Maintenance  Topic Date Due   HEMOGLOBIN A1C  09/28/2017   OPHTHALMOLOGY EXAM  02/07/2018   COVID-19 Vaccine (4 - Booster for Moderna series) 12/31/2019   INFLUENZA VACCINE  10/08/2020   FOOT EXAM  07/24/2021   COLONOSCOPY (Pts 45-65yrs Insurance coverage will need to be confirmed)  05/28/2026   TETANUS/TDAP  04/16/2027   Pneumonia Vaccine 33+ Years old  Completed   Hepatitis C Screening  Completed   Zoster Vaccines- Shingrix  Completed   HPV VACCINES  Aged Out      Review of Systems  Constitutional:  Negative for chills and fever.  Respiratory:  Negative for cough and shortness of breath.   Gastrointestinal:  Positive for diarrhea (with metformin). Negative for constipation.  Genitourinary:  Negative for dysuria.  Neurological:  Positive for sensory change. Negative for headaches.   Please see HPI. All other systems reviewed and negative.     Objective:   Physical Exam Vitals reviewed.  Constitutional:      Appearance: Normal appearance. He is obese.  HENT:     Mouth/Throat:     Mouth: Mucous membranes are moist.     Pharynx: No oropharyngeal exudate.  Eyes:     Extraocular Movements: Extraocular movements intact.     Pupils: Pupils are equal, round, and reactive to light.  Cardiovascular:     Rate and Rhythm: Normal rate and regular rhythm.  Pulmonary:     Effort: Pulmonary effort is normal.     Breath sounds: Normal breath sounds.  Abdominal:     General: Bowel sounds are normal. There is no distension.     Palpations: Abdomen is soft.     Tenderness: There is no abdominal tenderness.  Musculoskeletal:        General: No swelling. Normal range of motion.     Cervical back: Normal range of motion and neck supple.  Neurological:     General: No focal deficit present.     Mental Status: He is alert.          Assessment & Plan:

## 2021-02-19 NOTE — Assessment & Plan Note (Addendum)
He is doing very well and my great appreciation to the excellent providers who see him (Dr Radiation protection practitioner).  We discussed alternatives to Metformin, he will f/u with Dr Buddy Duty regarding this.

## 2021-02-19 NOTE — Assessment & Plan Note (Signed)
He attributes his BP today to rushing to get here and traffic. Home BP monitoring has been good.

## 2021-02-19 NOTE — Assessment & Plan Note (Signed)
He is doing very well on odefsy. Could argue to modernize his rx but he is undetectable.  We discussed his labs, CD4 and %. There are no causes for concern.  His vax are up todate.  Will aim to see him back in 9 months.

## 2021-03-01 DIAGNOSIS — E291 Testicular hypofunction: Secondary | ICD-10-CM | POA: Diagnosis not present

## 2021-03-01 DIAGNOSIS — R350 Frequency of micturition: Secondary | ICD-10-CM | POA: Diagnosis not present

## 2021-03-15 DIAGNOSIS — E291 Testicular hypofunction: Secondary | ICD-10-CM | POA: Diagnosis not present

## 2021-03-25 ENCOUNTER — Telehealth: Payer: Self-pay | Admitting: Student in an Organized Health Care Education/Training Program

## 2021-03-25 NOTE — Telephone Encounter (Signed)
The patient called regarding feeling some shortness of breath and for having a pulse of 46 bpm. He states that he has had URI type symptoms over the course of the day and has generally felt unwell. When he lays down flat, he feels like he can't get a "full breath in". He checked his HR given that he has known Mobitz type 1 and noted that it was in the 40s. He states that it is usually in the 1s. He also checked his pulse ox which was 95% on RA. He denies other symptoms. He denies chest pain, syncope, presyncope, palpitations, lightheadedness and DOE. He feels fine apart from having the sensation of not being able to take a full breath in when laying flat. Based on the patient's symptoms, he is not giving a compelling story for symptomatic bradycardia. His normal O2 sat is also reassuring for a primary lung pathology. I wonder if his nasal congestion from his URI syndrome is driving his breathing complaint. I instructed the patient to do something exertional (like jumping jacks) then check his HR. If his HR does not increase >60 bpm or he becomes symptomatic, I told him to call me back. If none of these occur, then I think it is fine for him to be evaluated by his PCP or at Urgent care in the morning. I gave him counseling on what symptoms that would warrant ED evaluation.

## 2021-03-29 DIAGNOSIS — E1129 Type 2 diabetes mellitus with other diabetic kidney complication: Secondary | ICD-10-CM | POA: Diagnosis not present

## 2021-03-29 DIAGNOSIS — N179 Acute kidney failure, unspecified: Secondary | ICD-10-CM | POA: Diagnosis not present

## 2021-03-29 DIAGNOSIS — E291 Testicular hypofunction: Secondary | ICD-10-CM | POA: Diagnosis not present

## 2021-03-29 DIAGNOSIS — R809 Proteinuria, unspecified: Secondary | ICD-10-CM | POA: Diagnosis not present

## 2021-03-29 DIAGNOSIS — I129 Hypertensive chronic kidney disease with stage 1 through stage 4 chronic kidney disease, or unspecified chronic kidney disease: Secondary | ICD-10-CM | POA: Diagnosis not present

## 2021-03-29 DIAGNOSIS — N182 Chronic kidney disease, stage 2 (mild): Secondary | ICD-10-CM | POA: Diagnosis not present

## 2021-04-01 NOTE — Progress Notes (Signed)
Cardiology Office Note Date:  04/02/2021  Patient ID:  Bayan Kushnir., DOB 1951/01/04, MRN [id number] PCP:  [name], MD  Cardiologist:   patient prefers to stay with [facility] ONLY Electrophysiologist: Dr. Curt Bears    Chief Complaint: slow heart rate  History of Present Illness: Phoenix Dresser. is a 71 y.o. male with history of HTN, HLD, DM, HIV, hemochromatosis, CKD (III), back pain w/hx of prior surgeries.  He was referred to Dr. Curt Bears Dec 2021 for Mobitz one heart block, the only visit he has had.  Seeing Dr. Virgina Jock prior to that. AT the time of his visit with Dr. Curt Bears, he did not formally exercise, though reported no exertional intolerances, or symptoms of bradycardia Was previously on coreg, though off it Mobitz one persisted. Echo did not show evidence of his hemochromatosis With no symptoms, not planned for any formal monitoring  Pt called 03/25/21, concerns of some nocturnal symptom/perhaps SOB and noted HR in the 40's, covering MD , perhaps some URI symptoms of late thought not overwhelming in that regard. Recommended to do some physical axertion and check his HR, if this made him feel worse, or if HR did not rise should see attention.  TODAY He says when he called he was feeling a little congested, mostly sinus congestion, head fullness, a little SOB and checked his pulse Ox to see his Oxygen level, that was ok 95% or so but more notably his HR was in the 40's and very unusual for him. He was eventually started on Augmentin by his PMD (still taking) and his sinus congestion/symptoms are improving.  He has been following his HR daily consistently since though his HRs remain in the 40's. He did try some exercise as was instructed and did see that his R did go up some.  He has had a very momentary  low left chest ache in the past, this is fleeting and unassociated with his slower rates or any other symptoms  He has less energy then usual in that at the  end of the day he is more tired then usual, but not having any particular exertional intolerances.  Chronically when he get sup in the night to urinate he will sometimes feel fleetingly lightheaded, no dizziness otherwise, no near syncope or syncope.  His nephrologist had him stop the Norvasc with reports of slow HRs last week, also had labs  03/29/21 WBC 8.4 H/H 17/49 Plts 181 K+ 4.2 BUN/Creat 18/1.28   Past Medical History:  Diagnosis Date   Complication of anesthesia    ANESTHESIA AWARENESS  DURING ADRENALECTOMY AND COLONOSCOPY   Diabetes mellitus    on oral meds-no insulin   DIABETES MELLITUS, TYPE II, UNCONTROLLED 06/22/2006   GILBERT'S SYNDROME 02/25/2007   GOUT 06/22/2006   HEMOCHROMATOSIS, HX OF 12/15/2005   phlebot[facility] every 6 to 8 weeks--[facility]   HIV disease ([place]) 1990   [facility] [place] [facility]   HNP (herniated nucleus pulposus)    LUMBAR PAIN WITH PAIN DOWN LEFT LEG TO TOES-SOME NUMBNESS AND TINGLING IN BIG TOE   Hypertension     Past Surgical History:  Procedure Laterality Date   ADRENALECTOMY     RIGHT ADRENALECTOMY    CHOLECYSTECTOMY     LUMBAR LAMINECTOMY/DECOMPRESSION MICRODISCECTOMY  04/23/2011   Procedure: LUMBAR LAMINECTOMY/DECOMPRESSION MICRODISCECTOMY;  Surgeon: Johnn Hai, MD;  Location: [facility];  Service: Orthopedics;  Laterality: N/A;  Decompression L5 S1 (X-Ray)   ORIF LEFT  SHOULDER     STILL HAS HARDWARE IN THE SHOULDER    Current Outpatient Medications  Medication Sig Dispense Refill   allopurinol (ZYLOPRIM) 300 MG tablet Take 300 mg by mouth daily.     atorvastatin (LIPITOR) 10 MG tablet Take 10 mg by mouth at bedtime.     BD PEN NEEDLE NANO 2ND GEN 32G X 4 MM MISC Inject into the skin daily.     cetirizine-pseudoephedrine (ZYRTEC-D) 5-120 MG tablet Take by mouth.     colchicine 0.6 MG tablet Take 0.6 mg by mouth as needed (gout).      dextromethorphan-guaiFENesin (MUCINEX DM) 30-600 MG 12hr  tablet Take 1 tablet by mouth every 12 (twelve) hours.     emtricitabine-rilpivir-tenofovir AF (ODEFSEY) 200-25-25 MG TABS tablet Take 1 tablet by mouth daily with breakfast. 90 tablet 4   FARXIGA 10 MG TABS tablet Take 10 mg by mouth daily.     furosemide (LASIX) 20 MG tablet 1 tablet     gabapentin (NEURONTIN) 300 MG capsule TAKE ONE CAPSULE BY MOUTH TWICE DAILY (Patient taking differently: Take 300 mg by mouth 3 (three) times daily.) 30 capsule 1   glucose blood (ONETOUCH VERIO) test strip use to test three times daily as needed     hydrALAZINE (APRESOLINE) 25 MG tablet Take 25 mg by mouth 3 (three) times daily. Patient states that he is taken 50 mg     Icosapent Ethyl (VASCEPA PO) Take 1 tablet by mouth 2 (two) times daily.      ketoconazole (NIZORAL) 2 % cream Apply 1 application topically daily. 60 g 0   metFORMIN (GLUCOPHAGE) 1000 MG tablet Take 1 tablet by mouth 2 (two) times daily with a meal.     sildenafil (REVATIO) 20 MG tablet Take 1 tablet by mouth daily.     SOLIQUA 100-33 UNT-MCG/ML SOPN Injects 60 units daily  12   tadalafil (CIALIS) 20 MG tablet Take 20 mg by mouth daily as needed for erectile dysfunction.     telmisartan (MICARDIS) 80 MG tablet Take 80 mg by mouth daily before breakfast.     testosterone cypionate (DEPOTESTOSTERONE CYPIONATE) 200 MG/ML injection Inject 200 mg into the muscle every 14 (fourteen) days.     vitamin B-12 (CYANOCOBALAMIN) 1000 MCG tablet Take 1,000 mcg by mouth daily.     No current facility-administered medications for this visit.    Allergies:   Scopolamine, Codeine, Dilaudid [hydromorphone], and Meperidine hcl   Social History:  The patient  reports that he has never smoked. He has never used smokeless tobacco. He reports that he does not drink alcohol and does not use drugs.   Family History:  The patient's family history includes Atrial fibrillation in his father and mother; Hyperlipidemia in his father and mother; Hypertension in his  father and mother.  ROS:  Please see the history of present illness.    All other systems are reviewed and otherwise negative.   PHYSICAL EXAM:  VS:  BP 130/84    Pulse (!) 47    Ht 5\' 8"  (1.727 m)    Wt 226 lb 3.2 oz (102.6 kg)    SpO2 99%    BMI 34.39 kg/m  BMI: Body mass index is 34.39 kg/m. Well nourished, well developed, in no acute distress HEENT: normocephalic, atraumatic Neck: no JVD, carotid bruits or masses Cardiac:   RRR; bradycardic, no significant murmurs, no rubs, or gallops Lungs:  CTA b/l, no wheezing, rhonchi or rales Abd: soft, nontender MS: no deformity  or atrophy Ext: trace-1+ edema Skin: warm and dry, no rash Neuro:  No gross deficits appreciated Psych: euthymic mood, full affect   EKG:  Done today and reviewed by myself shows  2:1 AVBlock, 47bpm, QRS 79ms  Rhythm strip with seated exercising SR with Mobitz I Ventricular rate 60's   Echocardiogram 11/25/2019:  Normal LV systolic function with EF 71%. Left ventricle cavity is normal  in size. Normal left ventricular wall thickness. Normal global wall  motion. Patient appeared to be in Wenckebach heart block by EKG during  eam. Hence diastolic function not performed. LVEDP appears to be elevated  by E/e'. Calculated EF 71%.  Structurally normal mitral valve.  Mild (Grade I) mitral regurgitation.  E-wave dominant mitral inflow.  Structurally normal tricuspid valve.  Mild tricuspid regurgitation. No  evidence of pulmonary hypertension.   12/30/2015; TTE Study Conclusions  - Left ventricle: The cavity size was normal. Wall thickness was    normal. Systolic function was normal. The estimated ejection    fraction was in the range of 60% to 65%. Wall motion was normal;    there were no regional wall motion abnormalities. Left    ventricular diastolic function parameters were normal.  - Aortic valve: Valve area (VTI): 2.42 cm^2. Valve area (Vmax):    2.57 cm^2. Valve area (Vmean): 2.49 cm^2.  - Atrial  septum: No defect or patent foramen ovale was identified.  - Pulmonary arteries: Systolic pressure was mildly increased. PA    peak pressure: 36 mm Hg (S).  - Technically adequate study.   Coronary angiogram 2010:  1. No obstructive coronary artery disease.  2. Abnormal filling pattern in both the right and left coronary      distribution.  3. Normal left ventricular systolic function.     Recent Labs: 01/30/2021: ALT 14; BUN 23; Creat 1.49; Hemoglobin 18.5; Platelets 184; Potassium 4.0; Sodium 141  01/30/2021: Cholesterol 84; HDL 32; LDL Cholesterol (Calc) 23; Total CHOL/HDL Ratio 2.6; Triglycerides 250   CrCl cannot be calculated (Patient's most recent lab result is older than the maximum 21 days allowed.).   Wt Readings from Last 3 Encounters:  04/02/21 226 lb 3.2 oz (102.6 kg)  02/19/21 228 lb (103.4 kg)  05/15/20 232 lb 11.2 oz (105.6 kg)     Other studies reviewed: Additional studies/records reviewed today include: summarized above  ASSESSMENT AND PLAN:  Baseline conduction system disease Has developed 2:1 AVblock, narrow QRS and minimal (if any symptoms) Conduction improves with minimal exertion today  I think he will need pacing I discussed with Dr. Lovena Le, agrees, will need pacing though no urgency, recommends that he see Dr. Curt Bears next week and discuss further  I discussed findings and likely need for PPM, and he is agreeable if Dr. Curt Bears agrees, is happy to see him next week and would want Dr. Curt Bears to be the implanting doctor I discussed symptoms to be aware of and seek attention for.  He is not on any nodal blocking agents BP stable  HTN Looks ok   Disposition: F/u with Dr. Curt Bears next week  Current medicines are reviewed at length with the patient today.  The patient did not have any concerns regarding medicines.  Venetia Night, PA-C 04/02/2021 11:53 AM     [facility] [place] [address] [phone] (office)  [phone] (fax)

## 2021-04-02 ENCOUNTER — Ambulatory Visit: Payer: BC Managed Care – PPO | Admitting: Physician Assistant

## 2021-04-02 ENCOUNTER — Encounter: Payer: Self-pay | Admitting: Physician Assistant

## 2021-04-02 ENCOUNTER — Other Ambulatory Visit: Payer: Self-pay

## 2021-04-02 VITALS — BP 130/84 | HR 47 | Ht 68.0 in | Wt 226.2 lb

## 2021-04-02 DIAGNOSIS — I441 Atrioventricular block, second degree: Secondary | ICD-10-CM

## 2021-04-02 DIAGNOSIS — I10 Essential (primary) hypertension: Secondary | ICD-10-CM

## 2021-04-02 NOTE — Patient Instructions (Addendum)
Medication Instructions:   Your physician recommends that you continue on your current medications as directed. Please refer to the Current Medication list given to you today.   *If you need a refill on your cardiac medications before your next appointment, please call your pharmacy*   Lab Work: [place]    If you have labs (blood work) drawn today and your tests are completely normal, you will receive your results only by: [place] (if you have MyChart) OR A paper copy in the mail If you have any lab test that is abnormal or we need to change your treatment, we will call you to review the results.   Testing/Procedures:NONE ORDERED  TODAY     Follow-Up: At [facility], you and your health needs are our priority.  As part of our continuing mission to provide you with exceptional heart care, we have created designated Provider Care Teams.  These Care Teams include your primary Cardiologist (physician) and Advanced Practice Providers (APPs -  Physician Assistants and Nurse Practitioners) who all work together to provide you with the care you need, when you need it.  We recommend signing up for the patient portal called "MyChart".  Sign up information is provided on this After Visit Summary.  MyChart is used to connect with patients for Virtual Visits (Telemedicine).  Patients are able to view lab/test results, encounter notes, upcoming appointments, etc.  Non-urgent messages can be sent to your provider as well.   To learn more about what you can do with MyChart, go to [email or web address].    Your next appointment:   AS SCHEDULED   The format for your next appointment:   In Person  Provider:   Allegra Lai, MD    Other Instructions

## 2021-04-07 DIAGNOSIS — R053 Chronic cough: Secondary | ICD-10-CM | POA: Diagnosis not present

## 2021-04-09 ENCOUNTER — Other Ambulatory Visit: Payer: Self-pay

## 2021-04-09 ENCOUNTER — Ambulatory Visit: Payer: BC Managed Care – PPO | Admitting: Student

## 2021-04-09 ENCOUNTER — Ambulatory Visit: Payer: BC Managed Care – PPO | Admitting: Cardiology

## 2021-04-09 ENCOUNTER — Encounter: Payer: Self-pay | Admitting: Cardiology

## 2021-04-09 VITALS — BP 192/76 | HR 82 | Ht 68.0 in | Wt 221.8 lb

## 2021-04-09 DIAGNOSIS — I441 Atrioventricular block, second degree: Secondary | ICD-10-CM | POA: Diagnosis not present

## 2021-04-09 DIAGNOSIS — Z01812 Encounter for preprocedural laboratory examination: Secondary | ICD-10-CM | POA: Diagnosis not present

## 2021-04-09 DIAGNOSIS — Z01818 Encounter for other preprocedural examination: Secondary | ICD-10-CM | POA: Diagnosis not present

## 2021-04-09 MED ORDER — AMLODIPINE BESYLATE 5 MG PO TABS: 5.0000 mg | ORAL_TABLET | Freq: Every day | ORAL | 2 refills | Status: AC

## 2021-04-09 NOTE — H&P (View-Only) (Signed)
Electrophysiology Office Note   Date:  04/09/2021   ID:  [name]., DOB 04-15-1950, MRN [id number]  PCP:  [name], MD  Cardiologist:  Virgina Jock Primary Electrophysiologist:  Ervine Witucki Meredith Leeds, MD    Chief Complaint: Mobitz 1 AV block   History of Present Illness: Joshua Murty. is a 71 y.o. male who is being seen today for the evaluation of Mobitz 1 AV block at the request of [name]. Presenting today for electrophysiology evaluation.    He has a history significant for hypertension, hyperlipidemia, insulin-dependent diabetes, HIV, hemochromatosis.  He had a prior ECG done that showed Mobitz 1 AV block.  Echo showed an no evidence of hemochromatosis.  He represented to clinic with 2-1 AV block feeling weak and fatigued.  He is in Mobitz 1 AV block today and feeling well.  He states that his heart rate is slow he feels quite poorly with fatigue and shortness of breath.  Today, denies symptoms of palpitations, chest pain, shortness of breath, orthopnea, PND, lower extremity edema, claudication, dizziness, presyncope, syncope, bleeding, or neurologic sequela. The patient is tolerating medications without difficulties.     Past Medical History:  Diagnosis Date   Complication of anesthesia    ANESTHESIA AWARENESS  DURING ADRENALECTOMY AND COLONOSCOPY   Diabetes mellitus    on oral meds-no insulin   DIABETES MELLITUS, TYPE II, UNCONTROLLED 06/22/2006   GILBERT'S SYNDROME 02/25/2007   GOUT 06/22/2006   HEMOCHROMATOSIS, HX OF 12/15/2005   phlebotomy every 6 to 8 weeks--[facility]   HIV disease ([place]) 1990   [facility] [place] [facility]   HNP (herniated nucleus pulposus)    LUMBAR PAIN WITH PAIN DOWN LEFT LEG TO TOES-SOME NUMBNESS AND TINGLING IN BIG TOE   Hypertension    Past Surgical History:  Procedure Laterality Date   ADRENALECTOMY     RIGHT ADRENALECTOMY    CHOLECYSTECTOMY     LUMBAR LAMINECTOMY/DECOMPRESSION  MICRODISCECTOMY  04/23/2011   Procedure: LUMBAR LAMINECTOMY/DECOMPRESSION MICRODISCECTOMY;  Surgeon: Johnn Hai, MD;  Location: [facility];  Service: Orthopedics;  Laterality: N/A;  Decompression L5 S1 (X-Ray)   ORIF LEFT SHOULDER     STILL HAS HARDWARE IN THE SHOULDER     Current Outpatient Medications  Medication Sig Dispense Refill   allopurinol (ZYLOPRIM) 300 MG tablet Take 300 mg by mouth daily.     amLODipine (NORVASC) 5 MG tablet Take 1 tablet (5 mg total) by mouth daily. 90 tablet 2   atorvastatin (LIPITOR) 10 MG tablet Take 10 mg by mouth at bedtime.     BD PEN NEEDLE NANO 2ND GEN 32G X 4 MM MISC Inject into the skin daily.     cetirizine-pseudoephedrine (ZYRTEC-D) 5-120 MG tablet Take by mouth.     colchicine 0.6 MG tablet Take 0.6 mg by mouth as needed (gout).      dextromethorphan-guaiFENesin (MUCINEX DM) 30-600 MG 12hr tablet Take 1 tablet by mouth every 12 (twelve) hours.     emtricitabine-rilpivir-tenofovir AF (ODEFSEY) 200-25-25 MG TABS tablet Take 1 tablet by mouth daily with breakfast. 90 tablet 4   FARXIGA 10 MG TABS tablet Take 10 mg by mouth daily.     furosemide (LASIX) 20 MG tablet 1 tablet     gabapentin (NEURONTIN) 300 MG capsule TAKE ONE CAPSULE BY MOUTH TWICE DAILY (Patient taking differently: Take 300 mg by mouth 2 (two) times daily.) 30 capsule 1   glucose blood (ONETOUCH VERIO) test strip use to  test three times daily as needed     hydrALAZINE (APRESOLINE) 25 MG tablet Take 25 mg by mouth 3 (three) times daily. Patient states that he is taken 50 mg     Icosapent Ethyl (VASCEPA PO) Take 1 tablet by mouth 2 (two) times daily.      ketoconazole (NIZORAL) 2 % cream Apply 1 application topically daily. 60 g 0   metFORMIN (GLUCOPHAGE) 1000 MG tablet Take 1 tablet by mouth 2 (two) times daily with a meal.     sildenafil (REVATIO) 20 MG tablet Take 1 tablet by mouth daily.     SOLIQUA 100-33 UNT-MCG/ML SOPN Injects 60 units daily  12   tadalafil (CIALIS) 20 MG tablet  Take 20 mg by mouth daily as needed for erectile dysfunction.     telmisartan (MICARDIS) 80 MG tablet Take 80 mg by mouth daily before breakfast.     testosterone cypionate (DEPOTESTOSTERONE CYPIONATE) 200 MG/ML injection Inject 200 mg into the muscle every 14 (fourteen) days.     vitamin B-12 (CYANOCOBALAMIN) 1000 MCG tablet Take 1,000 mcg by mouth daily.     No current facility-administered medications for this visit.    Allergies:   Hydromorphone, Scopolamine, Beta adrenergic blockers, Escitalopram oxalate, Metformin hcl, Morphine sulfate, Codeine, and Meperidine hcl   Social History:  The patient  reports that he has never smoked. He has never used smokeless tobacco. He reports that he does not drink alcohol and does not use drugs.   Family History:  The patient's family history includes Atrial fibrillation in his father and mother; Hyperlipidemia in his father and mother; Hypertension in his father and mother.    ROS:  Please see the history of present illness.   Otherwise, review of systems is positive for none.   All other systems are reviewed and negative.   PHYSICAL EXAM: VS:  BP (!) 192/76    Pulse 82    Ht 5\' 8"  (1.727 m)    Wt 221 lb 12.8 oz (100.6 kg)    SpO2 94%    BMI 33.72 kg/m  , BMI Body mass index is 33.72 kg/m. GEN: Well nourished, well developed, in no acute distress  HEENT: normal  Neck: no JVD, carotid bruits, or masses Cardiac: RRR; no murmurs, rubs, or gallops,no edema  Respiratory:  clear to auscultation bilaterally, normal work of breathing GI: soft, nontender, nondistended, + BS MS: no deformity or atrophy  Skin: warm and dry Neuro:  Strength and sensation are intact Psych: euthymic mood, full affect  EKG:  EKG is ordered today. Personal review of the ekg ordered shows sinus rhythm, Mobitz 1 AV block  Recent Labs: 01/30/2021: ALT 14; BUN 23; Creat 1.49; Hemoglobin 18.5; Platelets 184; Potassium 4.0; Sodium 141    Lipid Panel     Component Value  Date/Time   CHOL 84 01/30/2021 0837   TRIG 250 (H) 01/30/2021 0837   HDL 32 (L) 01/30/2021 0837   CHOLHDL 2.6 01/30/2021 0837   VLDL NOT CALC 04/22/2016 1536   LDLCALC 23 01/30/2021 0837   LDLDIRECT 55.9 11/08/2010 0835     Wt Readings from Last 3 Encounters:  04/09/21 221 lb 12.8 oz (100.6 kg)  04/02/21 226 lb 3.2 oz (102.6 kg)  02/19/21 228 lb (103.4 kg)      Other studies Reviewed: Additional studies/ records that were reviewed today include: TTE 11/25/19  Review of the above records today demonstrates:  Normal LV systolic function with EF 71%. Left ventricle cavity is normal  in size. Normal left ventricular wall thickness. Normal global wall  motion. Patient appeared to be in Wenckebach heart block by EKG during  eam. Hence diastolic function not performed. LVEDP appears to be elevated  by E/e'. Calculated EF 71%.  Structurally normal mitral valve.  Mild (Grade I) mitral regurgitation.  E-wave dominant mitral inflow.  Structurally normal tricuspid valve.  Mild tricuspid regurgitation. No  evidence of pulmonary hypertension.   ASSESSMENT AND PLAN:  1.  Second-degree AV block: Has Mobitz 1 AV block as well as 2-1 AV block.  He feels weak and fatigued when his heart rate is slow.  Due to that, we Jeraldean Wechter plan for pacemaker implant.  Risks and benefits have been discussed which were bleeding, infection, tamponade, pneumothorax.  He understand these risks and is agreed to the procedure.  2.  Hyperlipidemia: Continue Vascepa and atorvastatin  3.  Hypertension: Blood pressure is elevated today.  We Juliann Olesky restart his amlodipine 5 mg.  Plan per primary physician and nephrology.   Current medicines are reviewed at length with the patient today.   The patient does not have concerns regarding his medicines.  The following changes were made today:  none  Labs/ tests ordered today include:  Orders Placed This Encounter  Procedures   Basic metabolic panel   CBC   EKG 12-Lead      Disposition:   FU with Marquese Burkland 3 months  Signed, Luisantonio Adinolfi Meredith Leeds, MD  04/09/2021 5:01 PM     [facility] [place] [address] [place] [address] [phone] (office) [phone] (fax)

## 2021-04-09 NOTE — Patient Instructions (Signed)
Medication Instructions:  Your physician recommends that you continue on your current medications as directed. Please refer to the Current Medication list given to you today.     * If you need a refill on your cardiac medications before your next appointment, please call your pharmacy. *   Labwork: Pre procedure lab work today: BMET & CBC   * Will notify you of abnormal results, otherwise continue current treatment plan.*   Testing/Procedures: Your physician has recommended that you have a pacemaker inserted. A pacemaker is a small device that is placed under the skin of your chest or abdomen to help control abnormal heart rhythms. This device uses electrical pulses to prompt the heart to beat at a normal rate. Pacemakers are used to treat heart rhythms that are too slow. Wire (leads) are attached to the pacemaker that goes into the chambers of you heart. This is done in the hospital and usually requires and overnight stay. Please follow the instructions below, located under the special instructions section.   Follow-Up: Your physician recommends that you schedule a wound check appointment 10-14 days, after your procedure on 04/11/21, with the device clinic.  Your physician recommends that you schedule a follow up appointment in 91 days, after your procedure on 04/11/21, with Dr. Curt Bears.   Thank you for choosing [facility]!!   Trinidad Curet, RN [phone]   Any Other Special Instructions Will Be Listed Below (If Applicable).     Implantable Device Instructions  You are scheduled for:                  _____ Permanent Transvenous Pacemaker  on  04/11/2021  with Dr. Curt Bears.  1.   Please arrive at the [facility], Entrance "A"  at [facility] at  11:30 am on the day of your procedure. (The address is [address])  Do not eat or drink after midnight the night before your procedure.  3.   Complete pre procedure  lab work on 04/09/21.   You do not have to  be fasting.  4.   A) - Hold the following medications the morning of your procedure:  1. Diabetic medications (insulin and pill form)   2. Lasix           - All of your remaining medications may be taken with a small amount of water the morning of your procedure.  5.  Plan for an overnight stay.  Bring your insurance cards and a list of you medications.  6.  Wash your chest and neck with surgical scrub the evening before and the morning of your procedure.  Rinse well. Please review the surgical scrub instruction sheet given to you.                                                                                                                * If you have ANY questions after you get home, please call Trinidad Curet, RN @ [phone].  * Every attempt is  made to prevent procedures from being rescheduled.  Due to the nature of  Electrophysiology, rescheduling can happen.  The physician is always aware and directs the staff when this occurs.     Pacemaker Implantation, Adult Pacemaker implantation is a procedure to place a pacemaker inside your chest. A pacemaker is a small computer that sends electrical signals to the heart and helps your heart beat normally. A pacemaker also stores information about your heart rhythms. You may need pacemaker implantation if you: Have a slow heartbeat (bradycardia). Faint (syncope). Have shortness of breath (dyspnea) due to heart problems.  The pacemaker attaches to your heart through a wire, called a lead. Sometimes just one lead is needed. Other times, there will be two leads. There are two types of pacemakers: Transvenous pacemaker. This type is placed under the skin or muscle of your chest. The lead goes through a vein in the chest area to reach the inside of the heart. Epicardial pacemaker. This type is placed under the skin or muscle of your chest or belly. The lead goes through your chest to the outside of the heart.  Tell a health care provider  about: Any allergies you have. All medicines you are taking, including vitamins, herbs, eye drops, creams, and over-the-counter medicines. Any problems you or family members have had with anesthetic medicines. Any blood or bone disorders you have. Any surgeries you have had. Any medical conditions you have. Whether you are pregnant or may be pregnant. What are the risks? Generally, this is a safe procedure. However, problems may occur, including: Infection. Bleeding. Failure of the pacemaker or the lead. Collapse of a lung or bleeding into a lung. Blood clot inside a blood vessel with a lead. Damage to the heart. Infection inside the heart (endocarditis). Allergic reactions to medicines.  What happens before the procedure? Staying hydrated Follow instructions from your health care provider about hydration, which may include: Up to 2 hours before the procedure - you may continue to drink clear liquids, such as water, clear fruit juice, black coffee, and plain tea.  Eating and drinking restrictions Follow instructions from your health care provider about eating and drinking, which may include: 8 hours before the procedure - stop eating heavy meals or foods such as meat, fried foods, or fatty foods. 6 hours before the procedure - stop eating light meals or foods, such as toast or cereal. 6 hours before the procedure - stop drinking milk or drinks that contain milk. 2 hours before the procedure - stop drinking clear liquids.  Medicines Ask your health care provider about: Changing or stopping your regular medicines. This is especially important if you are taking diabetes medicines or blood thinners. Taking medicines such as aspirin and ibuprofen. These medicines can thin your blood. Do not take these medicines before your procedure if your health care provider instructs you not to. You may be given antibiotic medicine to help prevent infection. General instructions You will have a  heart evaluation. This may include an electrocardiogram (ECG), chest X-ray, and heart imaging (echocardiogram,  or echo) tests. You will have blood tests. Do not use any products that contain nicotine or tobacco, such as cigarettes and e-cigarettes. If you need help quitting, ask your health care provider. Plan to have someone take you home from the hospital or clinic. If you will be going home right after the procedure, plan to have someone with you for 24 hours. Ask your health care provider how your surgical site will be marked  or identified. What happens during the procedure? To reduce your risk of infection: Your health care team will wash or sanitize their hands. Your skin will be washed with soap. Hair may be removed from the surgical area. An IV tube will be inserted into one of your veins. You will be given one or more of the following: A medicine to help you relax (sedative). A medicine to numb the area (local anesthetic). A medicine to make you fall asleep (general anesthetic). If you are getting a transvenous pacemaker: An incision will be made in your upper chest. A pocket will be made for the pacemaker. It may be placed under the skin or between layers of muscle. The lead will be inserted into a blood vessel that returns to the heart. While X-rays are taken by an imaging machine (fluoroscopy), the lead will be advanced through the vein to the inside of your heart. The other end of the lead will be tunneled under the skin and attached to the pacemaker. If you are getting an epicardial pacemaker: An incision will be made near your ribs or breastbone (sternum) for the lead. The lead will be attached to the outside of your heart. Another incision will be made in your chest or upper belly to create a pocket for the pacemaker. The free end of the lead will be tunneled under the skin and attached to the pacemaker. The transvenous or epicardial pacemaker will be tested. Imaging  studies may be done to check the lead position. The incisions will be closed with stitches (sutures), adhesive strips, or skin glue. Bandages (dressing) will be placed over the incisions. The procedure may vary among health care providers and hospitals. What happens after the procedure? Your blood pressure, heart rate, breathing rate, and blood oxygen level will be monitored until the medicines you were given have worn off. You will be given antibiotics and pain medicine. ECG and chest x-rays will be done. You will wear a continuous type of ECG (Holter monitor) to check your heart rhythm. Your health care provider will program the pacemaker. Do not drive for 24 hours if you received a sedative. This information is not intended to replace advice given to you by your health care provider. Make sure you discuss any questions you have with your health care provider. Document Released: 02/14/2002 Document Revised: 09/14/2015 Document Reviewed: 08/08/2015 Elsevier Interactive Patient Education  2018 [employer].     Pacemaker Implantation, Adult, Care After This sheet gives you information about how to care for yourself after your procedure. Your health care provider may also give you more specific instructions. If you have problems or questions, contact your health care provider. What can I expect after the procedure? After the procedure, it is common to have: Mild pain. Slight bruising. Some swelling over the incision. A slight bump over the skin where the device was placed. Sometimes, it is possible to feel the device under the skin. This is normal.  Follow these instructions at home: Medicines Take over-the-counter and prescription medicines only as told by your health care provider. If you were prescribed an antibiotic medicine, take it as told by your health care provider. Do not stop taking the antibiotic even if you start to feel better. Wound care Do not remove the bandage on  your chest until directed to do so by your health care provider. After your bandage is removed, you may see pieces of tape called skin adhesive strips over the area where the cut was made (  incision site). Let them fall off on their own. Check the incision site every day to make sure it is not infected, bleeding, or starting to pull apart. Do not use lotions or ointments near the incision site unless directed to do so. Keep the incision area clean and dry for 2-3 days after the procedure or as directed by your health care provider. It takes several weeks for the incision site to completely heal. Do not take baths, swim, or use a hot tub for 7-10 days or as otherwise directed by your health care provider. Activity Do not drive or use heavy machinery while taking prescription pain medicine. Do not drive for 24 hours if you were given a medicine to help you relax (sedative). Check with your health care provider before you start to drive or play sports. Avoid sudden jerking, pulling, or chopping movements that pull your upper arm far away from your body. Avoid these movements for at least 6 weeks or as long as told by your health care provider. Do not lift your upper arm above your shoulders for at least 6 weeks or as long as told by your health care provider. This means no tennis, golf, or swimming. You may go back to work when your health care provider says it is okay. Pacemaker care You may be shown how to transfer data from your pacemaker through the phone to your health care provider. Always let all health care providers know about your pacemaker before you have any medical procedures or tests. Wear a medical ID bracelet or necklace stating that you have a pacemaker. Carry a pacemaker ID card with you at all times. Your pacemaker battery will last for 5-15 years. Routine checks by your health care provider will let the health care provider know when the battery is starting to run down. The pacemaker  will need to be replaced when the battery starts to run down. Do not use amateur Chief of Staff. Other electrical devices are safe to use, including power tools, lawn mowers, and speakers. If you are unsure of whether something is safe to use, ask your health care provider. When using your cell phone, hold it to the ear opposite the pacemaker. Do not leave your cell phone in a pocket over the pacemaker. Avoid places or objects that have a strong electric or magnetic field, including: Engineer, maintenance. When at the airport, let officials know that you have a pacemaker. Power plants. Large electrical generators. Radiofrequency transmission towers, such as cell phone and radio towers. General instructions Weigh yourself every day. If you suddenly gain weight, fluid may be building up in your body. Keep all follow-up visits as told by your health care provider. This is important. Contact a health care provider if: You gain weight suddenly. Your legs or feet swell. It feels like your heart is fluttering or skipping beats (heart palpitations). You have chills or a fever. You have more redness, swelling, or pain around your incisions. You have more fluid or blood coming from your incisions. Your incisions feel warm to the touch. You have pus or a bad smell coming from your incisions. Get help right away if: You have chest pain. You have trouble breathing or are short of breath. You become extremely tired. You are light-headed or you faint. This information is not intended to replace advice given to you by your health care provider. Make sure you discuss any questions you have with your health care provider. Document Released:  09/13/2004 Document Revised: 12/07/2015 Document Reviewed: 12/07/2015 Elsevier Interactive Patient Education  2018 [place] Discharge Instructions for  Pacemaker/Defibrillator Patients  ACTIVITY No heavy lifting  or vigorous activity with your left/right arm for 6 to 8 weeks.  Do not raise your left/right arm above your head for one week.  Gradually raise your affected arm as drawn below.           __  NO DRIVING for     ; you may begin driving on     .  WOUND CARE Keep the wound area clean and dry.  Do not get this area wet for one week. No showers for one week; you may shower on     . The tape/steri-strips on your wound will fall off; do not pull them off.  No bandage is needed on the site.  DO  NOT apply any creams, oils, or ointments to the wound area. If you notice any drainage or discharge from the wound, any swelling or bruising at the site, or you develop a fever > 101? F after you are discharged home, call the office at once.  SPECIAL INSTRUCTIONS You are still able to use cellular telephones; use the ear opposite the side where you have your pacemaker/defibrillator.  Avoid carrying your cellular phone near your device. When traveling through airports, show security personnel your identification card to avoid being screened in the metal detectors.  Ask the security personnel to use the hand wand. Avoid arc welding equipment, MRI testing (magnetic resonance imaging), TENS units (transcutaneous nerve stimulators).  Call the office for questions about other devices. Avoid electrical appliances that are in poor condition or are not properly grounded. Microwave ovens are safe to be near or to operate.  ADDITIONAL INFORMATION FOR DEFIBRILLATOR PATIENTS SHOULD YOUR DEVICE GO OFF: If your device goes off ONCE and you feel fine afterward, notify the device clinic nurses. If your device goes off ONCE and you do not feel well afterward, call 911. If your device goes off TWICE, call 911. If your device goes off THREE TIMES IN ONE DAY, call 911.  DO NOT DRIVE YOURSELF OR A FAMILY MEMBER WITH A DEFIBRILLATOR TO THE HOSPITAL--CALL 911.

## 2021-04-09 NOTE — Progress Notes (Signed)
Electrophysiology Office Note   Date:  04/09/2021   ID:  [name]., DOB May 02, 1950, MRN [id number]  PCP:  [name], MD  Cardiologist:  Virgina Jock Primary Electrophysiologist:  Melford Tullier Meredith Leeds, MD    Chief Complaint: Mobitz 1 AV block   History of Present Illness: [name]. is a 71 y.o. male who is being seen today for the evaluation of Mobitz 1 AV block at the request of [name]. Presenting today for electrophysiology evaluation.    He has a history significant for hypertension, hyperlipidemia, insulin-dependent diabetes, HIV, hemochromatosis.  He had a prior ECG done that showed Mobitz 1 AV block.  Echo showed an no evidence of hemochromatosis.  He represented to clinic with 2-1 AV block feeling weak and fatigued.  He is in Mobitz 1 AV block today and feeling well.  He states that his heart rate is slow he feels quite poorly with fatigue and shortness of breath.  Today, denies symptoms of palpitations, chest pain, shortness of breath, orthopnea, PND, lower extremity edema, claudication, dizziness, presyncope, syncope, bleeding, or neurologic sequela. The patient is tolerating medications without difficulties.     Past Medical History:  Diagnosis Date   Complication of anesthesia    ANESTHESIA AWARENESS  DURING ADRENALECTOMY AND COLONOSCOPY   Diabetes mellitus    on oral meds-no insulin   DIABETES MELLITUS, TYPE II, UNCONTROLLED 06/22/2006   GILBERT'S SYNDROME 02/25/2007   GOUT 06/22/2006   HEMOCHROMATOSIS, HX OF 12/15/2005   phlebotomy every 6 to 8 weeks--at [facility]   HIV disease ([place]) 1990   [facility]   HNP (herniated nucleus pulposus)    LUMBAR PAIN WITH PAIN DOWN LEFT LEG TO TOES-SOME NUMBNESS AND TINGLING IN BIG TOE   Hypertension    Past Surgical History:  Procedure Laterality Date   ADRENALECTOMY     RIGHT ADRENALECTOMY    CHOLECYSTECTOMY     LUMBAR LAMINECTOMY/DECOMPRESSION  MICRODISCECTOMY  04/23/2011   Procedure: LUMBAR LAMINECTOMY/DECOMPRESSION MICRODISCECTOMY;  Surgeon: Johnn Hai, MD;  Location: [facility];  Service: Orthopedics;  Laterality: N/A;  Decompression L5 S1 (X-Ray)   ORIF LEFT SHOULDER     STILL HAS HARDWARE IN THE SHOULDER     Current Outpatient Medications  Medication Sig Dispense Refill   allopurinol (ZYLOPRIM) 300 MG tablet Take 300 mg by mouth daily.     amLODipine (NORVASC) 5 MG tablet Take 1 tablet (5 mg total) by mouth daily. 90 tablet 2   atorvastatin (LIPITOR) 10 MG tablet Take 10 mg by mouth at bedtime.     BD PEN NEEDLE NANO 2ND GEN 32G X 4 MM MISC Inject into the skin daily.     cetirizine-pseudoephedrine (ZYRTEC-D) 5-120 MG tablet Take by mouth.     colchicine 0.6 MG tablet Take 0.6 mg by mouth as needed (gout).      dextromethorphan-guaiFENesin (MUCINEX DM) 30-600 MG 12hr tablet Take 1 tablet by mouth every 12 (twelve) hours.     emtricitabine-rilpivir-tenofovir AF (ODEFSEY) 200-25-25 MG TABS tablet Take 1 tablet by mouth daily with breakfast. 90 tablet 4   FARXIGA 10 MG TABS tablet Take 10 mg by mouth daily.     furosemide (LASIX) 20 MG tablet 1 tablet     gabapentin (NEURONTIN) 300 MG capsule TAKE ONE CAPSULE BY MOUTH TWICE DAILY (Patient taking differently: Take 300 mg by mouth 2 (two) times daily.) 30 capsule 1   glucose blood (ONETOUCH VERIO) test strip use to  test three times daily as needed     hydrALAZINE (APRESOLINE) 25 MG tablet Take 25 mg by mouth 3 (three) times daily. Patient states that he is taken 50 mg     Icosapent Ethyl (VASCEPA PO) Take 1 tablet by mouth 2 (two) times daily.      ketoconazole (NIZORAL) 2 % cream Apply 1 application topically daily. 60 g 0   metFORMIN (GLUCOPHAGE) 1000 MG tablet Take 1 tablet by mouth 2 (two) times daily with a meal.     sildenafil (REVATIO) 20 MG tablet Take 1 tablet by mouth daily.     SOLIQUA 100-33 UNT-MCG/ML SOPN Injects 60 units daily  12   tadalafil (CIALIS) 20 MG tablet  Take 20 mg by mouth daily as needed for erectile dysfunction.     telmisartan (MICARDIS) 80 MG tablet Take 80 mg by mouth daily before breakfast.     testosterone cypionate (DEPOTESTOSTERONE CYPIONATE) 200 MG/ML injection Inject 200 mg into the muscle every 14 (fourteen) days.     vitamin B-12 (CYANOCOBALAMIN) 1000 MCG tablet Take 1,000 mcg by mouth daily.     No current facility-administered medications for this visit.    Allergies:   Hydromorphone, Scopolamine, Beta adrenergic blockers, Escitalopram oxalate, Metformin hcl, Morphine sulfate, Codeine, and Meperidine hcl   Social History:  The patient  reports that he has never smoked. He has never used smokeless tobacco. He reports that he does not drink alcohol and does not use drugs.   Family History:  The patient's family history includes Atrial fibrillation in his father and mother; Hyperlipidemia in his father and mother; Hypertension in his father and mother.    ROS:  Please see the history of present illness.   Otherwise, review of systems is positive for none.   All other systems are reviewed and negative.   PHYSICAL EXAM: VS:  BP (!) 192/76    Pulse 82    Ht 5\' 8"  (1.727 m)    Wt 221 lb 12.8 oz (100.6 kg)    SpO2 94%    BMI 33.72 kg/m  , BMI Body mass index is 33.72 kg/m. GEN: Well nourished, well developed, in no acute distress  HEENT: normal  Neck: no JVD, carotid bruits, or masses Cardiac: RRR; no murmurs, rubs, or gallops,no edema  Respiratory:  clear to auscultation bilaterally, normal work of breathing GI: soft, nontender, nondistended, + BS MS: no deformity or atrophy  Skin: warm and dry Neuro:  Strength and sensation are intact Psych: euthymic mood, full affect  EKG:  EKG is ordered today. Personal review of the ekg ordered shows sinus rhythm, Mobitz 1 AV block  Recent Labs: 01/30/2021: ALT 14; BUN 23; Creat 1.49; Hemoglobin 18.5; Platelets 184; Potassium 4.0; Sodium 141    Lipid Panel     Component Value  Date/Time   CHOL 84 01/30/2021 0837   TRIG 250 (H) 01/30/2021 0837   HDL 32 (L) 01/30/2021 0837   CHOLHDL 2.6 01/30/2021 0837   VLDL NOT CALC 04/22/2016 1536   LDLCALC 23 01/30/2021 0837   LDLDIRECT 55.9 11/08/2010 0835     Wt Readings from Last 3 Encounters:  04/09/21 221 lb 12.8 oz (100.6 kg)  04/02/21 226 lb 3.2 oz (102.6 kg)  02/19/21 228 lb (103.4 kg)      Other studies Reviewed: Additional studies/ records that were reviewed today include: TTE 11/25/19  Review of the above records today demonstrates:  Normal LV systolic function with EF 71%. Left ventricle cavity is normal  in size. Normal left ventricular wall thickness. Normal global wall  motion. Patient appeared to be in Wenckebach heart block by EKG during  eam. Hence diastolic function not performed. LVEDP appears to be elevated  by E/e'. Calculated EF 71%.  Structurally normal mitral valve.  Mild (Grade I) mitral regurgitation.  E-wave dominant mitral inflow.  Structurally normal tricuspid valve.  Mild tricuspid regurgitation. No  evidence of pulmonary hypertension.   ASSESSMENT AND PLAN:  1.  Second-degree AV block: Has Mobitz 1 AV block as well as 2-1 AV block.  He feels weak and fatigued when his heart rate is slow.  Due to that, we Leyna Vanderkolk plan for pacemaker implant.  Risks and benefits have been discussed which were bleeding, infection, tamponade, pneumothorax.  He understand these risks and is agreed to the procedure.  2.  Hyperlipidemia: Continue Vascepa and atorvastatin  3.  Hypertension: Blood pressure is elevated today.  We Detrice Cales restart his amlodipine 5 mg.  Plan per primary physician and nephrology.   Current medicines are reviewed at length with the patient today.   The patient does not have concerns regarding his medicines.  The following changes were made today:  none  Labs/ tests ordered today include:  Orders Placed This Encounter  Procedures   Basic metabolic panel   CBC   EKG 12-Lead      Disposition:   FU with Maahir Horst 3 months  Signed, Abiel Antrim Meredith Leeds, MD  04/09/2021 5:01 PM     [facility] [address] [place] [address] [phone] (office) [phone] (fax)

## 2021-04-10 ENCOUNTER — Telehealth: Payer: Self-pay | Admitting: Cardiology

## 2021-04-10 ENCOUNTER — Encounter: Payer: Self-pay | Admitting: Cardiology

## 2021-04-10 DIAGNOSIS — E291 Testicular hypofunction: Secondary | ICD-10-CM | POA: Diagnosis not present

## 2021-04-10 LAB — CBC
Hematocrit: 52.7 % — ABNORMAL HIGH (ref 37.5–51.0)
Hemoglobin: 18.1 g/dL — ABNORMAL HIGH (ref 13.0–17.7)
MCH: 32.7 pg (ref 26.6–33.0)
MCHC: 34.3 g/dL (ref 31.5–35.7)
MCV: 95 fL (ref 79–97)
Platelets: 194 10*3/uL (ref 150–450)
RBC: 5.54 x10E6/uL (ref 4.14–5.80)
RDW: 13.1 % (ref 11.6–15.4)
WBC: 9.4 10*3/uL (ref 3.4–10.8)

## 2021-04-10 LAB — BASIC METABOLIC PANEL
BUN/Creatinine Ratio: 7 — ABNORMAL LOW (ref 10–24)
BUN: 10 mg/dL (ref 8–27)
CO2: 17 mmol/L — ABNORMAL LOW (ref 20–29)
Calcium: 9.2 mg/dL (ref 8.6–10.2)
Chloride: 107 mmol/L — ABNORMAL HIGH (ref 96–106)
Creatinine, Ser: 1.39 mg/dL — ABNORMAL HIGH (ref 0.76–1.27)
Glucose: 149 mg/dL — ABNORMAL HIGH (ref 70–99)
Potassium: 4.1 mmol/L (ref 3.5–5.2)
Sodium: 147 mmol/L — ABNORMAL HIGH (ref 134–144)
eGFR: 55 mL/min/{1.73_m2} — ABNORMAL LOW (ref >59)

## 2021-04-10 NOTE — Pre-Procedure Instructions (Signed)
Instructed patient on the following items: Arrival time 1130 Nothing to eat or drink after midnight No meds AM of procedure Responsible person to drive you home and stay with you for 24 hrs Wash with special soap night before and morning of procedure  

## 2021-04-10 NOTE — Telephone Encounter (Signed)
Informed pt that due to hospital bed shortage, and being this procedure is not urgent per Dr. Curt Bears then we should r/s it if he does not have someone to stay with him post procedure for 24 hours.  Pt is going to look into sitter service first as he does not want to reschedule this procedure if possible.  He will update me later today.

## 2021-04-10 NOTE — Telephone Encounter (Signed)
New Message:      Patient says he needs to talk to [facility] about his procedure tomorrow. He have not been able to get anybody to stay with him tomorrow night.

## 2021-04-11 ENCOUNTER — Other Ambulatory Visit: Payer: Self-pay

## 2021-04-11 ENCOUNTER — Encounter (HOSPITAL_COMMUNITY)
Admission: RE | Disposition: A | Discharge status: Home / Self Care | Payer: Self-pay | Source: Home / Self Care | Attending: Cardiology

## 2021-04-11 ENCOUNTER — Ambulatory Visit (HOSPITAL_COMMUNITY): Payer: BC Managed Care – PPO

## 2021-04-11 ENCOUNTER — Ambulatory Visit (HOSPITAL_COMMUNITY)
Admission: RE | Admit: 2021-04-11 | Discharge: 2021-04-12 | Disposition: A | Discharge status: Home / Self Care | Payer: BC Managed Care – PPO | Attending: Cardiology | Admitting: Cardiology

## 2021-04-11 DIAGNOSIS — Z20822 Contact with and (suspected) exposure to covid-19: Secondary | ICD-10-CM | POA: Diagnosis not present

## 2021-04-11 DIAGNOSIS — R5383 Other fatigue: Secondary | ICD-10-CM | POA: Insufficient documentation

## 2021-04-11 DIAGNOSIS — E1122 Type 2 diabetes mellitus with diabetic chronic kidney disease: Secondary | ICD-10-CM | POA: Diagnosis not present

## 2021-04-11 DIAGNOSIS — M549 Dorsalgia, unspecified: Secondary | ICD-10-CM | POA: Insufficient documentation

## 2021-04-11 DIAGNOSIS — Z21 Asymptomatic human immunodeficiency virus [HIV] infection status: Secondary | ICD-10-CM | POA: Insufficient documentation

## 2021-04-11 DIAGNOSIS — G8929 Other chronic pain: Secondary | ICD-10-CM | POA: Insufficient documentation

## 2021-04-11 DIAGNOSIS — Z7984 Long term (current) use of oral hypoglycemic drugs: Secondary | ICD-10-CM | POA: Diagnosis not present

## 2021-04-11 DIAGNOSIS — Z9889 Other specified postprocedural states: Secondary | ICD-10-CM | POA: Diagnosis not present

## 2021-04-11 DIAGNOSIS — I441 Atrioventricular block, second degree: Secondary | ICD-10-CM | POA: Diagnosis not present

## 2021-04-11 DIAGNOSIS — R001 Bradycardia, unspecified: Secondary | ICD-10-CM | POA: Insufficient documentation

## 2021-04-11 DIAGNOSIS — Z95818 Presence of other cardiac implants and grafts: Secondary | ICD-10-CM

## 2021-04-11 DIAGNOSIS — E785 Hyperlipidemia, unspecified: Secondary | ICD-10-CM | POA: Diagnosis not present

## 2021-04-11 DIAGNOSIS — I129 Hypertensive chronic kidney disease with stage 1 through stage 4 chronic kidney disease, or unspecified chronic kidney disease: Secondary | ICD-10-CM | POA: Diagnosis not present

## 2021-04-11 DIAGNOSIS — Z79899 Other long term (current) drug therapy: Secondary | ICD-10-CM | POA: Diagnosis not present

## 2021-04-11 DIAGNOSIS — N183 Chronic kidney disease, stage 3 unspecified: Secondary | ICD-10-CM | POA: Diagnosis not present

## 2021-04-11 DIAGNOSIS — Z794 Long term (current) use of insulin: Secondary | ICD-10-CM | POA: Diagnosis not present

## 2021-04-11 DIAGNOSIS — R0602 Shortness of breath: Secondary | ICD-10-CM | POA: Diagnosis present

## 2021-04-11 HISTORY — PX: PACEMAKER IMPLANT: EP1218

## 2021-04-11 LAB — CBC
HCT: 46 % (ref 39.0–52.0)
Hemoglobin: 15.5 g/dL (ref 13.0–17.0)
MCH: 32.2 pg (ref 26.0–34.0)
MCHC: 33.7 g/dL (ref 30.0–36.0)
MCV: 95.6 fL (ref 80.0–100.0)
Platelets: 195 10*3/uL (ref 150–400)
RBC: 4.81 MIL/uL (ref 4.22–5.81)
RDW: 14.1 % (ref 11.5–15.5)
WBC: 9.4 10*3/uL (ref 4.0–10.5)
nRBC: 0 % (ref 0.0–0.2)

## 2021-04-11 LAB — BASIC METABOLIC PANEL
Anion gap: 10 (ref 5–15)
BUN: 11 mg/dL (ref 8–23)
CO2: 21 mmol/L — ABNORMAL LOW (ref 22–32)
Calcium: 8 mg/dL — ABNORMAL LOW (ref 8.9–10.3)
Chloride: 108 mmol/L (ref 98–111)
Creatinine, Ser: 1.05 mg/dL (ref 0.61–1.24)
GFR, Estimated: >60 mL/min (ref >60)
Glucose, Bld: 117 mg/dL — ABNORMAL HIGH (ref 70–99)
Potassium: 3.1 mmol/L — ABNORMAL LOW (ref 3.5–5.1)
Sodium: 139 mmol/L (ref 135–145)

## 2021-04-11 LAB — BRAIN NATRIURETIC PEPTIDE: B Natriuretic Peptide: 196.1 pg/mL — ABNORMAL HIGH (ref 0.0–100.0)

## 2021-04-11 LAB — RESP PANEL BY RT-PCR (FLU A&B, COVID) ARPGX2
Influenza A by PCR: NEGATIVE
Influenza B by PCR: NEGATIVE
SARS Coronavirus 2 by RT PCR: NEGATIVE

## 2021-04-11 LAB — GLUCOSE, CAPILLARY
Glucose-Capillary: 124 mg/dL — ABNORMAL HIGH (ref 70–99)
Glucose-Capillary: 155 mg/dL — ABNORMAL HIGH (ref 70–99)
Glucose-Capillary: 123 mg/dL — ABNORMAL HIGH (ref 70–99)

## 2021-04-11 LAB — TSH: TSH: 0.822 u[IU]/mL (ref 0.350–4.500)

## 2021-04-11 SURGERY — PACEMAKER IMPLANT

## 2021-04-11 MED ORDER — SODIUM CHLORIDE 0.9 % IV SOLN: 250.0000 mL | INTRAVENOUS | Status: DC | PRN

## 2021-04-11 MED ORDER — SODIUM CHLORIDE 0.9 % IV SOLN
INTRAVENOUS | Status: AC
  Filled 2021-04-11: qty 2

## 2021-04-11 MED ORDER — FUROSEMIDE 20 MG PO TABS
20.0000 mg | ORAL_TABLET | Freq: Every morning | ORAL | Status: DC
  Administered 2021-04-12: 20 mg via ORAL
  Filled 2021-04-11: qty 1

## 2021-04-11 MED ORDER — SODIUM CHLORIDE 0.9% FLUSH: 3.0000 mL | INTRAVENOUS | Status: DC | PRN

## 2021-04-11 MED ORDER — INSULIN GLARGINE-LIXISENATIDE 100-33 UNT-MCG/ML ~~LOC~~ SOPN: 60.0000 [IU] | PEN_INJECTOR | Freq: Every morning | SUBCUTANEOUS | Status: DC

## 2021-04-11 MED ORDER — SODIUM CHLORIDE 0.9% FLUSH
3.0000 mL | Freq: Two times a day (BID) | INTRAVENOUS | Status: DC
  Administered 2021-04-11 – 2021-04-12 (×2): 3 mL via INTRAVENOUS

## 2021-04-11 MED ORDER — CARVEDILOL 25 MG PO TABS: 25.0000 mg | ORAL_TABLET | Freq: Two times a day (BID) | ORAL | Status: DC

## 2021-04-11 MED ORDER — CARVEDILOL 12.5 MG PO TABS
12.5000 mg | ORAL_TABLET | Freq: Two times a day (BID) | ORAL | Status: DC
  Administered 2021-04-11 – 2021-04-12 (×2): 12.5 mg via ORAL
  Filled 2021-04-11 (×3): qty 1

## 2021-04-11 MED ORDER — HEPARIN (PORCINE) IN NACL 1000-0.9 UT/500ML-% IV SOLN
INTRAVENOUS | Status: DC | PRN
  Administered 2021-04-11: 500 mL

## 2021-04-11 MED ORDER — MIDAZOLAM HCL 5 MG/5ML IJ SOLN
INTRAMUSCULAR | Status: DC | PRN
  Administered 2021-04-11 (×2): 1 mg via INTRAVENOUS

## 2021-04-11 MED ORDER — FENTANYL CITRATE (PF) 100 MCG/2ML IJ SOLN
INTRAMUSCULAR | Status: AC
  Filled 2021-04-11: qty 2

## 2021-04-11 MED ORDER — GABAPENTIN 300 MG PO CAPS
300.0000 mg | ORAL_CAPSULE | Freq: Every day | ORAL | Status: DC
  Administered 2021-04-12: 300 mg via ORAL
  Filled 2021-04-11: qty 1

## 2021-04-11 MED ORDER — SODIUM CHLORIDE 0.9 % IV SOLN: 80.0000 mg | INTRAVENOUS | Status: DC

## 2021-04-11 MED ORDER — GABAPENTIN 300 MG PO CAPS
600.0000 mg | ORAL_CAPSULE | Freq: Every day | ORAL | Status: DC
  Administered 2021-04-11: 600 mg via ORAL
  Filled 2021-04-11: qty 2

## 2021-04-11 MED ORDER — ACETAMINOPHEN 325 MG PO TABS
325.0000 mg | ORAL_TABLET | ORAL | Status: DC | PRN
  Administered 2021-04-11: 650 mg via ORAL
  Filled 2021-04-11: qty 2

## 2021-04-11 MED ORDER — EMTRICITAB-RILPIVIR-TENOFOV AF 200-25-25 MG PO TABS
1.0000 | ORAL_TABLET | Freq: Every evening | ORAL | Status: DC
Start: 2021-04-11 — End: 2021-04-12
  Administered 2021-04-11: 1 via ORAL
  Filled 2021-04-11: qty 1

## 2021-04-11 MED ORDER — HYDRALAZINE HCL 50 MG PO TABS
75.0000 mg | ORAL_TABLET | Freq: Three times a day (TID) | ORAL | Status: DC
  Administered 2021-04-11 – 2021-04-12 (×2): 75 mg via ORAL
  Filled 2021-04-11 (×2): qty 1

## 2021-04-11 MED ORDER — VANCOMYCIN HCL 1500 MG/300ML IV SOLN
1500.0000 mg | Freq: Once | INTRAVENOUS | Status: AC
  Administered 2021-04-11: 1500 mg via INTRAVENOUS
  Filled 2021-04-11: qty 300

## 2021-04-11 MED ORDER — INSULIN ASPART 100 UNIT/ML IJ SOLN: 0.0000 [IU] | Freq: Three times a day (TID) | INTRAMUSCULAR | Status: DC

## 2021-04-11 MED ORDER — CEFAZOLIN SODIUM-DEXTROSE 1-4 GM/50ML-% IV SOLN
INTRAVENOUS | Status: AC
  Filled 2021-04-11: qty 50

## 2021-04-11 MED ORDER — HEPARIN (PORCINE) IN NACL 1000-0.9 UT/500ML-% IV SOLN
INTRAVENOUS | Status: AC
  Filled 2021-04-11: qty 500

## 2021-04-11 MED ORDER — MIDAZOLAM HCL 5 MG/5ML IJ SOLN
INTRAMUSCULAR | Status: AC
  Filled 2021-04-11: qty 5

## 2021-04-11 MED ORDER — ATORVASTATIN CALCIUM 10 MG PO TABS
10.0000 mg | ORAL_TABLET | Freq: Every day | ORAL | Status: DC
  Administered 2021-04-11: 10 mg via ORAL
  Filled 2021-04-11: qty 1

## 2021-04-11 MED ORDER — LORATADINE 10 MG PO TABS: 10.0000 mg | ORAL_TABLET | Freq: Every day | ORAL | Status: DC | PRN

## 2021-04-11 MED ORDER — ALLOPURINOL 300 MG PO TABS
300.0000 mg | ORAL_TABLET | Freq: Every morning | ORAL | Status: DC
  Administered 2021-04-12: 300 mg via ORAL
  Filled 2021-04-11: qty 1

## 2021-04-11 MED ORDER — CHLORHEXIDINE GLUCONATE 4 % EX LIQD
4.0000 "application " | Freq: Once | CUTANEOUS | Status: DC
  Filled 2021-04-11: qty 60

## 2021-04-11 MED ORDER — DIPHENHYDRAMINE HCL 50 MG/ML IJ SOLN
25.0000 mg | Freq: Once | INTRAMUSCULAR | Status: AC
  Administered 2021-04-11: 25 mg via INTRAVENOUS
  Filled 2021-04-11: qty 1

## 2021-04-11 MED ORDER — AMLODIPINE BESYLATE 5 MG PO TABS
5.0000 mg | ORAL_TABLET | Freq: Every day | ORAL | Status: DC
  Administered 2021-04-12: 5 mg via ORAL
  Filled 2021-04-11: qty 1

## 2021-04-11 MED ORDER — DM-GUAIFENESIN ER 30-600 MG PO TB12: 1.0000 | ORAL_TABLET | Freq: Two times a day (BID) | ORAL | Status: DC | PRN

## 2021-04-11 MED ORDER — SODIUM CHLORIDE 0.9 % IV SOLN: INTRAVENOUS | Status: DC

## 2021-04-11 MED ORDER — FENTANYL CITRATE (PF) 100 MCG/2ML IJ SOLN
INTRAMUSCULAR | Status: DC | PRN
  Administered 2021-04-11 (×2): 25 ug via INTRAVENOUS

## 2021-04-11 MED ORDER — ACETAMINOPHEN 325 MG PO TABS
ORAL_TABLET | ORAL | Status: AC
  Filled 2021-04-11: qty 2

## 2021-04-11 MED ORDER — DAPAGLIFLOZIN PROPANEDIOL 10 MG PO TABS
10.0000 mg | ORAL_TABLET | Freq: Every morning | ORAL | Status: DC
  Administered 2021-04-12: 10 mg via ORAL
  Filled 2021-04-11: qty 1

## 2021-04-11 MED ORDER — ONDANSETRON HCL 4 MG/2ML IJ SOLN: 4.0000 mg | Freq: Four times a day (QID) | INTRAMUSCULAR | Status: DC | PRN

## 2021-04-11 MED ORDER — VANCOMYCIN HCL IN DEXTROSE 1-5 GM/200ML-% IV SOLN: 1000.0000 mg | INTRAVENOUS | Status: DC

## 2021-04-11 MED ORDER — ICOSAPENT ETHYL 1 G PO CAPS
2.0000 g | ORAL_CAPSULE | Freq: Two times a day (BID) | ORAL | Status: DC
Start: 2021-04-11 — End: 2021-04-12
  Administered 2021-04-11 – 2021-04-12 (×2): 2 g via ORAL
  Filled 2021-04-11 (×2): qty 2

## 2021-04-11 MED ORDER — POTASSIUM CHLORIDE CRYS ER 20 MEQ PO TBCR
80.0000 meq | EXTENDED_RELEASE_TABLET | Freq: Once | ORAL | Status: AC
  Administered 2021-04-12: 80 meq via ORAL
  Filled 2021-04-11: qty 4

## 2021-04-11 MED ORDER — CEFAZOLIN SODIUM-DEXTROSE 2-4 GM/100ML-% IV SOLN
2.0000 g | INTRAVENOUS | Status: AC
  Administered 2021-04-11: 2 g via INTRAVENOUS

## 2021-04-11 MED ORDER — LIDOCAINE HCL (PF) 1 % IJ SOLN
INTRAMUSCULAR | Status: DC | PRN
  Administered 2021-04-11: 60 mL

## 2021-04-11 MED ORDER — LIDOCAINE HCL 1 % IJ SOLN
INTRAMUSCULAR | Status: AC
  Filled 2021-04-11: qty 60

## 2021-04-11 MED ORDER — CEFAZOLIN SODIUM-DEXTROSE 1-4 GM/50ML-% IV SOLN
1.0000 g | Freq: Four times a day (QID) | INTRAVENOUS | Status: DC
  Administered 2021-04-11: 1 g via INTRAVENOUS

## 2021-04-11 MED ORDER — CEFAZOLIN SODIUM-DEXTROSE 2-4 GM/100ML-% IV SOLN
INTRAVENOUS | Status: AC
  Filled 2021-04-11: qty 100

## 2021-04-11 MED ORDER — IRBESARTAN 150 MG PO TABS
300.0000 mg | ORAL_TABLET | Freq: Every day | ORAL | Status: DC
  Administered 2021-04-12: 300 mg via ORAL
  Filled 2021-04-11: qty 2

## 2021-04-11 SURGICAL SUPPLY — 13 items
CABLE SURGICAL S-101-97-12 (CABLE) ×2 IMPLANT
CATH RIGHTSITE C315HIS02 (CATHETERS) ×1 IMPLANT
IPG PACE AZUR XT DR MRI W1DR01 (Pacemaker) IMPLANT
LEAD CAPSURE NOVUS 5076-52CM (Lead) ×1 IMPLANT
LEAD SELECT SECURE 3830 383069 (Lead) IMPLANT
PACE AZURE XT DR MRI W1DR01 (Pacemaker) ×2 IMPLANT
PAD DEFIB RADIO PHYSIO CONN (PAD) ×2 IMPLANT
SELECT SECURE 3830 383069 (Lead) ×2 IMPLANT
SHEATH 7FR PRELUDE SNAP 13 (SHEATH) ×1 IMPLANT
SHEATH 9FR PRELUDE SNAP 13 (SHEATH) ×1 IMPLANT
SLITTER 6232ADJ (MISCELLANEOUS) ×1 IMPLANT
TRAY PACEMAKER INSERTION (PACKS) ×2 IMPLANT
WIRE HI TORQ VERSACORE-J 145CM (WIRE) ×1 IMPLANT

## 2021-04-11 NOTE — Interval H&P Note (Signed)
History and Physical Interval Note:  04/11/2021 1:11 PM  [name].  has presented today for surgery, with the diagnosis of heart block.  The various methods of treatment have been discussed with the patient and family. After consideration of risks, benefits and other options for treatment, the patient has consented to  Procedure(s): PACEMAKER IMPLANT (N/A) as a surgical intervention.  The patient's history has been reviewed, patient examined, no change in status, stable for surgery.  I have reviewed the patient's chart and labs.  Questions were answered to the patient's satisfaction.     Aira Sallade [employer]

## 2021-04-11 NOTE — Progress Notes (Signed)
When pt returned from CXR at 1915, pt c/o feeling lightheaded, warm all over, and short of breath. Pt hooked back up to cardiac monitor. HR in low 100's and BP 201/73. RN called Dr. Curt Bears and notified him of CXR results and pt's condition. Dayna APP came to bedside to assess patient. Bed request placed and assigned on 6E for further evaluation. Orders for carvedilol placed and will be given by RN.

## 2021-04-11 NOTE — Progress Notes (Signed)
Pt safely transported to 6E22. Report given to Amor, Therapist, sports. Pt's VSS upon arrival to the unit. 25mg  benadryl IV and 12.5 coreg PO given by this RN upon arrival to 0R56.

## 2021-04-11 NOTE — Progress Notes (Signed)
Pharmacy Antibiotic Note  [name]. is a 71 y.o. male admitted on 04/11/2021 with surgical prophylaxis.  Pharmacy has been consulted for vancomycin dosing.  Patient s/p pacemaker placement 2/2 with possible rash to cefazolin. Switched to vancomycin per EP.   2/2 Vancomycin 1000 mg Q 24 hr Scr used: 1.39 mg/dL Weight: 102.1 kg Vd coeff: 0.5 L/kg Est AUC: 438  Plan: Vancomycin 1500mg  x1 then 1000mg  q24 hr  F/u duration   Height: 5\' 8"  (172.7 cm) Weight: 102.1 kg (225 lb) IBW/kg (Calculated) : 68.4  Temp (24hrs), Avg:98.4 F (36.9 C), Min:98.4 F (36.9 C), Max:98.4 F (36.9 C)  Recent Labs  Lab 04/09/21 1625  WBC 9.4  CREATININE 1.39*    Estimated Creatinine Clearance: 58.1 mL/min (A) (by C-G formula based on SCr of 1.39 mg/dL (H)).    Allergies  Allergen Reactions   Hydromorphone Itching and Rash   Scopolamine     GIVEN WITH A SURGERY YRS AGO--CAUSED HALLUNCINATIONS Other reaction(s): Confusion/Altered Mental Status Given during Surgery many years ago - Caused Hallucinations GIVEN WITH A SURGERY YRS AGO--CAUSED HALLUNCINATIONS GIVEN WITH A SURGERY YRS AGO--CAUSED HALLUNCINATIONS   Beta Adrenergic Blockers Other (See Comments)    Wenchebach--slowed heart rhythm    Escitalopram Oxalate Rash and Other (See Comments)    lightheaded,  visual  disturbances   Metformin Hcl Diarrhea and Nausea Only    Patient is tolerating    Codeine Itching   Meperidine Hcl Itching   Morphine Sulfate Rash     Thank you for allowing pharmacy to be a part of this patients care.  Benetta Spar, PharmD, BCPS, BCCP Clinical Pharmacist  Please check AMION for all [place] phone numbers After 10:00 PM, call [place] [phone]

## 2021-04-11 NOTE — Discharge Instructions (Addendum)
° ° °  Supplemental Discharge Instructions for  Pacemaker/Defibrillator Patients   Activity No heavy lifting or vigorous activity with your left/right arm for 6 to 8 weeks.  Do not raise your left/right arm above your head for one week.  Gradually raise your affected arm as drawn below.             04/16/21                      04/17/21                     04/18/21                    04/19/21 __  NO DRIVING until cleared to at your wound check visit.  WOUND CARE Keep the wound area clean and dry.  Do not get this area wet , no showers until cleared to at your wound c heck visit. The tape/steri-strips on your wound will fall off; do not pull them off.  No bandage is needed on the site.  DO  NOT apply any creams, oils, or ointments to the wound area. If you notice any drainage or discharge from the wound, any swelling or bruising at the site, or you develop a fever > 101? F after you are discharged home, call the office at once.  Special Instructions You are still able to use cellular telephones; use the ear opposite the side where you have your pacemaker/defibrillator.  Avoid carrying your cellular phone near your device. When traveling through airports, show security personnel your identification card to avoid being screened in the metal detectors.  Ask the security personnel to use the hand wand. Avoid arc welding equipment, MRI testing (magnetic resonance imaging), TENS units (transcutaneous nerve stimulators).  Call the office for questions about other devices. Avoid electrical appliances that are in poor condition or are not properly grounded. Microwave ovens are safe to be near or to operate.

## 2021-04-11 NOTE — Progress Notes (Addendum)
Dr. Curt Bears received call from short stay that patient was not feeling well this evening and blood pressure had begun rising further. Post-pacer HR has been 90s-low 100s, sinus tach with V pacing. Patient began feeling warm and lightheaded this evening. When he stood up he felt somewhat short of breath. He also continues to have a cough at this time which has been going on over a week. He reports he had a URI a little over a week ago and tested negative for Covid 4 times. He completed a week of Augmentin and guaifenesin and did feel he was starting to see a gradual improvement prior to admission. Last BP 203/74. Pulse ox normal. Temp 31F. On exam he is resting comfortably without acute pain. Able to speak in complete sentences calmly. He has generally good air movement but coarse BS throughout with occasional scattered rhonchi. No wheezing, rales, or stridor. No edema. Also has papular rash over his back, not pruritic. CXR this evening showed new dual chamber pacemaker with leads overlying the right atrium and right ventricle, no visible pneumothorax, no focal airspace disease or pleural effusion. He was on carvedilol 25mg  BID up until recent cessation due to slow HR/bradycardia. Discussed findings with Dr. Curt Bears, who recommends following plan: - keep for observation overnight  - reinitiate carvedilol 12.5mg  BID this evening  - supportive care for cough - trial dose of benadryl for rash (patient did have this area shaved earlier today, query follicular irritation, but will err on cautious side) - stop ancef, switch to vancomycin 1.5g q12hr - admit Covid swab ordered - CBGs ordered - short stay aware to check next, have given them fellow pager if any additional needs arise - basic labs ordered -> will s/o to fellow to review labs upon completion - f/u labs in AM  If cough & rash persist in AM, may need medicine consultation given his comorbidities.  Ottis Sarnowski PA-C

## 2021-04-12 ENCOUNTER — Encounter (HOSPITAL_COMMUNITY): Payer: Self-pay | Admitting: Cardiology

## 2021-04-12 ENCOUNTER — Other Ambulatory Visit (HOSPITAL_COMMUNITY): Payer: Self-pay

## 2021-04-12 DIAGNOSIS — I129 Hypertensive chronic kidney disease with stage 1 through stage 4 chronic kidney disease, or unspecified chronic kidney disease: Secondary | ICD-10-CM | POA: Diagnosis not present

## 2021-04-12 DIAGNOSIS — Z20822 Contact with and (suspected) exposure to covid-19: Secondary | ICD-10-CM | POA: Diagnosis not present

## 2021-04-12 DIAGNOSIS — R5383 Other fatigue: Secondary | ICD-10-CM | POA: Diagnosis not present

## 2021-04-12 DIAGNOSIS — E1122 Type 2 diabetes mellitus with diabetic chronic kidney disease: Secondary | ICD-10-CM | POA: Diagnosis not present

## 2021-04-12 DIAGNOSIS — Z794 Long term (current) use of insulin: Secondary | ICD-10-CM | POA: Diagnosis not present

## 2021-04-12 DIAGNOSIS — Z7984 Long term (current) use of oral hypoglycemic drugs: Secondary | ICD-10-CM | POA: Diagnosis not present

## 2021-04-12 DIAGNOSIS — G8929 Other chronic pain: Secondary | ICD-10-CM | POA: Diagnosis not present

## 2021-04-12 DIAGNOSIS — I441 Atrioventricular block, second degree: Secondary | ICD-10-CM | POA: Diagnosis not present

## 2021-04-12 DIAGNOSIS — Z21 Asymptomatic human immunodeficiency virus [HIV] infection status: Secondary | ICD-10-CM | POA: Diagnosis not present

## 2021-04-12 DIAGNOSIS — R001 Bradycardia, unspecified: Secondary | ICD-10-CM | POA: Diagnosis not present

## 2021-04-12 DIAGNOSIS — M549 Dorsalgia, unspecified: Secondary | ICD-10-CM | POA: Diagnosis not present

## 2021-04-12 DIAGNOSIS — N183 Chronic kidney disease, stage 3 unspecified: Secondary | ICD-10-CM | POA: Diagnosis not present

## 2021-04-12 DIAGNOSIS — Z79899 Other long term (current) drug therapy: Secondary | ICD-10-CM | POA: Diagnosis not present

## 2021-04-12 DIAGNOSIS — E785 Hyperlipidemia, unspecified: Secondary | ICD-10-CM | POA: Diagnosis not present

## 2021-04-12 LAB — CBC
HCT: 45.6 % (ref 39.0–52.0)
Hemoglobin: 15.6 g/dL (ref 13.0–17.0)
MCH: 32.6 pg (ref 26.0–34.0)
MCHC: 34.2 g/dL (ref 30.0–36.0)
MCV: 95.2 fL (ref 80.0–100.0)
Platelets: 199 10*3/uL (ref 150–400)
RBC: 4.79 MIL/uL (ref 4.22–5.81)
RDW: 14.1 % (ref 11.5–15.5)
WBC: 9.1 10*3/uL (ref 4.0–10.5)
nRBC: 0 % (ref 0.0–0.2)

## 2021-04-12 LAB — BASIC METABOLIC PANEL
Anion gap: 10 (ref 5–15)
BUN: 10 mg/dL (ref 8–23)
CO2: 22 mmol/L (ref 22–32)
Calcium: 8 mg/dL — ABNORMAL LOW (ref 8.9–10.3)
Chloride: 107 mmol/L (ref 98–111)
Creatinine, Ser: 1.02 mg/dL (ref 0.61–1.24)
GFR, Estimated: >60 mL/min (ref >60)
Glucose, Bld: 88 mg/dL (ref 70–99)
Potassium: 3 mmol/L — ABNORMAL LOW (ref 3.5–5.1)
Sodium: 139 mmol/L (ref 135–145)

## 2021-04-12 LAB — GLUCOSE, CAPILLARY: Glucose-Capillary: 95 mg/dL (ref 70–99)

## 2021-04-12 MED ORDER — CARVEDILOL 25 MG PO TABS
25.0000 mg | ORAL_TABLET | Freq: Two times a day (BID) | ORAL | 5 refills | Status: DC
Start: 2021-04-12 — End: 2021-10-14
  Filled 2021-04-12: qty 60, 30d supply, fill #0

## 2021-04-12 MED ORDER — GUAIFENESIN-DM 100-10 MG/5ML PO SYRP
5.0000 mL | ORAL_SOLUTION | ORAL | Status: DC | PRN
  Administered 2021-04-12: 5 mL via ORAL
  Filled 2021-04-12: qty 5

## 2021-04-12 NOTE — Discharge Summary (Addendum)
ELECTROPHYSIOLOGY PROCEDURE DISCHARGE SUMMARY    Patient ID: Joshua Mcmurtry.,  MRN: [id number], DOB/AGE: [birth year] 69 y.o.  Admit date: 04/11/2021 Discharge date: 04/12/2021  Primary Care Physician: Janie Morning, DO  Electrophysiologist: Dre. Neil Errickson  Primary Discharge Diagnosis:  Symptomatic bradycardia, 2:1 AVblock status post pacemaker implantation this admission  Secondary Discharge Diagnosis:  HTN HLD DM HIV+ Hemochromatosis CKD (III) Chronic back pain  Allergies  Allergen Reactions   Hydromorphone Itching and Rash   Scopolamine     GIVEN WITH A SURGERY YRS AGO--CAUSED HALLUNCINATIONS Other reaction(s): Confusion/Altered Mental Status Given during Surgery many years ago - Caused Hallucinations GIVEN WITH A SURGERY YRS AGO--CAUSED HALLUNCINATIONS GIVEN WITH A SURGERY YRS AGO--CAUSED HALLUNCINATIONS   Beta Adrenergic Blockers Other (See Comments)    Wenchebach--slowed heart rhythm    Escitalopram Oxalate Rash and Other (See Comments)    lightheaded,  visual  disturbances   Metformin Hcl Diarrhea and Nausea Only    Patient is tolerating    Codeine Itching   Meperidine Hcl Itching   Morphine Sulfate Rash     Procedures This Admission:  1.  Implantation of a MDT dual chamber PPM on 04/11/21 by Dr Curt Bears.  The patient received  Medtronic Azure XT DR MRI SureScan (serial number [id number]) pacemaker,  Medtronic model [id number] (serial number [id number]) right atrial lead and a Medtronic model 9983 (serial number [id number]) right ventricular lead There were no immediate post procedure complications. 2.  CXR on 04/11/21 post procedure demonstrated no pneumothorax status post device implantation.   Brief HPI: Joshua Serpa. is a 71 y.o. male is followed by our service out patient initially w/asymptomatic bradycardia though did developed advanced heart block with symptoms and recommended PPM Past medical history includes above.  The patient has had symptomatic  bradycardia without reversible causes identified.  Risks, benefits, and alternatives to PPM implantation were reviewed with the patient who wished to proceed.   Hospital Course:  The patient was admitted and underwent implantation of a PPM with details as outlined above.   He was initially planned to discharge same day though, developed significant HTN, some lightheadedness and kept for BP management and observation.  With BP meds his BP improved.   He was monitored on telemetry overnight which demonstrated SR/VP 90's.  Left chest was without hematoma or ecchymosis. Back has flat non pustular rash, no itching, suspect irritation from skin/shave prep.   The device was interrogated and found to be functioning normally.  CXR was obtained and demonstrated no pneumothorax status post device implantation.  Wound care, arm mobility, and restrictions were reviewed with the patient.  The patient feels well, denies any P/SOB, with minimal site discomfort.  He was examined by Dr. Curt Bears and considered stable for discharge to home.   Increase Coreg to 25mg  BID   Physical Exam: Vitals:   04/11/21 2020 04/11/21 2105 04/12/21 0346 04/12/21 0848  BP: (!) 191/81 (!) 162/79 (!) 147/76 (!) 159/75  Pulse: (!) 109 97 (P) 92 90  Resp: 17 (!) 25 (!) 21 (!) 21  Temp:  98 F (36.7 C) (P) 98 F (36.7 C) 97.6 F (36.4 C)  TempSrc:  Oral (P) Oral Oral  SpO2: 96% 95% (P) 95% 95%  Weight:   (P) 97.9 kg   Height:        GEN- The patient is well appearing, alert and oriented x 3 today.   HEENT: normocephalic, atraumatic; sclera clear, conjunctiva pink; hearing intact; oropharynx clear; neck  supple, no JVP Lungs- CTA b/l, normal work of breathing.  No wheezes, rales, rhonchi Heart- RRR, no murmurs, rubs or gallops, PMI not laterally displaced GI- soft, non-tender, non-distended Extremities- no clubbing, cyanosis, or edema MS- no significant deformity or atrophy Skin- warm and dry, no rash or lesion, left chest  without hematoma/ecchymosis Psych- euthymic mood, full affect Neuro- no gross deficits   Labs:   Lab Results  Component Value Date   WBC 9.1 04/12/2021   HGB 15.6 04/12/2021   HCT 45.6 04/12/2021   MCV 95.2 04/12/2021   PLT 199 04/12/2021    Recent Labs  Lab 04/12/21 0303  NA 139  K 3.0*  CL 107  CO2 22  BUN 10  CREATININE 1.02  CALCIUM 8.0*  GLUCOSE 88    Discharge Medications:  Allergies as of 04/12/2021       Reactions   Hydromorphone Itching, Rash   Scopolamine    GIVEN WITH A SURGERY YRS AGO--CAUSED HALLUNCINATIONS Other reaction(s): Confusion/Altered Mental Status Given during Surgery many years ago - Caused Hallucinations GIVEN WITH A SURGERY YRS AGO--CAUSED HALLUNCINATIONS GIVEN WITH A SURGERY YRS AGO--CAUSED HALLUNCINATIONS   Beta Adrenergic Blockers Other (See Comments)   Wenchebach--slowed heart rhythm    Escitalopram Oxalate Rash, Other (See Comments)   lightheaded,  visual  disturbances   Metformin Hcl Diarrhea, Nausea Only   Patient is tolerating    Codeine Itching   Meperidine Hcl Itching   Morphine Sulfate Rash        Medication List     TAKE these medications    acetaminophen 500 MG tablet Commonly known as: TYLENOL Take 1,000 mg by mouth every 6 (six) hours as needed (for pain.).   allopurinol 300 MG tablet Commonly known as: ZYLOPRIM Take 300 mg by mouth in the morning.   amLODipine 5 MG tablet Commonly known as: NORVASC Take 1 tablet (5 mg total) by mouth daily.   atorvastatin 10 MG tablet Commonly known as: LIPITOR Take 10 mg by mouth at bedtime.   BD Pen Needle Nano 2nd Gen 32G X 4 MM Misc Generic drug: Insulin Pen Needle Inject into the skin daily.   carvedilol 25 MG tablet Commonly known as: Coreg Take 1 tablet (25 mg total) by mouth 2 (two) times daily with a meal.   colchicine 0.6 MG tablet Take 0.6 mg by mouth as needed (gout).   dextromethorphan-guaiFENesin 30-600 MG 12hr tablet Commonly known as: MUCINEX  DM Take 1 tablet by mouth 2 (two) times daily as needed (cough/congestion.).   Farxiga 10 MG Tabs tablet Generic drug: dapagliflozin propanediol Take 10 mg by mouth in the morning.   fexofenadine 180 MG tablet Commonly known as: ALLEGRA Take 180 mg by mouth in the morning.   furosemide 20 MG tablet Commonly known as: LASIX Take 20 mg by mouth in the morning.   gabapentin 300 MG capsule Commonly known as: NEURONTIN TAKE ONE CAPSULE BY MOUTH TWICE DAILY What changed:  how much to take when to take this additional instructions   hydrALAZINE 50 MG tablet Commonly known as: APRESOLINE Take 75 mg by mouth 3 (three) times daily.   icosapent Ethyl 1 g capsule Commonly known as: VASCEPA Take 2 g by mouth 2 (two) times daily.   metFORMIN 1000 MG tablet Commonly known as: GLUCOPHAGE Take 1,000 mg by mouth 2 (two) times daily with a meal.   Odefsey 200-25-25 MG Tabs tablet Generic drug: emtricitabine-rilpivir-tenofovir AF Take 1 tablet by mouth daily with breakfast. What changed:  when to take this   OneTouch Verio test strip Generic drug: glucose blood use to test three times daily as needed   Soliqua 100-33 UNT-MCG/ML Sopn Generic drug: Insulin Glargine-Lixisenatide Inject 60 Units into the skin in the morning. Injects 60 units daily   telmisartan 80 MG tablet Commonly known as: MICARDIS Take 80 mg by mouth daily before breakfast.   testosterone cypionate 200 MG/ML injection Commonly known as: DEPOTESTOSTERONE CYPIONATE Inject 200 mg into the muscle every 14 (fourteen) days.        Disposition: Home Discharge Instructions     Diet - low sodium heart healthy   Complete by: As directed    Increase activity slowly   Complete by: As directed         Duration of Discharge Encounter: Greater than 30 minutes including physician time.  Joshua Night, PA-C 04/12/2021 9:47 AM   I have seen and examined this patient with [name].  Agree with above,  note added to reflect my findings.  On exam, RRR, no murmurs.  She is now status post Medtronic pacemaker for second degree AV block.  Device functioning appropriately.  Chest x-ray and interrogation without issue.  Plan for discharge today with follow-up in device clinic.  Monquie Fulgham M. Yeiren Whitecotton MD 04/15/2021 2:22 PM

## 2021-04-12 NOTE — TOC Initial Note (Signed)
Transition of Care (TOC) - Initial/Assessment Note    Patient Details  Name: [name]. MRN: [id number] Date of Birth: 1951/02/07  Transition of [facility]) CM/SW Contact:    Verdell Carmine, RN Phone Number: 04/12/2021, 9:44 AM  Clinical Narrative:                    The Transition of Care Department [facility]) has reviewed patient and no TOC needs have been identified at this time. We will continue to monitor patient advancement through interdisciplinary progression rounds. If new patient transition needs arise, please place a TOC consult      Patient Goals and CMS Choice        Expected Discharge Plan and Services           Expected Discharge Date: 04/11/21                                    Prior Living Arrangements/Services                       Activities of Daily Living Home Assistive Devices/Equipment: None ADL Screening (condition at time of admission) Patient's cognitive ability adequate to safely complete daily activities?: Yes Is the patient deaf or have difficulty hearing?: No Does the patient have difficulty seeing, even when wearing glasses/contacts?: No Does the patient have difficulty concentrating, remembering, or making decisions?: No Patient able to express need for assistance with ADLs?: Yes Does the patient have difficulty dressing or bathing?: No Independently performs ADLs?: Yes (appropriate for developmental age) Does the patient have difficulty walking or climbing stairs?: No Weakness of Legs: None Weakness of Arms/Hands: None  Permission Sought/Granted                  Emotional Assessment              Admission diagnosis:  Shortness of breath [R06.02] Patient Active Problem List   Diagnosis Date Noted   Shortness of breath 04/11/2021   Diabetic renal disease ([place]) 07/24/2020   Low testosterone 07/24/2020   Malignant hypertensive chronic kidney disease 07/24/2020   Cardiac arrhythmia 04/12/2020    ED (erectile dysfunction) of organic origin 04/12/2020   Polyneuropathy due to type 2 diabetes mellitus ([place]) 04/12/2020   Impingement syndrome of left shoulder region 12/26/2019   Abnormal EKG 12/19/2019   AV block, [place] 1 12/19/2019   Bilateral thumb pain 11/02/2018   Lumbar pain 09/15/2018   Diabetic peripheral neuropathy ([place]) 08/31/2018   Osteoarthritis of carpometacarpal (CMC) joint of thumb 08/31/2017   PTSD (post-traumatic stress disorder) 05/05/2016   CKD (chronic kidney disease) 04/29/2016   Hemochromatosis 04/29/2016   History of adenomatous polyp of colon 04/29/2016   NAFLD (nonalcoholic fatty liver disease) 04/29/2016   Obesity 04/29/2016   Chronic gout due to renal impairment of multiple sites without tophus 04/29/2016   Hypoxia 12/29/2015   Pulmonary edema 12/29/2015   UTI (urinary tract infection) 12/29/2015   Insomnia 09/24/2015   Gilbert's disease 03/20/2015   H/O partial nephrectomy 03/20/2015   H/O partial adrenalectomy ([place]) 03/20/2015   Encounter for long-term (current) use of other medications 05/24/2013   Hypogonadism male 05/13/2013   Other malaise and fatigue 05/11/2013   Metatarsal deformity 02/16/2013   Onychomycosis 02/16/2013   HAV (hallux abducto valgus) 02/16/2013   Bunion 02/16/2013   Hyperlipidemia 02/05/2011   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  Diabetes mellitus type 2 with complications ([place]) 01/24/3566   GOUT 06/22/2006   Human immunodeficiency virus (HIV) disease ([place]) 12/15/2005   Essential hypertension 12/15/2005   HEMOCHROMATOSIS, HX OF 12/15/2005   PCP:  Janie Morning, DO Pharmacy:   [id number] - [place], [place] - [address] AT [address] [place] [address] Phone: [phone] Fax: [phone]  [place], [place] [address] Phone: [phone] Fax: [phone]     Social  Determinants of Health (SDOH) Interventions    Readmission Risk Interventions No flowsheet data found.

## 2021-04-25 ENCOUNTER — Other Ambulatory Visit: Payer: Self-pay

## 2021-04-25 ENCOUNTER — Ambulatory Visit (INDEPENDENT_AMBULATORY_CARE_PROVIDER_SITE_OTHER): Payer: BC Managed Care – PPO

## 2021-04-25 DIAGNOSIS — I441 Atrioventricular block, second degree: Secondary | ICD-10-CM

## 2021-04-25 LAB — CUP PACEART INCLINIC DEVICE CHECK
Battery Remaining Longevity: 142 mo
Battery Voltage: 3.2 V
Brady Statistic AP VP Percent: 1.42 %
Brady Statistic AP VS Percent: 0 %
Brady Statistic AS VP Percent: 98.56 %
Brady Statistic AS VS Percent: 0.02 %
Brady Statistic RA Percent Paced: 1.41 %
Brady Statistic RV Percent Paced: 99.98 %
Date Time Interrogation Session: 20230216151559
Implantable Lead Implant Date: 20230202
Implantable Lead Implant Date: 20230202
Implantable Lead Location: 753859
Implantable Lead Location: 753860
Implantable Lead Model: 3830
Implantable Lead Model: 5076
Implantable Pulse Generator Implant Date: 20230202
Lead Channel Impedance Value: 418 Ohm
Lead Channel Impedance Value: 437 Ohm
Lead Channel Impedance Value: 608 Ohm
Lead Channel Impedance Value: 722 Ohm
Lead Channel Pacing Threshold Amplitude: 0.5 V
Lead Channel Pacing Threshold Amplitude: 1 V
Lead Channel Pacing Threshold Pulse Width: 0.4 ms
Lead Channel Pacing Threshold Pulse Width: 0.4 ms
Lead Channel Sensing Intrinsic Amplitude: 3.75 mV
Lead Channel Sensing Intrinsic Amplitude: 6.5 mV
Lead Channel Setting Pacing Amplitude: 3.5 V
Lead Channel Setting Pacing Amplitude: 3.5 V
Lead Channel Setting Pacing Pulse Width: 0.4 ms
Lead Channel Setting Sensing Sensitivity: 1.2 mV

## 2021-04-25 NOTE — Patient Instructions (Signed)

## 2021-04-25 NOTE — Progress Notes (Signed)
Wound check appointment. Steri-strips removed. Wound without redness or edema. Incision edges approximated, wound well healed. Normal device function. Thresholds, sensing, and impedances consistent with implant measurements. Device programmed at 3.5V for extra safety margin until 3 month visit. Histogram distribution appropriate for patient and level of activity. No mode switches or high ventricular rates noted. Patient educated about wound care, arm mobility, lifting restrictions. Patient is enrolled in remote monitoring, next scheduled check 07/16/21.  ROV with Dr. Curt Bears on 08/01/21.

## 2021-04-26 DIAGNOSIS — E291 Testicular hypofunction: Secondary | ICD-10-CM | POA: Diagnosis not present

## 2021-04-29 ENCOUNTER — Encounter: Payer: Self-pay | Admitting: *Deleted

## 2021-04-29 DIAGNOSIS — Z0279 Encounter for issue of other medical certificate: Secondary | ICD-10-CM

## 2021-05-01 ENCOUNTER — Telehealth: Payer: Self-pay

## 2021-05-01 ENCOUNTER — Other Ambulatory Visit (HOSPITAL_COMMUNITY): Payer: Self-pay

## 2021-05-01 NOTE — Telephone Encounter (Signed)
Carelink alert received- Presenting rhythm flutter with CVR ongoing from 2/21 @ 12:40   Pt does not have documented history of AF/ not on [place].    Attempted to reach patient to assess and have send follow-up transmission.  No answer.  LVM with device clinic # and hours to return call.

## 2021-05-02 NOTE — Telephone Encounter (Signed)
MyChart Message sent to patient.

## 2021-05-02 NOTE — Telephone Encounter (Signed)
Follow up transmission : Normal Sinus Rhythm AF episode 1hr34min in duration.

## 2021-05-03 DIAGNOSIS — M109 Gout, unspecified: Secondary | ICD-10-CM | POA: Diagnosis not present

## 2021-05-03 DIAGNOSIS — E785 Hyperlipidemia, unspecified: Secondary | ICD-10-CM | POA: Diagnosis not present

## 2021-05-03 DIAGNOSIS — I129 Hypertensive chronic kidney disease with stage 1 through stage 4 chronic kidney disease, or unspecified chronic kidney disease: Secondary | ICD-10-CM | POA: Diagnosis not present

## 2021-05-03 DIAGNOSIS — Z125 Encounter for screening for malignant neoplasm of prostate: Secondary | ICD-10-CM | POA: Diagnosis not present

## 2021-05-10 DIAGNOSIS — E291 Testicular hypofunction: Secondary | ICD-10-CM | POA: Diagnosis not present

## 2021-05-10 DIAGNOSIS — Z950 Presence of cardiac pacemaker: Secondary | ICD-10-CM | POA: Diagnosis not present

## 2021-05-10 DIAGNOSIS — I129 Hypertensive chronic kidney disease with stage 1 through stage 4 chronic kidney disease, or unspecified chronic kidney disease: Secondary | ICD-10-CM | POA: Diagnosis not present

## 2021-05-10 DIAGNOSIS — Z0000 Encounter for general adult medical examination without abnormal findings: Secondary | ICD-10-CM | POA: Diagnosis not present

## 2021-05-24 DIAGNOSIS — Z13820 Encounter for screening for osteoporosis: Secondary | ICD-10-CM | POA: Diagnosis not present

## 2021-05-24 DIAGNOSIS — E291 Testicular hypofunction: Secondary | ICD-10-CM | POA: Diagnosis not present

## 2021-05-27 ENCOUNTER — Other Ambulatory Visit: Payer: Self-pay | Admitting: Infectious Diseases

## 2021-05-27 DIAGNOSIS — B20 Human immunodeficiency virus [HIV] disease: Secondary | ICD-10-CM

## 2021-05-30 DIAGNOSIS — K635 Polyp of colon: Secondary | ICD-10-CM | POA: Diagnosis not present

## 2021-05-30 DIAGNOSIS — C184 Malignant neoplasm of transverse colon: Secondary | ICD-10-CM | POA: Diagnosis not present

## 2021-05-30 DIAGNOSIS — Z86010 Personal history of colonic polyps: Secondary | ICD-10-CM | POA: Diagnosis not present

## 2021-05-30 DIAGNOSIS — D120 Benign neoplasm of cecum: Secondary | ICD-10-CM | POA: Diagnosis not present

## 2021-05-30 DIAGNOSIS — Z1211 Encounter for screening for malignant neoplasm of colon: Secondary | ICD-10-CM | POA: Diagnosis not present

## 2021-05-30 DIAGNOSIS — D123 Benign neoplasm of transverse colon: Secondary | ICD-10-CM | POA: Diagnosis not present

## 2021-05-30 DIAGNOSIS — Z800 Family history of malignant neoplasm of digestive organs: Secondary | ICD-10-CM | POA: Diagnosis not present

## 2021-05-30 DIAGNOSIS — R1310 Dysphagia, unspecified: Secondary | ICD-10-CM | POA: Diagnosis not present

## 2021-05-30 DIAGNOSIS — K219 Gastro-esophageal reflux disease without esophagitis: Secondary | ICD-10-CM | POA: Diagnosis not present

## 2021-06-03 ENCOUNTER — Telehealth: Payer: Self-pay | Admitting: Cardiology

## 2021-06-03 NOTE — Telephone Encounter (Signed)
FMLA form received from [employer] [place], placed in Dr. Macky Lower box for completion.  ?06/03/21  KLM  ?

## 2021-06-07 DIAGNOSIS — E291 Testicular hypofunction: Secondary | ICD-10-CM | POA: Diagnosis not present

## 2021-06-07 DIAGNOSIS — C184 Malignant neoplasm of transverse colon: Secondary | ICD-10-CM | POA: Diagnosis not present

## 2021-06-10 DIAGNOSIS — C189 Malignant neoplasm of colon, unspecified: Secondary | ICD-10-CM | POA: Diagnosis not present

## 2021-06-11 ENCOUNTER — Encounter: Payer: Self-pay | Admitting: Cardiology

## 2021-06-11 ENCOUNTER — Encounter: Payer: Self-pay | Admitting: Infectious Diseases

## 2021-06-13 ENCOUNTER — Telehealth: Payer: Self-pay

## 2021-06-13 ENCOUNTER — Ambulatory Visit (INDEPENDENT_AMBULATORY_CARE_PROVIDER_SITE_OTHER): Payer: BC Managed Care – PPO | Admitting: Infectious Diseases

## 2021-06-13 ENCOUNTER — Other Ambulatory Visit: Payer: Self-pay

## 2021-06-13 DIAGNOSIS — Z113 Encounter for screening for infections with a predominantly sexual mode of transmission: Secondary | ICD-10-CM | POA: Diagnosis not present

## 2021-06-13 DIAGNOSIS — E1142 Type 2 diabetes mellitus with diabetic polyneuropathy: Secondary | ICD-10-CM

## 2021-06-13 DIAGNOSIS — Z79899 Other long term (current) drug therapy: Secondary | ICD-10-CM

## 2021-06-13 DIAGNOSIS — I10 Essential (primary) hypertension: Secondary | ICD-10-CM | POA: Diagnosis not present

## 2021-06-13 DIAGNOSIS — E1121 Type 2 diabetes mellitus with diabetic nephropathy: Secondary | ICD-10-CM

## 2021-06-13 DIAGNOSIS — I441 Atrioventricular block, second degree: Secondary | ICD-10-CM | POA: Diagnosis not present

## 2021-06-13 DIAGNOSIS — B20 Human immunodeficiency virus [HIV] disease: Secondary | ICD-10-CM

## 2021-06-13 NOTE — Telephone Encounter (Signed)
? ?  Pre-operative Risk Assessment  ?  ?Patient Name: [name].  ?DOB: 1950/07/03 ?MRN: [id number]  ? ?  ? ?Request for Surgical Clearance   ? ?Procedure:   Laparoscopic Extended Right Hemicolectomy ? ?Date of Surgery:  Clearance 07/18/21                              ?   ?Surgeon:  Not listed ?Surgeon's Group or Practice Name:  [facility] ?Phone number:  [phone] ?Fax number:  [phone] ?  ?Type of Clearance Requested:   ?- Medical - Pacemaker ?  ?Type of Anesthesia:  General  ?  ?Additional requests/questions:   Pt had Pacemaker placed on 04/11/2021 ? ?Signed, ?Aniket Paye   ?06/13/2021, 3:32 PM  ? ?

## 2021-06-13 NOTE — Assessment & Plan Note (Signed)
States his BP has been well controlled since PPM.  ?monitors at home.  ?

## 2021-06-13 NOTE — Assessment & Plan Note (Signed)
Appreciate CV f/u.  ?Appears to be doing well.  ?

## 2021-06-13 NOTE — Assessment & Plan Note (Addendum)
A1C is well controlled.  ?Continues on metformin.  ?Appreciate his PCP f/u.  ?

## 2021-06-13 NOTE — Assessment & Plan Note (Signed)
Will check his labs.  ?

## 2021-06-13 NOTE — Assessment & Plan Note (Signed)
Believe he is doing well.  ?Will check his labs so that they will be available to his surgeon.  ?rtc in 6-9 months.  ?

## 2021-06-13 NOTE — Progress Notes (Signed)
? ?  Subjective:  ? ? Patient ID: [name]., male  DOB: [birth year], 71 y.o.        MRN: [id number] ? ? ?HPI ?71 yo M with hx of hemochromatosis, DM2 (dx 2007), secondary hypogonadism (on clomid), CKD3, hyperlipidemia, lumbar laminectomy/decompression (04-2011), and HIV+ since 1990 when he was dx on insurance physical. He has been maintained on atripla --> changed to [facility] 2017.  ?  ?He is completely COVID vaccinated. He had cataract surgery in Jan 2022.  ?  ?He has been doing well with his DM- last A1C was 5.6%. Back on metformin. Gives him significant GI upset. Sees [facility].  ?No problems with odefsey, takes after at HS.  ?Sees, ophtho (has floater), podiatry, renal,  yearly. ?Monitors BP at home, have been nl ? ?Had routine colon done after his PPM, found to have colon cancer. He is to have colectomy in May.  ?CD4 443 (01-2021).  ? ?HIV 1 RNA Quant  ?Date Value  ?01/30/2021 Not Detected Copies/mL  ?05/01/2020 <20 Copies/mL  ?05/19/2019 <20 NOT DETECTED copies/mL  ? ?CD4 T Cell Abs (/uL)  ?Date Value  ?05/01/2020 549  ?05/19/2019 858  ?04/15/2018 600  ? ? ? ?Health Maintenance  ?Topic Date Due  ? HEMOGLOBIN A1C  09/28/2017  ? OPHTHALMOLOGY EXAM  02/07/2018  ? COVID-19 Vaccine (4 - Booster for Moderna series) 12/31/2019  ? FOOT EXAM  07/24/2021  ? INFLUENZA VACCINE  10/08/2021  ? COLONOSCOPY (Pts 45-56yr Insurance coverage will need to be confirmed)  05/28/2026  ? TETANUS/TDAP  04/16/2027  ? Pneumonia Vaccine 69 Years old  Completed  ? Hepatitis C Screening  Completed  ? Zoster Vaccines- Shingrix  Completed  ? HPV VACCINES  Aged Out  ? ? ? ? ?Review of Systems  ?All other systems reviewed and are negative. ? ?Please see HPI. All other systems reviewed and negative. ? ?   ?Objective:  ?Physical Exam ? ? ?1. Due to the national emergency this service was provided using telemedicine. phone.  ?2. Consent from the patient for the telehealth visit and that you identified patient.   ?3. Your locations, Pt and Provider  home, [facility].  ?4. Chief complaint for visit: concern over cancer screening.   ?5. Document anyone else on the call:none  ?6. If the visit was a phone call, that you include the time you spent on the call: <10 minutes.  ? ? ? ? ?   ?Assessment & Plan:  ? ?

## 2021-06-14 ENCOUNTER — Encounter: Payer: Self-pay | Admitting: Cardiology

## 2021-06-14 NOTE — Telephone Encounter (Signed)
S/w the pt and we moved up his appt per pre op provider. See notes. Appt is now set with Oda Kilts, [facility] 07/05/21 @ 8 am. Pt thanked me for the call and the help. I will update the requesting office the pt has appt 07/05/21 for pre op assessment ?

## 2021-06-14 NOTE — Telephone Encounter (Signed)
? ?  Name: Bodin Gorka.  ?DOB: June 28, 1950  ?MRN: [id number] ? ?Primary Cardiologist: Patwhardan/ Camnitz (EP) ? ?Chart reviewed as part of pre-operative protocol coverage. Because of Yoskar Murrillo Jr.'s past medical history and time since last visit, he will require a follow-up visit in order to better assess preoperative cardiovascular risk.  ? ?Pre-op covering staff: ?- Please schedule appointment with EP APP or MD as soon as possible (surgery scheduled for 07/18/2021) and call patient to inform them. If patient already had an upcoming appointment within acceptable timeframe, please add "pre-op clearance" to the appointment notes so provider is aware. ?- Please contact requesting surgeon's office via preferred method (i.e, phone, fax) to inform them of need for appointment prior to surgery. ? ?If applicable, this message will also be routed to pharmacy pool and/or primary cardiologist for input on holding anticoagulant/antiplatelet agent as requested below so that this information is available to the clearing provider at time of patient's appointment.  ? ?Lenna Sciara, NP  ?06/14/2021, 3:15 PM  ? ?

## 2021-06-14 NOTE — Progress Notes (Signed)
PERIOPERATIVE PRESCRIPTION FOR IMPLANTED CARDIAC DEVICE PROGRAMMING ? ?Patient Information: ?Name:  [name].  ?DOB:  08/25/1950  ?MRN:  [id number]  ?  ?Procedure:   Laparoscopic Extended Right Hemicolectomy ?  ?Date of Surgery:  Clearance 07/18/21                              ?   ?Surgeon:  Not listed ?Surgeon's Group or Practice Name:  [facility] ?Phone number:  [phone] ?Fax number:  [phone] ?  ?Type of Clearance Requested:   ?- Medical - Pacemaker ?  ?Type of Anesthesia:  General  ?Device Information: ? ?Clinic EP Physician:  Allegra Lai, MD  ? ?Device Type:  Pacemaker ?Manufacturer and Phone #:  Medtronic: [id number] ?Pacemaker Dependent?:  Yes.   ?Date of Last Device Check:  05/02/2021 Normal Device Function?:  Yes.   ? ?Electrophysiologist's Recommendations: ? ?Have magnet available. ?Provide continuous ECG monitoring when magnet is used or reprogramming is to be performed.  ?Procedure may interfere with device function.  Magnet should be placed over device during procedure. ? ?Per Device Clinic Standing Orders, ?Wanda Plump, RN  ?9:59 AM 06/14/2021  ?

## 2021-06-14 NOTE — Telephone Encounter (Signed)
? ?  Patient Name: [name].  ?DOB: [birth year] ?MRN: [id number] ? ?Primary Cardiologist: None ? ?Chart reviewed as part of pre-operative protocol coverage.  ? ?71 year old male with a history of second degree AV block Mobitz type 1 s/p PPM 04/2021, hypertension, hyperlipidemia, type 2 diabetes, HIV, colon cancer, and hemochromatosis. ? ?Echo in 2021 showed EF 71%, mild mitral and tricuspid valve regurgitation.  He was last seen in the office on 04/09/2021. He underwent PPM implant on 04/11/2021. He has not been seen in the office since. ? ?He was recently diagnosed with colon cancer during routine colonoscopy.  We received a clearance request for laparoscopic extended right hemicolectomy scheduled for 07/18/2021..  ? ?Dr. Curt Bears, since you most recently saw this patient, are you comfortable providing medical clearance for this procedure, or would you prefer he be seen in the office prior to surgery?  Please advise. ? ?Please route your response to P CV DIV PREOP.  ? ?Thank you.  ? ?Lenna Sciara, NP ?06/14/2021, 1:56 PM ? ?

## 2021-06-21 ENCOUNTER — Other Ambulatory Visit (HOSPITAL_COMMUNITY)
Admission: RE | Admit: 2021-06-21 | Discharge: 2021-06-21 | Disposition: A | Discharge status: Home / Self Care | Payer: BC Managed Care – PPO | Source: Ambulatory Visit | Attending: Infectious Diseases | Admitting: Infectious Diseases

## 2021-06-21 ENCOUNTER — Other Ambulatory Visit: Payer: BC Managed Care – PPO

## 2021-06-21 ENCOUNTER — Other Ambulatory Visit: Payer: Self-pay

## 2021-06-21 DIAGNOSIS — B20 Human immunodeficiency virus [HIV] disease: Secondary | ICD-10-CM

## 2021-06-21 DIAGNOSIS — E1142 Type 2 diabetes mellitus with diabetic polyneuropathy: Secondary | ICD-10-CM

## 2021-06-21 DIAGNOSIS — Z113 Encounter for screening for infections with a predominantly sexual mode of transmission: Secondary | ICD-10-CM

## 2021-06-21 DIAGNOSIS — Z79899 Other long term (current) drug therapy: Secondary | ICD-10-CM

## 2021-06-21 DIAGNOSIS — Z125 Encounter for screening for malignant neoplasm of prostate: Secondary | ICD-10-CM | POA: Diagnosis not present

## 2021-06-21 DIAGNOSIS — E291 Testicular hypofunction: Secondary | ICD-10-CM | POA: Diagnosis not present

## 2021-06-24 LAB — URINE CYTOLOGY ANCILLARY ONLY
Chlamydia: NEGATIVE
Comment: NEGATIVE
Comment: NORMAL
Neisseria Gonorrhea: NEGATIVE

## 2021-06-25 ENCOUNTER — Encounter: Payer: Self-pay | Admitting: Infectious Diseases

## 2021-06-27 LAB — LIPID PANEL
Cholesterol: 74 mg/dL (ref <200)
HDL: 39 mg/dL — ABNORMAL LOW (ref >=40)
LDL Cholesterol (Calc): <10 mg/dL (calc)
Non-HDL Cholesterol (Calc): 35 mg/dL (calc) (ref <130)
Total CHOL/HDL Ratio: 1.9 (calc) (ref <5.0)
Triglycerides: 303 mg/dL — ABNORMAL HIGH (ref <150)

## 2021-06-27 LAB — T-HELPER CELLS (CD4) COUNT (NOT AT ARMC)
Absolute CD4: 476 cells/uL — ABNORMAL LOW (ref 490–1740)
CD4 T Helper %: 15 % — ABNORMAL LOW (ref 30–61)
Total lymphocyte count: 3119 cells/uL (ref 850–3900)

## 2021-06-27 LAB — CBC
HCT: 47.8 % (ref 38.5–50.0)
Hemoglobin: 16.6 g/dL (ref 13.2–17.1)
MCH: 32.8 pg (ref 27.0–33.0)
MCHC: 34.7 g/dL (ref 32.0–36.0)
MCV: 94.5 fL (ref 80.0–100.0)
MPV: 10.8 fL (ref 7.5–12.5)
Platelets: 194 10*3/uL (ref 140–400)
RBC: 5.06 10*6/uL (ref 4.20–5.80)
RDW: 13.5 % (ref 11.0–15.0)
WBC: 8 10*3/uL (ref 3.8–10.8)

## 2021-06-27 LAB — COMPREHENSIVE METABOLIC PANEL
AG Ratio: 1.6 (calc) (ref 1.0–2.5)
ALT: 16 U/L (ref 9–46)
AST: 18 U/L (ref 10–35)
Albumin: 4.8 g/dL (ref 3.6–5.1)
Alkaline phosphatase (APISO): 91 U/L (ref 35–144)
BUN: 13 mg/dL (ref 7–25)
CO2: 27 mmol/L (ref 20–32)
Calcium: 9.1 mg/dL (ref 8.6–10.3)
Chloride: 105 mmol/L (ref 98–110)
Creat: 1.26 mg/dL (ref 0.70–1.35)
Globulin: 3 g/dL (calc) (ref 1.9–3.7)
Glucose, Bld: 95 mg/dL (ref 65–99)
Potassium: 4.4 mmol/L (ref 3.5–5.3)
Sodium: 144 mmol/L (ref 135–146)
Total Bilirubin: 2.6 mg/dL — ABNORMAL HIGH (ref 0.2–1.2)
Total Protein: 7.8 g/dL (ref 6.1–8.1)

## 2021-06-27 LAB — HIV-1 RNA QUANT-NO REFLEX-BLD
HIV 1 RNA Quant: NOT DETECTED copies/mL
HIV-1 RNA Quant, Log: NOT DETECTED Log copies/mL

## 2021-06-27 LAB — HEMOGLOBIN A1C
Hgb A1c MFr Bld: 5.4 % of total Hgb (ref <5.7)
Mean Plasma Glucose: 108 mg/dL
eAG (mmol/L): 6 mmol/L

## 2021-06-27 LAB — RPR: RPR Ser Ql: NONREACTIVE

## 2021-07-02 DIAGNOSIS — N182 Chronic kidney disease, stage 2 (mild): Secondary | ICD-10-CM | POA: Diagnosis not present

## 2021-07-02 DIAGNOSIS — R809 Proteinuria, unspecified: Secondary | ICD-10-CM | POA: Diagnosis not present

## 2021-07-02 DIAGNOSIS — N179 Acute kidney failure, unspecified: Secondary | ICD-10-CM | POA: Diagnosis not present

## 2021-07-02 DIAGNOSIS — I129 Hypertensive chronic kidney disease with stage 1 through stage 4 chronic kidney disease, or unspecified chronic kidney disease: Secondary | ICD-10-CM | POA: Diagnosis not present

## 2021-07-03 ENCOUNTER — Telehealth: Payer: Self-pay

## 2021-07-03 NOTE — Telephone Encounter (Signed)
Unscheduled transmission, normal function ?1 episode of AF/flutter, duration 22hrs on 4/24 controlled rates. Burden 1.5%, no OAC (last episode 1hr 66mn). Presenting NSR, ChadsVasc at least 3. ? ? ? ? ? ? ? ? ? ? ?

## 2021-07-03 NOTE — Progress Notes (Signed)
? ? ?Electrophysiology Office Note ?Date: 07/04/2021 ? ?ID:  [name]., DOB June 16, 1950, MRN [id number] ? ?PCP: Janie Morning, DO ?Primary Cardiologist: None ?Electrophysiologist: Will Meredith Leeds, MD  ? ?CC: Pacemaker follow-up ? ?[name]. is a 71 y.o. male seen today for Will Meredith Leeds, MD for cardiac clearance.  Since last being seen in our clinic the patient reports doing very well.  he denies chest pain, palpitations, dyspnea, PND, orthopnea, nausea, vomiting, dizziness, syncope, edema, weight gain, or early satiety. ? ?Device History: ?Medtronic Dual Chamber PPM implanted 04/2021 for symptomatic second degree AV block ? ?Past Medical History:  ?Diagnosis Date  ? Complication of anesthesia   ? ANESTHESIA AWARENESS  DURING ADRENALECTOMY AND COLONOSCOPY  ? Diabetes mellitus   ? on oral meds-no insulin  ? DIABETES MELLITUS, TYPE II, UNCONTROLLED 06/22/2006  ? GILBERT'S SYNDROME 02/25/2007  ? GOUT 06/22/2006  ? HEMOCHROMATOSIS, HX OF 12/15/2005  ? phlebotomy every 6 to 8 weeks--[facility]  ? HIV disease ([place]) 1990  ? [facility]  ? HNP (herniated nucleus pulposus)   ? LUMBAR PAIN WITH PAIN DOWN LEFT LEG TO TOES-SOME NUMBNESS AND TINGLING IN BIG TOE  ? Hypertension   ? ?Past Surgical History:  ?Procedure Laterality Date  ? ADRENALECTOMY    ? RIGHT ADRENALECTOMY   ? CHOLECYSTECTOMY    ? LUMBAR LAMINECTOMY/DECOMPRESSION MICRODISCECTOMY  04/23/2011  ? Procedure: LUMBAR LAMINECTOMY/DECOMPRESSION MICRODISCECTOMY;  Surgeon: Johnn Hai, MD;  Location: [facility];  Service: Orthopedics;  Laterality: N/A;  Decompression L5 S1 (X-Ray)  ? ORIF LEFT SHOULDER    ? STILL HAS HARDWARE IN THE SHOULDER  ? PACEMAKER IMPLANT N/A 04/11/2021  ? Procedure: PACEMAKER IMPLANT;  Surgeon: Constance Haw, MD;  Location: [place] [facility];  Service: Cardiovascular;  Laterality: N/A;  ? ? ?Current Outpatient Medications  ?Medication Sig Dispense Refill  ?  acetaminophen (TYLENOL) 500 MG tablet Take 1,000 mg by mouth every 6 (six) hours as needed (for pain.).    ? allopurinol (ZYLOPRIM) 300 MG tablet Take 300 mg by mouth in the morning.    ? amLODipine (NORVASC) 5 MG tablet Take 1 tablet (5 mg total) by mouth daily. 90 tablet 2  ? atorvastatin (LIPITOR) 10 MG tablet Take 10 mg by mouth at bedtime.    ? BD PEN NEEDLE NANO 2ND GEN 32G X 4 MM MISC Inject into the skin daily.    ? carvedilol (COREG) 25 MG tablet Take 1 tablet (25 mg total) by mouth 2 (two) times daily with a meal. 60 tablet 5  ? colchicine 0.6 MG tablet Take 0.6 mg by mouth as needed (gout).     ? dextromethorphan-guaiFENesin (MUCINEX DM) 30-600 MG 12hr tablet Take 1 tablet by mouth 2 (two) times daily as needed (cough/congestion.).    ? FARXIGA 10 MG TABS tablet Take 10 mg by mouth in the morning.    ? fexofenadine (ALLEGRA) 180 MG tablet Take 180 mg by mouth in the morning.    ? furosemide (LASIX) 20 MG tablet Take 20 mg by mouth in the morning.    ? gabapentin (NEURONTIN) 300 MG capsule TAKE ONE CAPSULE BY MOUTH TWICE DAILY (Patient taking differently: Take 300-600 mg by mouth See admin instructions. Take 1 capsule (300 mg) by mouth in the morning & take 2 capsules (600 mg) by mouth at night.) 30 capsule 1  ? glucose blood (ONETOUCH VERIO) test strip use to test three times daily  as needed    ? hydrALAZINE (APRESOLINE) 50 MG tablet Take 75 mg by mouth 3 (three) times daily.    ? icosapent Ethyl (VASCEPA) 1 g capsule Take 2 g by mouth 2 (two) times daily.    ? metFORMIN (GLUCOPHAGE) 1000 MG tablet Take 1,000 mg by mouth 2 (two) times daily with a meal.    ? ODEFSEY 200-25-25 MG TABS tablet TAKE ONE TABLET DAILY WITH BREAKFAST 30 tablet 5  ? SOLIQUA 100-33 UNT-MCG/ML SOPN Inject 60 Units into the skin in the morning. Injects 60 units daily  12  ? telmisartan (MICARDIS) 80 MG tablet Take 80 mg by mouth daily before breakfast.    ? testosterone cypionate (DEPOTESTOSTERONE CYPIONATE) 200 MG/ML injection  Inject 200 mg into the muscle every 14 (fourteen) days.    ? ?No current facility-administered medications for this visit.  ? ? ?Allergies:   Hydromorphone, Scopolamine, Beta adrenergic blockers, Escitalopram oxalate, Metformin hcl, Codeine, and Morphine sulfate  ? ?Social History: ?Social History  ? ?Socioeconomic History  ? Marital status: Single  ?  Spouse name: Not on file  ? Number of children: 0  ? Years of education: Not on file  ? Highest education level: Bachelor's degree (e.g., BA, AB, BS)  ?Occupational History  ? Not on file  ?Tobacco Use  ? Smoking status: Never  ? Smokeless tobacco: Never  ?Vaping Use  ? Vaping Use: Never used  ?Substance and Sexual Activity  ? Alcohol use: No  ?  Alcohol/week: 0.0 standard drinks  ? Drug use: No  ? Sexual activity: Never  ?  Partners: Male  ?  Comment: pt. declined condoms  ?Other Topics Concern  ? Not on file  ?Social History Narrative  ? Not on file  ? ?Social Determinants of Health  ? ?Financial Resource Strain: Low Risk   ? Difficulty of Paying Living Expenses: Not hard at all  ?Food Insecurity: No Food Insecurity  ? Worried About Charity fundraiser in the Last Year: Never true  ? Ran Out of Food in the Last Year: Never true  ?Transportation Needs: No Transportation Needs  ? Lack of Transportation (Medical): No  ? Lack of Transportation (Non-Medical): No  ?Physical Activity: Insufficiently Active  ? Days of Exercise per Week: 1 day  ? Minutes of Exercise per Session: 10 min  ?Stress: Stress Concern Present  ? Feeling of Stress : Rather much  ?Social Connections: Moderately Integrated  ? Frequency of Communication with Friends and Family: More than three times a week  ? Frequency of Social Gatherings with Friends and Family: Three times a week  ? Attends Religious Services: More than 4 times per year  ? Active Member of Clubs or Organizations: Yes  ? Attends Archivist Meetings: More than 4 times per year  ? Marital Status: Never married  ?Intimate  Partner Violence: Not on file  ? ? ?Family History: ?Family History  ?Problem Relation Age of Onset  ? Hypertension Mother   ? Hyperlipidemia Mother   ? Atrial fibrillation Mother   ? Hyperlipidemia Father   ? Hypertension Father   ? Atrial fibrillation Father   ? ? ? ?Review of Systems: ?All other systems reviewed and are otherwise negative except as noted above. ? ?Physical Exam: ?Vitals:  ? 07/04/21 0814  ?BP: 136/70  ?Pulse: 89  ?SpO2: 96%  ?Weight: 229 lb (103.9 kg)  ?Height: '5\' 9"'$  (1.753 m)  ?  ? ?GEN- The patient is well appearing, alert and  oriented x 3 today.   ?HEENT: normocephalic, atraumatic; sclera clear, conjunctiva pink; hearing intact; oropharynx clear; neck supple  ?Lungs- Clear to ausculation bilaterally, normal work of breathing.  No wheezes, rales, rhonchi ?Heart- Regular rate and rhythm, no murmurs, rubs or gallops  ?GI- soft, non-tender, non-distended, bowel sounds present  ?Extremities- no clubbing or cyanosis. No edema ?MS- no significant deformity or atrophy ?Skin- warm and dry, no rash or lesion; PPM pocket well healed ?Psych- euthymic mood, full affect ?Neuro- strength and sensation are intact ? ?PPM Interrogation- reviewed in detail today,  See PACEART report ? ?EKG:  EKG is ordered today. ?Personal review of ekg ordered today shows NSR at 89 bpm  ? ?Recent Labs: ?04/11/2021: B Natriuretic Peptide 196.1; TSH 0.822 ?06/21/2021: ALT 16; BUN 13; Creat 1.26; Hemoglobin 16.6; Platelets 194; Potassium 4.4; Sodium 144  ? ?Wt Readings from Last 3 Encounters:  ?07/04/21 229 lb (103.9 kg)  ?04/12/21 (P) 215 lb 14.4 oz (97.9 kg)  ?04/09/21 221 lb 12.8 oz (100.6 kg)  ?  ? ?Other studies Reviewed: ?Additional studies/ records that were reviewed today include: Previous EP office notes, Previous remote checks, Most recent labwork.  ? ?Assessment and Plan: ? ?1. Second Degree AV block s/p Medtronic PPM  ?Normal PPM function ?See Claudia Desanctis Art report ?No changes today ? ?2. Cardiac clearance for Laparoscopic  Extended Right Hemicolectomy ?Echo 11/25/2019 LVEF 71% ?No s/s concerning for ischemia or CHF.  ?Pt is at least intermediate risk for this intermediate risk procedure. OK to proceed without further cardiac work.  ?He is

## 2021-07-04 ENCOUNTER — Ambulatory Visit (INDEPENDENT_AMBULATORY_CARE_PROVIDER_SITE_OTHER): Payer: BC Managed Care – PPO | Admitting: Student

## 2021-07-04 ENCOUNTER — Encounter: Payer: Self-pay | Admitting: Student

## 2021-07-04 DIAGNOSIS — I441 Atrioventricular block, second degree: Secondary | ICD-10-CM | POA: Diagnosis not present

## 2021-07-04 DIAGNOSIS — I10 Essential (primary) hypertension: Secondary | ICD-10-CM

## 2021-07-04 DIAGNOSIS — Z01810 Encounter for preprocedural cardiovascular examination: Secondary | ICD-10-CM

## 2021-07-04 LAB — CUP PACEART INCLINIC DEVICE CHECK
Battery Remaining Longevity: 151 mo
Battery Voltage: 3.17 V
Brady Statistic AP VP Percent: 0.25 %
Brady Statistic AP VS Percent: 0 %
Brady Statistic AS VP Percent: 99.12 %
Brady Statistic AS VS Percent: 0.62 %
Brady Statistic RA Percent Paced: 0.3 %
Brady Statistic RV Percent Paced: 99.38 %
Date Time Interrogation Session: 20230427085042
Implantable Lead Implant Date: 20230202
Implantable Lead Implant Date: 20230202
Implantable Lead Location: 753859
Implantable Lead Location: 753860
Implantable Lead Model: 3830
Implantable Lead Model: 5076
Implantable Pulse Generator Implant Date: 20230202
Lead Channel Impedance Value: 361 Ohm
Lead Channel Impedance Value: 399 Ohm
Lead Channel Impedance Value: 551 Ohm
Lead Channel Impedance Value: 722 Ohm
Lead Channel Pacing Threshold Amplitude: 0.5 V
Lead Channel Pacing Threshold Amplitude: 0.875 V
Lead Channel Pacing Threshold Pulse Width: 0.4 ms
Lead Channel Pacing Threshold Pulse Width: 0.4 ms
Lead Channel Sensing Intrinsic Amplitude: 11.75 mV
Lead Channel Sensing Intrinsic Amplitude: 4.125 mV
Lead Channel Sensing Intrinsic Amplitude: 4.5 mV
Lead Channel Sensing Intrinsic Amplitude: 6 mV
Lead Channel Setting Pacing Amplitude: 1.5 V
Lead Channel Setting Pacing Amplitude: 2 V
Lead Channel Setting Pacing Pulse Width: 0.4 ms
Lead Channel Setting Sensing Sensitivity: 1.2 mV

## 2021-07-04 NOTE — Patient Instructions (Signed)

## 2021-07-05 ENCOUNTER — Ambulatory Visit: Payer: BC Managed Care – PPO | Admitting: Student

## 2021-07-08 NOTE — Addendum Note (Signed)
Addended by: Jordan Likes on: 07/08/2021 08:28 AM ? ? Modules accepted: Orders ? ?

## 2021-07-16 ENCOUNTER — Ambulatory Visit (INDEPENDENT_AMBULATORY_CARE_PROVIDER_SITE_OTHER): Payer: BC Managed Care – PPO

## 2021-07-16 DIAGNOSIS — Z950 Presence of cardiac pacemaker: Secondary | ICD-10-CM

## 2021-07-16 DIAGNOSIS — I441 Atrioventricular block, second degree: Secondary | ICD-10-CM | POA: Diagnosis not present

## 2021-07-16 LAB — CUP PACEART REMOTE DEVICE CHECK
Battery Remaining Longevity: 149 mo
Battery Voltage: 3.17 V
Brady Statistic AP VP Percent: 2.25 %
Brady Statistic AP VS Percent: 0 %
Brady Statistic AS VP Percent: 97 %
Brady Statistic AS VS Percent: 0.76 %
Brady Statistic RA Percent Paced: 2.58 %
Brady Statistic RV Percent Paced: 99.24 %
Date Time Interrogation Session: 20230508221219
Implantable Lead Implant Date: 20230202
Implantable Lead Implant Date: 20230202
Implantable Lead Location: 753859
Implantable Lead Location: 753860
Implantable Lead Model: 3830
Implantable Lead Model: 5076
Implantable Pulse Generator Implant Date: 20230202
Lead Channel Impedance Value: 342 Ohm
Lead Channel Impedance Value: 361 Ohm
Lead Channel Impedance Value: 513 Ohm
Lead Channel Impedance Value: 627 Ohm
Lead Channel Pacing Threshold Amplitude: 0.5 V
Lead Channel Pacing Threshold Amplitude: 0.75 V
Lead Channel Pacing Threshold Pulse Width: 0.4 ms
Lead Channel Pacing Threshold Pulse Width: 0.4 ms
Lead Channel Sensing Intrinsic Amplitude: 11.75 mV
Lead Channel Sensing Intrinsic Amplitude: 3.75 mV
Lead Channel Sensing Intrinsic Amplitude: 3.75 mV
Lead Channel Sensing Intrinsic Amplitude: 6 mV
Lead Channel Setting Pacing Amplitude: 1.5 V
Lead Channel Setting Pacing Amplitude: 2 V
Lead Channel Setting Pacing Pulse Width: 0.4 ms
Lead Channel Setting Sensing Sensitivity: 1.2 mV

## 2021-07-17 DIAGNOSIS — E291 Testicular hypofunction: Secondary | ICD-10-CM | POA: Diagnosis not present

## 2021-07-18 DIAGNOSIS — C184 Malignant neoplasm of transverse colon: Secondary | ICD-10-CM | POA: Diagnosis not present

## 2021-07-18 DIAGNOSIS — I340 Nonrheumatic mitral (valve) insufficiency: Secondary | ICD-10-CM | POA: Diagnosis not present

## 2021-07-18 DIAGNOSIS — E1140 Type 2 diabetes mellitus with diabetic neuropathy, unspecified: Secondary | ICD-10-CM | POA: Diagnosis not present

## 2021-07-18 DIAGNOSIS — K7581 Nonalcoholic steatohepatitis (NASH): Secondary | ICD-10-CM | POA: Diagnosis not present

## 2021-07-18 DIAGNOSIS — E230 Hypopituitarism: Secondary | ICD-10-CM | POA: Diagnosis not present

## 2021-07-18 DIAGNOSIS — K9189 Other postprocedural complications and disorders of digestive system: Secondary | ICD-10-CM | POA: Diagnosis not present

## 2021-07-18 DIAGNOSIS — Z79899 Other long term (current) drug therapy: Secondary | ICD-10-CM | POA: Diagnosis not present

## 2021-07-18 DIAGNOSIS — K567 Ileus, unspecified: Secondary | ICD-10-CM | POA: Diagnosis not present

## 2021-07-18 DIAGNOSIS — E1122 Type 2 diabetes mellitus with diabetic chronic kidney disease: Secondary | ICD-10-CM | POA: Diagnosis not present

## 2021-07-18 DIAGNOSIS — Z9049 Acquired absence of other specified parts of digestive tract: Secondary | ICD-10-CM | POA: Diagnosis not present

## 2021-07-18 DIAGNOSIS — E1142 Type 2 diabetes mellitus with diabetic polyneuropathy: Secondary | ICD-10-CM | POA: Diagnosis not present

## 2021-07-18 DIAGNOSIS — K219 Gastro-esophageal reflux disease without esophagitis: Secondary | ICD-10-CM | POA: Diagnosis not present

## 2021-07-18 DIAGNOSIS — Z5331 Laparoscopic surgical procedure converted to open procedure: Secondary | ICD-10-CM | POA: Diagnosis not present

## 2021-07-18 DIAGNOSIS — B20 Human immunodeficiency virus [HIV] disease: Secondary | ICD-10-CM | POA: Diagnosis not present

## 2021-07-18 DIAGNOSIS — R140 Abdominal distension (gaseous): Secondary | ICD-10-CM | POA: Diagnosis not present

## 2021-07-18 DIAGNOSIS — Z905 Acquired absence of kidney: Secondary | ICD-10-CM | POA: Diagnosis not present

## 2021-07-18 DIAGNOSIS — G8918 Other acute postprocedural pain: Secondary | ICD-10-CM | POA: Diagnosis not present

## 2021-07-18 DIAGNOSIS — F4310 Post-traumatic stress disorder, unspecified: Secondary | ICD-10-CM | POA: Diagnosis not present

## 2021-07-18 DIAGNOSIS — K660 Peritoneal adhesions (postprocedural) (postinfection): Secondary | ICD-10-CM | POA: Diagnosis not present

## 2021-07-18 DIAGNOSIS — I441 Atrioventricular block, second degree: Secondary | ICD-10-CM | POA: Diagnosis not present

## 2021-07-18 DIAGNOSIS — I129 Hypertensive chronic kidney disease with stage 1 through stage 4 chronic kidney disease, or unspecified chronic kidney disease: Secondary | ICD-10-CM | POA: Diagnosis not present

## 2021-07-18 DIAGNOSIS — Z4659 Encounter for fitting and adjustment of other gastrointestinal appliance and device: Secondary | ICD-10-CM | POA: Diagnosis not present

## 2021-07-30 NOTE — Progress Notes (Signed)
Remote pacemaker transmission.   

## 2021-08-01 ENCOUNTER — Encounter: Payer: BC Managed Care – PPO | Admitting: Cardiology

## 2021-08-01 DIAGNOSIS — K5900 Constipation, unspecified: Secondary | ICD-10-CM | POA: Diagnosis not present

## 2021-08-01 DIAGNOSIS — R197 Diarrhea, unspecified: Secondary | ICD-10-CM | POA: Diagnosis not present

## 2021-08-01 DIAGNOSIS — C184 Malignant neoplasm of transverse colon: Secondary | ICD-10-CM | POA: Diagnosis not present

## 2021-08-01 DIAGNOSIS — Z9049 Acquired absence of other specified parts of digestive tract: Secondary | ICD-10-CM | POA: Diagnosis not present

## 2021-08-06 ENCOUNTER — Encounter: Payer: Self-pay | Admitting: Cardiology

## 2021-08-06 ENCOUNTER — Ambulatory Visit (INDEPENDENT_AMBULATORY_CARE_PROVIDER_SITE_OTHER): Payer: BC Managed Care – PPO | Admitting: Cardiology

## 2021-08-06 VITALS — BP 118/80 | HR 104 | Ht 69.0 in | Wt 205.6 lb

## 2021-08-06 DIAGNOSIS — I441 Atrioventricular block, second degree: Secondary | ICD-10-CM | POA: Diagnosis not present

## 2021-08-06 NOTE — Progress Notes (Signed)
Electrophysiology Office Note   Date:  08/06/2021   ID:  Joshua Capozzi., DOB 10/07/1950, MRN [id number]  PCP:  [name]  Cardiologist:  [name] Primary Electrophysiologist:  [name]    Chief Complaint: Mobitz 1 AV block   History of Present Illness: Joshua Staver. is a 71 y.o. male who is being seen today for the evaluation of Mobitz 1 AV block at the request of [name]. Presenting today for electrophysiology evaluation.    He has a history significant for hypertension, hyperlipidemia, insulin-dependent diabetes, HIV, hemochromatosis.  He had a prior ECG done that showed Mobitz 1 AV block.  Echo had no evidence of hemochromatosis.  He presented to clinic in 2-1 AV block feeling fatigued.  He is now status post Medtronic dual-chamber pacemaker implanted 04/11/2021.  Since past pacemaker was implanted, he has had a right hemicolectomy.  He has been having issues with diarrhea postoperative and has lost weight.  He is also having some discomfort.  He feels that his rapid heart rate is due to discomfort.  Today, denies symptoms of palpitations, chest pain, shortness of breath, orthopnea, PND, lower extremity edema, claudication, dizziness, presyncope, syncope, bleeding, or neurologic sequela. The patient is tolerating medications without difficulties.     Past Medical History:  Diagnosis Date   Complication of anesthesia    ANESTHESIA AWARENESS  DURING ADRENALECTOMY AND COLONOSCOPY   Diabetes mellitus    on oral meds-no insulin   DIABETES MELLITUS, TYPE II, UNCONTROLLED 06/22/2006   GILBERT'S SYNDROME 02/25/2007   GOUT 06/22/2006   HEMOCHROMATOSIS, HX OF 12/15/2005   phlebotomy every 6 to 8 weeks--at [facility]   HIV disease ([place]) 1990   [name]   HNP (herniated nucleus pulposus)    LUMBAR PAIN WITH PAIN DOWN LEFT LEG TO TOES-SOME NUMBNESS AND TINGLING IN BIG TOE   Hypertension     Past Surgical History:  Procedure Laterality Date   ADRENALECTOMY     RIGHT ADRENALECTOMY    CHOLECYSTECTOMY     LUMBAR LAMINECTOMY/DECOMPRESSION MICRODISCECTOMY  04/23/2011   Procedure: LUMBAR LAMINECTOMY/DECOMPRESSION MICRODISCECTOMY;  Surgeon: [name];  Location: [facility];  Service: Orthopedics;  Laterality: N/A;  Decompression L5 S1 (X-Ray)   ORIF LEFT SHOULDER     STILL HAS HARDWARE IN THE SHOULDER   PACEMAKER IMPLANT N/A 04/11/2021   Procedure: PACEMAKER IMPLANT;  Surgeon: [name];  Location: [facility];  Service: Cardiovascular;  Laterality: N/A;     Current Outpatient Medications  Medication Sig Dispense Refill   acetaminophen (TYLENOL) 500 MG tablet Take 1,000 mg by mouth every 6 (six) hours as needed (for pain.).     allopurinol (ZYLOPRIM) 300 MG tablet Take 300 mg by mouth in the morning.     amLODipine (NORVASC) 5 MG tablet Take 1 tablet (5 mg total) by mouth daily. 90 tablet 2   atorvastatin (LIPITOR) 10 MG tablet Take 10 mg by mouth at bedtime.     BD PEN NEEDLE NANO 2ND GEN 32G X 4 MM MISC Inject into the skin daily.     carvedilol (COREG) 25 MG tablet Take 1 tablet (25 mg total) by mouth 2 (two) times daily with a meal. 60 tablet 5   colchicine 0.6 MG tablet Take 0.6 mg by mouth as needed (gout).      Dapagliflozin-metFORMIN HCl ER (XIGDUO XR) 12-998 MG TB24 Take 1 tablet by mouth daily.  dextromethorphan-guaiFENesin (MUCINEX DM) 30-600 MG 12hr tablet Take 1 tablet by mouth 2 (two) times daily as needed (cough/congestion.).     fexofenadine (ALLEGRA) 180 MG tablet Take 180 mg by mouth in the morning.     furosemide (LASIX) 20 MG tablet Take 20 mg by mouth in the morning.     gabapentin (NEURONTIN) 300 MG capsule TAKE ONE CAPSULE BY MOUTH TWICE DAILY (Patient taking differently: Take 300-600 mg by mouth See admin instructions. Take 1 capsule (300 mg) by mouth in the morning & take 2 capsules (600 mg) by mouth at night.) 30 capsule 1    glucose blood (ONETOUCH VERIO) test strip use to test three times daily as needed     hydrALAZINE (APRESOLINE) 50 MG tablet Take 75 mg by mouth 3 (three) times daily.     icosapent Ethyl (VASCEPA) 1 g capsule Take 2 g by mouth 2 (two) times daily.     ODEFSEY 200-25-25 MG TABS tablet TAKE ONE TABLET DAILY WITH BREAKFAST 30 tablet 5   SOLIQUA 100-33 UNT-MCG/ML SOPN Inject 60 Units into the skin in the morning. Injects 60 units daily  12   telmisartan (MICARDIS) 80 MG tablet Take 80 mg by mouth daily before breakfast.     testosterone cypionate (DEPOTESTOSTERONE CYPIONATE) 200 MG/ML injection Inject 200 mg into the muscle every 14 (fourteen) days.     No current facility-administered medications for this visit.    Allergies:   Hydromorphone, Scopolamine, Beta adrenergic blockers, Escitalopram oxalate, Metformin hcl, Codeine, and Morphine sulfate   Social History:  The patient  reports that he has never smoked. He has never used smokeless tobacco. He reports that he does not drink alcohol and does not use drugs.   Family History:  The patient's family history includes Atrial fibrillation in his father and mother; Hyperlipidemia in his father and mother; Hypertension in his father and mother.   ROS:  Please see the history of present illness.   Otherwise, review of systems is positive for none.   All other systems are reviewed and negative.   PHYSICAL EXAM: VS:  BP 118/80   Pulse (!) 104   Ht '5\' 9"'$  (1.753 m)   Wt 205 lb 9.6 oz (93.3 kg)   SpO2 98%   BMI 30.36 kg/m  , BMI Body mass index is 30.36 kg/m. GEN: Well nourished, well developed, in no acute distress  HEENT: normal  Neck: no JVD, carotid bruits, or masses Cardiac: RRR; no murmurs, rubs, or gallops,no edema  Respiratory:  clear to auscultation bilaterally, normal work of breathing GI: soft, nontender, nondistended, + BS MS: no deformity or atrophy  Skin: warm and dry, device site well healed Neuro:  Strength and sensation are  intact Psych: euthymic mood, full affect  EKG:  EKG is ordered today. Personal review of the ekg ordered shows sinus rhythm, ventricular paced  Personal review of the device interrogation today. Results in [place]: 04/11/2021: B Natriuretic Peptide 196.1; TSH 0.822 06/21/2021: ALT 16; BUN 13; Creat 1.26; Hemoglobin 16.6; Platelets 194; Potassium 4.4; Sodium 144    Lipid Panel     Component Value Date/Time   CHOL 74 06/21/2021 0334   TRIG 303 (H) 06/21/2021 0334   HDL 39 (L) 06/21/2021 0334   CHOLHDL 1.9 06/21/2021 0334   VLDL NOT CALC 04/22/2016 1536   LDLCALC <10 06/21/2021 0334   LDLDIRECT 55.9 11/08/2010 0835     Wt Readings from Last 3 Encounters:  08/06/21 205 lb 9.6  oz (93.3 kg)  07/04/21 229 lb (103.9 kg)  04/12/21 (P) 215 lb 14.4 oz (97.9 kg)      Other studies Reviewed: Additional studies/ records that were reviewed today include: TTE 11/25/19  Review of the above records today demonstrates:  Normal LV systolic function with EF 71%. Left ventricle cavity is normal  in size. Normal left ventricular wall thickness. Normal global wall  motion. Patient appeared to be in Wenckebach heart block by EKG during  eam. Hence diastolic function not performed. LVEDP appears to be elevated  by E/e'. Calculated EF 71%.  Structurally normal mitral valve.  Mild (Grade I) mitral regurgitation.  E-wave dominant mitral inflow.  Structurally normal tricuspid valve.  Mild tricuspid regurgitation. No  evidence of pulmonary hypertension.   ASSESSMENT AND PLAN:  1.  Second-degree AV block: Has Mobitz 1 and Mobitz 2 block.  Felt weak and fatigued.  Is now status post dual-chamber pacemaker.  Device functioning appropriately.  No changes at this time.  2.  Hyperlipidemia: Continue Vascepa and atorvastatin  3.  Hypertension: Currently well controlled  Current medicines are reviewed at length with the patient today.   The patient does not have concerns regarding his  medicines.  The following changes were made today: None  Labs/ tests ordered today include:  Orders Placed This Encounter  Procedures   EKG 12-Lead     Disposition:   FU 9 months  Signed, Reiner Loewen Meredith Leeds, [name]  08/06/2021 4:35 PM     [place] [address] [phone] (office) [phone] (fax)

## 2021-08-08 DIAGNOSIS — C259 Malignant neoplasm of pancreas, unspecified: Secondary | ICD-10-CM | POA: Diagnosis not present

## 2021-08-08 DIAGNOSIS — Z006 Encounter for examination for normal comparison and control in clinical research program: Secondary | ICD-10-CM | POA: Diagnosis not present

## 2021-08-08 DIAGNOSIS — C184 Malignant neoplasm of transverse colon: Secondary | ICD-10-CM | POA: Diagnosis not present

## 2021-08-08 DIAGNOSIS — Z8042 Family history of malignant neoplasm of prostate: Secondary | ICD-10-CM | POA: Diagnosis not present

## 2021-08-08 DIAGNOSIS — Z800 Family history of malignant neoplasm of digestive organs: Secondary | ICD-10-CM | POA: Diagnosis not present

## 2021-08-08 DIAGNOSIS — Z803 Family history of malignant neoplasm of breast: Secondary | ICD-10-CM | POA: Diagnosis not present

## 2021-08-09 DIAGNOSIS — B20 Human immunodeficiency virus [HIV] disease: Secondary | ICD-10-CM | POA: Diagnosis not present

## 2021-08-09 DIAGNOSIS — I129 Hypertensive chronic kidney disease with stage 1 through stage 4 chronic kidney disease, or unspecified chronic kidney disease: Secondary | ICD-10-CM | POA: Diagnosis not present

## 2021-08-09 DIAGNOSIS — N179 Acute kidney failure, unspecified: Secondary | ICD-10-CM | POA: Diagnosis not present

## 2021-08-09 DIAGNOSIS — A0811 Acute gastroenteropathy due to Norwalk agent: Secondary | ICD-10-CM | POA: Diagnosis not present

## 2021-08-09 DIAGNOSIS — N189 Chronic kidney disease, unspecified: Secondary | ICD-10-CM | POA: Diagnosis not present

## 2021-08-09 DIAGNOSIS — Z9049 Acquired absence of other specified parts of digestive tract: Secondary | ICD-10-CM | POA: Diagnosis not present

## 2021-08-09 DIAGNOSIS — I959 Hypotension, unspecified: Secondary | ICD-10-CM | POA: Diagnosis not present

## 2021-08-09 DIAGNOSIS — I10 Essential (primary) hypertension: Secondary | ICD-10-CM | POA: Diagnosis not present

## 2021-08-09 DIAGNOSIS — R197 Diarrhea, unspecified: Secondary | ICD-10-CM | POA: Diagnosis not present

## 2021-08-09 DIAGNOSIS — E119 Type 2 diabetes mellitus without complications: Secondary | ICD-10-CM | POA: Diagnosis not present

## 2021-08-09 DIAGNOSIS — A09 Infectious gastroenteritis and colitis, unspecified: Secondary | ICD-10-CM | POA: Diagnosis not present

## 2021-08-09 DIAGNOSIS — R571 Hypovolemic shock: Secondary | ICD-10-CM | POA: Diagnosis not present

## 2021-08-09 DIAGNOSIS — I951 Orthostatic hypotension: Secondary | ICD-10-CM | POA: Diagnosis not present

## 2021-08-21 DIAGNOSIS — C184 Malignant neoplasm of transverse colon: Secondary | ICD-10-CM | POA: Diagnosis not present

## 2021-08-21 DIAGNOSIS — Z7984 Long term (current) use of oral hypoglycemic drugs: Secondary | ICD-10-CM | POA: Diagnosis not present

## 2021-08-21 DIAGNOSIS — E119 Type 2 diabetes mellitus without complications: Secondary | ICD-10-CM | POA: Diagnosis not present

## 2021-08-23 DIAGNOSIS — H52203 Unspecified astigmatism, bilateral: Secondary | ICD-10-CM | POA: Diagnosis not present

## 2021-08-23 DIAGNOSIS — H524 Presbyopia: Secondary | ICD-10-CM | POA: Diagnosis not present

## 2021-08-23 DIAGNOSIS — Z961 Presence of intraocular lens: Secondary | ICD-10-CM | POA: Diagnosis not present

## 2021-08-23 DIAGNOSIS — E119 Type 2 diabetes mellitus without complications: Secondary | ICD-10-CM | POA: Diagnosis not present

## 2021-08-27 DIAGNOSIS — L814 Other melanin hyperpigmentation: Secondary | ICD-10-CM | POA: Diagnosis not present

## 2021-08-27 DIAGNOSIS — L578 Other skin changes due to chronic exposure to nonionizing radiation: Secondary | ICD-10-CM | POA: Diagnosis not present

## 2021-08-27 DIAGNOSIS — Z85828 Personal history of other malignant neoplasm of skin: Secondary | ICD-10-CM | POA: Diagnosis not present

## 2021-08-27 DIAGNOSIS — L570 Actinic keratosis: Secondary | ICD-10-CM | POA: Diagnosis not present

## 2021-08-27 DIAGNOSIS — X32XXXS Exposure to sunlight, sequela: Secondary | ICD-10-CM | POA: Diagnosis not present

## 2021-08-28 ENCOUNTER — Encounter: Payer: Self-pay | Admitting: Infectious Diseases

## 2021-09-20 DIAGNOSIS — E291 Testicular hypofunction: Secondary | ICD-10-CM | POA: Diagnosis not present

## 2021-10-04 DIAGNOSIS — E291 Testicular hypofunction: Secondary | ICD-10-CM | POA: Diagnosis not present

## 2021-10-04 DIAGNOSIS — E1142 Type 2 diabetes mellitus with diabetic polyneuropathy: Secondary | ICD-10-CM | POA: Diagnosis not present

## 2021-10-04 DIAGNOSIS — Z125 Encounter for screening for malignant neoplasm of prostate: Secondary | ICD-10-CM | POA: Diagnosis not present

## 2021-10-11 DIAGNOSIS — E291 Testicular hypofunction: Secondary | ICD-10-CM | POA: Diagnosis not present

## 2021-10-14 ENCOUNTER — Other Ambulatory Visit: Payer: Self-pay | Admitting: *Deleted

## 2021-10-14 MED ORDER — CARVEDILOL 25 MG PO TABS: 25.0000 mg | ORAL_TABLET | Freq: Two times a day (BID) | ORAL | 8 refills | Status: DC

## 2021-10-15 ENCOUNTER — Ambulatory Visit (INDEPENDENT_AMBULATORY_CARE_PROVIDER_SITE_OTHER): Payer: BC Managed Care – PPO

## 2021-10-15 DIAGNOSIS — I441 Atrioventricular block, second degree: Secondary | ICD-10-CM | POA: Diagnosis not present

## 2021-10-15 LAB — CUP PACEART REMOTE DEVICE CHECK
Battery Remaining Longevity: 144 mo
Battery Voltage: 3.12 V
Brady Statistic AP VP Percent: 1.77 %
Brady Statistic AP VS Percent: 0 %
Brady Statistic AS VP Percent: 97.64 %
Brady Statistic AS VS Percent: 0.59 %
Brady Statistic RA Percent Paced: 1.93 %
Brady Statistic RV Percent Paced: 99.41 %
Date Time Interrogation Session: 20230807224932
Implantable Lead Implant Date: 20230202
Implantable Lead Implant Date: 20230202
Implantable Lead Location: 753859
Implantable Lead Location: 753860
Implantable Lead Model: 3830
Implantable Lead Model: 5076
Implantable Pulse Generator Implant Date: 20230202
Lead Channel Impedance Value: 342 Ohm
Lead Channel Impedance Value: 361 Ohm
Lead Channel Impedance Value: 437 Ohm
Lead Channel Impedance Value: 475 Ohm
Lead Channel Pacing Threshold Amplitude: 0.5 V
Lead Channel Pacing Threshold Amplitude: 0.625 V
Lead Channel Pacing Threshold Pulse Width: 0.4 ms
Lead Channel Pacing Threshold Pulse Width: 0.4 ms
Lead Channel Sensing Intrinsic Amplitude: 11.75 mV
Lead Channel Sensing Intrinsic Amplitude: 3.75 mV
Lead Channel Sensing Intrinsic Amplitude: 3.75 mV
Lead Channel Sensing Intrinsic Amplitude: 7.375 mV
Lead Channel Setting Pacing Amplitude: 1.5 V
Lead Channel Setting Pacing Amplitude: 2 V
Lead Channel Setting Pacing Pulse Width: 0.4 ms
Lead Channel Setting Sensing Sensitivity: 1.2 mV

## 2021-10-21 DIAGNOSIS — R062 Wheezing: Secondary | ICD-10-CM | POA: Diagnosis not present

## 2021-10-21 DIAGNOSIS — J209 Acute bronchitis, unspecified: Secondary | ICD-10-CM | POA: Diagnosis not present

## 2021-10-21 DIAGNOSIS — R051 Acute cough: Secondary | ICD-10-CM | POA: Diagnosis not present

## 2021-10-21 DIAGNOSIS — R059 Cough, unspecified: Secondary | ICD-10-CM | POA: Diagnosis not present

## 2021-11-07 DIAGNOSIS — Z888 Allergy status to other drugs, medicaments and biological substances status: Secondary | ICD-10-CM | POA: Diagnosis not present

## 2021-11-07 DIAGNOSIS — Z1509 Genetic susceptibility to other malignant neoplasm: Secondary | ICD-10-CM | POA: Diagnosis not present

## 2021-11-07 DIAGNOSIS — C184 Malignant neoplasm of transverse colon: Secondary | ICD-10-CM | POA: Diagnosis not present

## 2021-11-07 DIAGNOSIS — Z885 Allergy status to narcotic agent status: Secondary | ICD-10-CM | POA: Diagnosis not present

## 2021-11-07 DIAGNOSIS — C189 Malignant neoplasm of colon, unspecified: Secondary | ICD-10-CM | POA: Diagnosis not present

## 2021-11-08 DIAGNOSIS — Z85038 Personal history of other malignant neoplasm of large intestine: Secondary | ICD-10-CM | POA: Diagnosis not present

## 2021-11-08 DIAGNOSIS — Z1509 Genetic susceptibility to other malignant neoplasm: Secondary | ICD-10-CM | POA: Diagnosis not present

## 2021-11-12 ENCOUNTER — Telehealth: Payer: Self-pay

## 2021-11-12 NOTE — Telephone Encounter (Signed)
Dr. Curt Bears, in reviewing patient's chart for preop clearance for upcoing EGD/Colonoscopy, [name] had mentioned new onset a fib seen on device on 07/04/21 but it was not mentioned on subsequent visits. Has there been further episodes of a fib and if so do we need to address [facility] prior to clearing him for procedure.  Please route your response to p cv div preop.  Thank you, Emmaline Life, NP-C    11/12/2021, 4:56 PM [address] Office [phone] Fax [phone]

## 2021-11-12 NOTE — Telephone Encounter (Signed)
   Pre-operative Risk Assessment    Patient Name: [name].  DOB: 05/04/1950 MRN: [id number]      Request for Surgical Clearance    Procedure:   ENDOSCOPY/COLONOSCOPY  Date of Surgery:  Clearance 02/05/22                                 Surgeon:  DR. Arta Silence Surgeon's Group or Practice Name:  [facility]  Phone number:  [phone] Fax number:  [phone]   Type of Clearance Requested:   - Medical    Type of Anesthesia:   PROPOFOL   Additional requests/questions:    Signed, Jacinta Shoe   11/12/2021, 11:22 AM

## 2021-11-13 NOTE — Telephone Encounter (Signed)
Primary Cardiologist:None   Preoperative team, please contact this patient and set up a phone call appointment for further preoperative risk assessment. Please obtain consent and complete medication review. Thank you for your help.   I confirm that guidance regarding antiplatelet and oral anticoagulation therapy has been completed and, if necessary, noted below (none requested). And no indication for [facility] at this time per Dr. Curt Bears.     Emmaline Life, NP-C     11/13/2021, 2:10 PM [address] Office [phone] Fax [phone]

## 2021-11-14 ENCOUNTER — Telehealth: Payer: Self-pay | Admitting: *Deleted

## 2021-11-14 NOTE — Telephone Encounter (Signed)
Pt agreeable to tele pre op appt 01/10/22 at 10 am. Med rec and consent are done.

## 2021-11-14 NOTE — Telephone Encounter (Signed)
Pt agreeable to tele pre op appt 01/10/22 at 10 am. Med rec and consent are done.      Patient Consent for Virtual Visit        [name]. has provided verbal consent on 11/14/2021 for a virtual visit (video or telephone).   CONSENT FOR VIRTUAL VISIT FOR:  [name].  By participating in this virtual visit I agree to the following:  I hereby voluntarily request, consent and authorize [facility] and its employed or contracted physicians, physician assistants, nurse practitioners or other licensed health care professionals (the Practitioner), to provide me with telemedicine health care services (the "Services") as deemed necessary by the treating Practitioner. I acknowledge and consent to receive the Services by the Practitioner via telemedicine. I understand that the telemedicine visit will involve communicating with the Practitioner through live audiovisual communication technology and the disclosure of certain medical information by electronic transmission. I acknowledge that I have been given the opportunity to request an in-person assessment or other available alternative prior to the telemedicine visit and am voluntarily participating in the telemedicine visit.  I understand that I have the right to withhold or withdraw my consent to the use of telemedicine in the course of my care at any time, without affecting my right to future care or treatment, and that the Practitioner or I may terminate the telemedicine visit at any time. I understand that I have the right to inspect all information obtained and/or recorded in the course of the telemedicine visit and may receive copies of available information for a reasonable fee.  I understand that some of the potential risks of receiving the Services via telemedicine include:  Delay or interruption in medical evaluation due to technological equipment failure or disruption; Information transmitted may not be sufficient (e.g.  poor resolution of images) to allow for appropriate medical decision making by the Practitioner; and/or  In rare instances, security protocols could fail, causing a breach of personal health information.  Furthermore, I acknowledge that it is my responsibility to provide information about my medical history, conditions and care that is complete and accurate to the best of my ability. I acknowledge that Practitioner's advice, recommendations, and/or decision may be based on factors not within their control, such as incomplete or inaccurate data provided by me or distortions of diagnostic images or specimens that may result from electronic transmissions. I understand that the practice of medicine is not an exact science and that Practitioner makes no warranties or guarantees regarding treatment outcomes. I acknowledge that a copy of this consent can be made available to me via my patient portal ([facility]), or I can request a printed copy by calling the office of [facility].    I understand that my insurance will be billed for this visit.   I have read or had this consent read to me. I understand the contents of this consent, which adequately explains the benefits and risks of the Services being provided via telemedicine.  I have been provided ample opportunity to ask questions regarding this consent and the Services and have had my questions answered to my satisfaction. I give my informed consent for the services to be provided through the use of telemedicine in my medical care

## 2021-11-20 NOTE — Progress Notes (Signed)
Remote pacemaker transmission.   

## 2021-11-22 DIAGNOSIS — E291 Testicular hypofunction: Secondary | ICD-10-CM | POA: Diagnosis not present

## 2021-11-26 ENCOUNTER — Other Ambulatory Visit: Payer: Self-pay | Admitting: Infectious Diseases

## 2021-11-26 DIAGNOSIS — B20 Human immunodeficiency virus [HIV] disease: Secondary | ICD-10-CM

## 2021-11-28 DIAGNOSIS — N179 Acute kidney failure, unspecified: Secondary | ICD-10-CM | POA: Diagnosis not present

## 2021-11-28 DIAGNOSIS — E1122 Type 2 diabetes mellitus with diabetic chronic kidney disease: Secondary | ICD-10-CM | POA: Diagnosis not present

## 2021-11-28 DIAGNOSIS — N182 Chronic kidney disease, stage 2 (mild): Secondary | ICD-10-CM | POA: Diagnosis not present

## 2021-11-28 DIAGNOSIS — I129 Hypertensive chronic kidney disease with stage 1 through stage 4 chronic kidney disease, or unspecified chronic kidney disease: Secondary | ICD-10-CM | POA: Diagnosis not present

## 2021-12-03 NOTE — Addendum Note (Signed)
Addended by: Truddie Crumble on: 12/03/2021 05:34 PM   Modules accepted: Orders

## 2021-12-04 ENCOUNTER — Other Ambulatory Visit: Payer: BC Managed Care – PPO

## 2021-12-13 ENCOUNTER — Other Ambulatory Visit (HOSPITAL_COMMUNITY)
Admission: RE | Admit: 2021-12-13 | Discharge: 2021-12-13 | Disposition: A | Discharge status: Home / Self Care | Payer: BC Managed Care – PPO | Source: Ambulatory Visit | Attending: Infectious Diseases | Admitting: Infectious Diseases

## 2021-12-13 ENCOUNTER — Other Ambulatory Visit (INDEPENDENT_AMBULATORY_CARE_PROVIDER_SITE_OTHER): Payer: BC Managed Care – PPO

## 2021-12-13 DIAGNOSIS — B20 Human immunodeficiency virus [HIV] disease: Secondary | ICD-10-CM

## 2021-12-13 DIAGNOSIS — Z113 Encounter for screening for infections with a predominantly sexual mode of transmission: Secondary | ICD-10-CM | POA: Diagnosis not present

## 2021-12-13 DIAGNOSIS — E291 Testicular hypofunction: Secondary | ICD-10-CM | POA: Diagnosis not present

## 2021-12-13 DIAGNOSIS — Z79899 Other long term (current) drug therapy: Secondary | ICD-10-CM

## 2021-12-13 LAB — T-HELPER CELL (CD4) - (RCID CLINIC ONLY)
CD4 % Helper T Cell: 14 % — ABNORMAL LOW (ref 33–65)
CD4 T Cell Abs: 425 /uL (ref 400–1790)

## 2021-12-14 LAB — LIPID PANEL
Chol/HDL Ratio: 2.3 ratio (ref 0.0–5.0)
Cholesterol, Total: 83 mg/dL — ABNORMAL LOW (ref 100–199)
HDL: 36 mg/dL — ABNORMAL LOW (ref >39)
LDL Chol Calc (NIH): 7 mg/dL (ref 0–99)
Triglycerides: 280 mg/dL — ABNORMAL HIGH (ref 0–149)
VLDL Cholesterol Cal: 40 mg/dL (ref 5–40)

## 2021-12-14 LAB — CBC
Hematocrit: 43.6 % (ref 37.5–51.0)
Hemoglobin: 14.8 g/dL (ref 13.0–17.7)
MCH: 30.2 pg (ref 26.6–33.0)
MCHC: 33.9 g/dL (ref 31.5–35.7)
MCV: 89 fL (ref 79–97)
Platelets: 191 10*3/uL (ref 150–450)
RBC: 4.9 x10E6/uL (ref 4.14–5.80)
RDW: 14.1 % (ref 11.6–15.4)
WBC: 7.2 10*3/uL (ref 3.4–10.8)

## 2021-12-14 LAB — COMPREHENSIVE METABOLIC PANEL
ALT: 22 IU/L (ref 0–44)
AST: 26 IU/L (ref 0–40)
Albumin/Globulin Ratio: 1.6 (ref 1.2–2.2)
Albumin: 4.7 g/dL (ref 3.9–4.9)
Alkaline Phosphatase: 107 IU/L (ref 44–121)
BUN/Creatinine Ratio: 13 (ref 10–24)
BUN: 17 mg/dL (ref 8–27)
Bilirubin Total: 1.8 mg/dL — ABNORMAL HIGH (ref 0.0–1.2)
CO2: 23 mmol/L (ref 20–29)
Calcium: 9.2 mg/dL (ref 8.6–10.2)
Chloride: 104 mmol/L (ref 96–106)
Creatinine, Ser: 1.28 mg/dL — ABNORMAL HIGH (ref 0.76–1.27)
Globulin, Total: 2.9 g/dL (ref 1.5–4.5)
Glucose: 160 mg/dL — ABNORMAL HIGH (ref 70–99)
Potassium: 4.8 mmol/L (ref 3.5–5.2)
Sodium: 142 mmol/L (ref 134–144)
Total Protein: 7.6 g/dL (ref 6.0–8.5)
eGFR: 61 mL/min/{1.73_m2} (ref >59)

## 2021-12-14 LAB — HIV-1 RNA QUANT-NO REFLEX-BLD: HIV-1 RNA Viral Load: <20 copies/mL

## 2021-12-14 LAB — RPR: RPR Ser Ql: NONREACTIVE

## 2021-12-16 LAB — URINE CYTOLOGY ANCILLARY ONLY
Chlamydia: NEGATIVE
Comment: NEGATIVE
Comment: NORMAL
Neisseria Gonorrhea: NEGATIVE

## 2021-12-18 ENCOUNTER — Encounter: Payer: BC Managed Care – PPO | Admitting: Infectious Diseases

## 2021-12-24 ENCOUNTER — Encounter: Payer: Self-pay | Admitting: Infectious Diseases

## 2021-12-24 ENCOUNTER — Other Ambulatory Visit: Payer: Self-pay

## 2021-12-24 ENCOUNTER — Ambulatory Visit (INDEPENDENT_AMBULATORY_CARE_PROVIDER_SITE_OTHER): Payer: BC Managed Care – PPO | Admitting: Infectious Diseases

## 2021-12-24 VITALS — BP 127/63 | HR 83 | Temp 97.7°F | Resp 24 | Ht 69.0 in | Wt 228.2 lb

## 2021-12-24 DIAGNOSIS — E118 Type 2 diabetes mellitus with unspecified complications: Secondary | ICD-10-CM

## 2021-12-24 DIAGNOSIS — N182 Chronic kidney disease, stage 2 (mild): Secondary | ICD-10-CM | POA: Diagnosis not present

## 2021-12-24 DIAGNOSIS — I10 Essential (primary) hypertension: Secondary | ICD-10-CM

## 2021-12-24 DIAGNOSIS — B20 Human immunodeficiency virus [HIV] disease: Secondary | ICD-10-CM | POA: Diagnosis not present

## 2021-12-24 DIAGNOSIS — Z1509 Genetic susceptibility to other malignant neoplasm: Secondary | ICD-10-CM

## 2021-12-24 DIAGNOSIS — Z79899 Other long term (current) drug therapy: Secondary | ICD-10-CM

## 2021-12-24 DIAGNOSIS — Z23 Encounter for immunization: Secondary | ICD-10-CM

## 2021-12-24 DIAGNOSIS — I129 Hypertensive chronic kidney disease with stage 1 through stage 4 chronic kidney disease, or unspecified chronic kidney disease: Secondary | ICD-10-CM

## 2021-12-24 DIAGNOSIS — Z113 Encounter for screening for infections with a predominantly sexual mode of transmission: Secondary | ICD-10-CM

## 2021-12-24 DIAGNOSIS — E1122 Type 2 diabetes mellitus with diabetic chronic kidney disease: Secondary | ICD-10-CM

## 2021-12-24 NOTE — Progress Notes (Signed)
Subjective:    Patient ID: Joshua Tendler., male  DOB: 09/26/1950, 71 y.o.        MRN: [id number]   HPI 71 yo M with hx of hemochromatosis, DM2 (dx 2007), secondary hypogonadism (on clomid), CKD3, hyperlipidemia, lumbar laminectomy/decompression (04-2011), and HIV+ since 1990 when he was dx on insurance physical.  He had type 2 AVB and got pacer this year.  He has been maintained on atripla --> changed to [facility] 2017.  He was found to have stage II colon cancer 07-2021, R colectomy. Was dx with Lynch syndrome (repeat colon 01-2022). Worried he may not be able to afford his rx after he retires in January. He wants to travel more, do volunteer work.   Has had ophtho eval this year.   Offered flu shot, would like COVID and RSV today as well.   Has had CEA (1.3 to 1.7)  HIV 1 RNA Quant  Date Value  06/21/2021 NOT DETECTED copies/mL  01/30/2021 Not Detected Copies/mL  05/01/2020 <20 Copies/mL   HIV-1 RNA Viral Load (copies/mL)  Date Value  12/13/2021 <20   CD4 T Cell Abs (/uL)  Date Value  12/13/2021 425  05/01/2020 549  05/19/2019 858     Health Maintenance  Topic Date Due  . Diabetic kidney evaluation - Urine ACR  01/04/2008  . OPHTHALMOLOGY EXAM  02/07/2018  . COVID-19 Vaccine (4 - Moderna risk series) 12/31/2019  . FOOT EXAM  07/24/2021  . INFLUENZA VACCINE  10/08/2021  . HEMOGLOBIN A1C  12/21/2021  . Diabetic kidney evaluation - GFR measurement  12/14/2022  . COLONOSCOPY (Pts 45-107yr Insurance coverage will need to be confirmed)  05/28/2026  . TETANUS/TDAP  04/16/2027  . Pneumonia Vaccine 69 Years old  Completed  . Hepatitis C Screening  Completed  . Zoster Vaccines- Shingrix  Completed  . HPV VACCINES  Aged Out      Review of Systems  Constitutional:  Negative for weight loss.  Eyes:  Negative for blurred vision.  Respiratory:  Negative for cough and shortness of breath.   Cardiovascular:  Negative for chest pain.  Gastrointestinal:  Positive for  diarrhea. Negative for constipation.  Genitourinary:  Negative for dysuria.  Neurological:  Positive for sensory change (unchanged from prev. on neurontin.).    Please see HPI. All other systems reviewed and negative.     Objective:  Physical Exam Vitals reviewed.  Constitutional:      Appearance: Normal appearance.  HENT:     Mouth/Throat:     Mouth: Mucous membranes are dry.  Eyes:     Extraocular Movements: Extraocular movements intact.     Pupils: Pupils are equal, round, and reactive to light.  Cardiovascular:     Rate and Rhythm: Normal rate and regular rhythm.  Pulmonary:     Effort: Pulmonary effort is normal.     Breath sounds: Normal breath sounds.  Abdominal:     General: Bowel sounds are normal.     Palpations: Abdomen is soft.  Musculoskeletal:        General: Normal range of motion.     Cervical back: Normal range of motion and neck supple.     Right lower leg: Edema present.     Left lower leg: Edema present.  Neurological:     General: No focal deficit present.     Mental Status: He is alert.  Psychiatric:        Mood and Affect: Mood normal.  Assessment & Plan:

## 2021-12-24 NOTE — Assessment & Plan Note (Signed)
He is doing well Appreciate hs endocrinologist's f/u believes his last A1C was 5.8% Has had ophtho this year.

## 2021-12-24 NOTE — Assessment & Plan Note (Addendum)
He is doing well post R colectomy 07-2021. He is scheduled to have repeat colon 01-2022.  He is to have PSA from his PCP.  Will check at his next visit as well His CEA velocity has been flat.  He has been informing his cousins to get tested.

## 2021-12-24 NOTE — Assessment & Plan Note (Signed)
He is doing well, appreciate PCP f/u.

## 2021-12-24 NOTE — Assessment & Plan Note (Signed)
Cr has increased slightly.  Appreciate Dr Colodonato's f/u.

## 2021-12-24 NOTE — Assessment & Plan Note (Signed)
He is doing well Will get flu vaccine today Will get him set for RSV and COVID in 1 month. No change in odefsy, will assist him if he has issues with coverage with retirement.  rtc in 9 months.

## 2021-12-27 DIAGNOSIS — E291 Testicular hypofunction: Secondary | ICD-10-CM | POA: Diagnosis not present

## 2022-01-10 ENCOUNTER — Ambulatory Visit: Payer: MEDICAID | Attending: Cardiovascular Disease | Admitting: Nurse Practitioner

## 2022-01-10 DIAGNOSIS — Z01810 Encounter for preprocedural cardiovascular examination: Secondary | ICD-10-CM

## 2022-01-10 NOTE — Progress Notes (Signed)
Virtual Visit via Telephone Note   Because of Joshua Oestreich Jr.'s co-morbid illnesses, he is at least at moderate risk for complications without adequate follow up.  This format is felt to be most appropriate for this patient at this time.  The patient did not have access to video technology/had technical difficulties with video requiring transitioning to audio format only (telephone).  All issues noted in this document were discussed and addressed.  No physical exam could be performed with this format.  Please refer to the patient's chart for his consent to telehealth for [facility].  Evaluation Performed:  Preoperative cardiovascular risk assessment _____________   Date:  01/10/2022   Patient ID:  Joshua Oman., DOB Dec 31, 1950, MRN [id number] Patient Location:  Home Provider location:   Office  Primary Care Provider:  Janie Morning, DO Primary Cardiologist:  None  Chief Complaint / Patient Profile   71 y.o. y/o male with a h/o second degree AV block s/p PPM, paroxysmal atrial fibrillation (not on anticoagulation, burden less than 0.1%/day), hypertension, hyperlipidemia, type 2 diabetes, hemochromatosis, HIV, and colon cancer who is pending endoscopy/colonoscopy on 01/21/2022 with Dr. Arta Silence of [facility] and presents today for telephonic preoperative cardiovascular risk assessment.  Past Medical History    Past Medical History:  Diagnosis Date   Complication of anesthesia    ANESTHESIA AWARENESS  DURING ADRENALECTOMY AND COLONOSCOPY   Diabetes mellitus    on oral meds-no insulin   DIABETES MELLITUS, TYPE II, UNCONTROLLED 06/22/2006   GILBERT'S SYNDROME 02/25/2007   GOUT 06/22/2006   HEMOCHROMATOSIS, HX OF 12/15/2005   phlebotomy every 6 to 8 weeks--at [facility]   HIV disease ([place]) 1990   [facility]   HNP (herniated nucleus pulposus)    LUMBAR PAIN WITH PAIN DOWN LEFT LEG TO TOES-SOME NUMBNESS AND  TINGLING IN BIG TOE   Hypertension    Past Surgical History:  Procedure Laterality Date   ADRENALECTOMY     RIGHT ADRENALECTOMY    CHOLECYSTECTOMY     LUMBAR LAMINECTOMY/DECOMPRESSION MICRODISCECTOMY  04/23/2011   Procedure: LUMBAR LAMINECTOMY/DECOMPRESSION MICRODISCECTOMY;  Surgeon: Johnn Hai, MD;  Location: [facility];  Service: Orthopedics;  Laterality: N/A;  Decompression L5 S1 (X-Ray)   ORIF LEFT SHOULDER     STILL HAS HARDWARE IN THE SHOULDER   PACEMAKER IMPLANT N/A 04/11/2021   Procedure: PACEMAKER IMPLANT;  Surgeon: Constance Haw, MD;  Location: [facility];  Service: Cardiovascular;  Laterality: N/A;    Allergies  Allergies  Allergen Reactions   Hydromorphone Itching and Rash   Scopolamine     GIVEN WITH A SURGERY YRS AGO--CAUSED HALLUNCINATIONS Other reaction(s): Confusion/Altered Mental Status Given during Surgery many years ago - Caused Hallucinations GIVEN WITH A SURGERY YRS AGO--CAUSED HALLUNCINATIONS GIVEN WITH A SURGERY YRS AGO--CAUSED HALLUNCINATIONS   Beta Adrenergic Blockers Other (See Comments)    [place]--slowed heart rhythm  Other reaction(s): patient has [place]   Escitalopram Oxalate Rash and Other (See Comments)    lightheaded,  visual  disturbances   Metformin Hcl Diarrhea and Nausea Only    Patient is tolerating    Codeine Itching   Morphine Sulfate Rash    History of Present Illness    Joshua Khachatryan. is a 71 y.o. male who presents via audio/video conferencing for a telehealth visit today.  Pt was last seen in cardiology clinic on 08/06/2021 by Dr. Curt Bears.  At that time Joshua Routt. was doing  well. The patient is now pending procedure as outlined above. Since his last visit, he has done well from a cardiac standpoint. He denies chest pain, palpitations, dyspnea, pnd, orthopnea, n, v, dizziness, syncope, edema, weight gain, or early satiety. All other systems reviewed and are otherwise negative except as noted above.    Home Medications    Prior to Admission medications   Medication Sig Start Date End Date Taking? Authorizing Provider  acetaminophen (TYLENOL) 500 MG tablet Take 1,000 mg by mouth every 6 (six) hours as needed (for pain.).    [provider]  allopurinol (ZYLOPRIM) 300 MG tablet Take 300 mg by mouth in the morning.    [provider]  amLODipine (NORVASC) 5 MG tablet Take 1 tablet (5 mg total) by mouth daily. 04/09/21   Camnitz, Ocie Doyne, MD  atorvastatin (LIPITOR) 10 MG tablet Take 10 mg by mouth at bedtime. 10/01/19   [provider]  BD PEN NEEDLE NANO 2ND GEN 32G X 4 MM MISC Inject into the skin daily. 07/09/20   [provider]  carvedilol (COREG) 25 MG tablet Take 1 tablet (25 mg total) by mouth 2 (two) times daily with a meal. 10/14/21   Camnitz, Ocie Doyne, MD  colchicine 0.6 MG tablet Take 0.6 mg by mouth as needed (gout).     [provider]  Dapagliflozin-metFORMIN HCl ER (XIGDUO XR) 12-998 MG TB24 Take 1 tablet by mouth daily.    [provider]  dextromethorphan-guaiFENesin (MUCINEX DM) 30-600 MG 12hr tablet Take 1 tablet by mouth 2 (two) times daily as needed (cough/congestion.). 04/12/20   [provider]  fexofenadine (ALLEGRA) 180 MG tablet Take 180 mg by mouth in the morning.    [provider]  furosemide (LASIX) 20 MG tablet Take 20 mg by mouth in the morning. 04/29/19   [provider]  gabapentin (NEURONTIN) 300 MG capsule TAKE ONE CAPSULE BY MOUTH TWICE DAILY Patient taking differently: Take 300-600 mg by mouth See admin instructions. Take 1 capsule (300 mg) by mouth in the morning & take 2 capsules (600 mg) by mouth at night. 10/16/16   Trula Slade, DPM  glucose blood (ONETOUCH VERIO) test strip use to test three times daily as needed    [provider]  hydrALAZINE (APRESOLINE) 50 MG tablet Take 75 mg by mouth 3 (three) times daily. 12/07/19   [provider]  icosapent  Ethyl (VASCEPA) 1 g capsule Take 2 g by mouth 2 (two) times daily.    [provider]  ODEFSEY 200-25-25 MG TABS tablet TAKE ONE TABLET DAILY WITH BREAKFAST 11/26/21   Campbell Riches, MD  SOLIQUA 100-33 UNT-MCG/ML SOPN Inject 60 Units into the skin in the morning. Injects 60 units daily 03/30/17   [provider]  telmisartan (MICARDIS) 80 MG tablet Take 80 mg by mouth daily before breakfast.    [provider]  testosterone cypionate (DEPOTESTOSTERONE CYPIONATE) 200 MG/ML injection Inject 200 mg into the muscle every 14 (fourteen) days. 07/08/19   [provider]    Physical Exam    Vital Signs:  Joshua Oman. does not have vital signs available for review today.  Given telephonic nature of communication, physical exam is limited. AAOx3. NAD. Normal affect.  Speech and respirations are unlabored.  Accessory Clinical Findings    None  Assessment & Plan    1.  Preoperative Cardiovascular Risk Assessment:  According to the Revised Cardiac Risk Index (RCRI), his Perioperative Risk of Major  Cardiac Event is (%): 0.4. His Functional Capacity in METs is: 7.25 according to the [facility] Activity Status Index (DASI). Therefore, based on ACC/AHA guidelines, patient would be at acceptable risk for the planned procedure without further cardiovascular testing.  The patient was advised that if he develops new symptoms prior to surgery to contact our office to arrange for a follow-up visit, and he verbalized understanding.    A copy of this note will be routed to requesting surgeon.  Time:   Today, I have spent 5 minutes with the patient with telehealth technology discussing medical history, symptoms, and management plan.     Lenna Sciara, NP  01/10/2022, 10:10 AM

## 2022-01-14 ENCOUNTER — Other Ambulatory Visit (HOSPITAL_COMMUNITY): Payer: Self-pay

## 2022-01-14 ENCOUNTER — Ambulatory Visit (INDEPENDENT_AMBULATORY_CARE_PROVIDER_SITE_OTHER): Payer: BC Managed Care – PPO

## 2022-01-14 DIAGNOSIS — I441 Atrioventricular block, second degree: Secondary | ICD-10-CM | POA: Diagnosis not present

## 2022-01-14 LAB — CUP PACEART REMOTE DEVICE CHECK
Battery Remaining Longevity: 130 mo
Battery Voltage: 3.06 V
Brady Statistic AP VP Percent: 1.34 %
Brady Statistic AP VS Percent: 0 %
Brady Statistic AS VP Percent: 97.85 %
Brady Statistic AS VS Percent: 0.8 %
Brady Statistic RA Percent Paced: 1.61 %
Brady Statistic RV Percent Paced: 99.2 %
Date Time Interrogation Session: 20231107023540
Implantable Lead Connection Status: 753985
Implantable Lead Connection Status: 753985
Implantable Lead Implant Date: 20230202
Implantable Lead Implant Date: 20230202
Implantable Lead Location: 753859
Implantable Lead Location: 753860
Implantable Lead Model: 3830
Implantable Lead Model: 5076
Implantable Pulse Generator Implant Date: 20230202
Lead Channel Impedance Value: 361 Ohm
Lead Channel Impedance Value: 361 Ohm
Lead Channel Impedance Value: 437 Ohm
Lead Channel Impedance Value: 475 Ohm
Lead Channel Pacing Threshold Amplitude: 0.5 V
Lead Channel Pacing Threshold Amplitude: 1.125 V
Lead Channel Pacing Threshold Pulse Width: 0.4 ms
Lead Channel Pacing Threshold Pulse Width: 0.4 ms
Lead Channel Sensing Intrinsic Amplitude: 11.75 mV
Lead Channel Sensing Intrinsic Amplitude: 4 mV
Lead Channel Sensing Intrinsic Amplitude: 4 mV
Lead Channel Sensing Intrinsic Amplitude: 7.375 mV
Lead Channel Setting Pacing Amplitude: 1.5 V
Lead Channel Setting Pacing Amplitude: 2.25 V
Lead Channel Setting Pacing Pulse Width: 0.4 ms
Lead Channel Setting Sensing Sensitivity: 1.2 mV
Zone Setting Status: 755011

## 2022-01-17 DIAGNOSIS — E291 Testicular hypofunction: Secondary | ICD-10-CM | POA: Diagnosis not present

## 2022-01-20 ENCOUNTER — Encounter: Payer: Self-pay | Admitting: Infectious Diseases

## 2022-01-21 DIAGNOSIS — D123 Benign neoplasm of transverse colon: Secondary | ICD-10-CM | POA: Diagnosis not present

## 2022-01-21 DIAGNOSIS — K649 Unspecified hemorrhoids: Secondary | ICD-10-CM | POA: Diagnosis not present

## 2022-01-21 DIAGNOSIS — Z85038 Personal history of other malignant neoplasm of large intestine: Secondary | ICD-10-CM | POA: Diagnosis not present

## 2022-01-21 DIAGNOSIS — Z08 Encounter for follow-up examination after completed treatment for malignant neoplasm: Secondary | ICD-10-CM | POA: Diagnosis not present

## 2022-01-21 DIAGNOSIS — K2930 Chronic superficial gastritis without bleeding: Secondary | ICD-10-CM | POA: Diagnosis not present

## 2022-01-21 DIAGNOSIS — Z980 Intestinal bypass and anastomosis status: Secondary | ICD-10-CM | POA: Diagnosis not present

## 2022-01-22 ENCOUNTER — Telehealth: Payer: Self-pay

## 2022-01-22 NOTE — Telephone Encounter (Signed)
Mailed patients application for [employer] patient assistance to company.  Ph [phone]

## 2022-01-27 DIAGNOSIS — M25512 Pain in left shoulder: Secondary | ICD-10-CM | POA: Diagnosis not present

## 2022-01-28 DIAGNOSIS — M5136 Other intervertebral disc degeneration, lumbar region: Secondary | ICD-10-CM | POA: Diagnosis not present

## 2022-01-29 ENCOUNTER — Telehealth: Payer: Self-pay | Admitting: Genetic Counselor

## 2022-01-29 DIAGNOSIS — M180 Bilateral primary osteoarthritis of first carpometacarpal joints: Secondary | ICD-10-CM | POA: Diagnosis not present

## 2022-02-06 DIAGNOSIS — M5416 Radiculopathy, lumbar region: Secondary | ICD-10-CM | POA: Diagnosis not present

## 2022-02-07 NOTE — Progress Notes (Signed)
Remote pacemaker transmission.   

## 2022-02-10 NOTE — Telephone Encounter (Signed)
Rec'd withdrawal fax from [employer]:   Pt has been withdrawn from program effective 01/23/22. Pt must meet certain criteria to qualify for assistance through PAP.   Call [phone] for questions.

## 2022-02-11 DIAGNOSIS — Z9049 Acquired absence of other specified parts of digestive tract: Secondary | ICD-10-CM | POA: Diagnosis not present

## 2022-02-11 DIAGNOSIS — C182 Malignant neoplasm of ascending colon: Secondary | ICD-10-CM | POA: Diagnosis not present

## 2022-02-13 DIAGNOSIS — C182 Malignant neoplasm of ascending colon: Secondary | ICD-10-CM | POA: Diagnosis not present

## 2022-02-13 DIAGNOSIS — Z885 Allergy status to narcotic agent status: Secondary | ICD-10-CM | POA: Diagnosis not present

## 2022-02-13 DIAGNOSIS — Z888 Allergy status to other drugs, medicaments and biological substances status: Secondary | ICD-10-CM | POA: Diagnosis not present

## 2022-02-13 DIAGNOSIS — Z1509 Genetic susceptibility to other malignant neoplasm: Secondary | ICD-10-CM | POA: Diagnosis not present

## 2022-02-13 DIAGNOSIS — C184 Malignant neoplasm of transverse colon: Secondary | ICD-10-CM | POA: Diagnosis not present

## 2022-02-14 DIAGNOSIS — E291 Testicular hypofunction: Secondary | ICD-10-CM | POA: Diagnosis not present

## 2022-02-24 DIAGNOSIS — C182 Malignant neoplasm of ascending colon: Secondary | ICD-10-CM | POA: Diagnosis not present

## 2022-03-11 DIAGNOSIS — I781 Nevus, non-neoplastic: Secondary | ICD-10-CM | POA: Diagnosis not present

## 2022-03-11 DIAGNOSIS — Z85828 Personal history of other malignant neoplasm of skin: Secondary | ICD-10-CM | POA: Diagnosis not present

## 2022-03-11 DIAGNOSIS — L738 Other specified follicular disorders: Secondary | ICD-10-CM | POA: Diagnosis not present

## 2022-03-11 DIAGNOSIS — L821 Other seborrheic keratosis: Secondary | ICD-10-CM | POA: Diagnosis not present

## 2022-03-25 DIAGNOSIS — K080 Exfoliation of teeth due to systemic causes: Secondary | ICD-10-CM | POA: Diagnosis not present

## 2022-03-28 DIAGNOSIS — E291 Testicular hypofunction: Secondary | ICD-10-CM | POA: Diagnosis not present

## 2022-04-03 ENCOUNTER — Inpatient Hospital Stay: Payer: Self-pay | Admitting: Genetic Counselor

## 2022-04-04 DIAGNOSIS — E1142 Type 2 diabetes mellitus with diabetic polyneuropathy: Secondary | ICD-10-CM | POA: Diagnosis not present

## 2022-04-04 DIAGNOSIS — Z125 Encounter for screening for malignant neoplasm of prostate: Secondary | ICD-10-CM | POA: Diagnosis not present

## 2022-04-04 DIAGNOSIS — E291 Testicular hypofunction: Secondary | ICD-10-CM | POA: Diagnosis not present

## 2022-04-09 DIAGNOSIS — I129 Hypertensive chronic kidney disease with stage 1 through stage 4 chronic kidney disease, or unspecified chronic kidney disease: Secondary | ICD-10-CM | POA: Diagnosis not present

## 2022-04-09 DIAGNOSIS — E1122 Type 2 diabetes mellitus with diabetic chronic kidney disease: Secondary | ICD-10-CM | POA: Diagnosis not present

## 2022-04-09 DIAGNOSIS — Z1509 Genetic susceptibility to other malignant neoplasm: Secondary | ICD-10-CM | POA: Diagnosis not present

## 2022-04-09 DIAGNOSIS — N182 Chronic kidney disease, stage 2 (mild): Secondary | ICD-10-CM | POA: Diagnosis not present

## 2022-04-09 DIAGNOSIS — N179 Acute kidney failure, unspecified: Secondary | ICD-10-CM | POA: Diagnosis not present

## 2022-04-11 DIAGNOSIS — E291 Testicular hypofunction: Secondary | ICD-10-CM | POA: Diagnosis not present

## 2022-04-15 ENCOUNTER — Ambulatory Visit: Payer: MEDICAID

## 2022-04-15 DIAGNOSIS — I441 Atrioventricular block, second degree: Secondary | ICD-10-CM

## 2022-04-15 LAB — CUP PACEART REMOTE DEVICE CHECK
Battery Remaining Longevity: 124 mo
Battery Voltage: 3.03 V
Brady Statistic AP VP Percent: 1.46 %
Brady Statistic AP VS Percent: 0 %
Brady Statistic AS VP Percent: 98.03 %
Brady Statistic AS VS Percent: 0.52 %
Brady Statistic RA Percent Paced: 1.53 %
Brady Statistic RV Percent Paced: 99.48 %
Date Time Interrogation Session: 20240205191828
Implantable Lead Connection Status: 753985
Implantable Lead Connection Status: 753985
Implantable Lead Implant Date: 20230202
Implantable Lead Implant Date: 20230202
Implantable Lead Location: 753859
Implantable Lead Location: 753860
Implantable Lead Model: 3830
Implantable Lead Model: 5076
Implantable Pulse Generator Implant Date: 20230202
Lead Channel Impedance Value: 323 Ohm
Lead Channel Impedance Value: 342 Ohm
Lead Channel Impedance Value: 399 Ohm
Lead Channel Impedance Value: 437 Ohm
Lead Channel Pacing Threshold Amplitude: 0.375 V
Lead Channel Pacing Threshold Amplitude: 1 V
Lead Channel Pacing Threshold Pulse Width: 0.4 ms
Lead Channel Pacing Threshold Pulse Width: 0.4 ms
Lead Channel Sensing Intrinsic Amplitude: 3.5 mV
Lead Channel Sensing Intrinsic Amplitude: 3.5 mV
Lead Channel Sensing Intrinsic Amplitude: 6.875 mV
Lead Channel Sensing Intrinsic Amplitude: 6.875 mV
Lead Channel Setting Pacing Amplitude: 1.5 V
Lead Channel Setting Pacing Amplitude: 2.25 V
Lead Channel Setting Pacing Pulse Width: 0.4 ms
Lead Channel Setting Sensing Sensitivity: 1.2 mV
Zone Setting Status: 755011

## 2022-04-25 DIAGNOSIS — E291 Testicular hypofunction: Secondary | ICD-10-CM | POA: Diagnosis not present

## 2022-05-03 ENCOUNTER — Other Ambulatory Visit: Payer: Self-pay | Admitting: Cardiology

## 2022-05-05 ENCOUNTER — Encounter: Payer: Self-pay | Admitting: Nephrology

## 2022-05-07 DIAGNOSIS — E1140 Type 2 diabetes mellitus with diabetic neuropathy, unspecified: Secondary | ICD-10-CM | POA: Diagnosis not present

## 2022-05-07 DIAGNOSIS — M5416 Radiculopathy, lumbar region: Secondary | ICD-10-CM | POA: Diagnosis not present

## 2022-05-09 DIAGNOSIS — Z0000 Encounter for general adult medical examination without abnormal findings: Secondary | ICD-10-CM | POA: Diagnosis not present

## 2022-05-09 DIAGNOSIS — I129 Hypertensive chronic kidney disease with stage 1 through stage 4 chronic kidney disease, or unspecified chronic kidney disease: Secondary | ICD-10-CM | POA: Diagnosis not present

## 2022-05-09 DIAGNOSIS — E1129 Type 2 diabetes mellitus with other diabetic kidney complication: Secondary | ICD-10-CM | POA: Diagnosis not present

## 2022-05-09 DIAGNOSIS — E291 Testicular hypofunction: Secondary | ICD-10-CM | POA: Diagnosis not present

## 2022-05-09 DIAGNOSIS — Z125 Encounter for screening for malignant neoplasm of prostate: Secondary | ICD-10-CM | POA: Diagnosis not present

## 2022-05-13 DIAGNOSIS — M5416 Radiculopathy, lumbar region: Secondary | ICD-10-CM | POA: Diagnosis not present

## 2022-05-14 NOTE — Progress Notes (Signed)
Remote pacemaker transmission.   

## 2022-05-19 DIAGNOSIS — M2578 Osteophyte, vertebrae: Secondary | ICD-10-CM | POA: Diagnosis not present

## 2022-05-19 DIAGNOSIS — M47817 Spondylosis without myelopathy or radiculopathy, lumbosacral region: Secondary | ICD-10-CM | POA: Diagnosis not present

## 2022-05-19 DIAGNOSIS — M5459 Other low back pain: Secondary | ICD-10-CM | POA: Diagnosis not present

## 2022-05-19 DIAGNOSIS — M4817 Ankylosing hyperostosis [Forestier], lumbosacral region: Secondary | ICD-10-CM | POA: Diagnosis not present

## 2022-05-19 DIAGNOSIS — M4807 Spinal stenosis, lumbosacral region: Secondary | ICD-10-CM | POA: Diagnosis not present

## 2022-05-19 DIAGNOSIS — M5137 Other intervertebral disc degeneration, lumbosacral region: Secondary | ICD-10-CM | POA: Diagnosis not present

## 2022-05-20 ENCOUNTER — Other Ambulatory Visit: Payer: Self-pay | Admitting: Infectious Diseases

## 2022-05-20 DIAGNOSIS — B20 Human immunodeficiency virus [HIV] disease: Secondary | ICD-10-CM

## 2022-05-23 DIAGNOSIS — B20 Human immunodeficiency virus [HIV] disease: Secondary | ICD-10-CM | POA: Diagnosis not present

## 2022-05-23 DIAGNOSIS — E291 Testicular hypofunction: Secondary | ICD-10-CM | POA: Diagnosis not present

## 2022-05-23 DIAGNOSIS — E1129 Type 2 diabetes mellitus with other diabetic kidney complication: Secondary | ICD-10-CM | POA: Diagnosis not present

## 2022-05-23 DIAGNOSIS — Z0000 Encounter for general adult medical examination without abnormal findings: Secondary | ICD-10-CM | POA: Diagnosis not present

## 2022-05-23 DIAGNOSIS — I129 Hypertensive chronic kidney disease with stage 1 through stage 4 chronic kidney disease, or unspecified chronic kidney disease: Secondary | ICD-10-CM | POA: Diagnosis not present

## 2022-05-26 DIAGNOSIS — M545 Low back pain, unspecified: Secondary | ICD-10-CM | POA: Diagnosis not present

## 2022-06-06 DIAGNOSIS — Z87440 Personal history of urinary (tract) infections: Secondary | ICD-10-CM | POA: Diagnosis not present

## 2022-06-06 DIAGNOSIS — Z20822 Contact with and (suspected) exposure to covid-19: Secondary | ICD-10-CM | POA: Diagnosis not present

## 2022-06-06 DIAGNOSIS — R197 Diarrhea, unspecified: Secondary | ICD-10-CM | POA: Diagnosis not present

## 2022-06-06 DIAGNOSIS — R0981 Nasal congestion: Secondary | ICD-10-CM | POA: Diagnosis not present

## 2022-06-06 DIAGNOSIS — R509 Fever, unspecified: Secondary | ICD-10-CM | POA: Diagnosis not present

## 2022-06-10 DIAGNOSIS — M9901 Segmental and somatic dysfunction of cervical region: Secondary | ICD-10-CM | POA: Diagnosis not present

## 2022-06-10 DIAGNOSIS — M545 Low back pain, unspecified: Secondary | ICD-10-CM | POA: Diagnosis not present

## 2022-06-10 DIAGNOSIS — M9903 Segmental and somatic dysfunction of lumbar region: Secondary | ICD-10-CM | POA: Diagnosis not present

## 2022-06-10 DIAGNOSIS — M62830 Muscle spasm of back: Secondary | ICD-10-CM | POA: Diagnosis not present

## 2022-06-13 DIAGNOSIS — E291 Testicular hypofunction: Secondary | ICD-10-CM | POA: Diagnosis not present

## 2022-06-16 DIAGNOSIS — M9901 Segmental and somatic dysfunction of cervical region: Secondary | ICD-10-CM | POA: Diagnosis not present

## 2022-06-16 DIAGNOSIS — M9903 Segmental and somatic dysfunction of lumbar region: Secondary | ICD-10-CM | POA: Diagnosis not present

## 2022-06-16 DIAGNOSIS — M62830 Muscle spasm of back: Secondary | ICD-10-CM | POA: Diagnosis not present

## 2022-06-16 DIAGNOSIS — M545 Low back pain, unspecified: Secondary | ICD-10-CM | POA: Diagnosis not present

## 2022-06-18 DIAGNOSIS — M62830 Muscle spasm of back: Secondary | ICD-10-CM | POA: Diagnosis not present

## 2022-06-18 DIAGNOSIS — M545 Low back pain, unspecified: Secondary | ICD-10-CM | POA: Diagnosis not present

## 2022-06-18 DIAGNOSIS — M9903 Segmental and somatic dysfunction of lumbar region: Secondary | ICD-10-CM | POA: Diagnosis not present

## 2022-06-18 DIAGNOSIS — M9901 Segmental and somatic dysfunction of cervical region: Secondary | ICD-10-CM | POA: Diagnosis not present

## 2022-06-19 DIAGNOSIS — M545 Low back pain, unspecified: Secondary | ICD-10-CM | POA: Diagnosis not present

## 2022-06-19 DIAGNOSIS — M62830 Muscle spasm of back: Secondary | ICD-10-CM | POA: Diagnosis not present

## 2022-06-19 DIAGNOSIS — M9903 Segmental and somatic dysfunction of lumbar region: Secondary | ICD-10-CM | POA: Diagnosis not present

## 2022-06-19 DIAGNOSIS — M9901 Segmental and somatic dysfunction of cervical region: Secondary | ICD-10-CM | POA: Diagnosis not present

## 2022-06-23 DIAGNOSIS — M9901 Segmental and somatic dysfunction of cervical region: Secondary | ICD-10-CM | POA: Diagnosis not present

## 2022-06-23 DIAGNOSIS — M545 Low back pain, unspecified: Secondary | ICD-10-CM | POA: Diagnosis not present

## 2022-06-23 DIAGNOSIS — M9903 Segmental and somatic dysfunction of lumbar region: Secondary | ICD-10-CM | POA: Diagnosis not present

## 2022-06-23 DIAGNOSIS — M62830 Muscle spasm of back: Secondary | ICD-10-CM | POA: Diagnosis not present

## 2022-06-25 DIAGNOSIS — M9903 Segmental and somatic dysfunction of lumbar region: Secondary | ICD-10-CM | POA: Diagnosis not present

## 2022-06-25 DIAGNOSIS — M545 Low back pain, unspecified: Secondary | ICD-10-CM | POA: Diagnosis not present

## 2022-06-25 DIAGNOSIS — M62830 Muscle spasm of back: Secondary | ICD-10-CM | POA: Diagnosis not present

## 2022-06-25 DIAGNOSIS — M9901 Segmental and somatic dysfunction of cervical region: Secondary | ICD-10-CM | POA: Diagnosis not present

## 2022-06-26 DIAGNOSIS — M9903 Segmental and somatic dysfunction of lumbar region: Secondary | ICD-10-CM | POA: Diagnosis not present

## 2022-06-26 DIAGNOSIS — M9901 Segmental and somatic dysfunction of cervical region: Secondary | ICD-10-CM | POA: Diagnosis not present

## 2022-06-26 DIAGNOSIS — M62830 Muscle spasm of back: Secondary | ICD-10-CM | POA: Diagnosis not present

## 2022-06-26 DIAGNOSIS — M545 Low back pain, unspecified: Secondary | ICD-10-CM | POA: Diagnosis not present

## 2022-06-27 DIAGNOSIS — E291 Testicular hypofunction: Secondary | ICD-10-CM | POA: Diagnosis not present

## 2022-07-01 DIAGNOSIS — M545 Low back pain, unspecified: Secondary | ICD-10-CM | POA: Diagnosis not present

## 2022-07-01 DIAGNOSIS — M62830 Muscle spasm of back: Secondary | ICD-10-CM | POA: Diagnosis not present

## 2022-07-01 DIAGNOSIS — M9901 Segmental and somatic dysfunction of cervical region: Secondary | ICD-10-CM | POA: Diagnosis not present

## 2022-07-01 DIAGNOSIS — M9903 Segmental and somatic dysfunction of lumbar region: Secondary | ICD-10-CM | POA: Diagnosis not present

## 2022-07-02 DIAGNOSIS — M62830 Muscle spasm of back: Secondary | ICD-10-CM | POA: Diagnosis not present

## 2022-07-02 DIAGNOSIS — M545 Low back pain, unspecified: Secondary | ICD-10-CM | POA: Diagnosis not present

## 2022-07-02 DIAGNOSIS — M9901 Segmental and somatic dysfunction of cervical region: Secondary | ICD-10-CM | POA: Diagnosis not present

## 2022-07-02 DIAGNOSIS — M9903 Segmental and somatic dysfunction of lumbar region: Secondary | ICD-10-CM | POA: Diagnosis not present

## 2022-07-03 DIAGNOSIS — M545 Low back pain, unspecified: Secondary | ICD-10-CM | POA: Diagnosis not present

## 2022-07-03 DIAGNOSIS — M9903 Segmental and somatic dysfunction of lumbar region: Secondary | ICD-10-CM | POA: Diagnosis not present

## 2022-07-03 DIAGNOSIS — M9901 Segmental and somatic dysfunction of cervical region: Secondary | ICD-10-CM | POA: Diagnosis not present

## 2022-07-03 DIAGNOSIS — M62830 Muscle spasm of back: Secondary | ICD-10-CM | POA: Diagnosis not present

## 2022-07-07 DIAGNOSIS — M62830 Muscle spasm of back: Secondary | ICD-10-CM | POA: Diagnosis not present

## 2022-07-07 DIAGNOSIS — M9903 Segmental and somatic dysfunction of lumbar region: Secondary | ICD-10-CM | POA: Diagnosis not present

## 2022-07-07 DIAGNOSIS — M9901 Segmental and somatic dysfunction of cervical region: Secondary | ICD-10-CM | POA: Diagnosis not present

## 2022-07-07 DIAGNOSIS — M545 Low back pain, unspecified: Secondary | ICD-10-CM | POA: Diagnosis not present

## 2022-07-09 DIAGNOSIS — M62830 Muscle spasm of back: Secondary | ICD-10-CM | POA: Diagnosis not present

## 2022-07-09 DIAGNOSIS — M9903 Segmental and somatic dysfunction of lumbar region: Secondary | ICD-10-CM | POA: Diagnosis not present

## 2022-07-09 DIAGNOSIS — M62838 Other muscle spasm: Secondary | ICD-10-CM | POA: Diagnosis not present

## 2022-07-09 DIAGNOSIS — M545 Low back pain, unspecified: Secondary | ICD-10-CM | POA: Diagnosis not present

## 2022-07-09 DIAGNOSIS — M9901 Segmental and somatic dysfunction of cervical region: Secondary | ICD-10-CM | POA: Diagnosis not present

## 2022-07-15 ENCOUNTER — Ambulatory Visit (INDEPENDENT_AMBULATORY_CARE_PROVIDER_SITE_OTHER): Payer: Medicare Other

## 2022-07-15 DIAGNOSIS — I441 Atrioventricular block, second degree: Secondary | ICD-10-CM | POA: Diagnosis not present

## 2022-07-15 LAB — CUP PACEART REMOTE DEVICE CHECK
Battery Remaining Longevity: 106 mo
Battery Voltage: 3.02 V
Brady Statistic AP VP Percent: 0.6 %
Brady Statistic AP VS Percent: 0 %
Brady Statistic AS VP Percent: 98.89 %
Brady Statistic AS VS Percent: 0.51 %
Brady Statistic RA Percent Paced: 0.69 %
Brady Statistic RV Percent Paced: 99.49 %
Date Time Interrogation Session: 20240506222604
Implantable Lead Connection Status: 753985
Implantable Lead Connection Status: 753985
Implantable Lead Implant Date: 20230202
Implantable Lead Implant Date: 20230202
Implantable Lead Location: 753859
Implantable Lead Location: 753860
Implantable Lead Model: 3830
Implantable Lead Model: 5076
Implantable Pulse Generator Implant Date: 20230202
Lead Channel Impedance Value: 323 Ohm
Lead Channel Impedance Value: 342 Ohm
Lead Channel Impedance Value: 399 Ohm
Lead Channel Impedance Value: 437 Ohm
Lead Channel Pacing Threshold Amplitude: 0.375 V
Lead Channel Pacing Threshold Amplitude: 1.25 V
Lead Channel Pacing Threshold Pulse Width: 0.4 ms
Lead Channel Pacing Threshold Pulse Width: 0.4 ms
Lead Channel Sensing Intrinsic Amplitude: 3.5 mV
Lead Channel Sensing Intrinsic Amplitude: 3.5 mV
Lead Channel Sensing Intrinsic Amplitude: 4.5 mV
Lead Channel Sensing Intrinsic Amplitude: 4.5 mV
Lead Channel Setting Pacing Amplitude: 1.5 V
Lead Channel Setting Pacing Amplitude: 2.75 V
Lead Channel Setting Pacing Pulse Width: 0.4 ms
Lead Channel Setting Sensing Sensitivity: 1.2 mV
Zone Setting Status: 755011

## 2022-07-18 DIAGNOSIS — E291 Testicular hypofunction: Secondary | ICD-10-CM | POA: Diagnosis not present

## 2022-08-06 NOTE — Progress Notes (Signed)
Remote pacemaker transmission.   

## 2022-08-08 DIAGNOSIS — E291 Testicular hypofunction: Secondary | ICD-10-CM | POA: Diagnosis not present

## 2022-08-11 DIAGNOSIS — Z87440 Personal history of urinary (tract) infections: Secondary | ICD-10-CM | POA: Diagnosis not present

## 2022-08-11 DIAGNOSIS — R509 Fever, unspecified: Secondary | ICD-10-CM | POA: Diagnosis not present

## 2022-08-12 DIAGNOSIS — R161 Splenomegaly, not elsewhere classified: Secondary | ICD-10-CM | POA: Diagnosis not present

## 2022-08-12 DIAGNOSIS — K7460 Unspecified cirrhosis of liver: Secondary | ICD-10-CM | POA: Diagnosis not present

## 2022-08-12 DIAGNOSIS — K766 Portal hypertension: Secondary | ICD-10-CM | POA: Diagnosis not present

## 2022-08-12 DIAGNOSIS — C184 Malignant neoplasm of transverse colon: Secondary | ICD-10-CM | POA: Diagnosis not present

## 2022-08-14 DIAGNOSIS — C184 Malignant neoplasm of transverse colon: Secondary | ICD-10-CM | POA: Diagnosis not present

## 2022-08-14 DIAGNOSIS — C189 Malignant neoplasm of colon, unspecified: Secondary | ICD-10-CM | POA: Diagnosis not present

## 2022-08-14 DIAGNOSIS — Z1509 Genetic susceptibility to other malignant neoplasm: Secondary | ICD-10-CM | POA: Diagnosis not present

## 2022-08-22 DIAGNOSIS — E291 Testicular hypofunction: Secondary | ICD-10-CM | POA: Diagnosis not present

## 2022-08-25 DIAGNOSIS — C184 Malignant neoplasm of transverse colon: Secondary | ICD-10-CM | POA: Diagnosis not present

## 2022-09-03 DIAGNOSIS — I129 Hypertensive chronic kidney disease with stage 1 through stage 4 chronic kidney disease, or unspecified chronic kidney disease: Secondary | ICD-10-CM | POA: Diagnosis not present

## 2022-09-03 DIAGNOSIS — E1122 Type 2 diabetes mellitus with diabetic chronic kidney disease: Secondary | ICD-10-CM | POA: Diagnosis not present

## 2022-09-03 DIAGNOSIS — N182 Chronic kidney disease, stage 2 (mild): Secondary | ICD-10-CM | POA: Diagnosis not present

## 2022-09-03 DIAGNOSIS — N179 Acute kidney failure, unspecified: Secondary | ICD-10-CM | POA: Diagnosis not present

## 2022-09-04 LAB — LAB REPORT - SCANNED: Creatinine, POC: 41.1 mg/dL

## 2022-09-10 ENCOUNTER — Other Ambulatory Visit (HOSPITAL_COMMUNITY)
Admission: RE | Admit: 2022-09-10 | Discharge: 2022-09-10 | Disposition: A | Discharge status: Home / Self Care | Payer: Medicare Other | Source: Ambulatory Visit | Attending: Internal Medicine | Admitting: Internal Medicine

## 2022-09-10 ENCOUNTER — Other Ambulatory Visit (INDEPENDENT_AMBULATORY_CARE_PROVIDER_SITE_OTHER): Payer: Medicare Other

## 2022-09-10 DIAGNOSIS — Z79899 Other long term (current) drug therapy: Secondary | ICD-10-CM

## 2022-09-10 DIAGNOSIS — Z1509 Genetic susceptibility to other malignant neoplasm: Secondary | ICD-10-CM

## 2022-09-10 DIAGNOSIS — Z113 Encounter for screening for infections with a predominantly sexual mode of transmission: Secondary | ICD-10-CM | POA: Insufficient documentation

## 2022-09-10 DIAGNOSIS — B20 Human immunodeficiency virus [HIV] disease: Secondary | ICD-10-CM | POA: Diagnosis not present

## 2022-09-10 DIAGNOSIS — Z15060 Genetic susceptibility to colorectal cancer: Secondary | ICD-10-CM

## 2022-09-10 LAB — T-HELPER CELL (CD4) - (RCID CLINIC ONLY)
CD4 % Helper T Cell: 15 % — ABNORMAL LOW (ref 33–65)
CD4 T Cell Abs: 378 /uL — ABNORMAL LOW (ref 400–1790)

## 2022-09-11 LAB — LIPID PANEL

## 2022-09-11 LAB — CBC
Hemoglobin: 15.3 g/dL (ref 13.0–17.7)
MCHC: 33.6 g/dL (ref 31.5–35.7)
WBC: 7.5 10*3/uL (ref 3.4–10.8)

## 2022-09-11 LAB — COMPREHENSIVE METABOLIC PANEL

## 2022-09-11 LAB — PSA

## 2022-09-12 LAB — CBC
Hematocrit: 45.6 % (ref 37.5–51.0)
MCH: 30.2 pg (ref 26.6–33.0)
Platelets: 206 10*3/uL (ref 150–450)
RDW: 14.6 % (ref 11.6–15.4)

## 2022-09-12 LAB — URINE CYTOLOGY ANCILLARY ONLY
Chlamydia: NEGATIVE
Comment: NEGATIVE
Comment: NORMAL
Neisseria Gonorrhea: NEGATIVE

## 2022-09-12 LAB — COMPREHENSIVE METABOLIC PANEL

## 2022-09-12 LAB — LIPID PANEL

## 2022-09-12 IMAGING — DX DG CHEST 2V
2 series · 2 of 2 positions shown · non-contrast
Comparison: Radiograph 12/31/2015, chest CT 01/01/2016

CLINICAL DATA: Postop for cardiac device

EXAM:
CHEST - 2 VIEW

[w chest pa]
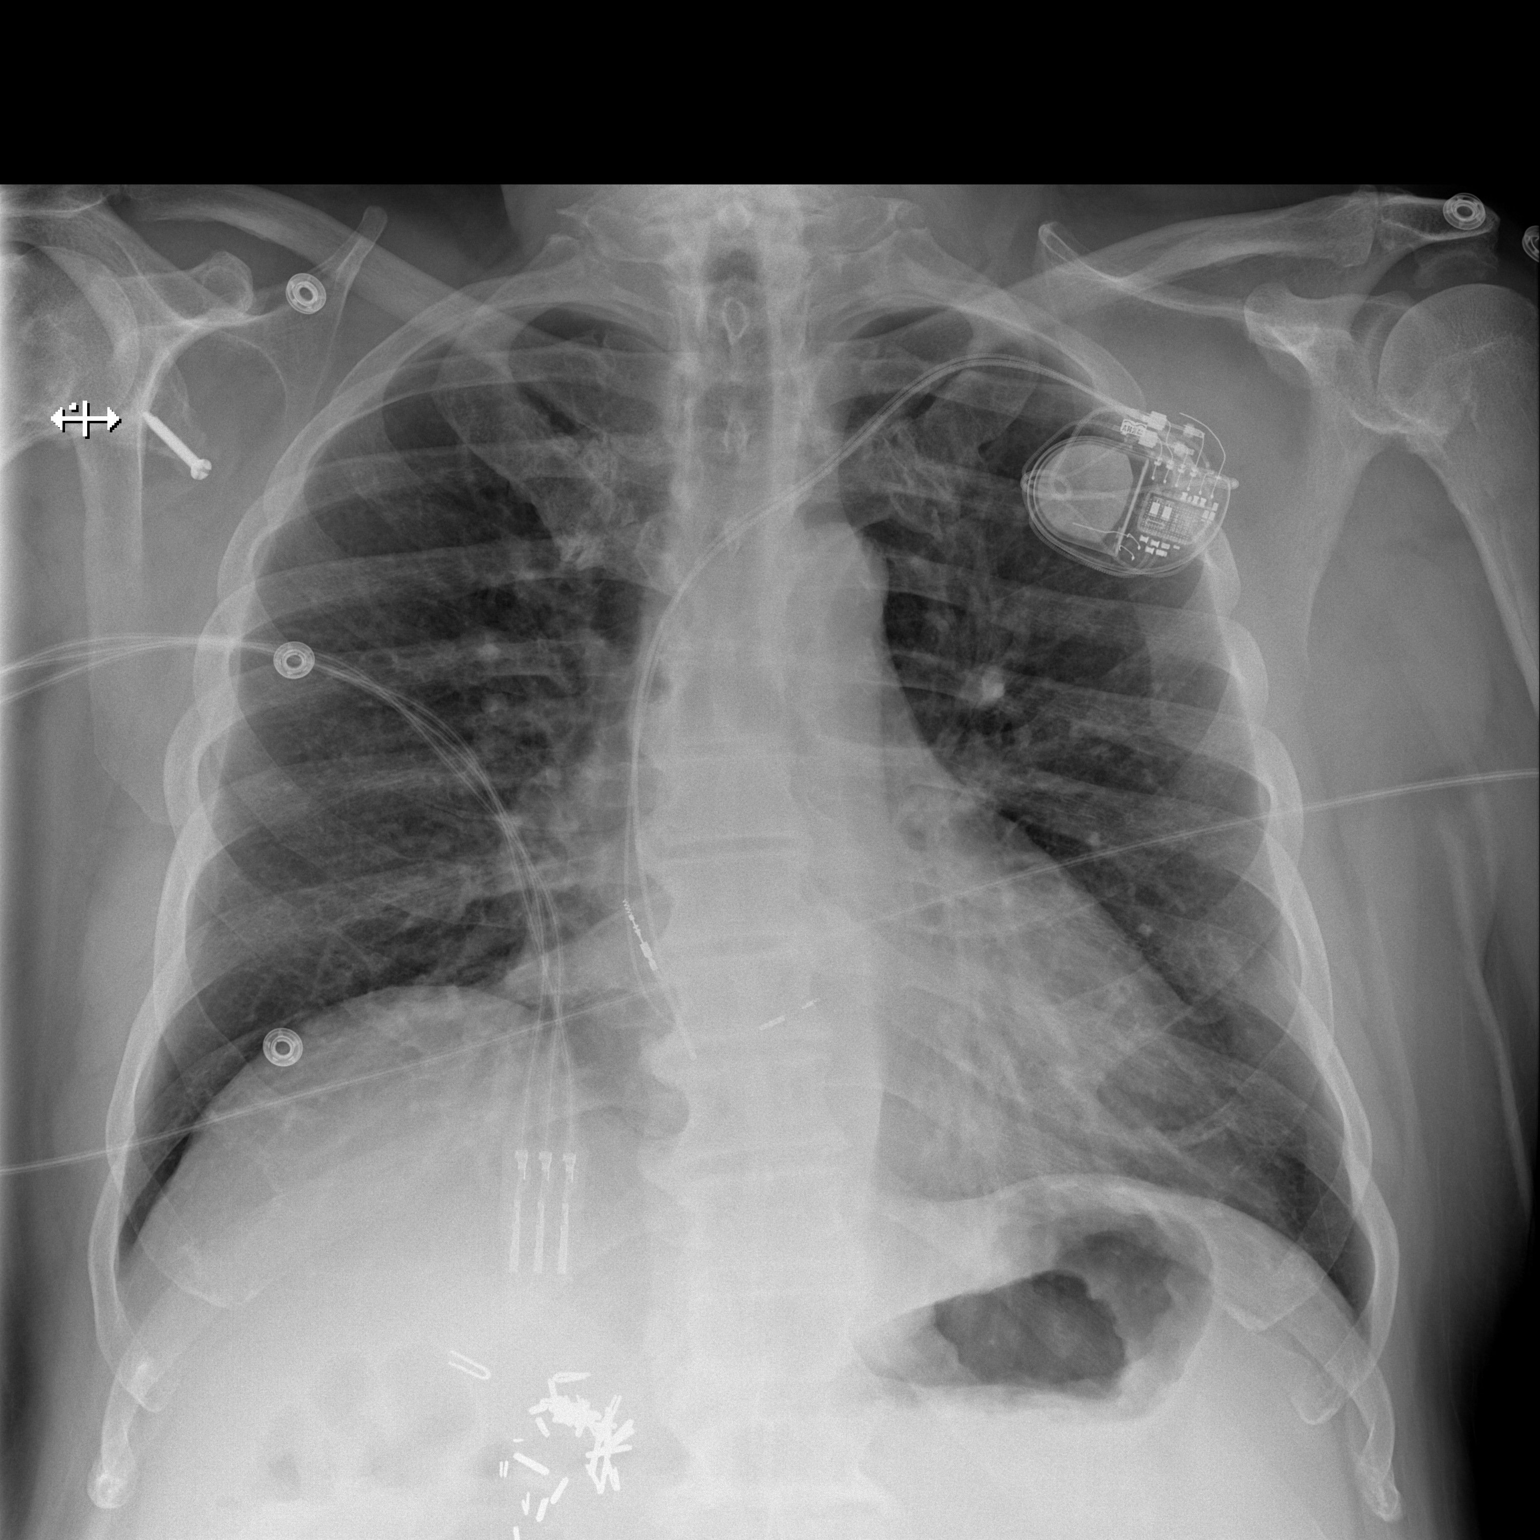

[w chest lat]
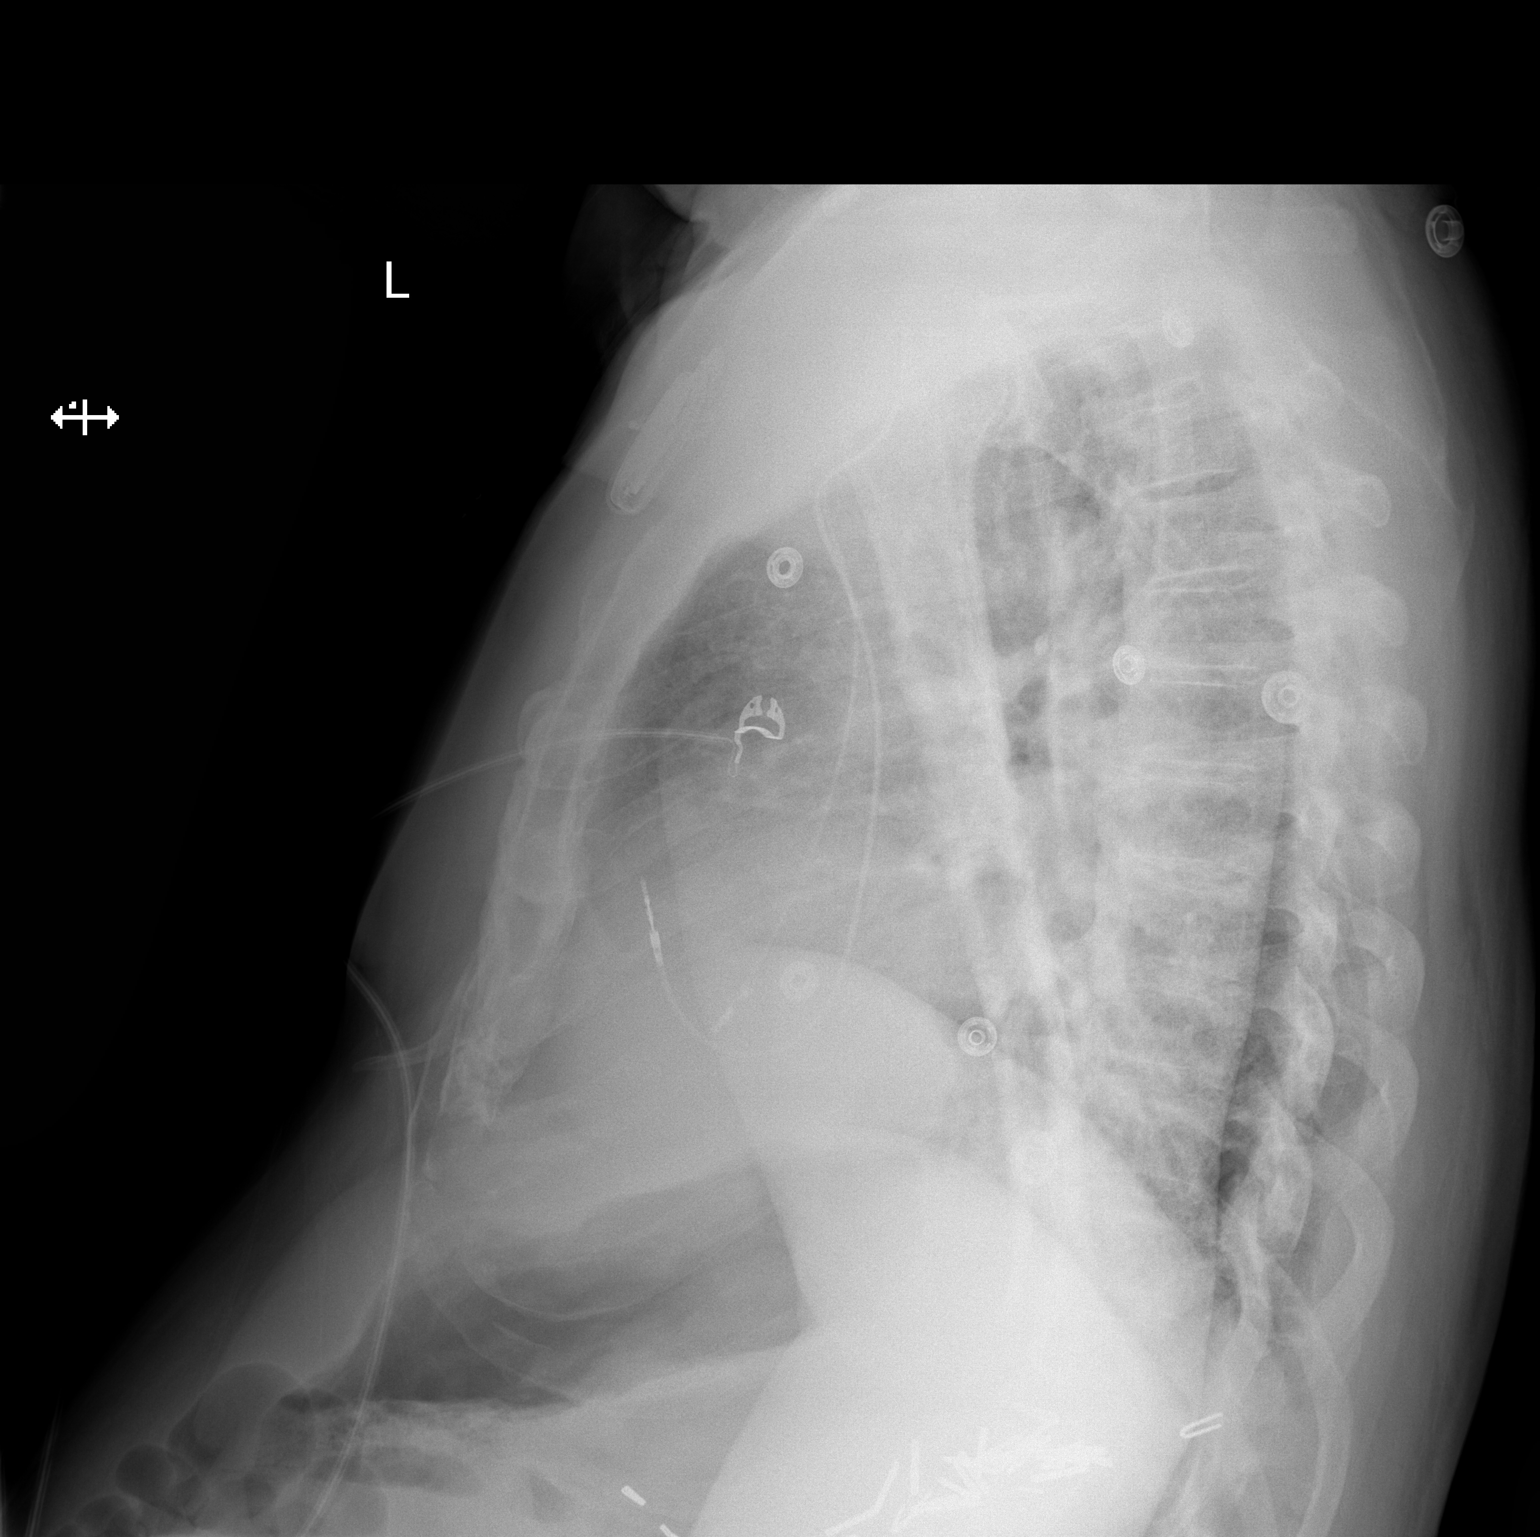

[2 of 2 positions shown; findings below may reference images not displayed]

FINDINGS: New dual chamber pacemaker with leads overlying the right atrium and
right ventricle. Unchanged size of the cardiomediastinal silhouette.
No focal airspace disease. No pleural effusion. No visible
pneumothorax. Bilateral shoulder degenerative changes. Prior right
glenoid augmentation procedure. Left shoulder degenerative changes.
IMPRESSION: New dual chamber pacemaker with leads overlying the right atrium and
right ventricle. No visible pneumothorax.

## 2022-09-13 LAB — HIV-1 RNA QUANT-NO REFLEX-BLD: HIV-1 RNA Viral Load: <20 copies/mL

## 2022-09-13 LAB — LIPID PANEL
Chol/HDL Ratio: 2.7 ratio (ref 0.0–5.0)
HDL: 31 mg/dL — ABNORMAL LOW (ref >39)
Triglycerides: 302 mg/dL — ABNORMAL HIGH (ref 0–149)

## 2022-09-13 LAB — COMPREHENSIVE METABOLIC PANEL
ALT: 15 IU/L (ref 0–44)
Albumin: 4.4 g/dL (ref 3.9–4.9)
BUN/Creatinine Ratio: 12 (ref 10–24)
Bilirubin Total: 2 mg/dL — ABNORMAL HIGH (ref 0.0–1.2)
Total Protein: 7.4 g/dL (ref 6.0–8.5)
eGFR: 52 mL/min/{1.73_m2} — ABNORMAL LOW (ref >59)

## 2022-09-13 LAB — RPR: RPR Ser Ql: NONREACTIVE

## 2022-09-13 LAB — CBC
MCV: 90 fL (ref 79–97)
RBC: 5.06 x10E6/uL (ref 4.14–5.80)

## 2022-09-24 ENCOUNTER — Encounter: Payer: Self-pay | Admitting: Infectious Diseases

## 2022-09-24 ENCOUNTER — Ambulatory Visit: Payer: Medicare Other | Admitting: Infectious Diseases

## 2022-09-24 VITALS — BP 132/72 | HR 92 | Temp 98.3°F | Resp 28 | Ht 69.0 in | Wt 219.1 lb

## 2022-09-24 DIAGNOSIS — E1142 Type 2 diabetes mellitus with diabetic polyneuropathy: Secondary | ICD-10-CM

## 2022-09-24 DIAGNOSIS — R509 Fever, unspecified: Secondary | ICD-10-CM | POA: Insufficient documentation

## 2022-09-24 DIAGNOSIS — B20 Human immunodeficiency virus [HIV] disease: Secondary | ICD-10-CM | POA: Diagnosis not present

## 2022-09-24 DIAGNOSIS — Z79899 Other long term (current) drug therapy: Secondary | ICD-10-CM

## 2022-09-24 DIAGNOSIS — E1121 Type 2 diabetes mellitus with diabetic nephropathy: Secondary | ICD-10-CM

## 2022-09-24 DIAGNOSIS — Z113 Encounter for screening for infections with a predominantly sexual mode of transmission: Secondary | ICD-10-CM

## 2022-09-24 DIAGNOSIS — T883XXA Malignant hyperthermia due to anesthesia, initial encounter: Secondary | ICD-10-CM

## 2022-09-24 NOTE — Assessment & Plan Note (Signed)
Had slight increase in Cr over last year. His last measurements at Dr [facility] office have been 1.41/1.49. he does have proteinuria.  I appreciate his excellent care.

## 2022-09-24 NOTE — Assessment & Plan Note (Addendum)
He is doing very well We discussed his labs- he had a drop in his CD4 but feel this is not significant.  Will continue odefsy.  Asked him to call with results of his CT scan.  He will get COVID/Flu vax when available. RSV as well.  PCV 20 due 2028.  Will see him in 9 months.

## 2022-09-24 NOTE — Progress Notes (Signed)
Subjective:    Patient ID: Joshua Duty., male  DOB: 1950-09-23, 72 y.o.        MRN: [id number]   HPI 72 yo M with hx of hemochromatosis, DM2 (dx 2007), secondary hypogonadism (on clomid), CKD3, hyperlipidemia, lumbar laminectomy/decompression (04-2011), and HIV+ since 1990 when he was dx on insurance physical.  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  He has been maintained on atripla --> changed to [facility] 2017.  He was found to have stage II colon cancer 07-2021, R colectomy. Was dx with Lynch syndrome (repeat colon 01-2022). He had an abn 6 months f/u CT scan recently-has had 2 episodes of sudden onset of fever. Up to 104. Lasts ~ 24h. Was seen in UC, testing was (-). 6 weeks later had repeat episode.  1.  There is new stranding in the central small bowel mesentery with scattered subcentimeter lymph nodes. This is a nonspecific finding with primary differential consisting of a nonspecific mesenteritis. Close attention on follow-up is advised. Cirrhosis and portal hypertension with splenomegaly.  2.  Otherwise, no evidence of new or progressive metastatic disease status post right hemicolectomy and adrenalectomy.  Plan for repeat CT in early September.  Repeat colon 01-2023.  His CEA have all been normal (< 1.5). Tying not to think about it. No issues with his odefsey. Has not had trouble paying for.   Has retired, and is quite happy about it.  Wants to know if he needs COVID vax prior to travel to [place].   Has questions about Cr. See Dr Arrie Aran.   His A1C have been less than 6, sees Dr Sharl Ma. Has ophtho, podiatry appt next month.    HIV 1 RNA Quant  Date Value  06/21/2021 NOT DETECTED copies/mL  01/30/2021 Not Detected Copies/mL  05/01/2020 <20 Copies/mL   HIV-1 RNA Viral Load (copies/mL)  Date Value  09/10/2022 <20  12/13/2021 <20   CD4 T Cell Abs (/uL)  Date Value  09/10/2022 378 (L)  12/13/2021 425  05/01/2020 549     Health Maintenance  Topic  Date Due  . Medicare Annual Wellness (AWV)  Never done  . Diabetic kidney evaluation - Urine ACR  01/04/2008  . OPHTHALMOLOGY EXAM  02/07/2018  . FOOT EXAM  07/24/2021  . COVID-19 Vaccine (4 - 2023-24 season) 11/08/2021  . HEMOGLOBIN A1C  12/21/2021  . INFLUENZA VACCINE  10/09/2022  . Diabetic kidney evaluation - eGFR measurement  09/10/2023  . Colonoscopy  05/28/2026  . DTaP/Tdap/Td (3 - Td or Tdap) 04/16/2027  . Pneumonia Vaccine 45+ Years old  Completed  . Hepatitis C Screening  Completed  . Zoster Vaccines- Shingrix  Completed  . HPV VACCINES  Aged Out      Review of Systems  Constitutional:  Negative for chills, fever and weight loss.  Eyes:  Negative for blurred vision and double vision.  Respiratory:  Negative for cough and shortness of breath.   Gastrointestinal:  Negative for constipation and diarrhea.  Genitourinary:  Negative for dysuria.  Neurological:  Positive for sensory change.   Please see HPI. All other systems reviewed and negative.     Objective:  Physical Exam Vitals reviewed.  Constitutional:      Appearance: Normal appearance. He is obese.  HENT:     Mouth/Throat:     Mouth: Mucous membranes are moist.     Pharynx: No oropharyngeal exudate.  Cardiovascular:     Rate and Rhythm: Normal rate and regular rhythm.  Pulses:          Dorsalis pedis pulses are 3+ on the right side and 3+ on the left side.       Posterior tibial pulses are 3+ on the right side and 3+ on the left side.  Pulmonary:     Effort: Pulmonary effort is normal.     Breath sounds: Normal breath sounds.  Abdominal:     General: Bowel sounds are normal. There is no distension.     Palpations: Abdomen is soft.     Tenderness: There is no abdominal tenderness.  Musculoskeletal:        General: Normal range of motion.     Cervical back: Normal range of motion and neck supple.     Right lower leg: No edema.     Left lower leg: No edema.  Feet:     Right foot:     Protective  Sensation:  1 site tested.  1 site sensed.    Skin integrity: Skin integrity normal.     Left foot:     Protective Sensation:  1 site tested. 0 sites sensed.     Skin integrity: Callus present.  Neurological:     General: No focal deficit present.     Mental Status: He is alert and oriented to person, place, and time.     Sensory: Sensory deficit present.  Psychiatric:        Mood and Affect: Mood normal.          Assessment & Plan:

## 2022-09-24 NOTE — Assessment & Plan Note (Signed)
He appears to be doing well His vision is stable, as is his sensation.  His A1C have been excellent.  Appreciate Dr Daune Perch excellent care.

## 2022-09-24 NOTE — Assessment & Plan Note (Signed)
Etiology is unclear.  Query if related to his GI disease. He is afebrile here.  Asked him to call if further episodes.  Await results of his repeat CT scan.

## 2022-09-24 NOTE — Assessment & Plan Note (Signed)
His feet are in good shape. He has a small callus over his L 1st MT head.  Appreciate podiatry f/u

## 2022-09-26 DIAGNOSIS — E291 Testicular hypofunction: Secondary | ICD-10-CM | POA: Diagnosis not present

## 2022-09-30 DIAGNOSIS — L905 Scar conditions and fibrosis of skin: Secondary | ICD-10-CM | POA: Diagnosis not present

## 2022-09-30 DIAGNOSIS — L821 Other seborrheic keratosis: Secondary | ICD-10-CM | POA: Diagnosis not present

## 2022-09-30 DIAGNOSIS — Z949 Transplanted organ and tissue status, unspecified: Secondary | ICD-10-CM | POA: Diagnosis not present

## 2022-09-30 DIAGNOSIS — Z85828 Personal history of other malignant neoplasm of skin: Secondary | ICD-10-CM | POA: Diagnosis not present

## 2022-09-30 DIAGNOSIS — D225 Melanocytic nevi of trunk: Secondary | ICD-10-CM | POA: Diagnosis not present

## 2022-10-06 DIAGNOSIS — K080 Exfoliation of teeth due to systemic causes: Secondary | ICD-10-CM | POA: Diagnosis not present

## 2022-10-10 DIAGNOSIS — E291 Testicular hypofunction: Secondary | ICD-10-CM | POA: Diagnosis not present

## 2022-10-13 DIAGNOSIS — N182 Chronic kidney disease, stage 2 (mild): Secondary | ICD-10-CM | POA: Diagnosis not present

## 2022-10-13 DIAGNOSIS — B20 Human immunodeficiency virus [HIV] disease: Secondary | ICD-10-CM | POA: Diagnosis not present

## 2022-10-14 ENCOUNTER — Ambulatory Visit: Payer: Medicare Other

## 2022-10-14 DIAGNOSIS — I441 Atrioventricular block, second degree: Secondary | ICD-10-CM | POA: Diagnosis not present

## 2022-10-20 ENCOUNTER — Other Ambulatory Visit: Payer: Self-pay | Admitting: Infectious Diseases

## 2022-10-20 DIAGNOSIS — B20 Human immunodeficiency virus [HIV] disease: Secondary | ICD-10-CM

## 2022-10-23 DIAGNOSIS — E291 Testicular hypofunction: Secondary | ICD-10-CM | POA: Diagnosis not present

## 2022-10-23 DIAGNOSIS — E1142 Type 2 diabetes mellitus with diabetic polyneuropathy: Secondary | ICD-10-CM | POA: Diagnosis not present

## 2022-10-30 NOTE — Progress Notes (Signed)
Remote pacemaker transmission.   

## 2022-11-05 ENCOUNTER — Telehealth: Payer: Self-pay

## 2022-11-05 NOTE — Telephone Encounter (Signed)
Apt scheduled with Otilio Saber 11/12/22

## 2022-11-05 NOTE — Telephone Encounter (Signed)
Device alert for AF in progress from 8/24, controlled rates Hx of PAF, no OAC due to low burden Burden 0.1% - route to triage for awareness LA, CVRS  Burden is unchanged. Pt is overdue for in-clinic apt. Message sent to EP scheduling for apt. LW  Msg received from scheduler unable to get patient into EP until provider, spoke with patient had him send a remote transmission, patient had a total of 4hr of AF on 11/01/22 apt scheduled with AF clinic

## 2022-11-07 ENCOUNTER — Ambulatory Visit (HOSPITAL_COMMUNITY): Payer: Medicare Other | Admitting: Internal Medicine

## 2022-11-07 DIAGNOSIS — E291 Testicular hypofunction: Secondary | ICD-10-CM | POA: Diagnosis not present

## 2022-11-11 NOTE — Progress Notes (Signed)
  Electrophysiology Office Note:   Date:  11/12/2022  ID:  Joshua Duty., DOB [birth year], MRN [id number]  Primary Cardiologist: None Electrophysiologist: Will Jorja Loa, MD       History of Present Illness:   Joshua Moscone. is a 72 y.o. male with h/o HTN, Mobitz I AVB, HLD, CKD, NAFLD, HIV, DM seen today for routine electrophysiology followup.   Seen in EP Clinic 07/2021 and was doing well.  Last remote device check 10/23/22 showed normal device function, battery / leads stable per Dr. Elberta Fortis.   Since last being seen in our clinic the patient reports doing well. Occasionally feels fatigue / less energy. Following closely with [facility] after his right colectomy in 02/2022 for colon cancer.  He was identified AF on his device with longest episode of 4h, asymptomatic.    He denies chest pain, palpitations, dyspnea, PND, orthopnea, nausea, vomiting, dizziness, syncope, edema, weight gain, or early satiety.   Review of systems complete and found to be negative unless listed in HPI.    EP Information / Studies Reviewed:    EKG is ordered today. Personal review as below.  EKG Interpretation Date/Time:  Wednesday November 12 2022 11:58:13 EDT Ventricular Rate:  72 PR Interval:  218 QRS Duration:  152 QT Interval:  392 QTC Calculation: 429 R Axis:   28  Text Interpretation: Atrial-sensed ventricular-paced rhythm with prolonged AV conduction When compared with ECG of 12-Apr-2021 04:13, Vent. rate has decreased BY  19 BPM Confirmed by Canary Brim ([id number]) on 11/12/2022 12:56:09 PM   PPM Interrogation-  reviewed in detail today,  See PACEART report.  Device History: Medtronic Dual Chamber PPM implanted 04/11/2021 for Second Degree AV block  Studies ECHO 2017 > EF 60-65%  Risk Assessment/Calculations:    CHA2DS2-VASc Score = 3   This indicates a 3.2% annual risk of stroke. The patient's score is based upon: CHF History: 0 HTN History: 1 Diabetes History: 1 Stroke History:  0 Vascular Disease History: 0 Age Score: 1 Gender Score: 0             Physical Exam:   VS:  BP 136/76   Pulse 72   Ht 5\' 9"  (1.753 m)   Wt 224 lb (101.6 kg)   SpO2 97%   BMI 33.08 kg/m    Wt Readings from Last 3 Encounters:  11/12/22 224 lb (101.6 kg)  09/24/22 219 lb 1.6 oz (99.4 kg)  12/24/21 228 lb 3.2 oz (103.5 kg)     GEN: Well nourished, well developed in no acute distress NECK: No JVD; No carotid bruits CARDIAC: Regular rate and rhythm, no murmurs, rubs, gallops RESPIRATORY:  Clear to auscultation without rales, wheezing or rhonchi  ABDOMEN: Soft, non-tender, non-distended EXTREMITIES:  No edema; No deformity   ASSESSMENT AND PLAN:    Second Degree AV block Type I & II s/p Medtronic PPM  -Normal PPM function -See Pace Art report -No changes today  Hx of PAF CHA2DS2-VASc 3 / 3.2 % annual risk of CVA -one episode of AF on device, lasting 4h, 40 min  -reviewed anticoagulation risks / benefits with patient and he elects to move forward with OAC for stroke protection  -CBC, BMP today and again in one month   HTN -well controlled    HLD  -per primary   Disposition:   Follow up with EP APP  1 month   Signed, Canary Brim, MSN, APRN, NP-C, AGACNP-BC [facility] - Electrophysiology  11/12/2022, 1:00 PM

## 2022-11-12 ENCOUNTER — Ambulatory Visit: Payer: Medicare Other | Attending: Student | Admitting: Pulmonary Disease

## 2022-11-12 ENCOUNTER — Encounter: Payer: Self-pay | Admitting: Student

## 2022-11-12 VITALS — BP 136/76 | HR 72 | Ht 69.0 in | Wt 224.0 lb

## 2022-11-12 DIAGNOSIS — I441 Atrioventricular block, second degree: Secondary | ICD-10-CM | POA: Diagnosis not present

## 2022-11-12 DIAGNOSIS — I10 Essential (primary) hypertension: Secondary | ICD-10-CM | POA: Diagnosis not present

## 2022-11-12 LAB — CUP PACEART INCLINIC DEVICE CHECK
Battery Remaining Longevity: 112 mo
Battery Voltage: 3.01 V
Brady Statistic AP VP Percent: 1.16 %
Brady Statistic AP VS Percent: 0 %
Brady Statistic AS VP Percent: 98.14 %
Brady Statistic AS VS Percent: 0.7 %
Brady Statistic RA Percent Paced: 1.33 %
Brady Statistic RV Percent Paced: 99.29 %
Date Time Interrogation Session: 20240904130935
Implantable Lead Connection Status: 753985
Implantable Lead Connection Status: 753985
Implantable Lead Implant Date: 20230202
Implantable Lead Implant Date: 20230202
Implantable Lead Location: 753859
Implantable Lead Location: 753860
Implantable Lead Model: 3830
Implantable Lead Model: 5076
Implantable Pulse Generator Implant Date: 20230202
Lead Channel Impedance Value: 323 Ohm
Lead Channel Impedance Value: 380 Ohm
Lead Channel Impedance Value: 418 Ohm
Lead Channel Impedance Value: 437 Ohm
Lead Channel Pacing Threshold Amplitude: 0.375 V
Lead Channel Pacing Threshold Amplitude: 1.25 V
Lead Channel Pacing Threshold Pulse Width: 0.4 ms
Lead Channel Pacing Threshold Pulse Width: 0.4 ms
Lead Channel Sensing Intrinsic Amplitude: 3.625 mV
Lead Channel Sensing Intrinsic Amplitude: 4.125 mV
Lead Channel Sensing Intrinsic Amplitude: 4.25 mV
Lead Channel Sensing Intrinsic Amplitude: 4.375 mV
Lead Channel Setting Pacing Amplitude: 1.5 V
Lead Channel Setting Pacing Amplitude: 2.5 V
Lead Channel Setting Pacing Pulse Width: 0.4 ms
Lead Channel Setting Sensing Sensitivity: 1.2 mV
Zone Setting Status: 755011

## 2022-11-12 MED ORDER — APIXABAN 5 MG PO TABS: 5.0000 mg | ORAL_TABLET | Freq: Two times a day (BID) | ORAL | Status: DC

## 2022-11-12 MED ORDER — APIXABAN 5 MG PO TABS: 5.0000 mg | ORAL_TABLET | Freq: Two times a day (BID) | ORAL | 11 refills | Status: DC

## 2022-11-12 NOTE — Addendum Note (Signed)
Addended by: Valrie Hart on: 11/12/2022 01:08 PM   Modules accepted: Orders

## 2022-11-12 NOTE — Patient Instructions (Signed)
Medication Instructions:  Start eliquis 5 mg twice daily *If you need a refill on your cardiac medications before your next appointment, please call your pharmacy*  Lab Work: BMET, CBC-TODAY If you have labs (blood work) drawn today and your tests are completely normal, you will receive your results only by: MyChart Message (if you have MyChart) OR A paper copy in the mail If you have any lab test that is abnormal or we need to change your treatment, we will call you to review the results.  Follow-Up: At [facility], you and your health needs are our priority.  As part of our continuing mission to provide you with exceptional heart care, we have created designated Provider Care Teams.  These Care Teams include your primary Cardiologist (physician) and Advanced Practice Providers (APPs -  Physician Assistants and Nurse Practitioners) who all work together to provide you with the care you need, when you need it.  Your next appointment:   1 month(s)  Provider:   Casimiro Needle "Otilio Saber, PA-C

## 2022-11-13 LAB — BASIC METABOLIC PANEL
BUN/Creatinine Ratio: 12 (ref 10–24)
BUN: 16 mg/dL (ref 8–27)
CO2: 23 mmol/L (ref 20–29)
Calcium: 9.5 mg/dL (ref 8.6–10.2)
Chloride: 104 mmol/L (ref 96–106)
Creatinine, Ser: 1.3 mg/dL — ABNORMAL HIGH (ref 0.76–1.27)
Glucose: 120 mg/dL — ABNORMAL HIGH (ref 70–99)
Potassium: 4.8 mmol/L (ref 3.5–5.2)
Sodium: 144 mmol/L (ref 134–144)
eGFR: 59 mL/min/{1.73_m2} — ABNORMAL LOW (ref >59)

## 2022-11-13 LAB — CBC
Hematocrit: 45.7 % (ref 37.5–51.0)
Hemoglobin: 14.7 g/dL (ref 13.0–17.7)
MCH: 29.9 pg (ref 26.6–33.0)
MCHC: 32.2 g/dL (ref 31.5–35.7)
MCV: 93 fL (ref 79–97)
Platelets: 200 10*3/uL (ref 150–450)
RBC: 4.92 x10E6/uL (ref 4.14–5.80)
RDW: 15.4 % (ref 11.6–15.4)
WBC: 8.4 10*3/uL (ref 3.4–10.8)

## 2022-11-18 DIAGNOSIS — C184 Malignant neoplasm of transverse colon: Secondary | ICD-10-CM | POA: Diagnosis not present

## 2022-11-20 DIAGNOSIS — C189 Malignant neoplasm of colon, unspecified: Secondary | ICD-10-CM | POA: Diagnosis not present

## 2022-11-20 DIAGNOSIS — C184 Malignant neoplasm of transverse colon: Secondary | ICD-10-CM | POA: Diagnosis not present

## 2022-11-27 DIAGNOSIS — E291 Testicular hypofunction: Secondary | ICD-10-CM | POA: Diagnosis not present

## 2022-12-04 DIAGNOSIS — M25512 Pain in left shoulder: Secondary | ICD-10-CM | POA: Diagnosis not present

## 2022-12-04 DIAGNOSIS — M5136 Other intervertebral disc degeneration, lumbar region: Secondary | ICD-10-CM | POA: Diagnosis not present

## 2022-12-18 DIAGNOSIS — M5416 Radiculopathy, lumbar region: Secondary | ICD-10-CM | POA: Diagnosis not present

## 2022-12-18 DIAGNOSIS — E291 Testicular hypofunction: Secondary | ICD-10-CM | POA: Diagnosis not present

## 2022-12-23 ENCOUNTER — Ambulatory Visit: Payer: Medicare Other | Admitting: Podiatry

## 2022-12-23 DIAGNOSIS — B351 Tinea unguium: Secondary | ICD-10-CM

## 2022-12-23 DIAGNOSIS — L84 Corns and callosities: Secondary | ICD-10-CM

## 2022-12-23 DIAGNOSIS — Z7901 Long term (current) use of anticoagulants: Secondary | ICD-10-CM | POA: Diagnosis not present

## 2022-12-23 DIAGNOSIS — E1149 Type 2 diabetes mellitus with other diabetic neurological complication: Secondary | ICD-10-CM

## 2022-12-23 MED ORDER — TAVABOROLE 5 % EX SOLN
1.0000 [drp] | Freq: Every day | CUTANEOUS | 2 refills | Status: AC
Start: 2022-12-23 — End: ?

## 2022-12-23 NOTE — Patient Instructions (Addendum)
Tavaborole Topical Solution What is this medication? TAVABOROLE (ta va BO role) treats fungal infections of the nails. It belongs to a group of medications called antifungals. It will not treat infections caused by bacteria or viruses. This medicine may be used for other purposes; ask your health care provider or pharmacist if you have questions. COMMON BRAND NAME(S): KERYDIN What should I tell my care team before I take this medication? They need to know if you have any of these conditions: An unusual or allergic reaction to tavaborole, other medications, foods, dyes, or preservatives Pregnant or trying to get pregnant Breast-feeding How should I use this medication? This medication is for external use only. Do not take by mouth. Wash your hands before and after use. If you are treating your hands, only wash your hands before use. Do not get it in your eyes. If you do, rinse your eyes with plenty of cool tap water. Use it as directed on the prescription label. Do not use it more often than directed. Use the medication for the full course as directed by your care team, even if you think you are better. Do not stop using it unless your care team tells you to stop it early. Apply a thin film of the medication to the affected area. Talk to your care team about the use of this medication in children. While it may be prescribed for children as young as 6 years for selected conditions, precautions do apply. Overdosage: If you think you have taken too much of this medicine contact a poison control center or emergency room at once. NOTE: This medicine is only for you. Do not share this medicine with others. What if I miss a dose? If you miss a dose, use it as soon as you can. If it is almost time for your next dose, use only that dose. Do not use double or extra doses. What may interact with this medication? Interactions have not been studied. Do not use any other nail products (i.e., nail polish,  pedicures) during treatment with this medication. This list may not describe all possible interactions. Give your health care provider a list of all the medicines, herbs, non-prescription drugs, or dietary supplements you use. Also tell them if you smoke, drink alcohol, or use illegal drugs. Some items may interact with your medicine. What should I watch for while using this medication? Visit your care team for regular checks on your progress. It may be some time before you see the benefit from this medication. After bathing, make sure your skin is very dry. Fungal infections like moist conditions. Do not walk around barefoot. To help prevent reinfection, wear freshly washed cotton, not synthetic, clothing. Tell your care team if you develop sores or blisters that do not heal properly. If your skin infection returns after you stop using this medication, contact your care team. What side effects may I notice from receiving this medication? Side effects that you should report to your care team as soon as possible: Allergic reactions--skin rash, itching, hives, swelling of the face, lips, tongue, or throat Burning, itching, crusting, or peeling of treated skin Side effects that usually do not require medical attention (report to your care team if they continue or are bothersome): Ingrown nails Mild skin irritation, redness, or dryness This list may not describe all possible side effects. Call your doctor for medical advice about side effects. You may report side effects to FDA at 1-800-FDA-1088. Where should I keep my medication? Keep out  of the reach of children and pets. Store at room temperature between 20 and 25 degrees C (68 and 77 degrees F). Keep this medication in the original container. Protect from moisture. Keep the container tightly closed. Avoid exposure to extreme heat. Get rid of any unused medication 3 months after opening. This medication is flammable. Avoid exposure to heat, fire,  flame, and smoking. To get rid of medications that are no longer needed or have expired: Take the medications to a medication take-back program. Check with your pharmacy or law enforcement to find a location. If you cannot return the medication, check the label or package insert to see if the medication should be thrown out in the garbage or flushed down the toilet. If you are not sure, ask your care team. If it is safe to put it in the trash, empty the medication out of the container. Mix the medication with cat litter, dirt, coffee grounds, or other unwanted substance. Seal the mixture in a bag or container. Put it in the trash. NOTE: This sheet is a summary. It may not cover all possible information. If you have questions about this medicine, talk to your doctor, pharmacist, or health care provider.  2024 Elsevier/Gold Standard (2021-06-13 00:00:00)   Diabetes Mellitus and Foot Care Diabetes, also called diabetes mellitus, may cause problems with your feet and legs because of poor blood flow (circulation). Poor circulation may make your skin: Become thinner and drier. Break more easily. Heal more slowly. Peel and crack. You may also have nerve damage (neuropathy). This can cause decreased feeling in your legs and feet. This means that you may not notice minor injuries to your feet that could lead to more serious problems. Finding and treating problems early is the best way to prevent future foot problems. How to care for your feet Foot hygiene  Wash your feet daily with warm water and mild soap. Do not use hot water. Then, pat your feet and the areas between your toes until they are fully dry. Do not soak your feet. This can dry your skin. Trim your toenails straight across. Do not dig under them or around the cuticle. File the edges of your nails with an emery board or nail file. Apply a moisturizing lotion or petroleum jelly to the skin on your feet and to dry, brittle toenails. Use lotion  that does not contain alcohol and is unscented. Do not apply lotion between your toes. Shoes and socks Wear clean socks or stockings every day. Make sure they are not too tight. Do not wear knee-high stockings. These may decrease blood flow to your legs. Wear shoes that fit well and have enough cushioning. Always look in your shoes before you put them on to be sure there are no objects inside. To break in new shoes, wear them for just a few hours a day. This prevents injuries on your feet. Wounds, scrapes, corns, and calluses  Check your feet daily for blisters, cuts, bruises, sores, and redness. If you cannot see the bottom of your feet, use a mirror or ask someone for help. Do not cut off corns or calluses or try to remove them with medicine. If you find a minor scrape, cut, or break in the skin on your feet, keep it and the skin around it clean and dry. You may clean these areas with mild soap and water. Do not clean the area with peroxide, alcohol, or iodine. If you have a wound, scrape, corn, or callus on  your foot, look at it several times a day to make sure it is healing and not infected. Check for: Redness, swelling, or pain. Fluid or blood. Warmth. Pus or a bad smell. General tips Do not cross your legs. This may decrease blood flow to your feet. Do not use heating pads or hot water bottles on your feet. They may burn your skin. If you have lost feeling in your feet or legs, you may not know this is happening until it is too late. Protect your feet from hot and cold by wearing shoes, such as at the beach or on hot pavement. Schedule a complete foot exam at least once a year or more often if you have foot problems. Report any cuts, sores, or bruises to your health care provider right away. Where to find more information American Diabetes Association: diabetes.org Association of Diabetes Care & Education Specialists: diabeteseducator.org Contact a health care provider if: You have a  condition that increases your risk of infection, and you have any cuts, sores, or bruises on your feet. You have an injury that is not healing. You have redness on your legs or feet. You feel burning or tingling in your legs or feet. You have pain or cramps in your legs and feet. Your legs or feet are numb. Your feet always feel cold. You have pain around any toenails. Get help right away if: You have a wound, scrape, corn, or callus on your foot and: You have signs of infection. You have a fever. You have a red line going up your leg. This information is not intended to replace advice given to you by your health care provider. Make sure you discuss any questions you have with your health care provider. Document Revised: 08/28/2021 Document Reviewed: 08/28/2021 Elsevier Patient Education  2024 [employer].

## 2022-12-23 NOTE — Progress Notes (Signed)
Subjective: Chief Complaint  Patient presents with   Diabetes    DFC A1C - 6.0 BLOOD THINNER    72 year old male presents the office today for concerns.  He states his nails are thick elongated cannot trim himself and he has noticed discoloration particular the right hallux toenail.  No open lesions and he reports.  He tries to keep miracle foot cream last week.  He is on Eliquis.  Objective: AAO x3, NAD DP/PT pulses palpable bilaterally, CRT less than 3 seconds Sensation absent with complex monofilament. Nails are mildly hypertrophic, dystrophic with yellow discoloration however the right hallux toenail is yellow, brown discoloration.  There is no edema, erythema or signs of infection. Callus formation submetatarsal 1 without any underlying ulceration drainage or signs of infection the left foot. No pain with calf compression, swelling, warmth, erythema  Assessment: 72 year old male with chronic onychomycosis mycosis, preulcerative callus on Eliquis   Plan: -All treatment options discussed with the patient including all alternatives, risks, complications.  -Sharply debrided nails x 10 without any complications or bleeding. -Debrided hyperkeratotic lesion x 1 without any comfortably. Jodi Geralds for nail fungus -Continue gabapentin for neuropathy -Moisturizer daily but not interdigitally. -Daily foot inspection. -Patient encouraged to call the office with any questions, concerns, change in symptoms.   Vivi Barrack DPM

## 2022-12-24 ENCOUNTER — Encounter: Payer: Self-pay | Admitting: Student

## 2022-12-24 ENCOUNTER — Ambulatory Visit: Payer: Medicare Other | Attending: Student | Admitting: Student

## 2022-12-24 VITALS — BP 98/56 | HR 89 | Ht 69.0 in | Wt 217.2 lb

## 2022-12-24 DIAGNOSIS — I441 Atrioventricular block, second degree: Secondary | ICD-10-CM

## 2022-12-24 DIAGNOSIS — I10 Essential (primary) hypertension: Secondary | ICD-10-CM

## 2022-12-24 DIAGNOSIS — I480 Paroxysmal atrial fibrillation: Secondary | ICD-10-CM

## 2022-12-24 LAB — CUP PACEART INCLINIC DEVICE CHECK
Battery Remaining Longevity: 102 mo
Battery Voltage: 3.01 V
Brady Statistic AP VP Percent: 1.01 %
Brady Statistic AP VS Percent: 0 %
Brady Statistic AS VP Percent: 97.84 %
Brady Statistic AS VS Percent: 1.15 %
Brady Statistic RA Percent Paced: 1.15 %
Brady Statistic RV Percent Paced: 98.85 %
Date Time Interrogation Session: 20241016110516
Implantable Lead Connection Status: 753985
Implantable Lead Connection Status: 753985
Implantable Lead Implant Date: 20230202
Implantable Lead Implant Date: 20230202
Implantable Lead Location: 753859
Implantable Lead Location: 753860
Implantable Lead Model: 3830
Implantable Lead Model: 5076
Implantable Pulse Generator Implant Date: 20230202
Lead Channel Impedance Value: 342 Ohm
Lead Channel Impedance Value: 380 Ohm
Lead Channel Impedance Value: 418 Ohm
Lead Channel Impedance Value: 456 Ohm
Lead Channel Pacing Threshold Amplitude: 0.375 V
Lead Channel Pacing Threshold Amplitude: 1.375 V
Lead Channel Pacing Threshold Pulse Width: 0.4 ms
Lead Channel Pacing Threshold Pulse Width: 0.4 ms
Lead Channel Sensing Intrinsic Amplitude: 4.125 mV
Lead Channel Sensing Intrinsic Amplitude: 4.125 mV
Lead Channel Sensing Intrinsic Amplitude: 7.25 mV
Lead Channel Sensing Intrinsic Amplitude: 7.25 mV
Lead Channel Setting Pacing Amplitude: 1.5 V
Lead Channel Setting Pacing Amplitude: 2.75 V
Lead Channel Setting Pacing Pulse Width: 0.4 ms
Lead Channel Setting Sensing Sensitivity: 1.2 mV
Zone Setting Status: 755011

## 2022-12-24 NOTE — Patient Instructions (Signed)
Medication Instructions:  Your physician recommends that you continue on your current medications as directed. Please refer to the Current Medication list given to you today.  *If you need a refill on your cardiac medications before your next appointment, please call your pharmacy*  Lab Work: BMET, CBC-TODAY If you have labs (blood work) drawn today and your tests are completely normal, you will receive your results only by: MyChart Message (if you have MyChart) OR A paper copy in the mail If you have any lab test that is abnormal or we need to change your treatment, we will call you to review the results.  Follow-Up: At [facility], you and your health needs are our priority.  As part of our continuing mission to provide you with exceptional heart care, we have created designated Provider Care Teams.  These Care Teams include your primary Cardiologist (physician) and Advanced Practice Providers (APPs -  Physician Assistants and Nurse Practitioners) who all work together to provide you with the care you need, when you need it.  Your next appointment:   6 month(s)  Provider:   Loman Brooklyn, MD

## 2022-12-24 NOTE — Progress Notes (Signed)
Electrophysiology Office Note:   Date:  12/24/2022  ID:  Joshua Duty., DOB 05-13-1950, MRN [id number]  Primary Cardiologist: None Electrophysiologist: Will Jorja Loa, MD       History of Present Illness:   Joshua Jarmon. is a 72 y.o. male with h/o HTN, Mobitz I AVB, HLD, CKD, NAFLD, HIV, DM seen today for routine electrophysiology followup.   Seen in EP Clinic 07/2021 and was doing well.  Last remote device check 10/23/22 showed normal device function, battery / leads stable per Dr. Elberta Fortis.   Seen 11/12/2022 and started on Eliquis for AF episode > 4hrs and CHA2DS2/VASc of at least 3.   Overall he has felt well since that visit with no new complaints. Has not noted any bleeding. He denies symptoms of palpitations, chest pain, shortness of breath, orthopnea, PND, lower extremity edema, claudication, dizziness, presyncope, syncope, bleeding, or neurologic sequela. The patient is tolerating medications without difficulties.    Review of systems complete and found to be negative unless listed in HPI.    EP Information / Studies Reviewed:    EKG is not ordered today. EKG from 11/12/2022 reviewed which showed AS-VP at 72 bpm      PPM Interrogation-  Brief interrogation today; No further AF noted. 1 brief episode of NSVT on 10/13, asymptomatic.   Device History: Medtronic Dual Chamber PPM implanted 04/11/2021 for Second Degree AV block  Studies ECHO 2017 > EF 60-65%   Physical Exam:   VS:  BP (!) 98/56   Pulse 89   Ht 5\' 9"  (1.753 m)   Wt 217 lb 3.2 oz (98.5 kg)   SpO2 98%   BMI 32.07 kg/m    Wt Readings from Last 3 Encounters:  12/24/22 217 lb 3.2 oz (98.5 kg)  11/12/22 224 lb (101.6 kg)  09/24/22 219 lb 1.6 oz (99.4 kg)    GEN: Well nourished, well developed in no acute distress NECK: No JVD; No carotid bruits CARDIAC: Regular rate and rhythm, no murmurs, rubs, gallops RESPIRATORY:  Clear to auscultation without rales, wheezing or rhonchi  ABDOMEN: Soft,  non-tender, non-distended EXTREMITIES:  No edema; No deformity    ASSESSMENT AND PLAN:    Second Degree AV block Type I & II s/p Medtronic PPM  Normal PPM function at last visit.  Very brief check today, no further AF.    Hx of PAF CHA2DS2-VASc 3 / 3.2 % annual risk of CVA Continue Eliquis 5 mg BID BMET and CBC today.   HTN Stable on current regimen   HLD Continue statin    Disposition:   Follow up with Dr. Elberta Fortis in 6 months  Signed, Baldwin Crown" [place], [place]  12/24/2022 11:04 AM

## 2022-12-24 NOTE — Addendum Note (Signed)
Addended by: Valrie Hart on: 12/24/2022 11:10 AM   Modules accepted: Orders

## 2022-12-25 LAB — BASIC METABOLIC PANEL
BUN/Creatinine Ratio: 12 (ref 10–24)
BUN: 20 mg/dL (ref 8–27)
CO2: 20 mmol/L (ref 20–29)
Calcium: 9.7 mg/dL (ref 8.6–10.2)
Chloride: 104 mmol/L (ref 96–106)
Creatinine, Ser: 1.72 mg/dL — ABNORMAL HIGH (ref 0.76–1.27)
Glucose: 201 mg/dL — ABNORMAL HIGH (ref 70–99)
Potassium: 5 mmol/L (ref 3.5–5.2)
Sodium: 139 mmol/L (ref 134–144)
eGFR: 42 mL/min/{1.73_m2} — ABNORMAL LOW (ref >59)

## 2022-12-25 LAB — CBC
Hematocrit: 48.1 % (ref 37.5–51.0)
Hemoglobin: 16 g/dL (ref 13.0–17.7)
MCH: 31.3 pg (ref 26.6–33.0)
MCHC: 33.3 g/dL (ref 31.5–35.7)
MCV: 94 fL (ref 79–97)
Platelets: 173 10*3/uL (ref 150–450)
RBC: 5.12 x10E6/uL (ref 4.14–5.80)
RDW: 14.8 % (ref 11.6–15.4)
WBC: 9.6 10*3/uL (ref 3.4–10.8)

## 2022-12-26 ENCOUNTER — Telehealth: Payer: Self-pay | Admitting: Student

## 2022-12-26 ENCOUNTER — Other Ambulatory Visit: Payer: Self-pay | Admitting: *Deleted

## 2022-12-26 DIAGNOSIS — Z79899 Other long term (current) drug therapy: Secondary | ICD-10-CM

## 2022-12-26 DIAGNOSIS — I480 Paroxysmal atrial fibrillation: Secondary | ICD-10-CM

## 2022-12-26 NOTE — Telephone Encounter (Signed)
Patient is calling to get his lab results

## 2022-12-26 NOTE — Telephone Encounter (Signed)
Returned call to pt, see lab result.

## 2023-01-13 ENCOUNTER — Ambulatory Visit (INDEPENDENT_AMBULATORY_CARE_PROVIDER_SITE_OTHER): Payer: Medicare Other

## 2023-01-13 DIAGNOSIS — I441 Atrioventricular block, second degree: Secondary | ICD-10-CM

## 2023-01-13 LAB — CUP PACEART REMOTE DEVICE CHECK
Battery Remaining Longevity: 96 mo
Battery Voltage: 3 V
Brady Statistic AP VP Percent: 1.09 %
Brady Statistic AP VS Percent: 0 %
Brady Statistic AS VP Percent: 97.16 %
Brady Statistic AS VS Percent: 1.75 %
Brady Statistic RA Percent Paced: 1.46 %
Brady Statistic RV Percent Paced: 97.74 %
Date Time Interrogation Session: 20241105005941
Implantable Lead Connection Status: 753985
Implantable Lead Connection Status: 753985
Implantable Lead Implant Date: 20230202
Implantable Lead Implant Date: 20230202
Implantable Lead Location: 753859
Implantable Lead Location: 753860
Implantable Lead Model: 3830
Implantable Lead Model: 5076
Implantable Pulse Generator Implant Date: 20230202
Lead Channel Impedance Value: 361 Ohm
Lead Channel Impedance Value: 399 Ohm
Lead Channel Impedance Value: 437 Ohm
Lead Channel Impedance Value: 456 Ohm
Lead Channel Pacing Threshold Amplitude: 0.375 V
Lead Channel Pacing Threshold Amplitude: 1.375 V
Lead Channel Pacing Threshold Pulse Width: 0.4 ms
Lead Channel Pacing Threshold Pulse Width: 0.4 ms
Lead Channel Sensing Intrinsic Amplitude: 3.25 mV
Lead Channel Sensing Intrinsic Amplitude: 3.25 mV
Lead Channel Sensing Intrinsic Amplitude: 4.125 mV
Lead Channel Sensing Intrinsic Amplitude: 4.125 mV
Lead Channel Setting Pacing Amplitude: 1.5 V
Lead Channel Setting Pacing Amplitude: 3 V
Lead Channel Setting Pacing Pulse Width: 0.4 ms
Lead Channel Setting Sensing Sensitivity: 1.2 mV
Zone Setting Status: 755011

## 2023-01-21 DIAGNOSIS — E291 Testicular hypofunction: Secondary | ICD-10-CM | POA: Diagnosis not present

## 2023-01-26 DIAGNOSIS — E1129 Type 2 diabetes mellitus with other diabetic kidney complication: Secondary | ICD-10-CM | POA: Diagnosis not present

## 2023-01-26 DIAGNOSIS — N182 Chronic kidney disease, stage 2 (mild): Secondary | ICD-10-CM | POA: Diagnosis not present

## 2023-01-26 DIAGNOSIS — N179 Acute kidney failure, unspecified: Secondary | ICD-10-CM | POA: Diagnosis not present

## 2023-01-26 DIAGNOSIS — E1122 Type 2 diabetes mellitus with diabetic chronic kidney disease: Secondary | ICD-10-CM | POA: Diagnosis not present

## 2023-01-26 DIAGNOSIS — I129 Hypertensive chronic kidney disease with stage 1 through stage 4 chronic kidney disease, or unspecified chronic kidney disease: Secondary | ICD-10-CM | POA: Diagnosis not present

## 2023-01-27 LAB — LAB REPORT - SCANNED
A1c: 6.9
Creatinine, POC: 110.6 mg/dL

## 2023-02-04 DIAGNOSIS — E291 Testicular hypofunction: Secondary | ICD-10-CM | POA: Diagnosis not present

## 2023-02-10 NOTE — Progress Notes (Signed)
Remote pacemaker transmission.   

## 2023-02-11 DIAGNOSIS — Z79899 Other long term (current) drug therapy: Secondary | ICD-10-CM | POA: Diagnosis not present

## 2023-02-11 DIAGNOSIS — E1165 Type 2 diabetes mellitus with hyperglycemia: Secondary | ICD-10-CM | POA: Diagnosis not present

## 2023-02-11 DIAGNOSIS — Z1211 Encounter for screening for malignant neoplasm of colon: Secondary | ICD-10-CM | POA: Diagnosis not present

## 2023-02-11 DIAGNOSIS — Z7901 Long term (current) use of anticoagulants: Secondary | ICD-10-CM | POA: Diagnosis not present

## 2023-02-11 DIAGNOSIS — E782 Mixed hyperlipidemia: Secondary | ICD-10-CM | POA: Diagnosis not present

## 2023-02-11 DIAGNOSIS — Z794 Long term (current) use of insulin: Secondary | ICD-10-CM | POA: Diagnosis not present

## 2023-02-11 DIAGNOSIS — E1122 Type 2 diabetes mellitus with diabetic chronic kidney disease: Secondary | ICD-10-CM | POA: Diagnosis not present

## 2023-02-11 DIAGNOSIS — D123 Benign neoplasm of transverse colon: Secondary | ICD-10-CM | POA: Diagnosis not present

## 2023-02-11 DIAGNOSIS — C182 Malignant neoplasm of ascending colon: Secondary | ICD-10-CM | POA: Diagnosis not present

## 2023-02-11 DIAGNOSIS — Z800 Family history of malignant neoplasm of digestive organs: Secondary | ICD-10-CM | POA: Diagnosis not present

## 2023-02-11 DIAGNOSIS — T184XXA Foreign body in colon, initial encounter: Secondary | ICD-10-CM | POA: Diagnosis not present

## 2023-02-11 DIAGNOSIS — D129 Benign neoplasm of anus and anal canal: Secondary | ICD-10-CM | POA: Diagnosis not present

## 2023-02-11 DIAGNOSIS — Z860101 Personal history of adenomatous and serrated colon polyps: Secondary | ICD-10-CM | POA: Diagnosis not present

## 2023-02-11 DIAGNOSIS — I4891 Unspecified atrial fibrillation: Secondary | ICD-10-CM | POA: Diagnosis not present

## 2023-02-11 DIAGNOSIS — Z980 Intestinal bypass and anastomosis status: Secondary | ICD-10-CM | POA: Diagnosis not present

## 2023-02-11 DIAGNOSIS — Z950 Presence of cardiac pacemaker: Secondary | ICD-10-CM | POA: Diagnosis not present

## 2023-02-11 DIAGNOSIS — N189 Chronic kidney disease, unspecified: Secondary | ICD-10-CM | POA: Diagnosis not present

## 2023-02-11 DIAGNOSIS — Z9049 Acquired absence of other specified parts of digestive tract: Secondary | ICD-10-CM | POA: Diagnosis not present

## 2023-02-11 DIAGNOSIS — I129 Hypertensive chronic kidney disease with stage 1 through stage 4 chronic kidney disease, or unspecified chronic kidney disease: Secondary | ICD-10-CM | POA: Diagnosis not present

## 2023-02-11 DIAGNOSIS — Z85038 Personal history of other malignant neoplasm of large intestine: Secondary | ICD-10-CM | POA: Diagnosis not present

## 2023-02-13 DIAGNOSIS — C184 Malignant neoplasm of transverse colon: Secondary | ICD-10-CM | POA: Diagnosis not present

## 2023-02-25 DIAGNOSIS — Z006 Encounter for examination for normal comparison and control in clinical research program: Secondary | ICD-10-CM | POA: Diagnosis not present

## 2023-02-25 DIAGNOSIS — C184 Malignant neoplasm of transverse colon: Secondary | ICD-10-CM | POA: Diagnosis not present

## 2023-02-27 DIAGNOSIS — E291 Testicular hypofunction: Secondary | ICD-10-CM | POA: Diagnosis not present

## 2023-03-06 DIAGNOSIS — H5212 Myopia, left eye: Secondary | ICD-10-CM | POA: Diagnosis not present

## 2023-03-06 DIAGNOSIS — H524 Presbyopia: Secondary | ICD-10-CM | POA: Diagnosis not present

## 2023-03-13 DIAGNOSIS — E291 Testicular hypofunction: Secondary | ICD-10-CM | POA: Diagnosis not present

## 2023-03-27 DIAGNOSIS — E291 Testicular hypofunction: Secondary | ICD-10-CM | POA: Diagnosis not present

## 2023-04-02 DIAGNOSIS — D225 Melanocytic nevi of trunk: Secondary | ICD-10-CM | POA: Diagnosis not present

## 2023-04-02 DIAGNOSIS — D485 Neoplasm of uncertain behavior of skin: Secondary | ICD-10-CM | POA: Diagnosis not present

## 2023-04-02 DIAGNOSIS — Z85828 Personal history of other malignant neoplasm of skin: Secondary | ICD-10-CM | POA: Diagnosis not present

## 2023-04-02 DIAGNOSIS — A609 Anogenital herpesviral infection, unspecified: Secondary | ICD-10-CM | POA: Diagnosis not present

## 2023-04-02 DIAGNOSIS — L281 Prurigo nodularis: Secondary | ICD-10-CM | POA: Diagnosis not present

## 2023-04-02 DIAGNOSIS — L570 Actinic keratosis: Secondary | ICD-10-CM | POA: Diagnosis not present

## 2023-04-02 DIAGNOSIS — D2372 Other benign neoplasm of skin of left lower limb, including hip: Secondary | ICD-10-CM | POA: Diagnosis not present

## 2023-04-13 DIAGNOSIS — K080 Exfoliation of teeth due to systemic causes: Secondary | ICD-10-CM | POA: Diagnosis not present

## 2023-04-14 ENCOUNTER — Ambulatory Visit (INDEPENDENT_AMBULATORY_CARE_PROVIDER_SITE_OTHER): Payer: BC Managed Care – PPO

## 2023-04-14 ENCOUNTER — Other Ambulatory Visit: Payer: Self-pay | Admitting: Infectious Diseases

## 2023-04-14 DIAGNOSIS — I441 Atrioventricular block, second degree: Secondary | ICD-10-CM

## 2023-04-14 DIAGNOSIS — B20 Human immunodeficiency virus [HIV] disease: Secondary | ICD-10-CM

## 2023-04-14 DIAGNOSIS — I480 Paroxysmal atrial fibrillation: Secondary | ICD-10-CM

## 2023-04-15 LAB — CUP PACEART REMOTE DEVICE CHECK
Battery Remaining Longevity: 107 mo
Battery Voltage: 3 V
Brady Statistic AP VP Percent: 0.68 %
Brady Statistic AP VS Percent: 0 %
Brady Statistic AS VP Percent: 98.18 %
Brady Statistic AS VS Percent: 1.14 %
Brady Statistic RA Percent Paced: 0.78 %
Brady Statistic RV Percent Paced: 98.77 %
Date Time Interrogation Session: 20250203185328
Implantable Lead Connection Status: 753985
Implantable Lead Connection Status: 753985
Implantable Lead Implant Date: 20230202
Implantable Lead Implant Date: 20230202
Implantable Lead Location: 753859
Implantable Lead Location: 753860
Implantable Lead Model: 3830
Implantable Lead Model: 5076
Implantable Pulse Generator Implant Date: 20230202
Lead Channel Impedance Value: 323 Ohm
Lead Channel Impedance Value: 361 Ohm
Lead Channel Impedance Value: 418 Ohm
Lead Channel Impedance Value: 437 Ohm
Lead Channel Pacing Threshold Amplitude: 0.375 V
Lead Channel Pacing Threshold Amplitude: 1.25 V
Lead Channel Pacing Threshold Pulse Width: 0.4 ms
Lead Channel Pacing Threshold Pulse Width: 0.4 ms
Lead Channel Sensing Intrinsic Amplitude: 4.25 mV
Lead Channel Sensing Intrinsic Amplitude: 4.25 mV
Lead Channel Sensing Intrinsic Amplitude: 5.625 mV
Lead Channel Sensing Intrinsic Amplitude: 5.625 mV
Lead Channel Setting Pacing Amplitude: 1.5 V
Lead Channel Setting Pacing Amplitude: 2.5 V
Lead Channel Setting Pacing Pulse Width: 0.4 ms
Lead Channel Setting Sensing Sensitivity: 1.2 mV
Zone Setting Status: 755011

## 2023-04-17 DIAGNOSIS — E291 Testicular hypofunction: Secondary | ICD-10-CM | POA: Diagnosis not present

## 2023-04-22 DIAGNOSIS — R2681 Unsteadiness on feet: Secondary | ICD-10-CM | POA: Diagnosis not present

## 2023-04-22 DIAGNOSIS — H81399 Other peripheral vertigo, unspecified ear: Secondary | ICD-10-CM | POA: Diagnosis not present

## 2023-04-22 DIAGNOSIS — M5459 Other low back pain: Secondary | ICD-10-CM | POA: Diagnosis not present

## 2023-04-22 DIAGNOSIS — M6281 Muscle weakness (generalized): Secondary | ICD-10-CM | POA: Diagnosis not present

## 2023-04-23 DIAGNOSIS — E291 Testicular hypofunction: Secondary | ICD-10-CM | POA: Diagnosis not present

## 2023-04-23 DIAGNOSIS — Z125 Encounter for screening for malignant neoplasm of prostate: Secondary | ICD-10-CM | POA: Diagnosis not present

## 2023-04-23 DIAGNOSIS — E1142 Type 2 diabetes mellitus with diabetic polyneuropathy: Secondary | ICD-10-CM | POA: Diagnosis not present

## 2023-05-07 DIAGNOSIS — E291 Testicular hypofunction: Secondary | ICD-10-CM | POA: Diagnosis not present

## 2023-05-18 DIAGNOSIS — R161 Splenomegaly, not elsewhere classified: Secondary | ICD-10-CM | POA: Diagnosis not present

## 2023-05-18 DIAGNOSIS — R0989 Other specified symptoms and signs involving the circulatory and respiratory systems: Secondary | ICD-10-CM | POA: Diagnosis not present

## 2023-05-18 DIAGNOSIS — C184 Malignant neoplasm of transverse colon: Secondary | ICD-10-CM | POA: Diagnosis not present

## 2023-05-21 ENCOUNTER — Telehealth: Payer: Self-pay | Admitting: *Deleted

## 2023-05-21 DIAGNOSIS — C184 Malignant neoplasm of transverse colon: Secondary | ICD-10-CM | POA: Diagnosis not present

## 2023-05-21 NOTE — Progress Notes (Signed)
 Remote pacemaker transmission.

## 2023-05-21 NOTE — Telephone Encounter (Signed)
 I called and spoke with patient about scheduling his appt with Dr. Ninetta Lights. His appt is scheduled for 06/30/2023.

## 2023-05-21 NOTE — Telephone Encounter (Signed)
 Copied from CRM [id number]. Topic: Appointments - Appointment Scheduling >> May 21, 2023 11:05 AM Yvone Neu wrote: Patient already has lab appointment in place on 04/03 and is needing an appointment made for his 6 month follow up appointment, patients callback number is [phone].

## 2023-05-21 NOTE — Addendum Note (Signed)
 Addended by: Geralyn Flash D on: 05/21/2023 01:41 PM   Modules accepted: Orders

## 2023-05-21 NOTE — Telephone Encounter (Signed)
 Pt needs an appt w/Dr Ninetta Lights. [place]

## 2023-05-25 DIAGNOSIS — N182 Chronic kidney disease, stage 2 (mild): Secondary | ICD-10-CM | POA: Diagnosis not present

## 2023-05-25 DIAGNOSIS — N189 Chronic kidney disease, unspecified: Secondary | ICD-10-CM | POA: Diagnosis not present

## 2023-05-25 DIAGNOSIS — E1122 Type 2 diabetes mellitus with diabetic chronic kidney disease: Secondary | ICD-10-CM | POA: Diagnosis not present

## 2023-05-25 DIAGNOSIS — C184 Malignant neoplasm of transverse colon: Secondary | ICD-10-CM | POA: Diagnosis not present

## 2023-05-25 DIAGNOSIS — I129 Hypertensive chronic kidney disease with stage 1 through stage 4 chronic kidney disease, or unspecified chronic kidney disease: Secondary | ICD-10-CM | POA: Diagnosis not present

## 2023-05-25 DIAGNOSIS — N179 Acute kidney failure, unspecified: Secondary | ICD-10-CM | POA: Diagnosis not present

## 2023-05-29 DIAGNOSIS — E538 Deficiency of other specified B group vitamins: Secondary | ICD-10-CM | POA: Diagnosis not present

## 2023-05-29 DIAGNOSIS — E291 Testicular hypofunction: Secondary | ICD-10-CM | POA: Diagnosis not present

## 2023-05-29 DIAGNOSIS — Z0000 Encounter for general adult medical examination without abnormal findings: Secondary | ICD-10-CM | POA: Diagnosis not present

## 2023-05-29 DIAGNOSIS — Z0184 Encounter for antibody response examination: Secondary | ICD-10-CM | POA: Diagnosis not present

## 2023-06-11 ENCOUNTER — Other Ambulatory Visit: Payer: Medicare Other

## 2023-06-11 ENCOUNTER — Other Ambulatory Visit (HOSPITAL_COMMUNITY)
Admission: RE | Admit: 2023-06-11 | Discharge: 2023-06-11 | Disposition: A | Discharge status: Home / Self Care | Source: Ambulatory Visit | Attending: Internal Medicine | Admitting: Internal Medicine

## 2023-06-11 DIAGNOSIS — B20 Human immunodeficiency virus [HIV] disease: Secondary | ICD-10-CM

## 2023-06-11 DIAGNOSIS — Z113 Encounter for screening for infections with a predominantly sexual mode of transmission: Secondary | ICD-10-CM | POA: Diagnosis not present

## 2023-06-11 DIAGNOSIS — E291 Testicular hypofunction: Secondary | ICD-10-CM | POA: Diagnosis not present

## 2023-06-11 DIAGNOSIS — E538 Deficiency of other specified B group vitamins: Secondary | ICD-10-CM | POA: Diagnosis not present

## 2023-06-11 DIAGNOSIS — Z79899 Other long term (current) drug therapy: Secondary | ICD-10-CM

## 2023-06-12 LAB — T-HELPER CELL (CD4) - (RCID CLINIC ONLY)
CD4 % Helper T Cell: 15 % — ABNORMAL LOW (ref 33–65)
CD4 T Cell Abs: 396 /uL — ABNORMAL LOW (ref 400–1790)

## 2023-06-12 LAB — URINE CYTOLOGY ANCILLARY ONLY
Chlamydia: NEGATIVE
Comment: NEGATIVE
Comment: NORMAL
Neisseria Gonorrhea: NEGATIVE

## 2023-06-13 LAB — LIPID PANEL
Chol/HDL Ratio: 2.6 ratio (ref 0.0–5.0)
Cholesterol, Total: 79 mg/dL — ABNORMAL LOW (ref 100–199)
HDL: 30 mg/dL — ABNORMAL LOW (ref >39)
LDL Chol Calc (NIH): 11 mg/dL (ref 0–99)
Triglycerides: 261 mg/dL — ABNORMAL HIGH (ref 0–149)
VLDL Cholesterol Cal: 38 mg/dL (ref 5–40)

## 2023-06-13 LAB — COMPREHENSIVE METABOLIC PANEL WITH GFR
ALT: 17 IU/L (ref 0–44)
AST: 25 IU/L (ref 0–40)
Albumin: 4.5 g/dL (ref 3.8–4.8)
Alkaline Phosphatase: 99 IU/L (ref 44–121)
BUN/Creatinine Ratio: 14 (ref 10–24)
BUN: 24 mg/dL (ref 8–27)
Bilirubin Total: 2.6 mg/dL — ABNORMAL HIGH (ref 0.0–1.2)
CO2: 22 mmol/L (ref 20–29)
Calcium: 8.8 mg/dL (ref 8.6–10.2)
Chloride: 101 mmol/L (ref 96–106)
Creatinine, Ser: 1.76 mg/dL — ABNORMAL HIGH (ref 0.76–1.27)
Globulin, Total: 2.6 g/dL (ref 1.5–4.5)
Glucose: 86 mg/dL (ref 70–99)
Potassium: 4.3 mmol/L (ref 3.5–5.2)
Sodium: 140 mmol/L (ref 134–144)
Total Protein: 7.1 g/dL (ref 6.0–8.5)
eGFR: 41 mL/min/{1.73_m2} — ABNORMAL LOW (ref >59)

## 2023-06-13 LAB — CBC
Hematocrit: 47.1 % (ref 37.5–51.0)
Hemoglobin: 15.7 g/dL (ref 13.0–17.7)
MCH: 31 pg (ref 26.6–33.0)
MCHC: 33.3 g/dL (ref 31.5–35.7)
MCV: 93 fL (ref 79–97)
Platelets: 202 10*3/uL (ref 150–450)
RBC: 5.06 x10E6/uL (ref 4.14–5.80)
RDW: 13.8 % (ref 11.6–15.4)
WBC: 8.2 10*3/uL (ref 3.4–10.8)

## 2023-06-13 LAB — HIV-1 RNA QUANT-NO REFLEX-BLD
HIV-1 RNA Viral Load Log: 1.602 {Log_copies}/mL
HIV-1 RNA Viral Load: 40 {copies}/mL

## 2023-06-13 LAB — RPR: RPR Ser Ql: NONREACTIVE

## 2023-06-15 NOTE — Progress Notes (Unsigned)
 [facility] [address] [place] [address] Phone: [phone] Subjective:   [name] am a scribe for Dr. Katrinka Blazing.   I'm seeing this patient by the request  of:  Irena Reichmann, DO  CC: low back pain   [id number]  Breken [name]. is a 73 y.o. male coming in with complaint of low back pain. Patient states he dropped the mail with one hand and and something in his other hand and the bending over my have inflamed it. Started at the middle of the back then to the hip and not to the front of the thigh. Raising thigh in seated position causes pain in the thigh. This is recent for about 5 days now.   Duration?about a week ago Friday Did you have an Injury to cause this pain?no Taking Medication for pain? tylenol Numbness or Tingling? No more than normal Does the pain Radiate? no Altered gait or use? yes ROM/ impairment of movement?yes   Patient is being seen by oncology for stage II colon adenocarcinoma.  Patient is being monitored with surveillance and is to get a CT scan every 6 months for 2 to 3 years.   Past medical history significant for HIV, had CT scans from 2017 showing pyelonephritis, cirrhosis as well as family megaly.  There are some outside records showing patient was being seen by another provider for the radiculopathy Past Medical History:  Diagnosis Date   Complication of anesthesia    ANESTHESIA AWARENESS  DURING ADRENALECTOMY AND COLONOSCOPY   Diabetes mellitus    on oral meds-no insulin   DIABETES MELLITUS, TYPE II, UNCONTROLLED 06/22/2006   GILBERT'S SYNDROME 02/25/2007   GOUT 06/22/2006   HEMOCHROMATOSIS, HX OF 12/15/2005   phlebotomy every 6 to 8 weeks--at [facility]   HIV disease (HCC) 1990   [facility]   HNP (herniated nucleus pulposus)    LUMBAR PAIN WITH PAIN DOWN LEFT LEG TO TOES-SOME NUMBNESS AND TINGLING IN BIG TOE   Hypertension    Past Surgical  History:  Procedure Laterality Date   ADRENALECTOMY     RIGHT ADRENALECTOMY    CHOLECYSTECTOMY     LUMBAR LAMINECTOMY/DECOMPRESSION MICRODISCECTOMY  04/23/2011   Procedure: LUMBAR LAMINECTOMY/DECOMPRESSION MICRODISCECTOMY;  Surgeon: Javier Docker, MD;  Location: [facility];  Service: Orthopedics;  Laterality: N/A;  Decompression L5 S1 (X-Ray)   ORIF LEFT SHOULDER     STILL HAS HARDWARE IN THE SHOULDER   PACEMAKER IMPLANT N/A 04/11/2021   Procedure: PACEMAKER IMPLANT;  Surgeon: Regan Lemming, MD;  Location: [facility];  Service: Cardiovascular;  Laterality: N/A;   Social History   Socioeconomic History   Marital status: Single    Spouse name: Not on file   Number of children: 0   Years of education: Not on file   Highest education level: Bachelor's degree (e.g., BA, AB, BS)  Occupational History   Not on file  Tobacco Use   Smoking status: Never   Smokeless tobacco: Never  Vaping Use   Vaping status: Never Used  Substance and Sexual Activity   Alcohol use: No    Alcohol/week: 0.0 standard drinks of alcohol   Drug use: No   Sexual activity: Never    Partners: Male    Comment: pt. declined condoms  Other Topics Concern   Not on file  Social History Narrative   Not on file   Social Drivers of Health  Financial Resource Strain: Low Risk  (06/12/2021)   Overall Financial Resource Strain (CARDIA)    Difficulty of Paying Living Expenses: Not hard at all  Food Insecurity: No Food Insecurity (06/12/2021)   Hunger Vital Sign    Worried About Running Out of Food in the Last Year: Never true    Ran Out of Food in the Last Year: Never true  Transportation Needs: No Transportation Needs (06/12/2021)   PRAPARE - Administrator, Civil Service (Medical): No    Lack of Transportation (Non-Medical): No  Physical Activity: Insufficiently Active (06/12/2021)   Exercise Vital Sign    Days of Exercise per Week: 1 day    Minutes of Exercise per Session: 10 min  Stress:  Stress Concern Present (06/12/2021)   [employer] of Occupational Health - Occupational Stress Questionnaire    Feeling of Stress : Rather much  Social Connections: Unknown (07/22/2021)   Received from [employer], [facility]   Social Network    Social Network: Not on file   Allergies  Allergen Reactions   Hydromorphone Itching and Rash   Scopolamine     GIVEN WITH A SURGERY YRS AGO--CAUSED HALLUNCINATIONS Other reaction(s): Confusion/Altered Mental Status Given during Surgery many years ago - Caused Hallucinations GIVEN WITH A SURGERY YRS AGO--CAUSED HALLUNCINATIONS GIVEN WITH A SURGERY YRS AGO--CAUSED HALLUNCINATIONS   Beta Adrenergic Blockers Other (See Comments)    [place]bach--slowed heart rhythm  Other reaction(s): patient has [place]   Escitalopram Oxalate Rash and Other (See Comments)    lightheaded,  visual  disturbances   Metformin Hcl Diarrhea and Nausea Only    Patient is tolerating    Codeine Itching   Morphine Sulfate Rash   Family History  Problem Relation Age of Onset   Hypertension Mother    Hyperlipidemia Mother    Atrial fibrillation Mother    Hyperlipidemia Father    Hypertension Father    Atrial fibrillation Father     Current Outpatient Medications (Endocrine & Metabolic):    Dapagliflozin-metFORMIN HCl ER (XIGDUO XR) 12-998 MG TB24, Take 1 tablet by mouth daily.   testosterone cypionate (DEPOTESTOSTERONE CYPIONATE) 200 MG/ML injection, Inject 200 mg into the muscle every 14 (fourteen) days.   SOLIQUA 100-33 UNT-MCG/ML SOPN, Inject 60 Units into the skin in the morning. Injects 60 units daily  Current Outpatient Medications (Cardiovascular):    amLODipine (NORVASC) 5 MG tablet, Take 1 tablet (5 mg total) by mouth daily.   atorvastatin (LIPITOR) 10 MG tablet, Take 10 mg by mouth at bedtime.   carvedilol (COREG) 25 MG tablet, TAKE 1 TABLET(25 MG) BY MOUTH TWICE DAILY WITH A MEAL   cholestyramine (QUESTRAN) 4 GM/DOSE powder, Take 4 g by  mouth 3 (three) times daily with meals.   furosemide (LASIX) 20 MG tablet, Take 20 mg by mouth in the morning.   hydrALAZINE (APRESOLINE) 50 MG tablet, Take 75 mg by mouth 3 (three) times daily.   telmisartan (MICARDIS) 80 MG tablet, Take 80 mg by mouth daily before breakfast.  Current Outpatient Medications (Respiratory):    dextromethorphan-guaiFENesin (MUCINEX DM) 30-600 MG 12hr tablet, Take 1 tablet by mouth 2 (two) times daily as needed (cough/congestion.).   fexofenadine (ALLEGRA) 180 MG tablet, Take 180 mg by mouth in the morning.  Current Outpatient Medications (Analgesics):    acetaminophen (TYLENOL) 500 MG tablet, Take 1,000 mg by mouth every 6 (six) hours as needed (for pain.).   allopurinol (ZYLOPRIM) 300 MG tablet, Take 300 mg by mouth in the morning.  colchicine 0.6 MG tablet, Take 0.6 mg by mouth as needed (gout).   Current Outpatient Medications (Hematological):    apixaban (ELIQUIS) 5 MG TABS tablet, Take 1 tablet (5 mg total) by mouth 2 (two) times daily.   Cyanocobalamin (VITAMIN B12) 1000 MCG TBCR, Take 1 capsule by mouth daily.  Current Outpatient Medications (Other):    BD PEN NEEDLE NANO 2ND GEN 32G X 4 MM MISC, Inject into the skin daily.   gabapentin (NEURONTIN) 300 MG capsule, TAKE ONE CAPSULE BY MOUTH TWICE DAILY (Patient taking differently: Take 300-600 mg by mouth See admin instructions. Take 1 capsule (300 mg) by mouth in the morning & take 2 capsules (600 mg) by mouth at night.)   Gabapentin Enacarbil (HORIZANT) 600 MG TBCR, Take 1 tablet (600 mg total) by mouth at bedtime.   glucose blood (ONETOUCH VERIO) test strip, use to test three times daily as needed   ODEFSEY 200-25-25 MG TABS tablet, TAKE ONE TABLET DAILY WITH BREAKFAST   Omega-3 Fatty Acids (FISH OIL PO), Take 3 tablets by mouth daily. 1400 mg each   Tavaborole (KERYDIN) 5 % SOLN, Apply 1 drop topically daily. Apply 1 drop to the toenail daily.   vitamin E 200 UNIT capsule, Take 180 Units by mouth  daily.   Reviewed prior external information including notes and imaging from  primary care provider As well as notes that were available from care everywhere and other healthcare systems.  Past medical history, social, surgical and family history all reviewed in electronic medical record.  No pertanent information unless stated regarding to the chief complaint.   Review of Systems:  No headache, visual changes, nausea, vomiting, diarrhea, constipation, dizziness, abdominal pain, skin rash, fevers, chills, night sweats, weight loss, swollen lymph nodes, body aches, joint swelling, chest pain, shortness of breath, mood changes. POSITIVE muscle aches  Objective  Blood pressure 108/60, pulse 86, height 5\' 9"  (1.753 m), weight 224 lb 9.6 oz (101.9 kg), SpO2 95%.   General: No apparent distress alert and oriented x3 mood and affect normal, dressed appropriately.  HEENT: Pupils equal, extraocular movements intact  Respiratory: Patient's speak in full sentences and does not appear short of breath  Cardiovascular: No lower extremity edema, non tender, no erythema  Low back exam shows loss lordosis noted.  Significant weakness with dorsi flexion on the left side compared to the left.  Patient's Achilles is 1+ left.  2+ on the right.  Patient does have some weakness with hip abductor and hip flexion compared to the contralateral side as well.  Antalgic gait noted    Impression and Recommendations:     The above documentation has been reviewed and is accurate and complete Judi Saa, DO

## 2023-06-16 ENCOUNTER — Ambulatory Visit (INDEPENDENT_AMBULATORY_CARE_PROVIDER_SITE_OTHER)

## 2023-06-16 ENCOUNTER — Ambulatory Visit: Admitting: Family Medicine

## 2023-06-16 VITALS — BP 108/60 | HR 86 | Ht 69.0 in | Wt 224.6 lb

## 2023-06-16 DIAGNOSIS — M47816 Spondylosis without myelopathy or radiculopathy, lumbar region: Secondary | ICD-10-CM | POA: Diagnosis not present

## 2023-06-16 DIAGNOSIS — M545 Low back pain, unspecified: Secondary | ICD-10-CM

## 2023-06-16 DIAGNOSIS — M2550 Pain in unspecified joint: Secondary | ICD-10-CM | POA: Diagnosis not present

## 2023-06-16 DIAGNOSIS — M5416 Radiculopathy, lumbar region: Secondary | ICD-10-CM | POA: Diagnosis not present

## 2023-06-16 MED ORDER — KETOROLAC TROMETHAMINE 60 MG/2ML IM SOLN
60.0000 mg | Freq: Once | INTRAMUSCULAR | Status: AC
  Administered 2023-06-16: 60 mg via INTRAMUSCULAR

## 2023-06-16 MED ORDER — HORIZANT 600 MG PO TBCR: 1.0000 | EXTENDED_RELEASE_TABLET | Freq: Every evening | ORAL | 0 refills | Status: DC

## 2023-06-16 MED ORDER — METHYLPREDNISOLONE ACETATE 80 MG/ML IJ SUSP
80.0000 mg | Freq: Once | INTRAMUSCULAR | Status: AC
  Administered 2023-06-16: 80 mg via INTRAMUSCULAR

## 2023-06-16 NOTE — Assessment & Plan Note (Addendum)
 Patient unfortunately does have a left sided lumbar radiculopathy.  Does seem to be fairly severe overall.  Has had multiple other comorbidities that could contribute to it.  Patient's BMI is elevated.  Does have some weakness noted.  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  I did have an MRI from an outside facility in March 2024.  At that time had more of a left-sided S1 nerve impingement.  This seems to be more of an L3 nerve root impingement would be the adjacent segment disease with patient's previous laminectomy.  In addition to this patient does have a history of DISH.  Patient does have weakness noted with dorsi flexion that we will need to monitor.  Follow-up again in 6 to 8 weeks otherwise due to the severity of pain today Toradol and Depo-Medrol given today.  Avoid oral anti-inflammatories long-term secondary to the chronic kidney disease.

## 2023-06-16 NOTE — Patient Instructions (Addendum)
 Good to see you. Epidural at [facility] [phone] Full cocktail today. Xray on the way out. Return 5 weeks after epidural. Stop gabapentin. Start horizant 600 mg. If pain worsens, seek medical attention.

## 2023-06-19 ENCOUNTER — Telehealth: Payer: Self-pay

## 2023-06-19 NOTE — Telephone Encounter (Signed)
   Name: [name].  DOB: 1950-11-21  MRN: [id number]  Primary Cardiologist: None   Preoperative team, please contact this patient and set up a phone call appointment for further preoperative risk assessment. Please obtain consent and complete medication review. Thank you for your help.  I confirm that guidance regarding antiplatelet and oral anticoagulation therapy has been completed and, if necessary, noted below.  Per office protocol, patient can hold Eliquis for 3 days prior to procedure.   Patient will not need bridging with Lovenox (enoxaparin) around procedure.   Please note our protocol requires 3 day hold of anticoagulation for spinal procedures.   I also confirmed the patient resides in the state of [place]. As per [facility] Medical Board telemedicine laws, the patient must reside in the state in which the provider is licensed.   Napoleon Form, Leodis Rains, NP 06/19/2023, 11:53 AM [facility]

## 2023-06-19 NOTE — Telephone Encounter (Signed)
 Patient with diagnosis of atrial fibrillation on Eliquis for anticoagulation.    Procedure:   LUMBAR EPIDURAL   Date of Surgery:  Clearance TBD   CHA2DS2-VASc Score = 3   This indicates a 3.2% annual risk of stroke. The patient's score is based upon: CHF History: 0 HTN History: 1 Diabetes History: 1 Stroke History: 0 Vascular Disease History: 0 Age Score: 1 Gender Score: 0    CrCl 55 Platelet count 202  Per office protocol, patient can hold Eliquis for 3 days prior to procedure.   Patient will not need bridging with Lovenox (enoxaparin) around procedure.  Please note our protocol requires 3 day hold of anticoagulation for spinal procedures.   **This guidance is not considered finalized until pre-operative APP has relayed final recommendations.**

## 2023-06-19 NOTE — Telephone Encounter (Signed)
 Pharmacy please advise on holding Eliquis x 4 days prior to lumbar ESI scheduled for TBD. Thank you.

## 2023-06-19 NOTE — Telephone Encounter (Signed)
   Pre-operative Risk Assessment    Patient Name: [name].  DOB: [birth year] MRN: [id number]   Date of last office visit: 12/24/22 Maxine Glenn, PA-C Date of next office visit: NONE   Request for Surgical Clearance    Procedure:   LUMBAR EPIDURAL  Date of Surgery:  Clearance TBD                                Surgeon:  NOT INDICATED Surgeon's Group or Practice Name:  [facility] Phone number:  [phone] Fax number:  [phone]   Type of Clearance Requested:   - Medical  - Pharmacy:  Hold Apixaban (Eliquis) 4 DOSES BEFORE PROCEDURE   Type of Anesthesia:  Not Indicated (LOCAL?)   Additional requests/questions:    Signed, [name]   06/19/2023, 8:34 AM

## 2023-06-19 NOTE — Telephone Encounter (Signed)
 [name] send me a secure chart about anaesthesia to be used will be lidocaine injection.

## 2023-06-19 NOTE — Telephone Encounter (Signed)
 Patient has been scheduled med rec and consent done     Patient Consent for Virtual Visit         [name]. has provided verbal consent on 06/19/2023 for a virtual visit (video or telephone).   CONSENT FOR VIRTUAL VISIT FOR:  [name].  By participating in this virtual visit I agree to the following:  I hereby voluntarily request, consent and authorize [facility] and its employed or contracted physicians, physician assistants, nurse practitioners or other licensed health care professionals (the Practitioner), to provide me with telemedicine health care services (the "Services") as deemed necessary by the treating Practitioner. I acknowledge and consent to receive the Services by the Practitioner via telemedicine. I understand that the telemedicine visit will involve communicating with the Practitioner through live audiovisual communication technology and the disclosure of certain medical information by electronic transmission. I acknowledge that I have been given the opportunity to request an in-person assessment or other available alternative prior to the telemedicine visit and am voluntarily participating in the telemedicine visit.  I understand that I have the right to withhold or withdraw my consent to the use of telemedicine in the course of my care at any time, without affecting my right to future care or treatment, and that the Practitioner or I may terminate the telemedicine visit at any time. I understand that I have the right to inspect all information obtained and/or recorded in the course of the telemedicine visit and may receive copies of available information for a reasonable fee.  I understand that some of the potential risks of receiving the Services via telemedicine include:  Delay or interruption in medical evaluation due to technological equipment failure or disruption; Information transmitted may not be sufficient (e.g. poor resolution of images) to  allow for appropriate medical decision making by the Practitioner; and/or  In rare instances, security protocols could fail, causing a breach of personal health information.  Furthermore, I acknowledge that it is my responsibility to provide information about my medical history, conditions and care that is complete and accurate to the best of my ability. I acknowledge that Practitioner's advice, recommendations, and/or decision may be based on factors not within their control, such as incomplete or inaccurate data provided by me or distortions of diagnostic images or specimens that may result from electronic transmissions. I understand that the practice of medicine is not an exact science and that Practitioner makes no warranties or guarantees regarding treatment outcomes. I acknowledge that a copy of this consent can be made available to me via my patient portal [facility] MyChart), or I can request a printed copy by calling the office of [facility].    I understand that my insurance will be billed for this visit.   I have read or had this consent read to me. I understand the contents of this consent, which adequately explains the benefits and risks of the Services being provided via telemedicine.  I have been provided ample opportunity to ask questions regarding this consent and the Services and have had my questions answered to my satisfaction. I give my informed consent for the services to be provided through the use of telemedicine in my medical care

## 2023-06-19 NOTE — Telephone Encounter (Signed)
 Patient has been scheduled for tele appt

## 2023-06-22 ENCOUNTER — Ambulatory Visit: Attending: Internal Medicine | Admitting: Emergency Medicine

## 2023-06-22 DIAGNOSIS — Z85038 Personal history of other malignant neoplasm of large intestine: Secondary | ICD-10-CM | POA: Diagnosis not present

## 2023-06-22 DIAGNOSIS — Z01810 Encounter for preprocedural cardiovascular examination: Secondary | ICD-10-CM

## 2023-06-22 DIAGNOSIS — Z9049 Acquired absence of other specified parts of digestive tract: Secondary | ICD-10-CM | POA: Diagnosis not present

## 2023-06-22 DIAGNOSIS — E538 Deficiency of other specified B group vitamins: Secondary | ICD-10-CM | POA: Diagnosis not present

## 2023-06-22 NOTE — Progress Notes (Signed)
 Virtual Visit via Telephone Note   Because of [name]. co-morbid illnesses, he is at least at moderate risk for complications without adequate follow up.  This format is felt to be most appropriate for this patient at this time.  Due to technical limitations with video connection (technology), today's appointment will be conducted as an audio only telehealth visit, and [name]. verbally agreed to proceed in this manner.   All issues noted in this document were discussed and addressed.  No physical exam could be performed with this format.  Evaluation Performed:  Preoperative cardiovascular risk assessment _____________   Date:  06/22/2023   Patient ID:  [name]., DOB [birth year], MRN [id number] Patient Location:  Home Provider location:   Office  Primary Care Provider:  Irena Reichmann, DO Primary Cardiologist:  None  Chief Complaint / Patient Profile   73 y.o. y/o male with a h/o hypertension, Mobitz 1 AVB, hyperlipidemia, chronic kidney disease, nonalcoholic fatty liver disease, HIV, type 2 diabetes, hemochromatosis, colon cancer who is pending lumbar epidural on date TBD by [facility] imaging spines and interventional radiology with surgeon not indicated and presents today for telephonic preoperative cardiovascular risk assessment.  History of Present Illness    Joshua Heidrich. is a 73 y.o. male who presents via audio/video conferencing for a telehealth visit today.  Pt was last seen in cardiology clinic on 12/24/2022 by Maxine Glenn, PA.  At that time Joshua Allnutt. was doing well.  The patient is now pending procedure as outlined above. Since his last visit, he denies chest pain, shortness of breath, lower extremity edema, fatigue, palpitations, melena, hematuria, hemoptysis, diaphoresis, weakness, presyncope, syncope, orthopnea, and PND.  Patient notes that he is doing well from a cardiac standpoint.  He denies any acute cardiovascular  concerns or complaints today.  He remains active without any anginal symptoms.  He does water aerobics twice weekly without cardiovascular symptoms.  He denies any exertional angina.  Past Medical History    Past Medical History:  Diagnosis Date   Complication of anesthesia    ANESTHESIA AWARENESS  DURING ADRENALECTOMY AND COLONOSCOPY   Diabetes mellitus    on oral meds-no insulin   DIABETES MELLITUS, TYPE II, UNCONTROLLED 06/22/2006   GILBERT'S SYNDROME 02/25/2007   GOUT 06/22/2006   HEMOCHROMATOSIS, HX OF 12/15/2005   phlebotomy every 6 to 8 weeks--[facility]   HIV disease (HCC) 1990   [facility]. HATCHER WITH [place] [facility]   HNP (herniated nucleus pulposus)    LUMBAR PAIN WITH PAIN DOWN LEFT LEG TO TOES-SOME NUMBNESS AND TINGLING IN BIG TOE   Hypertension    Past Surgical History:  Procedure Laterality Date   ADRENALECTOMY     RIGHT ADRENALECTOMY    CHOLECYSTECTOMY     LUMBAR LAMINECTOMY/DECOMPRESSION MICRODISCECTOMY  04/23/2011   Procedure: LUMBAR LAMINECTOMY/DECOMPRESSION MICRODISCECTOMY;  Surgeon: Javier Docker, MD;  Location: [facility];  Service: Orthopedics;  Laterality: N/A;  Decompression L5 S1 (X-Ray)   ORIF LEFT SHOULDER     STILL HAS HARDWARE IN THE SHOULDER   PACEMAKER IMPLANT N/A 04/11/2021   Procedure: PACEMAKER IMPLANT;  Surgeon: Regan Lemming, MD;  Location: [facility];  Service: Cardiovascular;  Laterality: N/A;    Allergies  Allergies  Allergen Reactions   Hydromorphone Itching and Rash   Scopolamine     GIVEN WITH A SURGERY YRS AGO--CAUSED HALLUNCINATIONS Other reaction(s): Confusion/Altered Mental Status Given during Surgery many years ago - Caused  Hallucinations GIVEN WITH A SURGERY YRS AGO--CAUSED HALLUNCINATIONS GIVEN WITH A SURGERY YRS AGO--CAUSED HALLUNCINATIONS   Beta Adrenergic Blockers Other (See Comments)    [place]--slowed heart rhythm  Other reaction(s): patient has [place]   Escitalopram  Oxalate Rash and Other (See Comments)    lightheaded,  visual  disturbances   Metformin Hcl Diarrhea and Nausea Only    Patient is tolerating    Codeine Itching   Morphine Sulfate Rash    Home Medications    Prior to Admission medications   Medication Sig Start Date End Date Taking? Authorizing Provider  acetaminophen (TYLENOL) 500 MG tablet Take 1,000 mg by mouth every 6 (six) hours as needed (for pain.).    [provider]  allopurinol (ZYLOPRIM) 300 MG tablet Take 300 mg by mouth in the morning.    [provider]  amLODipine (NORVASC) 5 MG tablet Take 1 tablet (5 mg total) by mouth daily. 04/09/21   Camnitz, Andree Coss, MD  apixaban (ELIQUIS) 5 MG TABS tablet Take 1 tablet (5 mg total) by mouth 2 (two) times daily. 11/12/22   Graciella Freer, PA-C  atorvastatin (LIPITOR) 10 MG tablet Take 10 mg by mouth at bedtime. 10/01/19   [provider]  BD PEN NEEDLE NANO 2ND GEN 32G X 4 MM MISC Inject into the skin daily. 07/09/20   [provider]  carvedilol (COREG) 25 MG tablet TAKE 1 TABLET(25 MG) BY MOUTH TWICE DAILY WITH A MEAL 05/05/22   Camnitz, Andree Coss, MD  cholestyramine Lanetta Inch) 4 GM/DOSE powder Take 4 g by mouth 3 (three) times daily with meals. 03/12/22   [provider]  colchicine 0.6 MG tablet Take 0.6 mg by mouth as needed (gout).     [provider]  Cyanocobalamin (VITAMIN B12) 1000 MCG TBCR Take 1 capsule by mouth daily. 07/01/21   [provider]  Dapagliflozin-metFORMIN HCl ER (XIGDUO XR) 12-998 MG TB24 Take 1 tablet by mouth daily.    [provider]  dextromethorphan-guaiFENesin (MUCINEX DM) 30-600 MG 12hr tablet Take 1 tablet by mouth 2 (two) times daily as needed (cough/congestion.). 04/12/20   [provider]  fexofenadine (ALLEGRA) 180 MG tablet Take 180 mg by mouth in the morning.    [provider]  furosemide (LASIX) 20 MG tablet Take 20 mg by mouth in the morning. 04/29/19    [provider]  gabapentin (NEURONTIN) 300 MG capsule TAKE ONE CAPSULE BY MOUTH TWICE DAILY Patient taking differently: Take 300-600 mg by mouth See admin instructions. Take 1 capsule (300 mg) by mouth in the morning & take 2 capsules (600 mg) by mouth at night. 10/16/16   Vivi Barrack, DPM  Gabapentin Enacarbil (HORIZANT) 600 MG TBCR Take 1 tablet (600 mg total) by mouth at bedtime. 06/16/23   Judi Saa, DO  glucose blood (ONETOUCH VERIO) test strip use to test three times daily as needed    [provider]  hydrALAZINE (APRESOLINE) 50 MG tablet Take 75 mg by mouth 3 (three) times daily. 12/07/19   [provider]  ODEFSEY 200-25-25 MG TABS tablet TAKE ONE TABLET DAILY WITH BREAKFAST 04/14/23   Ginnie Smart, MD  Omega-3 Fatty Acids (FISH OIL PO) Take 3 tablets by mouth daily. 1400 mg each    [provider]  SOLIQUA 100-33 UNT-MCG/ML SOPN Inject 60 Units into the skin in the morning. Injects 60 units daily 03/30/17   [provider]  Tavaborole (KERYDIN) 5 % SOLN Apply 1 drop  topically daily. Apply 1 drop to the toenail daily. 12/23/22   Charity Conch, DPM  telmisartan (MICARDIS) 80 MG tablet Take 80 mg by mouth daily before breakfast.    [provider]  testosterone cypionate (DEPOTESTOSTERONE CYPIONATE) 200 MG/ML injection Inject 200 mg into the muscle every 14 (fourteen) days. 07/08/19   [provider]  vitamin E 200 UNIT capsule Take 180 Units by mouth daily.    [provider]    Physical Exam    Vital Signs:  Cleave Ternes. does not have vital signs available for review today.  Given telephonic nature of communication, physical exam is limited. AAOx3. NAD. Normal affect.  Speech and respirations are unlabored.  Accessory Clinical Findings    None  Assessment & Plan    1.  Preoperative Cardiovascular Risk Assessment: According to the Revised Cardiac Risk Index (RCRI), his Perioperative  Risk of Major Cardiac Event is (%): 0.4. His Functional Capacity in METs is: 5.72 according to the Duke Activity Status Index (DASI). Therefore, based on ACC/AHA guidelines, patient would be at acceptable risk for the planned procedure without further cardiovascular testing.   The patient was advised that if he develops new symptoms prior to surgery to contact our office to arrange for a follow-up visit, and he verbalized understanding.  Per office protocol, patient can hold Eliquis for 3 days prior to procedure.   Patient will not need bridging with Lovenox (enoxaparin) around procedure.   Please note our protocol requires 3 day hold of anticoagulation for spinal procedures.   A copy of this note will be routed to requesting surgeon.  Time:   Today, I have spent 6 minutes with the patient with telehealth technology discussing medical history, symptoms, and management plan.     Ava Boatman, NP  06/22/2023, 1:12 PM

## 2023-06-23 ENCOUNTER — Encounter: Payer: Self-pay | Admitting: Family Medicine

## 2023-06-24 NOTE — Discharge Instructions (Signed)

## 2023-06-25 DIAGNOSIS — E291 Testicular hypofunction: Secondary | ICD-10-CM | POA: Diagnosis not present

## 2023-06-25 DIAGNOSIS — E538 Deficiency of other specified B group vitamins: Secondary | ICD-10-CM | POA: Diagnosis not present

## 2023-06-26 ENCOUNTER — Ambulatory Visit
Admission: RE | Admit: 2023-06-26 | Discharge: 2023-06-26 | Disposition: A | Discharge status: Home / Self Care | Source: Ambulatory Visit | Attending: Family Medicine | Admitting: Family Medicine

## 2023-06-26 DIAGNOSIS — M4727 Other spondylosis with radiculopathy, lumbosacral region: Secondary | ICD-10-CM | POA: Diagnosis not present

## 2023-06-26 DIAGNOSIS — M545 Low back pain, unspecified: Secondary | ICD-10-CM

## 2023-06-26 MED ORDER — IOPAMIDOL (ISOVUE-M 200) INJECTION 41%
1.0000 mL | Freq: Once | INTRAMUSCULAR | Status: AC
  Administered 2023-06-26: 1 mL via EPIDURAL

## 2023-06-26 MED ORDER — METHYLPREDNISOLONE ACETATE 40 MG/ML INJ SUSP (RADIOLOG
80.0000 mg | Freq: Once | INTRAMUSCULAR | Status: AC
  Administered 2023-06-26: 80 mg via EPIDURAL

## 2023-06-30 ENCOUNTER — Encounter: Admitting: Infectious Diseases

## 2023-07-01 ENCOUNTER — Ambulatory Visit: Admitting: Infectious Diseases

## 2023-07-01 ENCOUNTER — Other Ambulatory Visit: Payer: Self-pay

## 2023-07-01 VITALS — BP 129/69 | HR 90 | Temp 98.7°F | Resp 24 | Ht 69.0 in | Wt 226.8 lb

## 2023-07-01 DIAGNOSIS — Z113 Encounter for screening for infections with a predominantly sexual mode of transmission: Secondary | ICD-10-CM

## 2023-07-01 DIAGNOSIS — E1122 Type 2 diabetes mellitus with diabetic chronic kidney disease: Secondary | ICD-10-CM | POA: Diagnosis not present

## 2023-07-01 DIAGNOSIS — N182 Chronic kidney disease, stage 2 (mild): Secondary | ICD-10-CM

## 2023-07-01 DIAGNOSIS — I129 Hypertensive chronic kidney disease with stage 1 through stage 4 chronic kidney disease, or unspecified chronic kidney disease: Secondary | ICD-10-CM

## 2023-07-01 DIAGNOSIS — Z79899 Other long term (current) drug therapy: Secondary | ICD-10-CM

## 2023-07-01 DIAGNOSIS — I10 Essential (primary) hypertension: Secondary | ICD-10-CM

## 2023-07-01 DIAGNOSIS — N183 Chronic kidney disease, stage 3 unspecified: Secondary | ICD-10-CM

## 2023-07-01 DIAGNOSIS — B20 Human immunodeficiency virus [HIV] disease: Secondary | ICD-10-CM

## 2023-07-01 DIAGNOSIS — E118 Type 2 diabetes mellitus with unspecified complications: Secondary | ICD-10-CM
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
## 2023-07-01 NOTE — Assessment & Plan Note (Signed)
 Stable Appreciate Dr Wardell Guys f/u

## 2023-07-01 NOTE — Assessment & Plan Note (Signed)
 BP well controlled  appreciate CV and PCP f/u.

## 2023-07-01 NOTE — Assessment & Plan Note (Signed)
 Doing better Appreciate Dr Antoniette Klein f/u.

## 2023-07-01 NOTE — Assessment & Plan Note (Signed)
 He is doing well Re-assured him about his HIV RNA Will see him back in 9 months Vax are up todate

## 2023-07-13 ENCOUNTER — Other Ambulatory Visit: Payer: Self-pay

## 2023-07-13 DIAGNOSIS — E291 Testicular hypofunction: Secondary | ICD-10-CM | POA: Diagnosis not present

## 2023-07-13 DIAGNOSIS — E538 Deficiency of other specified B group vitamins: Secondary | ICD-10-CM | POA: Diagnosis not present

## 2023-07-13 MED ORDER — GABAPENTIN 300 MG PO CAPS: 600.0000 mg | ORAL_CAPSULE | Freq: Every day | ORAL | 0 refills | Status: DC

## 2023-07-14 ENCOUNTER — Ambulatory Visit (INDEPENDENT_AMBULATORY_CARE_PROVIDER_SITE_OTHER): Payer: BC Managed Care – PPO

## 2023-07-14 DIAGNOSIS — I441 Atrioventricular block, second degree: Secondary | ICD-10-CM | POA: Diagnosis not present

## 2023-07-14 LAB — CUP PACEART REMOTE DEVICE CHECK
Battery Remaining Longevity: 104 mo
Battery Voltage: 3 V
Brady Statistic AP VP Percent: 0.98 %
Brady Statistic AP VS Percent: 0 %
Brady Statistic AS VP Percent: 97.72 %
Brady Statistic AS VS Percent: 1.26 %
Brady Statistic RA Percent Paced: 1.05 %
Brady Statistic RV Percent Paced: 97.5 %
Date Time Interrogation Session: 20250505204200
Implantable Lead Connection Status: 753985
Implantable Lead Connection Status: 753985
Implantable Lead Implant Date: 20230202
Implantable Lead Implant Date: 20230202
Implantable Lead Location: 753859
Implantable Lead Location: 753860
Implantable Lead Model: 3830
Implantable Lead Model: 5076
Implantable Pulse Generator Implant Date: 20230202
Lead Channel Impedance Value: 304 Ohm
Lead Channel Impedance Value: 323 Ohm
Lead Channel Impedance Value: 380 Ohm
Lead Channel Impedance Value: 437 Ohm
Lead Channel Pacing Threshold Amplitude: 0.5 V
Lead Channel Pacing Threshold Amplitude: 1.25 V
Lead Channel Pacing Threshold Pulse Width: 0.4 ms
Lead Channel Pacing Threshold Pulse Width: 0.4 ms
Lead Channel Sensing Intrinsic Amplitude: 4.125 mV
Lead Channel Sensing Intrinsic Amplitude: 4.125 mV
Lead Channel Sensing Intrinsic Amplitude: 4.25 mV
Lead Channel Sensing Intrinsic Amplitude: 4.25 mV
Lead Channel Setting Pacing Amplitude: 1.5 V
Lead Channel Setting Pacing Amplitude: 2.5 V
Lead Channel Setting Pacing Pulse Width: 0.4 ms
Lead Channel Setting Sensing Sensitivity: 1.2 mV
Zone Setting Status: 755011

## 2023-07-15 ENCOUNTER — Telehealth: Payer: Self-pay

## 2023-07-15 NOTE — Telephone Encounter (Signed)
 PPM Scheduled remote reviewed. Normal device function.   Presenting rhythm:  AS-VP.  8 AHR detections, longest 22 hours, EGMs consistent with AF and AFL. AF burden 5.5%.  History of PAF, Eliquis  per Epic.   1 VHR consistent with NSVT lasting 20 beats in March.   Patient is past due for 55month follow up with Dr. Lawana Pray. Forwarding to scheduling team to set up in next month.

## 2023-07-16 ENCOUNTER — Ambulatory Visit: Admitting: Family Medicine

## 2023-07-31 ENCOUNTER — Inpatient Hospital Stay (HOSPITAL_COMMUNITY)
Admission: EM | Admit: 2023-07-31 | Discharge: 2023-08-04 | DRG: 552 | Disposition: A | Discharge status: Home Health | Attending: Student | Admitting: Student

## 2023-07-31 ENCOUNTER — Other Ambulatory Visit: Payer: Self-pay

## 2023-07-31 ENCOUNTER — Emergency Department (HOSPITAL_COMMUNITY)

## 2023-07-31 ENCOUNTER — Encounter (HOSPITAL_COMMUNITY): Payer: Self-pay

## 2023-07-31 DIAGNOSIS — Z6833 Body mass index (BMI) 33.0-33.9, adult: Secondary | ICD-10-CM

## 2023-07-31 DIAGNOSIS — R140 Abdominal distension (gaseous): Secondary | ICD-10-CM | POA: Diagnosis not present

## 2023-07-31 DIAGNOSIS — M109 Gout, unspecified: Secondary | ICD-10-CM | POA: Diagnosis present

## 2023-07-31 DIAGNOSIS — Z8249 Family history of ischemic heart disease and other diseases of the circulatory system: Secondary | ICD-10-CM

## 2023-07-31 DIAGNOSIS — E1142 Type 2 diabetes mellitus with diabetic polyneuropathy: Secondary | ICD-10-CM | POA: Diagnosis present

## 2023-07-31 DIAGNOSIS — S3991XA Unspecified injury of abdomen, initial encounter: Secondary | ICD-10-CM | POA: Diagnosis not present

## 2023-07-31 DIAGNOSIS — I10 Essential (primary) hypertension: Secondary | ICD-10-CM | POA: Diagnosis not present

## 2023-07-31 DIAGNOSIS — Z79899 Other long term (current) drug therapy: Secondary | ICD-10-CM

## 2023-07-31 DIAGNOSIS — Z9049 Acquired absence of other specified parts of digestive tract: Secondary | ICD-10-CM

## 2023-07-31 DIAGNOSIS — Y92410 Unspecified street and highway as the place of occurrence of the external cause: Secondary | ICD-10-CM

## 2023-07-31 DIAGNOSIS — I480 Paroxysmal atrial fibrillation: Secondary | ICD-10-CM | POA: Diagnosis not present

## 2023-07-31 DIAGNOSIS — Z794 Long term (current) use of insulin: Secondary | ICD-10-CM

## 2023-07-31 DIAGNOSIS — M160 Bilateral primary osteoarthritis of hip: Secondary | ICD-10-CM | POA: Diagnosis present

## 2023-07-31 DIAGNOSIS — S12300D Unspecified displaced fracture of fourth cervical vertebra, subsequent encounter for fracture with routine healing: Secondary | ICD-10-CM | POA: Diagnosis not present

## 2023-07-31 DIAGNOSIS — E11649 Type 2 diabetes mellitus with hypoglycemia without coma: Secondary | ICD-10-CM | POA: Diagnosis not present

## 2023-07-31 DIAGNOSIS — N189 Chronic kidney disease, unspecified: Secondary | ICD-10-CM | POA: Diagnosis present

## 2023-07-31 DIAGNOSIS — E1165 Type 2 diabetes mellitus with hyperglycemia: Secondary | ICD-10-CM | POA: Diagnosis not present

## 2023-07-31 DIAGNOSIS — N182 Chronic kidney disease, stage 2 (mild): Secondary | ICD-10-CM | POA: Diagnosis present

## 2023-07-31 DIAGNOSIS — S129XXA Fracture of neck, unspecified, initial encounter: Secondary | ICD-10-CM | POA: Diagnosis present

## 2023-07-31 DIAGNOSIS — I129 Hypertensive chronic kidney disease with stage 1 through stage 4 chronic kidney disease, or unspecified chronic kidney disease: Secondary | ICD-10-CM | POA: Diagnosis present

## 2023-07-31 DIAGNOSIS — E896 Postprocedural adrenocortical (-medullary) hypofunction: Secondary | ICD-10-CM | POA: Diagnosis present

## 2023-07-31 DIAGNOSIS — Z7985 Long-term (current) use of injectable non-insulin antidiabetic drugs: Secondary | ICD-10-CM

## 2023-07-31 DIAGNOSIS — E118 Type 2 diabetes mellitus with unspecified complications: Secondary | ICD-10-CM | POA: Diagnosis present

## 2023-07-31 DIAGNOSIS — S12300K Unspecified displaced fracture of fourth cervical vertebra, subsequent encounter for fracture with nonunion: Secondary | ICD-10-CM | POA: Diagnosis not present

## 2023-07-31 DIAGNOSIS — Z888 Allergy status to other drugs, medicaments and biological substances status: Secondary | ICD-10-CM

## 2023-07-31 DIAGNOSIS — S12301A Unspecified nondisplaced fracture of fourth cervical vertebra, initial encounter for closed fracture: Secondary | ICD-10-CM | POA: Diagnosis not present

## 2023-07-31 DIAGNOSIS — B20 Human immunodeficiency virus [HIV] disease: Secondary | ICD-10-CM | POA: Diagnosis present

## 2023-07-31 DIAGNOSIS — E669 Obesity, unspecified: Secondary | ICD-10-CM | POA: Diagnosis not present

## 2023-07-31 DIAGNOSIS — E7849 Other hyperlipidemia: Secondary | ICD-10-CM | POA: Diagnosis not present

## 2023-07-31 DIAGNOSIS — I700 Atherosclerosis of aorta: Secondary | ICD-10-CM | POA: Diagnosis not present

## 2023-07-31 DIAGNOSIS — E1122 Type 2 diabetes mellitus with diabetic chronic kidney disease: Secondary | ICD-10-CM | POA: Diagnosis not present

## 2023-07-31 DIAGNOSIS — Z7901 Long term (current) use of anticoagulants: Secondary | ICD-10-CM

## 2023-07-31 DIAGNOSIS — K760 Fatty (change of) liver, not elsewhere classified: Secondary | ICD-10-CM | POA: Diagnosis present

## 2023-07-31 DIAGNOSIS — S12300A Unspecified displaced fracture of fourth cervical vertebra, initial encounter for closed fracture: Principal | ICD-10-CM | POA: Diagnosis present

## 2023-07-31 DIAGNOSIS — E16A3 Hypoglycemia level 3: Secondary | ICD-10-CM | POA: Diagnosis present

## 2023-07-31 DIAGNOSIS — E162 Hypoglycemia, unspecified: Secondary | ICD-10-CM | POA: Diagnosis not present

## 2023-07-31 DIAGNOSIS — E785 Hyperlipidemia, unspecified: Secondary | ICD-10-CM | POA: Diagnosis present

## 2023-07-31 DIAGNOSIS — S0990XA Unspecified injury of head, initial encounter: Secondary | ICD-10-CM | POA: Diagnosis not present

## 2023-07-31 DIAGNOSIS — Z79890 Hormone replacement therapy (postmenopausal): Secondary | ICD-10-CM

## 2023-07-31 DIAGNOSIS — Z041 Encounter for examination and observation following transport accident: Secondary | ICD-10-CM | POA: Diagnosis not present

## 2023-07-31 DIAGNOSIS — S12301D Unspecified nondisplaced fracture of fourth cervical vertebra, subsequent encounter for fracture with routine healing: Secondary | ICD-10-CM | POA: Diagnosis not present

## 2023-07-31 DIAGNOSIS — N1831 Chronic kidney disease, stage 3a: Secondary | ICD-10-CM | POA: Diagnosis not present

## 2023-07-31 DIAGNOSIS — S299XXA Unspecified injury of thorax, initial encounter: Secondary | ICD-10-CM | POA: Diagnosis not present

## 2023-07-31 DIAGNOSIS — Z950 Presence of cardiac pacemaker: Secondary | ICD-10-CM

## 2023-07-31 DIAGNOSIS — S3993XA Unspecified injury of pelvis, initial encounter: Secondary | ICD-10-CM | POA: Diagnosis not present

## 2023-07-31 DIAGNOSIS — R4182 Altered mental status, unspecified: Secondary | ICD-10-CM | POA: Diagnosis present

## 2023-07-31 DIAGNOSIS — Z7984 Long term (current) use of oral hypoglycemic drugs: Secondary | ICD-10-CM

## 2023-07-31 DIAGNOSIS — Z885 Allergy status to narcotic agent status: Secondary | ICD-10-CM

## 2023-07-31 DIAGNOSIS — Z83438 Family history of other disorder of lipoprotein metabolism and other lipidemia: Secondary | ICD-10-CM

## 2023-07-31 LAB — I-STAT CHEM 8, ED
BUN: 24 mg/dL — ABNORMAL HIGH (ref 8–23)
Calcium, Ion: 1.02 mmol/L — ABNORMAL LOW (ref 1.15–1.40)
Chloride: 104 mmol/L (ref 98–111)
Creatinine, Ser: 1.5 mg/dL — ABNORMAL HIGH (ref 0.61–1.24)
Glucose, Bld: 71 mg/dL (ref 70–99)
HCT: 50 % (ref 39.0–52.0)
Hemoglobin: 17 g/dL (ref 13.0–17.0)
Potassium: 3.6 mmol/L (ref 3.5–5.1)
Sodium: 140 mmol/L (ref 135–145)
TCO2: 24 mmol/L (ref 22–32)

## 2023-07-31 LAB — COMPREHENSIVE METABOLIC PANEL WITH GFR
ALT: 19 U/L (ref 0–44)
AST: 26 U/L (ref 15–41)
Albumin: 4.3 g/dL (ref 3.5–5.0)
Alkaline Phosphatase: 80 U/L (ref 38–126)
Anion gap: 13 (ref 5–15)
BUN: 20 mg/dL (ref 8–23)
CO2: 23 mmol/L (ref 22–32)
Calcium: 9 mg/dL (ref 8.9–10.3)
Chloride: 103 mmol/L (ref 98–111)
Creatinine, Ser: 1.4 mg/dL — ABNORMAL HIGH (ref 0.61–1.24)
GFR, Estimated: 54 mL/min — ABNORMAL LOW (ref >60)
Glucose, Bld: 74 mg/dL (ref 70–99)
Potassium: 3.7 mmol/L (ref 3.5–5.1)
Sodium: 139 mmol/L (ref 135–145)
Total Bilirubin: 3.1 mg/dL — ABNORMAL HIGH (ref 0.0–1.2)
Total Protein: 7.9 g/dL (ref 6.5–8.1)

## 2023-07-31 LAB — CBC
HCT: 48.8 % (ref 39.0–52.0)
Hemoglobin: 15.6 g/dL (ref 13.0–17.0)
MCH: 30.2 pg (ref 26.0–34.0)
MCHC: 32 g/dL (ref 30.0–36.0)
MCV: 94.4 fL (ref 80.0–100.0)
Platelets: 204 10*3/uL (ref 150–400)
RBC: 5.17 MIL/uL (ref 4.22–5.81)
RDW: 14.8 % (ref 11.5–15.5)
WBC: 13.1 10*3/uL — ABNORMAL HIGH (ref 4.0–10.5)
nRBC: 0 % (ref 0.0–0.2)

## 2023-07-31 LAB — PROTIME-INR
INR: 1.2 (ref 0.8–1.2)
Prothrombin Time: 15.1 s (ref 11.4–15.2)

## 2023-07-31 LAB — CBG MONITORING, ED
Glucose-Capillary: 77 mg/dL (ref 70–99)
Glucose-Capillary: 78 mg/dL (ref 70–99)
Glucose-Capillary: 82 mg/dL (ref 70–99)

## 2023-07-31 LAB — SAMPLE TO BLOOD BANK

## 2023-07-31 LAB — I-STAT CG4 LACTIC ACID, ED: Lactic Acid, Venous: 1.4 mmol/L (ref 0.5–1.9)

## 2023-07-31 LAB — ETHANOL: Alcohol, Ethyl (B): <15 mg/dL (ref <15)

## 2023-07-31 MED ORDER — IOHEXOL 350 MG/ML SOLN
75.0000 mL | Freq: Once | INTRAVENOUS | Status: AC | PRN
  Administered 2023-07-31: 75 mL via INTRAVENOUS

## 2023-07-31 MED ORDER — DEXTROSE-SODIUM CHLORIDE 5-0.9 % IV SOLN: INTRAVENOUS | Status: AC
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
## 2023-08-01 DIAGNOSIS — E1142 Type 2 diabetes mellitus with diabetic polyneuropathy: Secondary | ICD-10-CM | POA: Diagnosis not present

## 2023-08-01 DIAGNOSIS — E1165 Type 2 diabetes mellitus with hyperglycemia: Secondary | ICD-10-CM | POA: Diagnosis not present

## 2023-08-01 DIAGNOSIS — E669 Obesity, unspecified: Secondary | ICD-10-CM | POA: Diagnosis present

## 2023-08-01 DIAGNOSIS — B20 Human immunodeficiency virus [HIV] disease: Secondary | ICD-10-CM | POA: Diagnosis present

## 2023-08-01 DIAGNOSIS — I10 Essential (primary) hypertension: Secondary | ICD-10-CM | POA: Diagnosis not present

## 2023-08-01 DIAGNOSIS — I129 Hypertensive chronic kidney disease with stage 1 through stage 4 chronic kidney disease, or unspecified chronic kidney disease: Secondary | ICD-10-CM | POA: Diagnosis present

## 2023-08-01 DIAGNOSIS — Z950 Presence of cardiac pacemaker: Secondary | ICD-10-CM | POA: Diagnosis not present

## 2023-08-01 DIAGNOSIS — S129XXA Fracture of neck, unspecified, initial encounter: Secondary | ICD-10-CM | POA: Diagnosis present

## 2023-08-01 DIAGNOSIS — I480 Paroxysmal atrial fibrillation: Secondary | ICD-10-CM | POA: Diagnosis present

## 2023-08-01 DIAGNOSIS — E162 Hypoglycemia, unspecified: Secondary | ICD-10-CM | POA: Diagnosis present

## 2023-08-01 DIAGNOSIS — E785 Hyperlipidemia, unspecified: Secondary | ICD-10-CM | POA: Diagnosis present

## 2023-08-01 DIAGNOSIS — Z7901 Long term (current) use of anticoagulants: Secondary | ICD-10-CM | POA: Diagnosis not present

## 2023-08-01 DIAGNOSIS — E1122 Type 2 diabetes mellitus with diabetic chronic kidney disease: Secondary | ICD-10-CM | POA: Diagnosis present

## 2023-08-01 DIAGNOSIS — N182 Chronic kidney disease, stage 2 (mild): Secondary | ICD-10-CM | POA: Diagnosis present

## 2023-08-01 DIAGNOSIS — S12300K Unspecified displaced fracture of fourth cervical vertebra, subsequent encounter for fracture with nonunion: Secondary | ICD-10-CM | POA: Diagnosis not present

## 2023-08-01 DIAGNOSIS — E16A3 Hypoglycemia level 3: Secondary | ICD-10-CM | POA: Diagnosis present

## 2023-08-01 DIAGNOSIS — Z7985 Long-term (current) use of injectable non-insulin antidiabetic drugs: Secondary | ICD-10-CM | POA: Diagnosis not present

## 2023-08-01 DIAGNOSIS — R140 Abdominal distension (gaseous): Secondary | ICD-10-CM | POA: Diagnosis not present

## 2023-08-01 DIAGNOSIS — K760 Fatty (change of) liver, not elsewhere classified: Secondary | ICD-10-CM | POA: Diagnosis present

## 2023-08-01 DIAGNOSIS — S12301D Unspecified nondisplaced fracture of fourth cervical vertebra, subsequent encounter for fracture with routine healing: Secondary | ICD-10-CM | POA: Diagnosis not present

## 2023-08-01 DIAGNOSIS — M160 Bilateral primary osteoarthritis of hip: Secondary | ICD-10-CM | POA: Diagnosis present

## 2023-08-01 DIAGNOSIS — Z6833 Body mass index (BMI) 33.0-33.9, adult: Secondary | ICD-10-CM | POA: Diagnosis not present

## 2023-08-01 DIAGNOSIS — E118 Type 2 diabetes mellitus with unspecified complications: Secondary | ICD-10-CM | POA: Diagnosis not present

## 2023-08-01 DIAGNOSIS — S12300D Unspecified displaced fracture of fourth cervical vertebra, subsequent encounter for fracture with routine healing: Secondary | ICD-10-CM | POA: Diagnosis not present

## 2023-08-01 DIAGNOSIS — E896 Postprocedural adrenocortical (-medullary) hypofunction: Secondary | ICD-10-CM | POA: Diagnosis present

## 2023-08-01 DIAGNOSIS — M109 Gout, unspecified: Secondary | ICD-10-CM | POA: Diagnosis present

## 2023-08-01 DIAGNOSIS — S12300A Unspecified displaced fracture of fourth cervical vertebra, initial encounter for closed fracture: Secondary | ICD-10-CM | POA: Diagnosis present

## 2023-08-01 DIAGNOSIS — S12301A Unspecified nondisplaced fracture of fourth cervical vertebra, initial encounter for closed fracture: Secondary | ICD-10-CM | POA: Diagnosis not present

## 2023-08-01 DIAGNOSIS — Z794 Long term (current) use of insulin: Secondary | ICD-10-CM | POA: Diagnosis not present

## 2023-08-01 DIAGNOSIS — Y92410 Unspecified street and highway as the place of occurrence of the external cause: Secondary | ICD-10-CM | POA: Diagnosis not present

## 2023-08-01 DIAGNOSIS — Z8249 Family history of ischemic heart disease and other diseases of the circulatory system: Secondary | ICD-10-CM | POA: Diagnosis not present

## 2023-08-01 DIAGNOSIS — E11649 Type 2 diabetes mellitus with hypoglycemia without coma: Secondary | ICD-10-CM | POA: Diagnosis present

## 2023-08-01 LAB — CBG MONITORING, ED
Glucose-Capillary: 104 mg/dL — ABNORMAL HIGH (ref 70–99)
Glucose-Capillary: 116 mg/dL — ABNORMAL HIGH (ref 70–99)
Glucose-Capillary: 131 mg/dL — ABNORMAL HIGH (ref 70–99)
Glucose-Capillary: 194 mg/dL — ABNORMAL HIGH (ref 70–99)
Glucose-Capillary: 57 mg/dL — ABNORMAL LOW (ref 70–99)
Glucose-Capillary: 71 mg/dL (ref 70–99)
Glucose-Capillary: 72 mg/dL (ref 70–99)
Glucose-Capillary: 73 mg/dL (ref 70–99)
Glucose-Capillary: 75 mg/dL (ref 70–99)
Glucose-Capillary: 90 mg/dL (ref 70–99)

## 2023-08-01 LAB — COMPREHENSIVE METABOLIC PANEL WITH GFR
ALT: 15 U/L (ref 0–44)
AST: 20 U/L (ref 15–41)
Albumin: 3.4 g/dL — ABNORMAL LOW (ref 3.5–5.0)
Alkaline Phosphatase: 68 U/L (ref 38–126)
Anion gap: 8 (ref 5–15)
BUN: 13 mg/dL (ref 8–23)
CO2: 23 mmol/L (ref 22–32)
Calcium: 7.9 mg/dL — ABNORMAL LOW (ref 8.9–10.3)
Chloride: 110 mmol/L (ref 98–111)
Creatinine, Ser: 1.18 mg/dL (ref 0.61–1.24)
GFR, Estimated: >60 mL/min (ref >60)
Glucose, Bld: 209 mg/dL — ABNORMAL HIGH (ref 70–99)
Potassium: 3.6 mmol/L (ref 3.5–5.1)
Sodium: 141 mmol/L (ref 135–145)
Total Bilirubin: 3 mg/dL — ABNORMAL HIGH (ref 0.0–1.2)
Total Protein: 6.4 g/dL — ABNORMAL LOW (ref 6.5–8.1)

## 2023-08-01 LAB — CBC
HCT: 44.7 % (ref 39.0–52.0)
Hemoglobin: 14.5 g/dL (ref 13.0–17.0)
MCH: 30.3 pg (ref 26.0–34.0)
MCHC: 32.4 g/dL (ref 30.0–36.0)
MCV: 93.3 fL (ref 80.0–100.0)
Platelets: 196 10*3/uL (ref 150–400)
RBC: 4.79 MIL/uL (ref 4.22–5.81)
RDW: 14.7 % (ref 11.5–15.5)
WBC: 9.2 10*3/uL (ref 4.0–10.5)
nRBC: 0 % (ref 0.0–0.2)

## 2023-08-01 LAB — URINALYSIS, ROUTINE W REFLEX MICROSCOPIC
Bacteria, UA: NONE SEEN
Bilirubin Urine: NEGATIVE
Glucose, UA: >=500 mg/dL — AB
Hgb urine dipstick: NEGATIVE
Ketones, ur: 5 mg/dL — AB
Leukocytes,Ua: NEGATIVE
Nitrite: NEGATIVE
Protein, ur: NEGATIVE mg/dL
Specific Gravity, Urine: 1.027 (ref 1.005–1.030)
pH: 5 (ref 5.0–8.0)

## 2023-08-01 LAB — GLUCOSE, CAPILLARY
Glucose-Capillary: 101 mg/dL — ABNORMAL HIGH (ref 70–99)
Glucose-Capillary: 110 mg/dL — ABNORMAL HIGH (ref 70–99)

## 2023-08-01 MED ORDER — TRAZODONE HCL 50 MG PO TABS
50.0000 mg | ORAL_TABLET | Freq: Every day | ORAL | Status: DC
  Administered 2023-08-01 – 2023-08-03 (×3): 50 mg via ORAL
  Filled 2023-08-01 (×3): qty 1

## 2023-08-01 MED ORDER — ENOXAPARIN SODIUM 40 MG/0.4ML IJ SOSY: 40.0000 mg | PREFILLED_SYRINGE | INTRAMUSCULAR | Status: DC

## 2023-08-01 MED ORDER — CARVEDILOL 12.5 MG PO TABS
25.0000 mg | ORAL_TABLET | Freq: Two times a day (BID) | ORAL | Status: DC
  Administered 2023-08-01 – 2023-08-04 (×6): 25 mg via ORAL
  Filled 2023-08-01 (×6): qty 2

## 2023-08-01 MED ORDER — IRBESARTAN 300 MG PO TABS
300.0000 mg | ORAL_TABLET | Freq: Every day | ORAL | Status: DC
  Administered 2023-08-01 – 2023-08-04 (×4): 300 mg via ORAL
  Filled 2023-08-01 (×4): qty 1

## 2023-08-01 MED ORDER — EMTRICITAB-RILPIVIR-TENOFOV AF 200-25-25 MG PO TABS
1.0000 | ORAL_TABLET | Freq: Every day | ORAL | Status: DC
  Administered 2023-08-01 – 2023-08-03 (×3): 1 via ORAL
  Filled 2023-08-01 (×3): qty 1

## 2023-08-01 MED ORDER — ONDANSETRON HCL 4 MG/2ML IJ SOLN
4.0000 mg | Freq: Once | INTRAMUSCULAR | Status: AC
  Administered 2023-08-01: 4 mg via INTRAVENOUS
  Filled 2023-08-01: qty 2

## 2023-08-01 MED ORDER — GABAPENTIN 300 MG PO CAPS
600.0000 mg | ORAL_CAPSULE | Freq: Three times a day (TID) | ORAL | Status: DC
  Administered 2023-08-01 – 2023-08-04 (×10): 600 mg via ORAL
  Filled 2023-08-01 (×10): qty 2

## 2023-08-01 MED ORDER — ACETAMINOPHEN 325 MG PO TABS: 650.0000 mg | ORAL_TABLET | Freq: Four times a day (QID) | ORAL | Status: DC | PRN

## 2023-08-01 MED ORDER — OXYCODONE HCL 5 MG PO TABS
5.0000 mg | ORAL_TABLET | ORAL | Status: DC | PRN
  Administered 2023-08-01 – 2023-08-04 (×11): 5 mg via ORAL
  Filled 2023-08-01 (×11): qty 1

## 2023-08-01 MED ORDER — POLYETHYLENE GLYCOL 3350 17 G PO PACK: 17.0000 g | PACK | Freq: Every day | ORAL | Status: DC | PRN

## 2023-08-01 MED ORDER — ALLOPURINOL 100 MG PO TABS
300.0000 mg | ORAL_TABLET | Freq: Every morning | ORAL | Status: DC
  Administered 2023-08-01 – 2023-08-04 (×4): 300 mg via ORAL
  Filled 2023-08-01 (×4): qty 3

## 2023-08-01 MED ORDER — DEXTROSE 50 % IV SOLN
INTRAVENOUS | Status: AC
Start: 2023-08-01 — End: 2023-08-01
  Administered 2023-08-01: 50 mL via INTRAVENOUS
  Filled 2023-08-01: qty 50

## 2023-08-01 MED ORDER — HYDRALAZINE HCL 50 MG PO TABS
75.0000 mg | ORAL_TABLET | Freq: Three times a day (TID) | ORAL | Status: DC
  Administered 2023-08-01 – 2023-08-04 (×10): 75 mg via ORAL
  Filled 2023-08-01 (×2): qty 1
  Filled 2023-08-01: qty 3
  Filled 2023-08-01 (×6): qty 1
  Filled 2023-08-01: qty 3

## 2023-08-01 MED ORDER — ACETAMINOPHEN 650 MG RE SUPP: 650.0000 mg | Freq: Four times a day (QID) | RECTAL | Status: DC | PRN

## 2023-08-01 MED ORDER — AMLODIPINE BESYLATE 5 MG PO TABS
5.0000 mg | ORAL_TABLET | Freq: Every day | ORAL | Status: DC
  Administered 2023-08-01 – 2023-08-04 (×4): 5 mg via ORAL
  Filled 2023-08-01 (×4): qty 1

## 2023-08-01 MED ORDER — ONDANSETRON HCL 4 MG PO TABS: 4.0000 mg | ORAL_TABLET | Freq: Four times a day (QID) | ORAL | Status: DC | PRN

## 2023-08-01 MED ORDER — ONDANSETRON HCL 4 MG/2ML IJ SOLN
4.0000 mg | Freq: Four times a day (QID) | INTRAMUSCULAR | Status: DC | PRN
  Administered 2023-08-02 (×2): 4 mg via INTRAVENOUS
  Filled 2023-08-01 (×2): qty 2

## 2023-08-01 MED ORDER — LOPERAMIDE HCL 2 MG PO CAPS
2.0000 mg | ORAL_CAPSULE | ORAL | Status: DC | PRN
  Administered 2023-08-01: 2 mg via ORAL
  Filled 2023-08-01: qty 1

## 2023-08-01 MED ORDER — ATORVASTATIN CALCIUM 10 MG PO TABS
10.0000 mg | ORAL_TABLET | Freq: Every day | ORAL | Status: DC
  Administered 2023-08-01 – 2023-08-03 (×3): 10 mg via ORAL
  Filled 2023-08-01 (×3): qty 1

## 2023-08-01 MED ORDER — ENOXAPARIN SODIUM 100 MG/ML IJ SOSY
100.0000 mg | PREFILLED_SYRINGE | Freq: Two times a day (BID) | INTRAMUSCULAR | Status: DC
  Administered 2023-08-01 – 2023-08-04 (×7): 100 mg via SUBCUTANEOUS
  Filled 2023-08-01 (×7): qty 1

## 2023-08-01 MED ORDER — DEXTROSE 50 % IV SOLN: 50.0000 mL | Freq: Once | INTRAVENOUS | Status: AC

## 2023-08-01 MED ORDER — FUROSEMIDE 20 MG PO TABS
20.0000 mg | ORAL_TABLET | Freq: Every morning | ORAL | Status: DC
  Administered 2023-08-01 – 2023-08-04 (×4): 20 mg via ORAL
  Filled 2023-08-01 (×4): qty 1

## 2023-08-01 MED ORDER — MELATONIN 5 MG PO TABS: 5.0000 mg | ORAL_TABLET | Freq: Every evening | ORAL | Status: DC | PRN

## 2023-08-01 MED ORDER — FENTANYL CITRATE PF 50 MCG/ML IJ SOSY
12.5000 ug | PREFILLED_SYRINGE | INTRAMUSCULAR | Status: DC | PRN
  Administered 2023-08-01 – 2023-08-03 (×6): 12.5 ug via INTRAVENOUS
  Filled 2023-08-01 (×6): qty 1
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
## 2023-08-01 NOTE — ED Notes (Signed)
 Sister Amie Bald [phone] would like an update immediately
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
## 2023-08-01 NOTE — ED Notes (Signed)
 Please update sister Larinda Plover [phone]
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
## 2023-08-01 NOTE — ED Notes (Signed)
 Hey, pt brother(glenn) requesting update on pt & RM# info when ready. call back # [phone]

## 2023-08-02 ENCOUNTER — Inpatient Hospital Stay (HOSPITAL_COMMUNITY)

## 2023-08-02 DIAGNOSIS — S12301D Unspecified nondisplaced fracture of fourth cervical vertebra, subsequent encounter for fracture with routine healing: Secondary | ICD-10-CM

## 2023-08-02 LAB — CBC
HCT: 47.4 % (ref 39.0–52.0)
Hemoglobin: 15.1 g/dL (ref 13.0–17.0)
MCH: 30.1 pg (ref 26.0–34.0)
MCHC: 31.9 g/dL (ref 30.0–36.0)
MCV: 94.6 fL (ref 80.0–100.0)
Platelets: 176 10*3/uL (ref 150–400)
RBC: 5.01 MIL/uL (ref 4.22–5.81)
RDW: 15 % (ref 11.5–15.5)
WBC: 8.8 10*3/uL (ref 4.0–10.5)
nRBC: 0 % (ref 0.0–0.2)

## 2023-08-02 LAB — BASIC METABOLIC PANEL WITH GFR
Anion gap: 7 (ref 5–15)
BUN: 12 mg/dL (ref 8–23)
CO2: 26 mmol/L (ref 22–32)
Calcium: 8.1 mg/dL — ABNORMAL LOW (ref 8.9–10.3)
Chloride: 107 mmol/L (ref 98–111)
Creatinine, Ser: 1.34 mg/dL — ABNORMAL HIGH (ref 0.61–1.24)
GFR, Estimated: 57 mL/min — ABNORMAL LOW (ref >60)
Glucose, Bld: 121 mg/dL — ABNORMAL HIGH (ref 70–99)
Potassium: 3.5 mmol/L (ref 3.5–5.1)
Sodium: 140 mmol/L (ref 135–145)

## 2023-08-02 LAB — GLUCOSE, CAPILLARY
Glucose-Capillary: 115 mg/dL — ABNORMAL HIGH (ref 70–99)
Glucose-Capillary: 110 mg/dL — ABNORMAL HIGH (ref 70–99)
Glucose-Capillary: 109 mg/dL — ABNORMAL HIGH (ref 70–99)
Glucose-Capillary: 122 mg/dL — ABNORMAL HIGH (ref 70–99)
Glucose-Capillary: 146 mg/dL — ABNORMAL HIGH (ref 70–99)
Glucose-Capillary: 137 mg/dL — ABNORMAL HIGH (ref 70–99)
Glucose-Capillary: 201 mg/dL — ABNORMAL HIGH (ref 70–99)

## 2023-08-02 MED ORDER — POTASSIUM CHLORIDE 20 MEQ PO PACK
40.0000 meq | PACK | Freq: Once | ORAL | Status: AC
  Administered 2023-08-02: 40 meq via ORAL
  Filled 2023-08-02: qty 2

## 2023-08-02 MED ORDER — CHLORHEXIDINE GLUCONATE CLOTH 2 % EX PADS
6.0000 | MEDICATED_PAD | Freq: Every day | CUTANEOUS | Status: DC
  Administered 2023-08-02: 6 via TOPICAL

## 2023-08-02 MED ORDER — METOCLOPRAMIDE HCL 10 MG PO TABS
5.0000 mg | ORAL_TABLET | Freq: Three times a day (TID) | ORAL | Status: DC
  Administered 2023-08-02 – 2023-08-04 (×7): 5 mg via ORAL
  Filled 2023-08-02 (×7): qty 1
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
## 2023-08-02 NOTE — Progress Notes (Addendum)
 Pt had 4 loose bowel movement today,  informed MD, prn meds were given as ordered.
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
## 2023-08-02 NOTE — Plan of Care (Signed)
  Problem: Education: Goal: Knowledge of General Education information will improve Description: Including pain rating scale, medication(s)/side effects and non-pharmacologic comfort measures Outcome: Progressing   Problem: Health Behavior/Discharge Planning: Goal: Ability to manage health-related needs will improve Outcome: Progressing   Problem: Clinical Measurements: Goal: Will remain free from infection Outcome: Progressing   Problem: Activity: Goal: Risk for activity intolerance will decrease Outcome: Progressing   Problem: Nutrition: Goal: Adequate nutrition will be maintained Outcome: Progressing   Problem: Elimination: Goal: Will not experience complications related to bowel motility Outcome: Progressing   Problem: Elimination: Goal: Will not experience complications related to urinary retention Outcome: Progressing   Problem: Pain Managment: Goal: General experience of comfort will improve and/or be controlled Outcome: Progressing   Problem: Safety: Goal: Ability to remain free from injury will improve Outcome: Progressing   Problem: Skin Integrity: Goal: Risk for impaired skin integrity will decrease Outcome: Progressing

## 2023-08-03 DIAGNOSIS — S12300D Unspecified displaced fracture of fourth cervical vertebra, subsequent encounter for fracture with routine healing: Secondary | ICD-10-CM

## 2023-08-03 LAB — GLUCOSE, CAPILLARY
Glucose-Capillary: 107 mg/dL — ABNORMAL HIGH (ref 70–99)
Glucose-Capillary: 166 mg/dL — ABNORMAL HIGH (ref 70–99)
Glucose-Capillary: 167 mg/dL — ABNORMAL HIGH (ref 70–99)
Glucose-Capillary: 199 mg/dL — ABNORMAL HIGH (ref 70–99)
Glucose-Capillary: 207 mg/dL — ABNORMAL HIGH (ref 70–99)
Glucose-Capillary: 98 mg/dL (ref 70–99)

## 2023-08-03 LAB — RENAL FUNCTION PANEL
Albumin: 3.2 g/dL — ABNORMAL LOW (ref 3.5–5.0)
Anion gap: 11 (ref 5–15)
BUN: 15 mg/dL (ref 8–23)
CO2: 24 mmol/L (ref 22–32)
Calcium: 8.2 mg/dL — ABNORMAL LOW (ref 8.9–10.3)
Chloride: 102 mmol/L (ref 98–111)
Creatinine, Ser: 1.42 mg/dL — ABNORMAL HIGH (ref 0.61–1.24)
GFR, Estimated: 53 mL/min — ABNORMAL LOW (ref >60)
Glucose, Bld: 117 mg/dL — ABNORMAL HIGH (ref 70–99)
Phosphorus: 2.9 mg/dL (ref 2.5–4.6)
Potassium: 3.8 mmol/L (ref 3.5–5.1)
Sodium: 137 mmol/L (ref 135–145)

## 2023-08-03 LAB — HEMOGLOBIN A1C
Hgb A1c MFr Bld: 5.1 % (ref 4.8–5.6)
Mean Plasma Glucose: 99.67 mg/dL

## 2023-08-03 LAB — MAGNESIUM: Magnesium: 2 mg/dL (ref 1.7–2.4)
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
## 2023-08-03 NOTE — Progress Notes (Signed)
 Transition of Care [facility]) - Inpatient Brief Assessment   Patient Details  Name: [name]. MRN: [id number] Date of Birth: 1950-10-26  [facility] CM/SW Contact:    Dane Dung, RN Phone Number: 08/03/2023, 9:17 AM   Clinical Narrative: CM met with the patient at the bedside to discuss TOC needs to return home.  Patient lives alone and states that he will have better family support at home tomorrow.  DME at the home includes a glucometer.  Patient was provided with Medicare choice regarding DME and home health agency and patient did not have a preference.  I placed HH orders for PT/OT and DME orders for RW and 3:1 - to be co-signed by the MD.  [facility] was called and 3:1 and RW to be delivered to the hospital room today.  [facility] was called and HH was set up for PT/OT.  Patient plans to have family provide transportation to home via car likely tomorrow.   Transition of Care Asessment: Insurance and Status: (P) Insurance coverage has been reviewed Patient has primary care physician: (P) Yes Home environment has been reviewed: (P) from home alone Prior level of function:: (P) Independent Prior/Current Home Services: (P) No current home services Social Drivers of Health Review: (P) SDOH reviewed interventions complete Readmission risk has been reviewed: (P) Yes Transition of care needs: (P) transition of care needs identified, TOC will continue to follow

## 2023-08-03 NOTE — Progress Notes (Signed)
 Orthopedic Tech Progress Note Patient Details:  [name]. [birth year] [id number]  Ortho Devices Type of Ortho Device: [place] cervical collar Ortho Device/Splint Location: Neck Ortho Device/Splint Interventions: Ordered      [name] [name] 08/03/2023, 11:05 AM
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
## 2023-08-03 NOTE — Progress Notes (Signed)
    Durable Medical Equipment  (From admission, onward)           Start     Ordered   08/03/23 0903  For home use only DME Bedside commode  Once       Comments: Please provide 3:1 to be used by the bedside/living space for mobility safety  Question:  Patient needs a bedside commode to treat with the following condition  Answer:  Cervical spine fracture (HCC)   08/03/23 0903   08/03/23 0901  For home use only DME Walker rolling  Once       Question Answer Comment  Walker: With 5 Inch Wheels   Patient needs a walker to treat with the following condition Cervical spine fracture (HCC)      08/03/23 1610

## 2023-08-03 NOTE — Plan of Care (Signed)
 Calm, comfortably resting, no other pain complaints noted throughout this time. Problem: Education: Goal: Knowledge of General Education information will improve Description: Including pain rating scale, medication(s)/side effects and non-pharmacologic comfort measures Outcome: Progressing   P Problem: Clinical Measurements: Goal: Will remain free from infection Outcome: Progressing   Problem: Activity: Goal: Risk for activity intolerance will decrease Outcome: Progressing   Problem: Nutrition: Goal: Adequate nutrition will be maintained Outcome: Progressing   Problem: Coping: Goal: Level of anxiety will decrease Outcome: Progressing   Problem: Elimination: Goal: Will not experience complications related to bowel motility Outcome: Progressing   Problem: Pain Managment: Goal: General experience of comfort will improve and/or be controlled Outcome: Progressing   Problem: Safety: Goal: Ability to remain free from injury will improve Outcome: Progressing   Problem: Skin Integrity: Goal: Risk for impaired skin integrity will decrease Outcome: Progressing
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
## 2023-08-04 ENCOUNTER — Telehealth: Payer: Self-pay | Admitting: Student

## 2023-08-04 DIAGNOSIS — I10 Essential (primary) hypertension: Secondary | ICD-10-CM

## 2023-08-04 DIAGNOSIS — E1142 Type 2 diabetes mellitus with diabetic polyneuropathy: Secondary | ICD-10-CM

## 2023-08-04 DIAGNOSIS — S12301A Unspecified nondisplaced fracture of fourth cervical vertebra, initial encounter for closed fracture: Secondary | ICD-10-CM | POA: Diagnosis not present

## 2023-08-04 DIAGNOSIS — N1831 Chronic kidney disease, stage 3a: Secondary | ICD-10-CM

## 2023-08-04 DIAGNOSIS — E7849 Other hyperlipidemia: Secondary | ICD-10-CM

## 2023-08-04 DIAGNOSIS — E118 Type 2 diabetes mellitus with unspecified complications: Secondary | ICD-10-CM | POA: Diagnosis not present

## 2023-08-04 LAB — GLUCOSE, CAPILLARY
Glucose-Capillary: 124 mg/dL — ABNORMAL HIGH (ref 70–99)
Glucose-Capillary: 125 mg/dL — ABNORMAL HIGH (ref 70–99)
Glucose-Capillary: 159 mg/dL — ABNORMAL HIGH (ref 70–99)

## 2023-08-04 MED ORDER — OXYCODONE HCL 5 MG PO TABS: 5.0000 mg | ORAL_TABLET | Freq: Three times a day (TID) | ORAL | 0 refills | Status: AC | PRN

## 2023-08-04 MED ORDER — SENNOSIDES-DOCUSATE SODIUM 8.6-50 MG PO TABS: 2.0000 | ORAL_TABLET | Freq: Two times a day (BID) | ORAL | Status: AC | PRN

## 2023-08-04 NOTE — TOC CAGE-AID Note (Signed)
 Transition of Care [facility]) - CAGE-AID Screening   Patient Details  Name: [name]. MRN: [id number] Date of Birth: 04-Feb-1951  [facility] CM/SW Contact:    Chonte Ricke E Nyasha Rahilly, LCSW Phone Number: 08/04/2023, 8:46 AM   Clinical Narrative:    CAGE-AID Screening:    Have You Ever Felt You Ought to Cut Down on Your Drinking or Drug Use?: No Have People Annoyed You By [employer] Your Drinking Or Drug Use?: No Have You Felt Bad Or Guilty About Your Drinking Or Drug Use?: No Have You Ever Had a Drink or Used Drugs First Thing In The Morning to Steady Your Nerves or to Get Rid of a Hangover?: No CAGE-AID Score: 0  Substance Abuse Education Offered: No
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
## 2023-08-04 NOTE — TOC Transition Note (Signed)
 Transition of Care [facility]) - Discharge Note   Patient Details  Name: [name]. MRN: [id number] Date of Birth: [birth year]  Transition of [facility]) CM/SW Contact:  Jonathan Neighbor, RN Phone Number: 08/04/2023, 10:29 AM   Clinical Narrative:     Pt is discharging home with [facility] for [facility] health. [name] will contact him for the first home visit.  DME for home is at the bedside.  Pt has transportation home.  Final next level of care: Home w Home Health Services Barriers to Discharge: No Barriers Identified   Patient Goals and CMS Choice   CMS Medicare.gov Compare Post Acute Care list provided to:: Patient Choice offered to / list presented to : Patient      Discharge Placement                       Discharge Plan and Services Additional resources added to the After Visit Summary for                            [facility]: PT, OT [facility] Agency: [facility] Date [facility] Agency Contacted: 08/04/23   Representative spoke with at [facility] Agency: Randel Buss  Social Drivers of Health (SDOH) Interventions SDOH Screenings   Food Insecurity: No Food Insecurity (08/01/2023)  Housing: Unknown (08/01/2023)  Transportation Needs: No Transportation Needs (08/01/2023)  Utilities: Not At Risk (08/01/2023)  Depression (PHQ2-9): Low Risk  (09/24/2022)  Financial Resource Strain: Low Risk  (06/12/2021)  Physical Activity: Insufficiently Active (06/12/2021)  Social Connections: Moderately Isolated (08/01/2023)  Stress: Stress Concern Present (06/12/2021)  Tobacco Use: Low Risk  (07/31/2023)  Health Literacy: Adequate Health Literacy (09/24/2022)     Readmission Risk Interventions    08/03/2023    9:16 AM  Readmission Risk Prevention Plan  Transportation Screening Complete  PCP or Specialist Appt within 5-7 Days Complete  Home Care Screening Complete  Medication Review (RN CM) Complete
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
## 2023-08-04 NOTE — Plan of Care (Signed)

## 2023-08-04 NOTE — Care Management Important Message (Signed)
 Important Message  Patient Details  Name: [name]. MRN: [id number] Date of Birth: 15-Nov-1950   Important Message Given:  Yes - Medicare IM     Felix Host 08/04/2023, 3:21 PM

## 2023-08-05 NOTE — Transitions of Care (Post Inpatient/ED Visit) (Signed)
 08/05/2023  Patient ID: [name]., male   DOB: 09-15-1950, 73 y.o.   MRN: [id number]  Medication Review for transitions of care.  See [employer] for  documentation.  Lenia Housley J. Tamarick Kovalcik RN, MSN [facility], [facility] RN Care Manager Direct Dial: [phone]  Fax: [phone] Website: [email or web address]

## 2023-08-06 ENCOUNTER — Ambulatory Visit: Admitting: Family Medicine

## 2023-08-06 DIAGNOSIS — I480 Paroxysmal atrial fibrillation: Secondary | ICD-10-CM | POA: Diagnosis not present

## 2023-08-06 DIAGNOSIS — E11649 Type 2 diabetes mellitus with hypoglycemia without coma: Secondary | ICD-10-CM | POA: Diagnosis not present

## 2023-08-06 DIAGNOSIS — M109 Gout, unspecified: Secondary | ICD-10-CM | POA: Diagnosis not present

## 2023-08-06 DIAGNOSIS — Z7985 Long-term (current) use of injectable non-insulin antidiabetic drugs: Secondary | ICD-10-CM | POA: Diagnosis not present

## 2023-08-06 DIAGNOSIS — E1165 Type 2 diabetes mellitus with hyperglycemia: Secondary | ICD-10-CM | POA: Diagnosis not present

## 2023-08-06 DIAGNOSIS — M5416 Radiculopathy, lumbar region: Secondary | ICD-10-CM | POA: Diagnosis not present

## 2023-08-06 DIAGNOSIS — E669 Obesity, unspecified: Secondary | ICD-10-CM | POA: Diagnosis not present

## 2023-08-06 DIAGNOSIS — I700 Atherosclerosis of aorta: Secondary | ICD-10-CM | POA: Diagnosis not present

## 2023-08-06 DIAGNOSIS — K760 Fatty (change of) liver, not elsewhere classified: Secondary | ICD-10-CM | POA: Diagnosis not present

## 2023-08-06 DIAGNOSIS — Z7901 Long term (current) use of anticoagulants: Secondary | ICD-10-CM | POA: Diagnosis not present

## 2023-08-06 DIAGNOSIS — M19011 Primary osteoarthritis, right shoulder: Secondary | ICD-10-CM | POA: Diagnosis not present

## 2023-08-06 DIAGNOSIS — N189 Chronic kidney disease, unspecified: Secondary | ICD-10-CM | POA: Diagnosis not present

## 2023-08-06 DIAGNOSIS — G4700 Insomnia, unspecified: Secondary | ICD-10-CM | POA: Diagnosis not present

## 2023-08-06 DIAGNOSIS — K219 Gastro-esophageal reflux disease without esophagitis: Secondary | ICD-10-CM | POA: Diagnosis not present

## 2023-08-06 DIAGNOSIS — E1142 Type 2 diabetes mellitus with diabetic polyneuropathy: Secondary | ICD-10-CM | POA: Diagnosis not present

## 2023-08-06 DIAGNOSIS — E785 Hyperlipidemia, unspecified: Secondary | ICD-10-CM | POA: Diagnosis not present

## 2023-08-06 DIAGNOSIS — K766 Portal hypertension: Secondary | ICD-10-CM | POA: Diagnosis not present

## 2023-08-06 DIAGNOSIS — E1122 Type 2 diabetes mellitus with diabetic chronic kidney disease: Secondary | ICD-10-CM | POA: Diagnosis not present

## 2023-08-06 DIAGNOSIS — S12301D Unspecified nondisplaced fracture of fourth cervical vertebra, subsequent encounter for fracture with routine healing: Secondary | ICD-10-CM | POA: Diagnosis not present

## 2023-08-06 DIAGNOSIS — Z6833 Body mass index (BMI) 33.0-33.9, adult: Secondary | ICD-10-CM | POA: Diagnosis not present

## 2023-08-06 DIAGNOSIS — M160 Bilateral primary osteoarthritis of hip: Secondary | ICD-10-CM | POA: Diagnosis not present

## 2023-08-06 DIAGNOSIS — K7460 Unspecified cirrhosis of liver: Secondary | ICD-10-CM | POA: Diagnosis not present

## 2023-08-06 DIAGNOSIS — I129 Hypertensive chronic kidney disease with stage 1 through stage 4 chronic kidney disease, or unspecified chronic kidney disease: Secondary | ICD-10-CM | POA: Diagnosis not present

## 2023-08-06 DIAGNOSIS — Z950 Presence of cardiac pacemaker: Secondary | ICD-10-CM | POA: Diagnosis not present

## 2023-08-07 DIAGNOSIS — Z125 Encounter for screening for malignant neoplasm of prostate: Secondary | ICD-10-CM | POA: Diagnosis not present

## 2023-08-07 DIAGNOSIS — E291 Testicular hypofunction: Secondary | ICD-10-CM | POA: Diagnosis not present

## 2023-08-07 DIAGNOSIS — E1142 Type 2 diabetes mellitus with diabetic polyneuropathy: Secondary | ICD-10-CM | POA: Diagnosis not present

## 2023-08-10 ENCOUNTER — Telehealth: Payer: Self-pay | Admitting: *Deleted

## 2023-08-10 ENCOUNTER — Telehealth: Payer: Self-pay

## 2023-08-10 DIAGNOSIS — I480 Paroxysmal atrial fibrillation: Secondary | ICD-10-CM | POA: Diagnosis not present

## 2023-08-10 DIAGNOSIS — M109 Gout, unspecified: Secondary | ICD-10-CM | POA: Diagnosis not present

## 2023-08-10 DIAGNOSIS — Z7985 Long-term (current) use of injectable non-insulin antidiabetic drugs: Secondary | ICD-10-CM | POA: Diagnosis not present

## 2023-08-10 DIAGNOSIS — K760 Fatty (change of) liver, not elsewhere classified: Secondary | ICD-10-CM | POA: Diagnosis not present

## 2023-08-10 DIAGNOSIS — N189 Chronic kidney disease, unspecified: Secondary | ICD-10-CM | POA: Diagnosis not present

## 2023-08-10 DIAGNOSIS — E785 Hyperlipidemia, unspecified: Secondary | ICD-10-CM | POA: Diagnosis not present

## 2023-08-10 DIAGNOSIS — E1122 Type 2 diabetes mellitus with diabetic chronic kidney disease: Secondary | ICD-10-CM | POA: Diagnosis not present

## 2023-08-10 DIAGNOSIS — E118 Type 2 diabetes mellitus with unspecified complications: Secondary | ICD-10-CM

## 2023-08-10 DIAGNOSIS — Z950 Presence of cardiac pacemaker: Secondary | ICD-10-CM | POA: Diagnosis not present

## 2023-08-10 DIAGNOSIS — K7460 Unspecified cirrhosis of liver: Secondary | ICD-10-CM | POA: Diagnosis not present

## 2023-08-10 DIAGNOSIS — K766 Portal hypertension: Secondary | ICD-10-CM | POA: Diagnosis not present

## 2023-08-10 DIAGNOSIS — K219 Gastro-esophageal reflux disease without esophagitis: Secondary | ICD-10-CM | POA: Diagnosis not present

## 2023-08-10 DIAGNOSIS — G4700 Insomnia, unspecified: Secondary | ICD-10-CM | POA: Diagnosis not present

## 2023-08-10 DIAGNOSIS — E11649 Type 2 diabetes mellitus with hypoglycemia without coma: Secondary | ICD-10-CM | POA: Diagnosis not present

## 2023-08-10 DIAGNOSIS — E1165 Type 2 diabetes mellitus with hyperglycemia: Secondary | ICD-10-CM | POA: Diagnosis not present

## 2023-08-10 DIAGNOSIS — M19011 Primary osteoarthritis, right shoulder: Secondary | ICD-10-CM | POA: Diagnosis not present

## 2023-08-10 DIAGNOSIS — I700 Atherosclerosis of aorta: Secondary | ICD-10-CM | POA: Diagnosis not present

## 2023-08-10 DIAGNOSIS — E1142 Type 2 diabetes mellitus with diabetic polyneuropathy: Secondary | ICD-10-CM | POA: Diagnosis not present

## 2023-08-10 DIAGNOSIS — I10 Essential (primary) hypertension: Secondary | ICD-10-CM

## 2023-08-10 DIAGNOSIS — E669 Obesity, unspecified: Secondary | ICD-10-CM | POA: Diagnosis not present

## 2023-08-10 DIAGNOSIS — I129 Hypertensive chronic kidney disease with stage 1 through stage 4 chronic kidney disease, or unspecified chronic kidney disease: Secondary | ICD-10-CM | POA: Diagnosis not present

## 2023-08-10 DIAGNOSIS — M5416 Radiculopathy, lumbar region: Secondary | ICD-10-CM | POA: Diagnosis not present

## 2023-08-10 DIAGNOSIS — Z6833 Body mass index (BMI) 33.0-33.9, adult: Secondary | ICD-10-CM | POA: Diagnosis not present

## 2023-08-10 DIAGNOSIS — M160 Bilateral primary osteoarthritis of hip: Secondary | ICD-10-CM | POA: Diagnosis not present

## 2023-08-10 DIAGNOSIS — Z7901 Long term (current) use of anticoagulants: Secondary | ICD-10-CM | POA: Diagnosis not present

## 2023-08-10 DIAGNOSIS — S12301D Unspecified nondisplaced fracture of fourth cervical vertebra, subsequent encounter for fracture with routine healing: Secondary | ICD-10-CM | POA: Diagnosis not present

## 2023-08-10 NOTE — Progress Notes (Signed)
 Complex Care Management Note  Care Guide Note 08/10/2023 Name: [name]. MRN: [id number] DOB: 07-Jul-1950  [name]. is a 73 y.o. year old male who sees Pete Brand, [place] for primary care. I reached out to Mandy Second. by phone today to offer complex care management services.  [name] was given information about Complex Care Management services today including:   The Complex Care Management services include support from the care team which includes your Nurse Care Manager, Clinical Social Worker, or Pharmacist.  The Complex Care Management team is here to help remove barriers to the health concerns and goals most important to you. Complex Care Management services are voluntary, and the patient may decline or stop services at any time by request to their care team member.   Complex Care Management Consent Status: Patient agreed to services and verbal consent obtained.   Follow up plan:  Telephone appointment with complex care management team member scheduled for:  08/27/2023  Encounter Outcome:  Patient Scheduled  Kandis Ormond, CMA [place]  [facility], [facility] Guide Direct Dial: [phone]  Fax: [phone] Website: [email or web address]

## 2023-08-11 ENCOUNTER — Other Ambulatory Visit: Payer: Self-pay | Admitting: Family Medicine

## 2023-08-11 DIAGNOSIS — E11649 Type 2 diabetes mellitus with hypoglycemia without coma: Secondary | ICD-10-CM | POA: Diagnosis not present

## 2023-08-11 DIAGNOSIS — E538 Deficiency of other specified B group vitamins: Secondary | ICD-10-CM | POA: Diagnosis not present

## 2023-08-11 DIAGNOSIS — S12300D Unspecified displaced fracture of fourth cervical vertebra, subsequent encounter for fracture with routine healing: Secondary | ICD-10-CM | POA: Diagnosis not present

## 2023-08-11 DIAGNOSIS — E291 Testicular hypofunction: Secondary | ICD-10-CM | POA: Diagnosis not present

## 2023-08-11 DIAGNOSIS — Z09 Encounter for follow-up examination after completed treatment for conditions other than malignant neoplasm: Secondary | ICD-10-CM | POA: Diagnosis not present

## 2023-08-12 DIAGNOSIS — M5416 Radiculopathy, lumbar region: Secondary | ICD-10-CM | POA: Diagnosis not present

## 2023-08-12 DIAGNOSIS — N189 Chronic kidney disease, unspecified: Secondary | ICD-10-CM | POA: Diagnosis not present

## 2023-08-12 DIAGNOSIS — S129XXA Fracture of neck, unspecified, initial encounter: Secondary | ICD-10-CM | POA: Diagnosis not present

## 2023-08-13 DIAGNOSIS — S12301D Unspecified nondisplaced fracture of fourth cervical vertebra, subsequent encounter for fracture with routine healing: Secondary | ICD-10-CM | POA: Diagnosis not present

## 2023-08-13 DIAGNOSIS — E11649 Type 2 diabetes mellitus with hypoglycemia without coma: Secondary | ICD-10-CM | POA: Diagnosis not present

## 2023-08-13 DIAGNOSIS — Z7901 Long term (current) use of anticoagulants: Secondary | ICD-10-CM | POA: Diagnosis not present

## 2023-08-13 DIAGNOSIS — K219 Gastro-esophageal reflux disease without esophagitis: Secondary | ICD-10-CM | POA: Diagnosis not present

## 2023-08-13 DIAGNOSIS — K766 Portal hypertension: Secondary | ICD-10-CM | POA: Diagnosis not present

## 2023-08-13 DIAGNOSIS — K760 Fatty (change of) liver, not elsewhere classified: Secondary | ICD-10-CM | POA: Diagnosis not present

## 2023-08-13 DIAGNOSIS — I129 Hypertensive chronic kidney disease with stage 1 through stage 4 chronic kidney disease, or unspecified chronic kidney disease: Secondary | ICD-10-CM | POA: Diagnosis not present

## 2023-08-13 DIAGNOSIS — I700 Atherosclerosis of aorta: Secondary | ICD-10-CM | POA: Diagnosis not present

## 2023-08-13 DIAGNOSIS — E785 Hyperlipidemia, unspecified: Secondary | ICD-10-CM | POA: Diagnosis not present

## 2023-08-13 DIAGNOSIS — G4700 Insomnia, unspecified: Secondary | ICD-10-CM | POA: Diagnosis not present

## 2023-08-13 DIAGNOSIS — E1142 Type 2 diabetes mellitus with diabetic polyneuropathy: Secondary | ICD-10-CM | POA: Diagnosis not present

## 2023-08-13 DIAGNOSIS — M160 Bilateral primary osteoarthritis of hip: Secondary | ICD-10-CM | POA: Diagnosis not present

## 2023-08-13 DIAGNOSIS — Z7985 Long-term (current) use of injectable non-insulin antidiabetic drugs: Secondary | ICD-10-CM | POA: Diagnosis not present

## 2023-08-13 DIAGNOSIS — I480 Paroxysmal atrial fibrillation: Secondary | ICD-10-CM | POA: Diagnosis not present

## 2023-08-13 DIAGNOSIS — E1165 Type 2 diabetes mellitus with hyperglycemia: Secondary | ICD-10-CM | POA: Diagnosis not present

## 2023-08-13 DIAGNOSIS — E1122 Type 2 diabetes mellitus with diabetic chronic kidney disease: Secondary | ICD-10-CM | POA: Diagnosis not present

## 2023-08-13 DIAGNOSIS — M109 Gout, unspecified: Secondary | ICD-10-CM | POA: Diagnosis not present

## 2023-08-13 DIAGNOSIS — E669 Obesity, unspecified: Secondary | ICD-10-CM | POA: Diagnosis not present

## 2023-08-13 DIAGNOSIS — M19011 Primary osteoarthritis, right shoulder: Secondary | ICD-10-CM | POA: Diagnosis not present

## 2023-08-13 DIAGNOSIS — Z950 Presence of cardiac pacemaker: Secondary | ICD-10-CM | POA: Diagnosis not present

## 2023-08-13 DIAGNOSIS — Z6833 Body mass index (BMI) 33.0-33.9, adult: Secondary | ICD-10-CM | POA: Diagnosis not present

## 2023-08-13 DIAGNOSIS — K7460 Unspecified cirrhosis of liver: Secondary | ICD-10-CM | POA: Diagnosis not present

## 2023-08-13 DIAGNOSIS — M5416 Radiculopathy, lumbar region: Secondary | ICD-10-CM | POA: Diagnosis not present

## 2023-08-13 DIAGNOSIS — N189 Chronic kidney disease, unspecified: Secondary | ICD-10-CM | POA: Diagnosis not present

## 2023-08-14 DIAGNOSIS — S12301D Unspecified nondisplaced fracture of fourth cervical vertebra, subsequent encounter for fracture with routine healing: Secondary | ICD-10-CM | POA: Diagnosis not present

## 2023-08-17 ENCOUNTER — Ambulatory Visit: Attending: Cardiology | Admitting: Cardiology

## 2023-08-17 ENCOUNTER — Encounter: Payer: Self-pay | Admitting: Cardiology

## 2023-08-17 VITALS — BP 110/56 | HR 92 | Ht 69.0 in | Wt 225.0 lb

## 2023-08-17 DIAGNOSIS — I480 Paroxysmal atrial fibrillation: Secondary | ICD-10-CM

## 2023-08-17 DIAGNOSIS — E1165 Type 2 diabetes mellitus with hyperglycemia: Secondary | ICD-10-CM | POA: Diagnosis not present

## 2023-08-17 DIAGNOSIS — I10 Essential (primary) hypertension: Secondary | ICD-10-CM

## 2023-08-17 DIAGNOSIS — I441 Atrioventricular block, second degree: Secondary | ICD-10-CM

## 2023-08-17 DIAGNOSIS — E11649 Type 2 diabetes mellitus with hypoglycemia without coma: Secondary | ICD-10-CM | POA: Diagnosis not present

## 2023-08-17 DIAGNOSIS — D6869 Other thrombophilia: Secondary | ICD-10-CM | POA: Diagnosis not present

## 2023-08-17 DIAGNOSIS — S12301A Unspecified nondisplaced fracture of fourth cervical vertebra, initial encounter for closed fracture: Secondary | ICD-10-CM | POA: Diagnosis not present

## 2023-08-17 DIAGNOSIS — E1143 Type 2 diabetes mellitus with diabetic autonomic (poly)neuropathy: Secondary | ICD-10-CM | POA: Diagnosis not present
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
## 2023-08-19 DIAGNOSIS — I129 Hypertensive chronic kidney disease with stage 1 through stage 4 chronic kidney disease, or unspecified chronic kidney disease: Secondary | ICD-10-CM | POA: Diagnosis not present

## 2023-08-19 DIAGNOSIS — N189 Chronic kidney disease, unspecified: Secondary | ICD-10-CM | POA: Diagnosis not present

## 2023-08-19 DIAGNOSIS — E1122 Type 2 diabetes mellitus with diabetic chronic kidney disease: Secondary | ICD-10-CM | POA: Diagnosis not present

## 2023-08-19 DIAGNOSIS — K766 Portal hypertension: Secondary | ICD-10-CM | POA: Diagnosis not present

## 2023-08-19 DIAGNOSIS — E669 Obesity, unspecified: Secondary | ICD-10-CM | POA: Diagnosis not present

## 2023-08-19 DIAGNOSIS — M19011 Primary osteoarthritis, right shoulder: Secondary | ICD-10-CM | POA: Diagnosis not present

## 2023-08-19 DIAGNOSIS — Z6833 Body mass index (BMI) 33.0-33.9, adult: Secondary | ICD-10-CM | POA: Diagnosis not present

## 2023-08-19 DIAGNOSIS — E1142 Type 2 diabetes mellitus with diabetic polyneuropathy: Secondary | ICD-10-CM | POA: Diagnosis not present

## 2023-08-19 DIAGNOSIS — Z7901 Long term (current) use of anticoagulants: Secondary | ICD-10-CM | POA: Diagnosis not present

## 2023-08-19 DIAGNOSIS — M5416 Radiculopathy, lumbar region: Secondary | ICD-10-CM | POA: Diagnosis not present

## 2023-08-19 DIAGNOSIS — M160 Bilateral primary osteoarthritis of hip: Secondary | ICD-10-CM | POA: Diagnosis not present

## 2023-08-19 DIAGNOSIS — S12301D Unspecified nondisplaced fracture of fourth cervical vertebra, subsequent encounter for fracture with routine healing: Secondary | ICD-10-CM | POA: Diagnosis not present

## 2023-08-19 DIAGNOSIS — K219 Gastro-esophageal reflux disease without esophagitis: Secondary | ICD-10-CM | POA: Diagnosis not present

## 2023-08-19 DIAGNOSIS — K760 Fatty (change of) liver, not elsewhere classified: Secondary | ICD-10-CM | POA: Diagnosis not present

## 2023-08-19 DIAGNOSIS — I480 Paroxysmal atrial fibrillation: Secondary | ICD-10-CM | POA: Diagnosis not present

## 2023-08-19 DIAGNOSIS — M109 Gout, unspecified: Secondary | ICD-10-CM | POA: Diagnosis not present

## 2023-08-19 DIAGNOSIS — I700 Atherosclerosis of aorta: Secondary | ICD-10-CM | POA: Diagnosis not present

## 2023-08-19 DIAGNOSIS — E785 Hyperlipidemia, unspecified: Secondary | ICD-10-CM | POA: Diagnosis not present

## 2023-08-19 DIAGNOSIS — Z950 Presence of cardiac pacemaker: Secondary | ICD-10-CM | POA: Diagnosis not present

## 2023-08-19 DIAGNOSIS — K7460 Unspecified cirrhosis of liver: Secondary | ICD-10-CM | POA: Diagnosis not present

## 2023-08-19 DIAGNOSIS — G4700 Insomnia, unspecified: Secondary | ICD-10-CM | POA: Diagnosis not present

## 2023-08-19 DIAGNOSIS — E11649 Type 2 diabetes mellitus with hypoglycemia without coma: Secondary | ICD-10-CM | POA: Diagnosis not present

## 2023-08-19 DIAGNOSIS — Z7985 Long-term (current) use of injectable non-insulin antidiabetic drugs: Secondary | ICD-10-CM | POA: Diagnosis not present

## 2023-08-19 DIAGNOSIS — E1165 Type 2 diabetes mellitus with hyperglycemia: Secondary | ICD-10-CM | POA: Diagnosis not present

## 2023-08-20 DIAGNOSIS — Z7901 Long term (current) use of anticoagulants: Secondary | ICD-10-CM | POA: Diagnosis not present

## 2023-08-20 DIAGNOSIS — N189 Chronic kidney disease, unspecified: Secondary | ICD-10-CM | POA: Diagnosis not present

## 2023-08-20 DIAGNOSIS — Z6833 Body mass index (BMI) 33.0-33.9, adult: Secondary | ICD-10-CM | POA: Diagnosis not present

## 2023-08-20 DIAGNOSIS — K766 Portal hypertension: Secondary | ICD-10-CM | POA: Diagnosis not present

## 2023-08-20 DIAGNOSIS — K219 Gastro-esophageal reflux disease without esophagitis: Secondary | ICD-10-CM | POA: Diagnosis not present

## 2023-08-20 DIAGNOSIS — M19011 Primary osteoarthritis, right shoulder: Secondary | ICD-10-CM | POA: Diagnosis not present

## 2023-08-20 DIAGNOSIS — S12301D Unspecified nondisplaced fracture of fourth cervical vertebra, subsequent encounter for fracture with routine healing: Secondary | ICD-10-CM | POA: Diagnosis not present

## 2023-08-20 DIAGNOSIS — I129 Hypertensive chronic kidney disease with stage 1 through stage 4 chronic kidney disease, or unspecified chronic kidney disease: Secondary | ICD-10-CM | POA: Diagnosis not present

## 2023-08-20 DIAGNOSIS — M5416 Radiculopathy, lumbar region: Secondary | ICD-10-CM | POA: Diagnosis not present

## 2023-08-20 DIAGNOSIS — K7460 Unspecified cirrhosis of liver: Secondary | ICD-10-CM | POA: Diagnosis not present

## 2023-08-20 DIAGNOSIS — E1142 Type 2 diabetes mellitus with diabetic polyneuropathy: Secondary | ICD-10-CM | POA: Diagnosis not present

## 2023-08-20 DIAGNOSIS — I480 Paroxysmal atrial fibrillation: Secondary | ICD-10-CM | POA: Diagnosis not present

## 2023-08-20 DIAGNOSIS — M160 Bilateral primary osteoarthritis of hip: Secondary | ICD-10-CM | POA: Diagnosis not present

## 2023-08-20 DIAGNOSIS — E669 Obesity, unspecified: Secondary | ICD-10-CM | POA: Diagnosis not present

## 2023-08-20 DIAGNOSIS — Z7985 Long-term (current) use of injectable non-insulin antidiabetic drugs: Secondary | ICD-10-CM | POA: Diagnosis not present

## 2023-08-20 DIAGNOSIS — Z950 Presence of cardiac pacemaker: Secondary | ICD-10-CM | POA: Diagnosis not present

## 2023-08-20 DIAGNOSIS — E1122 Type 2 diabetes mellitus with diabetic chronic kidney disease: Secondary | ICD-10-CM | POA: Diagnosis not present

## 2023-08-20 DIAGNOSIS — E1165 Type 2 diabetes mellitus with hyperglycemia: Secondary | ICD-10-CM | POA: Diagnosis not present

## 2023-08-20 DIAGNOSIS — K760 Fatty (change of) liver, not elsewhere classified: Secondary | ICD-10-CM | POA: Diagnosis not present

## 2023-08-20 DIAGNOSIS — E11649 Type 2 diabetes mellitus with hypoglycemia without coma: Secondary | ICD-10-CM | POA: Diagnosis not present

## 2023-08-20 DIAGNOSIS — M109 Gout, unspecified: Secondary | ICD-10-CM | POA: Diagnosis not present

## 2023-08-20 DIAGNOSIS — E785 Hyperlipidemia, unspecified: Secondary | ICD-10-CM | POA: Diagnosis not present

## 2023-08-20 DIAGNOSIS — I700 Atherosclerosis of aorta: Secondary | ICD-10-CM | POA: Diagnosis not present

## 2023-08-20 DIAGNOSIS — G4700 Insomnia, unspecified: Secondary | ICD-10-CM | POA: Diagnosis not present

## 2023-08-22 DIAGNOSIS — J029 Acute pharyngitis, unspecified: Secondary | ICD-10-CM | POA: Diagnosis not present

## 2023-08-25 DIAGNOSIS — E1142 Type 2 diabetes mellitus with diabetic polyneuropathy: Secondary | ICD-10-CM | POA: Diagnosis not present

## 2023-08-25 DIAGNOSIS — Z125 Encounter for screening for malignant neoplasm of prostate: Secondary | ICD-10-CM | POA: Diagnosis not present

## 2023-08-25 DIAGNOSIS — E538 Deficiency of other specified B group vitamins: Secondary | ICD-10-CM | POA: Diagnosis not present

## 2023-08-25 DIAGNOSIS — E291 Testicular hypofunction: Secondary | ICD-10-CM | POA: Diagnosis not present

## 2023-08-26 DIAGNOSIS — E1165 Type 2 diabetes mellitus with hyperglycemia: Secondary | ICD-10-CM | POA: Diagnosis not present

## 2023-08-26 DIAGNOSIS — E1122 Type 2 diabetes mellitus with diabetic chronic kidney disease: Secondary | ICD-10-CM | POA: Diagnosis not present

## 2023-08-26 DIAGNOSIS — E669 Obesity, unspecified: Secondary | ICD-10-CM | POA: Diagnosis not present

## 2023-08-26 DIAGNOSIS — G4700 Insomnia, unspecified: Secondary | ICD-10-CM | POA: Diagnosis not present

## 2023-08-26 DIAGNOSIS — E1142 Type 2 diabetes mellitus with diabetic polyneuropathy: Secondary | ICD-10-CM | POA: Diagnosis not present

## 2023-08-26 DIAGNOSIS — E785 Hyperlipidemia, unspecified: Secondary | ICD-10-CM | POA: Diagnosis not present

## 2023-08-26 DIAGNOSIS — M5416 Radiculopathy, lumbar region: Secondary | ICD-10-CM | POA: Diagnosis not present

## 2023-08-26 DIAGNOSIS — K219 Gastro-esophageal reflux disease without esophagitis: Secondary | ICD-10-CM | POA: Diagnosis not present

## 2023-08-26 DIAGNOSIS — K766 Portal hypertension: Secondary | ICD-10-CM | POA: Diagnosis not present

## 2023-08-26 DIAGNOSIS — Z950 Presence of cardiac pacemaker: Secondary | ICD-10-CM | POA: Diagnosis not present

## 2023-08-26 DIAGNOSIS — I700 Atherosclerosis of aorta: Secondary | ICD-10-CM | POA: Diagnosis not present

## 2023-08-26 DIAGNOSIS — Z7985 Long-term (current) use of injectable non-insulin antidiabetic drugs: Secondary | ICD-10-CM | POA: Diagnosis not present

## 2023-08-26 DIAGNOSIS — K760 Fatty (change of) liver, not elsewhere classified: Secondary | ICD-10-CM | POA: Diagnosis not present

## 2023-08-26 DIAGNOSIS — M19011 Primary osteoarthritis, right shoulder: Secondary | ICD-10-CM | POA: Diagnosis not present

## 2023-08-26 DIAGNOSIS — Z7901 Long term (current) use of anticoagulants: Secondary | ICD-10-CM | POA: Diagnosis not present

## 2023-08-26 DIAGNOSIS — S12301D Unspecified nondisplaced fracture of fourth cervical vertebra, subsequent encounter for fracture with routine healing: Secondary | ICD-10-CM | POA: Diagnosis not present

## 2023-08-26 DIAGNOSIS — Z6833 Body mass index (BMI) 33.0-33.9, adult: Secondary | ICD-10-CM | POA: Diagnosis not present

## 2023-08-26 DIAGNOSIS — I129 Hypertensive chronic kidney disease with stage 1 through stage 4 chronic kidney disease, or unspecified chronic kidney disease: Secondary | ICD-10-CM | POA: Diagnosis not present

## 2023-08-26 DIAGNOSIS — I480 Paroxysmal atrial fibrillation: Secondary | ICD-10-CM | POA: Diagnosis not present

## 2023-08-26 DIAGNOSIS — N189 Chronic kidney disease, unspecified: Secondary | ICD-10-CM | POA: Diagnosis not present

## 2023-08-26 DIAGNOSIS — M160 Bilateral primary osteoarthritis of hip: Secondary | ICD-10-CM | POA: Diagnosis not present

## 2023-08-26 DIAGNOSIS — K7460 Unspecified cirrhosis of liver: Secondary | ICD-10-CM | POA: Diagnosis not present

## 2023-08-26 DIAGNOSIS — M109 Gout, unspecified: Secondary | ICD-10-CM | POA: Diagnosis not present

## 2023-08-26 DIAGNOSIS — E11649 Type 2 diabetes mellitus with hypoglycemia without coma: Secondary | ICD-10-CM | POA: Diagnosis not present

## 2023-08-26 NOTE — Progress Notes (Signed)
 Remote pacemaker transmission.

## 2023-08-27 ENCOUNTER — Other Ambulatory Visit: Payer: Self-pay | Admitting: *Deleted

## 2023-08-27 NOTE — Patient Outreach (Signed)
 Emmi referral received. Patient contacted to complete assessment for care needs. Patient reports feeling much better both mentally and physically and declined full assessment at this time. Patient encouraged to contact this social worker if any mental health/community resource needs arise.   Karess Harner, LCSW [place]  [facility], [facility] Licensed Clinical Social Worker  Direct Dial: [phone]

## 2023-08-28 DIAGNOSIS — G4700 Insomnia, unspecified: Secondary | ICD-10-CM | POA: Diagnosis not present

## 2023-08-28 DIAGNOSIS — M109 Gout, unspecified: Secondary | ICD-10-CM | POA: Diagnosis not present

## 2023-08-28 DIAGNOSIS — K219 Gastro-esophageal reflux disease without esophagitis: Secondary | ICD-10-CM | POA: Diagnosis not present

## 2023-08-28 DIAGNOSIS — S12301D Unspecified nondisplaced fracture of fourth cervical vertebra, subsequent encounter for fracture with routine healing: Secondary | ICD-10-CM | POA: Diagnosis not present

## 2023-08-28 DIAGNOSIS — E669 Obesity, unspecified: Secondary | ICD-10-CM | POA: Diagnosis not present

## 2023-08-28 DIAGNOSIS — M19011 Primary osteoarthritis, right shoulder: Secondary | ICD-10-CM | POA: Diagnosis not present

## 2023-08-28 DIAGNOSIS — N189 Chronic kidney disease, unspecified: Secondary | ICD-10-CM | POA: Diagnosis not present

## 2023-08-28 DIAGNOSIS — E1122 Type 2 diabetes mellitus with diabetic chronic kidney disease: Secondary | ICD-10-CM | POA: Diagnosis not present

## 2023-08-28 DIAGNOSIS — M5416 Radiculopathy, lumbar region: Secondary | ICD-10-CM | POA: Diagnosis not present

## 2023-08-28 DIAGNOSIS — Z6833 Body mass index (BMI) 33.0-33.9, adult: Secondary | ICD-10-CM | POA: Diagnosis not present

## 2023-08-28 DIAGNOSIS — M160 Bilateral primary osteoarthritis of hip: Secondary | ICD-10-CM | POA: Diagnosis not present

## 2023-08-28 DIAGNOSIS — K760 Fatty (change of) liver, not elsewhere classified: Secondary | ICD-10-CM | POA: Diagnosis not present

## 2023-08-28 DIAGNOSIS — I480 Paroxysmal atrial fibrillation: Secondary | ICD-10-CM | POA: Diagnosis not present

## 2023-08-28 DIAGNOSIS — Z950 Presence of cardiac pacemaker: Secondary | ICD-10-CM | POA: Diagnosis not present

## 2023-08-28 DIAGNOSIS — E11649 Type 2 diabetes mellitus with hypoglycemia without coma: Secondary | ICD-10-CM | POA: Diagnosis not present

## 2023-08-28 DIAGNOSIS — Z7985 Long-term (current) use of injectable non-insulin antidiabetic drugs: Secondary | ICD-10-CM | POA: Diagnosis not present

## 2023-08-28 DIAGNOSIS — Z7901 Long term (current) use of anticoagulants: Secondary | ICD-10-CM | POA: Diagnosis not present

## 2023-08-28 DIAGNOSIS — E1165 Type 2 diabetes mellitus with hyperglycemia: Secondary | ICD-10-CM | POA: Diagnosis not present

## 2023-08-28 DIAGNOSIS — I700 Atherosclerosis of aorta: Secondary | ICD-10-CM | POA: Diagnosis not present

## 2023-08-28 DIAGNOSIS — E1142 Type 2 diabetes mellitus with diabetic polyneuropathy: Secondary | ICD-10-CM | POA: Diagnosis not present

## 2023-08-28 DIAGNOSIS — E785 Hyperlipidemia, unspecified: Secondary | ICD-10-CM | POA: Diagnosis not present

## 2023-08-28 DIAGNOSIS — I129 Hypertensive chronic kidney disease with stage 1 through stage 4 chronic kidney disease, or unspecified chronic kidney disease: Secondary | ICD-10-CM | POA: Diagnosis not present

## 2023-08-28 DIAGNOSIS — K766 Portal hypertension: Secondary | ICD-10-CM | POA: Diagnosis not present

## 2023-08-28 DIAGNOSIS — K7460 Unspecified cirrhosis of liver: Secondary | ICD-10-CM | POA: Diagnosis not present

## 2023-08-31 DIAGNOSIS — M109 Gout, unspecified: Secondary | ICD-10-CM | POA: Diagnosis not present

## 2023-08-31 DIAGNOSIS — E785 Hyperlipidemia, unspecified: Secondary | ICD-10-CM | POA: Diagnosis not present

## 2023-08-31 DIAGNOSIS — N189 Chronic kidney disease, unspecified: Secondary | ICD-10-CM | POA: Diagnosis not present

## 2023-08-31 DIAGNOSIS — K766 Portal hypertension: Secondary | ICD-10-CM | POA: Diagnosis not present

## 2023-08-31 DIAGNOSIS — M19011 Primary osteoarthritis, right shoulder: Secondary | ICD-10-CM | POA: Diagnosis not present

## 2023-08-31 DIAGNOSIS — K760 Fatty (change of) liver, not elsewhere classified: Secondary | ICD-10-CM | POA: Diagnosis not present

## 2023-08-31 DIAGNOSIS — E669 Obesity, unspecified: Secondary | ICD-10-CM | POA: Diagnosis not present

## 2023-08-31 DIAGNOSIS — I700 Atherosclerosis of aorta: Secondary | ICD-10-CM | POA: Diagnosis not present

## 2023-08-31 DIAGNOSIS — E1142 Type 2 diabetes mellitus with diabetic polyneuropathy: Secondary | ICD-10-CM | POA: Diagnosis not present

## 2023-08-31 DIAGNOSIS — E11649 Type 2 diabetes mellitus with hypoglycemia without coma: Secondary | ICD-10-CM | POA: Diagnosis not present

## 2023-08-31 DIAGNOSIS — G4700 Insomnia, unspecified: Secondary | ICD-10-CM | POA: Diagnosis not present

## 2023-08-31 DIAGNOSIS — Z6833 Body mass index (BMI) 33.0-33.9, adult: Secondary | ICD-10-CM | POA: Diagnosis not present

## 2023-08-31 DIAGNOSIS — K7460 Unspecified cirrhosis of liver: Secondary | ICD-10-CM | POA: Diagnosis not present

## 2023-08-31 DIAGNOSIS — M5416 Radiculopathy, lumbar region: Secondary | ICD-10-CM | POA: Diagnosis not present

## 2023-08-31 DIAGNOSIS — Z7985 Long-term (current) use of injectable non-insulin antidiabetic drugs: Secondary | ICD-10-CM | POA: Diagnosis not present

## 2023-08-31 DIAGNOSIS — I480 Paroxysmal atrial fibrillation: Secondary | ICD-10-CM | POA: Diagnosis not present

## 2023-08-31 DIAGNOSIS — Z950 Presence of cardiac pacemaker: Secondary | ICD-10-CM | POA: Diagnosis not present

## 2023-08-31 DIAGNOSIS — M160 Bilateral primary osteoarthritis of hip: Secondary | ICD-10-CM | POA: Diagnosis not present

## 2023-08-31 DIAGNOSIS — E1122 Type 2 diabetes mellitus with diabetic chronic kidney disease: Secondary | ICD-10-CM | POA: Diagnosis not present

## 2023-08-31 DIAGNOSIS — Z7901 Long term (current) use of anticoagulants: Secondary | ICD-10-CM | POA: Diagnosis not present

## 2023-08-31 DIAGNOSIS — E1165 Type 2 diabetes mellitus with hyperglycemia: Secondary | ICD-10-CM | POA: Diagnosis not present

## 2023-08-31 DIAGNOSIS — I129 Hypertensive chronic kidney disease with stage 1 through stage 4 chronic kidney disease, or unspecified chronic kidney disease: Secondary | ICD-10-CM | POA: Diagnosis not present

## 2023-08-31 DIAGNOSIS — S12301D Unspecified nondisplaced fracture of fourth cervical vertebra, subsequent encounter for fracture with routine healing: Secondary | ICD-10-CM | POA: Diagnosis not present

## 2023-08-31 DIAGNOSIS — K219 Gastro-esophageal reflux disease without esophagitis: Secondary | ICD-10-CM | POA: Diagnosis not present

## 2023-09-03 DIAGNOSIS — Z6833 Body mass index (BMI) 33.0-33.9, adult: Secondary | ICD-10-CM | POA: Diagnosis not present

## 2023-09-03 DIAGNOSIS — K760 Fatty (change of) liver, not elsewhere classified: Secondary | ICD-10-CM | POA: Diagnosis not present

## 2023-09-03 DIAGNOSIS — Z950 Presence of cardiac pacemaker: Secondary | ICD-10-CM | POA: Diagnosis not present

## 2023-09-03 DIAGNOSIS — E1122 Type 2 diabetes mellitus with diabetic chronic kidney disease: Secondary | ICD-10-CM | POA: Diagnosis not present

## 2023-09-03 DIAGNOSIS — Z7985 Long-term (current) use of injectable non-insulin antidiabetic drugs: Secondary | ICD-10-CM | POA: Diagnosis not present

## 2023-09-03 DIAGNOSIS — E1142 Type 2 diabetes mellitus with diabetic polyneuropathy: Secondary | ICD-10-CM | POA: Diagnosis not present

## 2023-09-03 DIAGNOSIS — I700 Atherosclerosis of aorta: Secondary | ICD-10-CM | POA: Diagnosis not present

## 2023-09-03 DIAGNOSIS — Z7901 Long term (current) use of anticoagulants: Secondary | ICD-10-CM | POA: Diagnosis not present

## 2023-09-03 DIAGNOSIS — M160 Bilateral primary osteoarthritis of hip: Secondary | ICD-10-CM | POA: Diagnosis not present

## 2023-09-03 DIAGNOSIS — M5416 Radiculopathy, lumbar region: Secondary | ICD-10-CM | POA: Diagnosis not present

## 2023-09-03 DIAGNOSIS — N189 Chronic kidney disease, unspecified: Secondary | ICD-10-CM | POA: Diagnosis not present

## 2023-09-03 DIAGNOSIS — I129 Hypertensive chronic kidney disease with stage 1 through stage 4 chronic kidney disease, or unspecified chronic kidney disease: Secondary | ICD-10-CM | POA: Diagnosis not present

## 2023-09-03 DIAGNOSIS — M109 Gout, unspecified: Secondary | ICD-10-CM | POA: Diagnosis not present

## 2023-09-03 DIAGNOSIS — K766 Portal hypertension: Secondary | ICD-10-CM | POA: Diagnosis not present

## 2023-09-03 DIAGNOSIS — K219 Gastro-esophageal reflux disease without esophagitis: Secondary | ICD-10-CM | POA: Diagnosis not present

## 2023-09-03 DIAGNOSIS — E669 Obesity, unspecified: Secondary | ICD-10-CM | POA: Diagnosis not present

## 2023-09-03 DIAGNOSIS — I480 Paroxysmal atrial fibrillation: Secondary | ICD-10-CM | POA: Diagnosis not present

## 2023-09-03 DIAGNOSIS — G4700 Insomnia, unspecified: Secondary | ICD-10-CM | POA: Diagnosis not present

## 2023-09-03 DIAGNOSIS — S12301D Unspecified nondisplaced fracture of fourth cervical vertebra, subsequent encounter for fracture with routine healing: Secondary | ICD-10-CM | POA: Diagnosis not present

## 2023-09-03 DIAGNOSIS — E11649 Type 2 diabetes mellitus with hypoglycemia without coma: Secondary | ICD-10-CM | POA: Diagnosis not present

## 2023-09-03 DIAGNOSIS — K7460 Unspecified cirrhosis of liver: Secondary | ICD-10-CM | POA: Diagnosis not present

## 2023-09-03 DIAGNOSIS — E785 Hyperlipidemia, unspecified: Secondary | ICD-10-CM | POA: Diagnosis not present

## 2023-09-03 DIAGNOSIS — E1165 Type 2 diabetes mellitus with hyperglycemia: Secondary | ICD-10-CM | POA: Diagnosis not present

## 2023-09-03 DIAGNOSIS — M19011 Primary osteoarthritis, right shoulder: Secondary | ICD-10-CM | POA: Diagnosis not present

## 2023-09-10 ENCOUNTER — Other Ambulatory Visit: Payer: Self-pay | Admitting: Family Medicine

## 2023-09-10 DIAGNOSIS — E538 Deficiency of other specified B group vitamins: Secondary | ICD-10-CM | POA: Diagnosis not present

## 2023-09-14 DIAGNOSIS — S12301D Unspecified nondisplaced fracture of fourth cervical vertebra, subsequent encounter for fracture with routine healing: Secondary | ICD-10-CM | POA: Diagnosis not present

## 2023-09-14 DIAGNOSIS — Z6831 Body mass index (BMI) 31.0-31.9, adult: Secondary | ICD-10-CM | POA: Diagnosis not present

## 2023-09-15 DIAGNOSIS — K219 Gastro-esophageal reflux disease without esophagitis: Secondary | ICD-10-CM | POA: Diagnosis not present

## 2023-09-15 DIAGNOSIS — I480 Paroxysmal atrial fibrillation: Secondary | ICD-10-CM | POA: Diagnosis not present

## 2023-09-15 DIAGNOSIS — K7460 Unspecified cirrhosis of liver: Secondary | ICD-10-CM | POA: Diagnosis not present

## 2023-09-15 DIAGNOSIS — I700 Atherosclerosis of aorta: Secondary | ICD-10-CM | POA: Diagnosis not present

## 2023-09-15 DIAGNOSIS — Z950 Presence of cardiac pacemaker: Secondary | ICD-10-CM | POA: Diagnosis not present

## 2023-09-15 DIAGNOSIS — N189 Chronic kidney disease, unspecified: Secondary | ICD-10-CM | POA: Diagnosis not present

## 2023-09-15 DIAGNOSIS — M160 Bilateral primary osteoarthritis of hip: Secondary | ICD-10-CM | POA: Diagnosis not present

## 2023-09-15 DIAGNOSIS — Z7901 Long term (current) use of anticoagulants: Secondary | ICD-10-CM | POA: Diagnosis not present

## 2023-09-15 DIAGNOSIS — E1142 Type 2 diabetes mellitus with diabetic polyneuropathy: Secondary | ICD-10-CM | POA: Diagnosis not present

## 2023-09-15 DIAGNOSIS — Z7985 Long-term (current) use of injectable non-insulin antidiabetic drugs: Secondary | ICD-10-CM | POA: Diagnosis not present

## 2023-09-15 DIAGNOSIS — Z6833 Body mass index (BMI) 33.0-33.9, adult: Secondary | ICD-10-CM | POA: Diagnosis not present

## 2023-09-15 DIAGNOSIS — E669 Obesity, unspecified: Secondary | ICD-10-CM | POA: Diagnosis not present

## 2023-09-15 DIAGNOSIS — E785 Hyperlipidemia, unspecified: Secondary | ICD-10-CM | POA: Diagnosis not present

## 2023-09-15 DIAGNOSIS — K766 Portal hypertension: Secondary | ICD-10-CM | POA: Diagnosis not present

## 2023-09-15 DIAGNOSIS — G4700 Insomnia, unspecified: Secondary | ICD-10-CM | POA: Diagnosis not present

## 2023-09-15 DIAGNOSIS — M5416 Radiculopathy, lumbar region: Secondary | ICD-10-CM | POA: Diagnosis not present

## 2023-09-15 DIAGNOSIS — E1122 Type 2 diabetes mellitus with diabetic chronic kidney disease: Secondary | ICD-10-CM | POA: Diagnosis not present

## 2023-09-15 DIAGNOSIS — M19011 Primary osteoarthritis, right shoulder: Secondary | ICD-10-CM | POA: Diagnosis not present

## 2023-09-15 DIAGNOSIS — M109 Gout, unspecified: Secondary | ICD-10-CM | POA: Diagnosis not present

## 2023-09-15 DIAGNOSIS — E11649 Type 2 diabetes mellitus with hypoglycemia without coma: Secondary | ICD-10-CM | POA: Diagnosis not present

## 2023-09-15 DIAGNOSIS — I129 Hypertensive chronic kidney disease with stage 1 through stage 4 chronic kidney disease, or unspecified chronic kidney disease: Secondary | ICD-10-CM | POA: Diagnosis not present

## 2023-09-15 DIAGNOSIS — E1165 Type 2 diabetes mellitus with hyperglycemia: Secondary | ICD-10-CM | POA: Diagnosis not present

## 2023-09-15 DIAGNOSIS — S12301D Unspecified nondisplaced fracture of fourth cervical vertebra, subsequent encounter for fracture with routine healing: Secondary | ICD-10-CM | POA: Diagnosis not present

## 2023-09-15 DIAGNOSIS — K760 Fatty (change of) liver, not elsewhere classified: Secondary | ICD-10-CM | POA: Diagnosis not present

## 2023-09-23 ENCOUNTER — Ambulatory Visit

## 2023-09-23 DIAGNOSIS — E1122 Type 2 diabetes mellitus with diabetic chronic kidney disease: Secondary | ICD-10-CM | POA: Diagnosis not present

## 2023-09-23 DIAGNOSIS — N1831 Chronic kidney disease, stage 3a: Secondary | ICD-10-CM | POA: Diagnosis not present

## 2023-09-23 DIAGNOSIS — N179 Acute kidney failure, unspecified: Secondary | ICD-10-CM | POA: Diagnosis not present

## 2023-09-23 DIAGNOSIS — E291 Testicular hypofunction: Secondary | ICD-10-CM | POA: Diagnosis not present

## 2023-09-23 DIAGNOSIS — N182 Chronic kidney disease, stage 2 (mild): Secondary | ICD-10-CM | POA: Diagnosis not present

## 2023-09-23 DIAGNOSIS — I129 Hypertensive chronic kidney disease with stage 1 through stage 4 chronic kidney disease, or unspecified chronic kidney disease: Secondary | ICD-10-CM | POA: Diagnosis not present

## 2023-09-24 ENCOUNTER — Ambulatory Visit: Payer: Self-pay | Admitting: Cardiology

## 2023-09-24 LAB — CUP PACEART REMOTE DEVICE CHECK
Battery Remaining Longevity: 94 mo
Battery Voltage: 3 V
Brady Statistic AP VP Percent: 0.53 %
Brady Statistic AP VS Percent: 0 %
Brady Statistic AS VP Percent: 98.42 %
Brady Statistic AS VS Percent: 1.03 %
Brady Statistic RA Percent Paced: 0.57 %
Brady Statistic RV Percent Paced: 98.59 %
Date Time Interrogation Session: 20250715203446
Implantable Lead Connection Status: 753985
Implantable Lead Connection Status: 753985
Implantable Lead Implant Date: 20230202
Implantable Lead Implant Date: 20230202
Implantable Lead Location: 753859
Implantable Lead Location: 753860
Implantable Lead Model: 3830
Implantable Lead Model: 5076
Implantable Pulse Generator Implant Date: 20230202
Lead Channel Impedance Value: 304 Ohm
Lead Channel Impedance Value: 342 Ohm
Lead Channel Impedance Value: 418 Ohm
Lead Channel Impedance Value: 456 Ohm
Lead Channel Pacing Threshold Amplitude: 0.5 V
Lead Channel Pacing Threshold Amplitude: 1.25 V
Lead Channel Pacing Threshold Pulse Width: 0.4 ms
Lead Channel Pacing Threshold Pulse Width: 0.4 ms
Lead Channel Sensing Intrinsic Amplitude: 4 mV
Lead Channel Sensing Intrinsic Amplitude: 4 mV
Lead Channel Sensing Intrinsic Amplitude: 4 mV
Lead Channel Sensing Intrinsic Amplitude: 4 mV
Lead Channel Setting Pacing Amplitude: 1.5 V
Lead Channel Setting Pacing Amplitude: 2.75 V
Lead Channel Setting Pacing Pulse Width: 0.4 ms
Lead Channel Setting Sensing Sensitivity: 1.2 mV
Zone Setting Status: 755011

## 2023-10-02 DIAGNOSIS — E785 Hyperlipidemia, unspecified: Secondary | ICD-10-CM | POA: Diagnosis not present

## 2023-10-02 DIAGNOSIS — E1142 Type 2 diabetes mellitus with diabetic polyneuropathy: Secondary | ICD-10-CM | POA: Diagnosis not present

## 2023-10-02 DIAGNOSIS — G4700 Insomnia, unspecified: Secondary | ICD-10-CM | POA: Diagnosis not present

## 2023-10-02 DIAGNOSIS — K760 Fatty (change of) liver, not elsewhere classified: Secondary | ICD-10-CM | POA: Diagnosis not present

## 2023-10-02 DIAGNOSIS — E1165 Type 2 diabetes mellitus with hyperglycemia: Secondary | ICD-10-CM | POA: Diagnosis not present

## 2023-10-02 DIAGNOSIS — Z7901 Long term (current) use of anticoagulants: Secondary | ICD-10-CM | POA: Diagnosis not present

## 2023-10-02 DIAGNOSIS — K766 Portal hypertension: Secondary | ICD-10-CM | POA: Diagnosis not present

## 2023-10-02 DIAGNOSIS — M109 Gout, unspecified: Secondary | ICD-10-CM | POA: Diagnosis not present

## 2023-10-02 DIAGNOSIS — I700 Atherosclerosis of aorta: Secondary | ICD-10-CM | POA: Diagnosis not present

## 2023-10-02 DIAGNOSIS — S12301D Unspecified nondisplaced fracture of fourth cervical vertebra, subsequent encounter for fracture with routine healing: Secondary | ICD-10-CM | POA: Diagnosis not present

## 2023-10-02 DIAGNOSIS — N189 Chronic kidney disease, unspecified: Secondary | ICD-10-CM | POA: Diagnosis not present

## 2023-10-02 DIAGNOSIS — I480 Paroxysmal atrial fibrillation: Secondary | ICD-10-CM | POA: Diagnosis not present

## 2023-10-02 DIAGNOSIS — Z6833 Body mass index (BMI) 33.0-33.9, adult: Secondary | ICD-10-CM | POA: Diagnosis not present

## 2023-10-02 DIAGNOSIS — I129 Hypertensive chronic kidney disease with stage 1 through stage 4 chronic kidney disease, or unspecified chronic kidney disease: Secondary | ICD-10-CM | POA: Diagnosis not present

## 2023-10-02 DIAGNOSIS — Z950 Presence of cardiac pacemaker: Secondary | ICD-10-CM | POA: Diagnosis not present

## 2023-10-02 DIAGNOSIS — E11649 Type 2 diabetes mellitus with hypoglycemia without coma: Secondary | ICD-10-CM | POA: Diagnosis not present

## 2023-10-02 DIAGNOSIS — M160 Bilateral primary osteoarthritis of hip: Secondary | ICD-10-CM | POA: Diagnosis not present

## 2023-10-02 DIAGNOSIS — M5416 Radiculopathy, lumbar region: Secondary | ICD-10-CM | POA: Diagnosis not present

## 2023-10-02 DIAGNOSIS — E1122 Type 2 diabetes mellitus with diabetic chronic kidney disease: Secondary | ICD-10-CM | POA: Diagnosis not present

## 2023-10-02 DIAGNOSIS — M19011 Primary osteoarthritis, right shoulder: Secondary | ICD-10-CM | POA: Diagnosis not present

## 2023-10-02 DIAGNOSIS — Z7985 Long-term (current) use of injectable non-insulin antidiabetic drugs: Secondary | ICD-10-CM | POA: Diagnosis not present

## 2023-10-02 DIAGNOSIS — E669 Obesity, unspecified: Secondary | ICD-10-CM | POA: Diagnosis not present

## 2023-10-02 DIAGNOSIS — K7460 Unspecified cirrhosis of liver: Secondary | ICD-10-CM | POA: Diagnosis not present

## 2023-10-02 DIAGNOSIS — K219 Gastro-esophageal reflux disease without esophagitis: Secondary | ICD-10-CM | POA: Diagnosis not present

## 2023-10-06 ENCOUNTER — Other Ambulatory Visit: Payer: Self-pay | Admitting: Infectious Diseases

## 2023-10-06 DIAGNOSIS — B20 Human immunodeficiency virus [HIV] disease: Secondary | ICD-10-CM

## 2023-10-06 DIAGNOSIS — G4700 Insomnia, unspecified: Secondary | ICD-10-CM | POA: Diagnosis not present

## 2023-10-06 DIAGNOSIS — D6869 Other thrombophilia: Secondary | ICD-10-CM | POA: Diagnosis not present

## 2023-10-07 DIAGNOSIS — E291 Testicular hypofunction: Secondary | ICD-10-CM | POA: Diagnosis not present

## 2023-10-08 DIAGNOSIS — L821 Other seborrheic keratosis: Secondary | ICD-10-CM | POA: Diagnosis not present

## 2023-10-08 DIAGNOSIS — D485 Neoplasm of uncertain behavior of skin: Secondary | ICD-10-CM | POA: Diagnosis not present

## 2023-10-08 DIAGNOSIS — Z08 Encounter for follow-up examination after completed treatment for malignant neoplasm: Secondary | ICD-10-CM | POA: Diagnosis not present

## 2023-10-08 DIAGNOSIS — D235 Other benign neoplasm of skin of trunk: Secondary | ICD-10-CM | POA: Diagnosis not present

## 2023-10-08 DIAGNOSIS — D225 Melanocytic nevi of trunk: Secondary | ICD-10-CM | POA: Diagnosis not present

## 2023-10-08 DIAGNOSIS — Z85828 Personal history of other malignant neoplasm of skin: Secondary | ICD-10-CM | POA: Diagnosis not present

## 2023-10-11 ENCOUNTER — Other Ambulatory Visit: Payer: Self-pay | Admitting: Family Medicine

## 2023-10-13 ENCOUNTER — Ambulatory Visit (INDEPENDENT_AMBULATORY_CARE_PROVIDER_SITE_OTHER): Payer: BC Managed Care – PPO

## 2023-10-13 DIAGNOSIS — I441 Atrioventricular block, second degree: Secondary | ICD-10-CM

## 2023-10-14 ENCOUNTER — Ambulatory Visit: Payer: Self-pay | Admitting: Cardiology

## 2023-10-14 ENCOUNTER — Other Ambulatory Visit: Payer: Self-pay | Admitting: Family Medicine

## 2023-10-14 ENCOUNTER — Other Ambulatory Visit: Payer: Self-pay

## 2023-10-14 LAB — CUP PACEART REMOTE DEVICE CHECK
Battery Remaining Longevity: 102 mo
Battery Voltage: 3 V
Brady Statistic AP VP Percent: 1.01 %
Brady Statistic AP VS Percent: 0 %
Brady Statistic AS VP Percent: 97.89 %
Brady Statistic AS VS Percent: 1.06 %
Brady Statistic RA Percent Paced: 1 %
Brady Statistic RV Percent Paced: 97.59 %
Date Time Interrogation Session: 20250804224233
Implantable Lead Connection Status: 753985
Implantable Lead Connection Status: 753985
Implantable Lead Implant Date: 20230202
Implantable Lead Implant Date: 20230202
Implantable Lead Location: 753859
Implantable Lead Location: 753860
Implantable Lead Model: 3830
Implantable Lead Model: 5076
Implantable Pulse Generator Implant Date: 20230202
Lead Channel Impedance Value: 323 Ohm
Lead Channel Impedance Value: 323 Ohm
Lead Channel Impedance Value: 380 Ohm
Lead Channel Impedance Value: 456 Ohm
Lead Channel Pacing Threshold Amplitude: 0.5 V
Lead Channel Pacing Threshold Amplitude: 1.25 V
Lead Channel Pacing Threshold Pulse Width: 0.4 ms
Lead Channel Pacing Threshold Pulse Width: 0.4 ms
Lead Channel Sensing Intrinsic Amplitude: 2.75 mV
Lead Channel Sensing Intrinsic Amplitude: 2.75 mV
Lead Channel Sensing Intrinsic Amplitude: 4.375 mV
Lead Channel Sensing Intrinsic Amplitude: 4.375 mV
Lead Channel Setting Pacing Amplitude: 1.5 V
Lead Channel Setting Pacing Amplitude: 2.5 V
Lead Channel Setting Pacing Pulse Width: 0.4 ms
Lead Channel Setting Sensing Sensitivity: 1.2 mV
Zone Setting Status: 755011

## 2023-10-14 MED ORDER — GABAPENTIN 300 MG PO CAPS: 600.0000 mg | ORAL_CAPSULE | Freq: Every day | ORAL | 2 refills | Status: AC

## 2023-10-21 DIAGNOSIS — E291 Testicular hypofunction: Secondary | ICD-10-CM | POA: Diagnosis not present

## 2023-10-26 DIAGNOSIS — K080 Exfoliation of teeth due to systemic causes: Secondary | ICD-10-CM | POA: Diagnosis not present

## 2023-10-30 DIAGNOSIS — E1142 Type 2 diabetes mellitus with diabetic polyneuropathy: Secondary | ICD-10-CM | POA: Diagnosis not present

## 2023-10-30 DIAGNOSIS — E291 Testicular hypofunction: Secondary | ICD-10-CM | POA: Diagnosis not present

## 2023-11-04 DIAGNOSIS — E291 Testicular hypofunction: Secondary | ICD-10-CM | POA: Diagnosis not present

## 2023-11-10 DIAGNOSIS — N189 Chronic kidney disease, unspecified: Secondary | ICD-10-CM | POA: Diagnosis not present

## 2023-11-10 DIAGNOSIS — E1142 Type 2 diabetes mellitus with diabetic polyneuropathy: Secondary | ICD-10-CM | POA: Diagnosis not present

## 2023-11-10 DIAGNOSIS — E291 Testicular hypofunction: Secondary | ICD-10-CM | POA: Diagnosis not present

## 2023-11-16 DIAGNOSIS — J984 Other disorders of lung: Secondary | ICD-10-CM | POA: Diagnosis not present

## 2023-11-16 DIAGNOSIS — Z9049 Acquired absence of other specified parts of digestive tract: Secondary | ICD-10-CM | POA: Diagnosis not present

## 2023-11-16 DIAGNOSIS — C184 Malignant neoplasm of transverse colon: Secondary | ICD-10-CM | POA: Diagnosis not present
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
## 2023-11-19 ENCOUNTER — Encounter: Payer: Self-pay | Admitting: Family Medicine

## 2023-11-19 ENCOUNTER — Ambulatory Visit (INDEPENDENT_AMBULATORY_CARE_PROVIDER_SITE_OTHER): Admitting: Family Medicine

## 2023-11-19 ENCOUNTER — Other Ambulatory Visit: Payer: Self-pay | Admitting: Student

## 2023-11-19 ENCOUNTER — Ambulatory Visit

## 2023-11-19 VITALS — BP 136/76 | HR 88 | Ht 69.0 in

## 2023-11-19 DIAGNOSIS — C184 Malignant neoplasm of transverse colon: Secondary | ICD-10-CM | POA: Diagnosis not present

## 2023-11-19 DIAGNOSIS — E291 Testicular hypofunction: Secondary | ICD-10-CM | POA: Diagnosis not present

## 2023-11-19 DIAGNOSIS — I480 Paroxysmal atrial fibrillation: Secondary | ICD-10-CM

## 2023-11-19 DIAGNOSIS — M545 Low back pain, unspecified: Secondary | ICD-10-CM

## 2023-11-19 DIAGNOSIS — S12301A Unspecified nondisplaced fracture of fourth cervical vertebra, initial encounter for closed fracture: Secondary | ICD-10-CM

## 2023-11-19 DIAGNOSIS — M5416 Radiculopathy, lumbar region: Secondary | ICD-10-CM | POA: Diagnosis not present

## 2023-11-19 DIAGNOSIS — S134XXA Sprain of ligaments of cervical spine, initial encounter: Secondary | ICD-10-CM

## 2023-11-19 DIAGNOSIS — M47816 Spondylosis without myelopathy or radiculopathy, lumbar region: Secondary | ICD-10-CM | POA: Diagnosis not present

## 2023-11-19 DIAGNOSIS — Z1509 Genetic susceptibility to other malignant neoplasm: Secondary | ICD-10-CM | POA: Diagnosis not present

## 2023-11-19 DIAGNOSIS — M542 Cervicalgia: Secondary | ICD-10-CM

## 2023-11-19 NOTE — Patient Instructions (Signed)
 Epidural L4/L5 PT [place] See me in 2 months

## 2023-11-19 NOTE — Assessment & Plan Note (Signed)
 Continue to follow-up with other providers, he is over 3 months out though so should be able to start increasing activity even more.

## 2023-11-19 NOTE — Assessment & Plan Note (Signed)
 Chronic problem with exacerbation.  Seems to have more of a left-sided lumbar radiculopathy that is aggravated.  Did do see when I look at his x-rays there is a questionable fracture of the DISH at the L4-L5 area.  Patient's symptoms previously seem to be more at the L3 nerve root but I do think that there is actually more at the L4-L5 with the discomfort at this time.  I would consider the possibility of a an injection at this level which patient is willing to do as well as starting on formal physical therapy for the modalities such as dry needling, TENS unit as well.  Follow-up again in 6 to 8 weeks otherwise.

## 2023-11-20 NOTE — Telephone Encounter (Signed)
 Prescription refill request for Eliquis  received. Indication: a fib Last office visit: 08/17/23 Scr: 1.42 epic 08/03/23 Age:  73 Weight: 102kg

## 2023-11-23 NOTE — Telephone Encounter (Signed)
 Spoke with the patient:  Name: [name], [name]. Deward MRN: [id number]  Date: 12/09/2023 Status: Sch  Time: 2:00 PM Length: 15  Visit Type: OFFICE VISIT [8002] Copay: $0.00  Provider: Eben Reyes BROCKS, MD      Copied from CRM [id number]. Topic: Appointments - Appointment Scheduling >> Nov 19, 2023  3:17 PM Mercer PEDLAR wrote: Patient would like a callback to schedule an appointment with Dr. Eben for 6 month follow up in October.

## 2023-11-24 ENCOUNTER — Telehealth: Payer: Self-pay

## 2023-11-24 ENCOUNTER — Other Ambulatory Visit

## 2023-11-24 NOTE — Telephone Encounter (Signed)
   Pre-operative Risk Assessment    Patient Name: [name].  DOB: 08-21-1950 MRN: [id number]   Date of last office visit: 08/17/23 Date of next office visit: Not scheduled   Request for Surgical Clearance    Procedure:  Lumbar Epirdural  Date of Surgery:  Clearance TBD                                Surgeon:  Not provided Surgeon's Group or Practice Name:  [facility] Phone number:  [phone] Fax number:  [phone]   Type of Clearance Requested:   - Medical  - Pharmacy:  Hold Apixaban  (Eliquis ) x4 doses   Type of Anesthesia:  Not Indicated   Additional requests/questions:    Bonney Ival LOISE Gerome   11/24/2023, 10:47 AM

## 2023-11-25 ENCOUNTER — Other Ambulatory Visit (HOSPITAL_COMMUNITY)
Admission: RE | Admit: 2023-11-25 | Discharge: 2023-11-25 | Disposition: A | Discharge status: Home / Self Care | Source: Ambulatory Visit | Attending: Internal Medicine | Admitting: Internal Medicine

## 2023-11-25 ENCOUNTER — Other Ambulatory Visit

## 2023-11-25 DIAGNOSIS — Z79899 Other long term (current) drug therapy: Secondary | ICD-10-CM

## 2023-11-25 DIAGNOSIS — B20 Human immunodeficiency virus [HIV] disease: Secondary | ICD-10-CM | POA: Diagnosis not present

## 2023-11-25 DIAGNOSIS — M4722 Other spondylosis with radiculopathy, cervical region: Secondary | ICD-10-CM | POA: Diagnosis not present

## 2023-11-25 DIAGNOSIS — E118 Type 2 diabetes mellitus with unspecified complications: Secondary | ICD-10-CM

## 2023-11-25 DIAGNOSIS — S12301D Unspecified nondisplaced fracture of fourth cervical vertebra, subsequent encounter for fracture with routine healing: Secondary | ICD-10-CM | POA: Diagnosis not present

## 2023-11-26 LAB — COMPREHENSIVE METABOLIC PANEL WITH GFR
ALT: 14 IU/L (ref 0–44)
AST: 17 IU/L (ref 0–40)
Albumin: 4.3 g/dL (ref 3.8–4.8)
Alkaline Phosphatase: 99 IU/L (ref 47–123)
BUN/Creatinine Ratio: 10 (ref 10–24)
BUN: 20 mg/dL (ref 8–27)
Bilirubin Total: 2.4 mg/dL — ABNORMAL HIGH (ref 0.0–1.2)
CO2: 19 mmol/L — ABNORMAL LOW (ref 20–29)
Calcium: 9.2 mg/dL (ref 8.6–10.2)
Chloride: 99 mmol/L (ref 96–106)
Creatinine, Ser: 1.93 mg/dL — ABNORMAL HIGH (ref 0.76–1.27)
Globulin, Total: 2.6 g/dL (ref 1.5–4.5)
Glucose: 148 mg/dL — ABNORMAL HIGH (ref 70–99)
Potassium: 4.1 mmol/L (ref 3.5–5.2)
Sodium: 137 mmol/L (ref 134–144)
Total Protein: 6.9 g/dL (ref 6.0–8.5)
eGFR: 37 mL/min/1.73 — ABNORMAL LOW (ref >59)

## 2023-11-26 LAB — LIPID PANEL
Chol/HDL Ratio: 3.5 ratio (ref 0.0–5.0)
Cholesterol, Total: 99 mg/dL — ABNORMAL LOW (ref 100–199)
HDL: 28 mg/dL — ABNORMAL LOW (ref >39)
LDL Chol Calc (NIH): 11 mg/dL (ref 0–99)
Triglycerides: 449 mg/dL — ABNORMAL HIGH (ref 0–149)
VLDL Cholesterol Cal: 60 mg/dL — ABNORMAL HIGH (ref 5–40)

## 2023-11-26 LAB — HIV-1 RNA QUANT-NO REFLEX-BLD: HIV-1 RNA Viral Load: <20 {copies}/mL

## 2023-11-26 LAB — CBC
Hematocrit: 49.6 % (ref 37.5–51.0)
Hemoglobin: 16.4 g/dL (ref 13.0–17.7)
MCH: 30 pg (ref 26.6–33.0)
MCHC: 33.1 g/dL (ref 31.5–35.7)
MCV: 91 fL (ref 79–97)
Platelets: 191 x10E3/uL (ref 150–450)
RBC: 5.46 x10E6/uL (ref 4.14–5.80)
RDW: 14.9 % (ref 11.6–15.4)
WBC: 8.7 x10E3/uL (ref 3.4–10.8)

## 2023-11-26 LAB — RPR: RPR Ser Ql: NONREACTIVE

## 2023-11-26 LAB — URINE CYTOLOGY ANCILLARY ONLY
Chlamydia: NEGATIVE
Comment: NEGATIVE
Comment: NORMAL
Neisseria Gonorrhea: NEGATIVE

## 2023-11-27 ENCOUNTER — Telehealth: Payer: Self-pay

## 2023-11-27 ENCOUNTER — Ambulatory Visit: Payer: Self-pay | Admitting: Family Medicine

## 2023-11-27 LAB — T-HELPER CELL (CD4) - (RCID CLINIC ONLY)
CD4 % Helper T Cell: 15 % — ABNORMAL LOW (ref 33–65)
CD4 T Cell Abs: 364 /uL — ABNORMAL LOW (ref 400–1790)

## 2023-11-27 NOTE — Telephone Encounter (Signed)
   Name: [name].  DOB: [birth year]  MRN: [id number]  Primary Cardiologist: None   Preoperative team, please contact this patient and set up a phone call appointment for further preoperative risk assessment. Please obtain consent and complete medication review. Thank you for your help.  I confirm that guidance regarding antiplatelet and oral anticoagulation therapy has been completed and, if necessary, noted below.  Per office protocol, patient can hold Eliquis  for 3 days prior to procedure.      I also confirmed the patient resides in the state of [place] . As per [facility] Medical Board telemedicine laws, the patient must reside in the state in which the provider is licensed.   [name], NP 11/27/2023, 4:45 PM [facility]

## 2023-11-27 NOTE — Telephone Encounter (Signed)
 Preop tele appt now scheduled, med rec and consent done.

## 2023-11-27 NOTE — Telephone Encounter (Signed)
  Patient Consent for Virtual Visit        [name]. has provided verbal consent on 11/27/2023 for a virtual visit (video or telephone).   CONSENT FOR VIRTUAL VISIT FOR:  [name].  By participating in this virtual visit I agree to the following:  I hereby voluntarily request, consent and authorize  [facility] and its employed or contracted physicians, physician assistants, nurse practitioners or other licensed health care professionals (the Practitioner), to provide me with telemedicine health care services (the "Services) as deemed necessary by the treating Practitioner. I acknowledge and consent to receive the Services by the Practitioner via telemedicine. I understand that the telemedicine visit will involve communicating with the Practitioner through live audiovisual communication technology and the disclosure of certain medical information by electronic transmission. I acknowledge that I have been given the opportunity to request an in-person assessment or other available alternative prior to the telemedicine visit and am voluntarily participating in the telemedicine visit.  I understand that I have the right to withhold or withdraw my consent to the use of telemedicine in the course of my care at any time, without affecting my right to future care or treatment, and that the Practitioner or I may terminate the telemedicine visit at any time. I understand that I have the right to inspect all information obtained and/or recorded in the course of the telemedicine visit and may receive copies of available information for a reasonable fee.  I understand that some of the potential risks of receiving the Services via telemedicine include:  Delay or interruption in medical evaluation due to technological equipment failure or disruption; Information transmitted may not be sufficient (e.g. poor resolution of images) to allow for appropriate medical decision making by the  Practitioner; and/or  In rare instances, security protocols could fail, causing a breach of personal health information.  Furthermore, I acknowledge that it is my responsibility to provide information about my medical history, conditions and care that is complete and accurate to the best of my ability. I acknowledge that Practitioner's advice, recommendations, and/or decision may be based on factors not within their control, such as incomplete or inaccurate data provided by me or distortions of diagnostic images or specimens that may result from electronic transmissions. I understand that the practice of medicine is not an exact science and that Practitioner makes no warranties or guarantees regarding treatment outcomes. I acknowledge that a copy of this consent can be made available to me via my patient portal [facility] MyChart), or I can request a printed copy by calling the office of  [facility].    I understand that my insurance will be billed for this visit.   I have read or had this consent read to me. I understand the contents of this consent, which adequately explains the benefits and risks of the Services being provided via telemedicine.  I have been provided ample opportunity to ask questions regarding this consent and the Services and have had my questions answered to my satisfaction. I give my informed consent for the services to be provided through the use of telemedicine in my medical care

## 2023-11-27 NOTE — Telephone Encounter (Signed)
 Patient with diagnosis of afib on Eliquis  for anticoagulation.    Procedure: Lumbar Epirdural  Date of procedure: TBD   CHA2DS2-VASc Score = 3   This indicates a 3.2% annual risk of stroke. The patient's score is based upon: CHF History: 0 HTN History: 1 Diabetes History: 1 Stroke History: 0 Vascular Disease History: 0 Age Score: 1 Gender Score: 0      CrCl 41 ml/min Platelet count 191  Patient has not had an Afib/aflutter ablation or Watchman within the last 3 months or DCCV within the last 30 days   Per office protocol, patient can hold Eliquis  for 3 days prior to procedure.    **This guidance is not considered finalized until pre-operative APP has relayed final recommendations.**

## 2023-11-30 DIAGNOSIS — C184 Malignant neoplasm of transverse colon: Secondary | ICD-10-CM | POA: Diagnosis not present

## 2023-12-01 ENCOUNTER — Ambulatory Visit: Attending: Cardiology | Admitting: Nurse Practitioner

## 2023-12-01 DIAGNOSIS — Z01810 Encounter for preprocedural cardiovascular examination: Secondary | ICD-10-CM | POA: Diagnosis not present

## 2023-12-01 NOTE — Progress Notes (Signed)
 Virtual Visit via Telephone Note   Because of Tori Cupps. co-morbid illnesses, he is at least at moderate risk for complications without adequate follow up.  This format is felt to be most appropriate for this patient at this time.  Due to technical limitations with video connection (technology), today's appointment will be conducted as an audio only telehealth visit, and [name]. verbally agreed to proceed in this manner.   All issues noted in this document were discussed and addressed.  No physical exam could be performed with this format.  Evaluation Performed:  Preoperative cardiovascular risk assessment _____________   Date:  12/01/2023   Patient ID:  Joshua Layman Raddle., DOB Mar 30, 1950, MRN [id number] Patient Location:  Home Provider location:   Office  Primary Care Provider:  Gerome Brunet, DO Primary Cardiologist:  None  Chief Complaint / Patient Profile   73 y.o. y/o male with a h/o paroxysmal atrial fibrillation, second-degree AV block s/p PPM, hypertension, type 2 diabetes, CKD, HIV, hemochromatosis, and gout who is pending lumbar epidural with [facility] and presents today for telephonic preoperative cardiovascular risk assessment.  History of Present Illness    Joshua Bednarczyk. is a 73 y.o. male who presents via audio/video conferencing for a telehealth visit today.  Pt was last seen in cardiology clinic on 08/17/2023 by Dr. Inocencio.  At that time [name]. was doing well.  The patient is now pending procedure as outlined above. Since his last visit, he has done well from a cardiac standpoint.   He denies chest pain, palpitations, dyspnea, pnd, orthopnea, n, v, dizziness, syncope, edema, weight gain, or early satiety. All other systems reviewed and are otherwise negative except as noted above.   Past Medical History    Past Medical History:  Diagnosis Date   Complication of anesthesia     ANESTHESIA AWARENESS  DURING ADRENALECTOMY AND COLONOSCOPY   Diabetes mellitus    on oral meds-no insulin    DIABETES MELLITUS, TYPE II, UNCONTROLLED 06/22/2006   GILBERT'S SYNDROME 02/25/2007   GOUT 06/22/2006   HEMOCHROMATOSIS, HX OF 12/15/2005   phlebotomy every 6 to 8 weeks--at [facility]   HIV disease (HCC) 1990   DR. HATCHER WITH [facility]   HNP (herniated nucleus pulposus)    LUMBAR PAIN WITH PAIN DOWN LEFT LEG TO TOES-SOME NUMBNESS AND TINGLING IN BIG TOE   Hypertension    Past Surgical History:  Procedure Laterality Date   ADRENALECTOMY     RIGHT ADRENALECTOMY    CHOLECYSTECTOMY     LUMBAR LAMINECTOMY/DECOMPRESSION MICRODISCECTOMY  04/23/2011   Procedure: LUMBAR LAMINECTOMY/DECOMPRESSION MICRODISCECTOMY;  Surgeon: Reyes JAYSON Billing, MD;  Location: [facility];  Service: Orthopedics;  Laterality: N/A;  Decompression L5 S1 (X-Ray)   ORIF LEFT SHOULDER     STILL HAS HARDWARE IN THE SHOULDER   PACEMAKER IMPLANT N/A 04/11/2021   Procedure: PACEMAKER IMPLANT;  Surgeon: Inocencio Soyla Lunger, MD;  Location: [facility];  Service: Cardiovascular;  Laterality: N/A;    Allergies  Allergies  Allergen Reactions   Hydromorphone  Itching and Rash   Scopolamine     GIVEN WITH A SURGERY YRS AGO--CAUSED HALLUNCINATIONS Other reaction(s): Confusion/Altered Mental Status Given during Surgery many years ago - Caused Hallucinations GIVEN WITH A SURGERY YRS AGO--CAUSED HALLUNCINATIONS GIVEN WITH A SURGERY YRS AGO--CAUSED HALLUNCINATIONS   Beta Adrenergic Blockers Other (See Comments)    [place]chebach--slowed heart rhythm  Other reaction(s): patient has [place]  Escitalopram  Oxalate Rash and Other (See Comments)    lightheaded,  visual  disturbances   Codeine Itching   Morphine Sulfate Rash    Home Medications    Prior to Admission medications   Medication Sig Start Date End Date Taking? Authorizing Provider  acetaminophen  (TYLENOL ) 500 MG tablet  Take 1,000 mg by mouth every 6 (six) hours as needed (for pain.).    [provider]  allopurinol  (ZYLOPRIM ) 300 MG tablet Take 300 mg by mouth in the morning.    [provider]  amLODipine  (NORVASC ) 5 MG tablet Take 1 tablet (5 mg total) by mouth daily. 04/09/21   Camnitz, Will Gladis, MD  apixaban  (ELIQUIS ) 5 MG TABS tablet TAKE 1 TABLET(5 MG) BY MOUTH TWICE DAILY 11/20/23   Camnitz, Soyla Gladis, MD  atorvastatin  (LIPITOR) 10 MG tablet Take 10 mg by mouth at bedtime. 10/01/19   [provider]  carvedilol  (COREG ) 25 MG tablet TAKE 1 TABLET(25 MG) BY MOUTH TWICE DAILY WITH A MEAL 05/05/22   Camnitz, Soyla Gladis, MD  cholestyramine ORMA) 4 GM/DOSE powder Take 4 g by mouth 3 (three) times daily with meals as needed (diarrhea). 03/12/22   [provider]  Cyanocobalamin  1000 MCG/ML KIT Inject 1,000 mcg as directed See admin instructions. Injection 1000mcg every two weeks.    [provider]  Dapagliflozin -metFORMIN  HCl ER (XIGDUO XR) 12-998 MG TB24 Take 1 tablet by mouth daily.    [provider]  fexofenadine (ALLEGRA) 180 MG tablet Take 180 mg by mouth in the morning.    [provider]  furosemide  (LASIX ) 20 MG tablet Take 20 mg by mouth in the morning. 04/29/19   [provider]  gabapentin  (NEURONTIN ) 300 MG capsule Take 2 capsules (600 mg total) by mouth at bedtime. 10/14/23   Claudene Arthea HERO, DO  hydrALAZINE  (APRESOLINE ) 25 MG tablet Take 75 mg by mouth 3 (three) times daily. 12/07/19   [provider]  MOUNJARO 5 MG/0.5ML Pen Inject 5 mg into the skin once a week. Inject on Sunday 07/20/23   [provider]  ODEFSEY  200-25-25 MG TABS tablet TAKE ONE TABLET DAILY WITH BREAKFAST 10/06/23   Eben Reyes BROCKS, MD  Omega-3 Fatty Acids (FISH OIL PO) Take 3 capsules by mouth at bedtime. 1400 mg each    [provider]  senna-docusate (SENOKOT-S) 8.6-50 MG tablet Take 2 tablets by mouth 2 (two) times daily  between meals as needed for mild constipation or moderate constipation. Patient not taking: Reported on 08/05/2023 08/04/23   Gonfa, Taye T, MD  Tavaborole  (KERYDIN ) 5 % SOLN Apply 1 drop topically daily. Apply 1 drop to the toenail daily. 12/23/22   Gershon Donnice SAUNDERS, DPM  telmisartan  (MICARDIS ) 80 MG tablet Take 80 mg by mouth daily before breakfast.    [provider]  testosterone  cypionate (DEPOTESTOSTERONE CYPIONATE) 200 MG/ML injection Inject 200 mg into the muscle every 14 (fourteen) days. 07/08/19   [provider]  traZODone  (DESYREL ) 50 MG tablet Take 50-100 mg by mouth at bedtime. 07/23/23   [provider]  vitamin E 200 UNIT capsule Take 200 Units by mouth daily.    [provider]    Physical Exam    Vital Signs:  Joshua Imhoff. does not have vital signs available for review today.  Given telephonic nature of communication, physical exam is limited. AAOx3. NAD. Normal affect.  Speech and respirations are unlabored.  Accessory Clinical Findings    None  Assessment & Plan  1.  Preoperative Cardiovascular Risk Assessment:  According to the Revised Cardiac Risk Index (RCRI), his Perioperative Risk of Major Cardiac Event is (%): 0.4. His Functional Capacity in METs is: 7.01 according to the [place] Activity Status Index (DASI).Therefore, based on ACC/AHA guidelines, patient would be at acceptable risk for the planned procedure without further cardiovascular testing.  The patient was advised that if he develops new symptoms prior to surgery to contact our office to arrange for a follow-up visit, and he verbalized understanding.  Per office protocol, patient can hold Eliquis  for 3 days prior to procedure.  Please resume Eliquis  as soon as possible postprocedure, at the discretion of the surgeon.    A copy of this note will be routed to requesting surgeon.  Time:   Today, I have spent 5 minutes with the patient with telehealth technology  discussing medical history, symptoms, and management plan.     [name], NP  12/01/2023, 9:27 AM

## 2023-12-02 DIAGNOSIS — E291 Testicular hypofunction: Secondary | ICD-10-CM | POA: Diagnosis not present
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
## 2023-12-07 DIAGNOSIS — R0602 Shortness of breath: Secondary | ICD-10-CM | POA: Diagnosis not present

## 2023-12-07 DIAGNOSIS — R509 Fever, unspecified: Secondary | ICD-10-CM | POA: Diagnosis not present

## 2023-12-07 DIAGNOSIS — R051 Acute cough: Secondary | ICD-10-CM | POA: Diagnosis not present

## 2023-12-07 NOTE — Progress Notes (Signed)
 Remote PPM Transmission

## 2023-12-09 ENCOUNTER — Ambulatory Visit: Admitting: Infectious Diseases

## 2023-12-09 ENCOUNTER — Ambulatory Visit: Admitting: Physical Therapy

## 2023-12-09 ENCOUNTER — Encounter: Payer: Self-pay | Admitting: Physical Therapy

## 2023-12-09 ENCOUNTER — Other Ambulatory Visit: Payer: Self-pay

## 2023-12-09 VITALS — BP 146/62 | HR 85 | Temp 97.7°F | Ht 69.0 in | Wt 229.4 lb

## 2023-12-09 DIAGNOSIS — S134XXA Sprain of ligaments of cervical spine, initial encounter: Secondary | ICD-10-CM | POA: Insufficient documentation

## 2023-12-09 DIAGNOSIS — N182 Chronic kidney disease, stage 2 (mild): Secondary | ICD-10-CM | POA: Diagnosis not present

## 2023-12-09 DIAGNOSIS — E1121 Type 2 diabetes mellitus with diabetic nephropathy: Secondary | ICD-10-CM

## 2023-12-09 DIAGNOSIS — E1142 Type 2 diabetes mellitus with diabetic polyneuropathy: Secondary | ICD-10-CM

## 2023-12-09 DIAGNOSIS — M5459 Other low back pain: Secondary | ICD-10-CM | POA: Diagnosis not present

## 2023-12-09 DIAGNOSIS — M6281 Muscle weakness (generalized): Secondary | ICD-10-CM | POA: Diagnosis not present

## 2023-12-09 DIAGNOSIS — Z15068 Genetic susceptibility to other malignant neoplasm of digestive system: Secondary | ICD-10-CM

## 2023-12-09 DIAGNOSIS — Z1509 Genetic susceptibility to other malignant neoplasm: Secondary | ICD-10-CM

## 2023-12-09 DIAGNOSIS — B351 Tinea unguium: Secondary | ICD-10-CM

## 2023-12-09 DIAGNOSIS — Z79899 Other long term (current) drug therapy: Secondary | ICD-10-CM

## 2023-12-09 DIAGNOSIS — M62838 Other muscle spasm: Secondary | ICD-10-CM | POA: Diagnosis not present

## 2023-12-09 DIAGNOSIS — B20 Human immunodeficiency virus [HIV] disease: Secondary | ICD-10-CM | POA: Diagnosis not present

## 2023-12-09 DIAGNOSIS — M542 Cervicalgia: Secondary | ICD-10-CM | POA: Diagnosis not present

## 2023-12-09 DIAGNOSIS — I10 Essential (primary) hypertension: Secondary | ICD-10-CM

## 2023-12-09 DIAGNOSIS — Z15060 Genetic susceptibility to colorectal cancer: Secondary | ICD-10-CM

## 2023-12-09 DIAGNOSIS — M545 Low back pain, unspecified: Secondary | ICD-10-CM | POA: Diagnosis not present

## 2023-12-09 DIAGNOSIS — Z113 Encounter for screening for infections with a predominantly sexual mode of transmission: Secondary | ICD-10-CM

## 2023-12-09 DIAGNOSIS — Z1507 Genetic susceptibility to malignant neoplasm of urinary tract: Secondary | ICD-10-CM

## 2023-12-09 NOTE — Assessment & Plan Note (Signed)
 He is doing well on odefsy Will see him back in 6 months Encourage him to get flu and covid vax when he feels better Labs in 6 months.

## 2023-12-09 NOTE — Assessment & Plan Note (Signed)
 Repeat Colon in December Last CEA stable.  Had recent onc and surgery f/u.

## 2023-12-09 NOTE — Assessment & Plan Note (Signed)
 He is hoping that a spinal stimulator will help this.  He continue on neurontin .

## 2023-12-09 NOTE — Assessment & Plan Note (Signed)
 Has f/u with Dr Sherene.

## 2023-12-09 NOTE — Assessment & Plan Note (Signed)
 Appreciate his f/u with Dr Faythe Has podiatry f/u as well.  He has optho f/u Will get flu covid vax when he feels better

## 2023-12-09 NOTE — Assessment & Plan Note (Signed)
 Appreciate the f/u with [facility] Will recheck his Cr. Alert him to results.

## 2023-12-09 NOTE — Assessment & Plan Note (Signed)
 Appreciate Dr Alida f/u
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
## 2023-12-10 LAB — BASIC METABOLIC PANEL WITH GFR
BUN/Creatinine Ratio: 14 (ref 10–24)
BUN: 27 mg/dL (ref 8–27)
CO2: 21 mmol/L (ref 20–29)
Calcium: 9.2 mg/dL (ref 8.6–10.2)
Chloride: 97 mmol/L (ref 96–106)
Creatinine, Ser: 1.96 mg/dL — ABNORMAL HIGH (ref 0.76–1.27)
Glucose: 256 mg/dL — ABNORMAL HIGH (ref 70–99)
Potassium: 4.5 mmol/L (ref 3.5–5.2)
Sodium: 135 mmol/L (ref 134–144)
eGFR: 36 mL/min/1.73 — ABNORMAL LOW (ref >59)

## 2023-12-14 ENCOUNTER — Ambulatory Visit: Admitting: Physical Therapy

## 2023-12-14 ENCOUNTER — Encounter: Payer: Self-pay | Admitting: Physical Therapy

## 2023-12-14 DIAGNOSIS — M542 Cervicalgia: Secondary | ICD-10-CM | POA: Diagnosis not present

## 2023-12-14 DIAGNOSIS — M6281 Muscle weakness (generalized): Secondary | ICD-10-CM | POA: Diagnosis not present

## 2023-12-14 DIAGNOSIS — M62838 Other muscle spasm: Secondary | ICD-10-CM | POA: Diagnosis not present

## 2023-12-14 DIAGNOSIS — M5459 Other low back pain: Secondary | ICD-10-CM

## 2023-12-14 DIAGNOSIS — M545 Low back pain, unspecified: Secondary | ICD-10-CM | POA: Diagnosis not present

## 2023-12-14 DIAGNOSIS — S134XXA Sprain of ligaments of cervical spine, initial encounter: Secondary | ICD-10-CM | POA: Diagnosis not present
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
## 2023-12-17 ENCOUNTER — Ambulatory Visit

## 2023-12-17 ENCOUNTER — Ambulatory Visit: Admitting: Family Medicine

## 2023-12-17 DIAGNOSIS — M5459 Other low back pain: Secondary | ICD-10-CM

## 2023-12-17 DIAGNOSIS — M542 Cervicalgia: Secondary | ICD-10-CM

## 2023-12-17 DIAGNOSIS — M62838 Other muscle spasm: Secondary | ICD-10-CM

## 2023-12-17 DIAGNOSIS — M6281 Muscle weakness (generalized): Secondary | ICD-10-CM | POA: Diagnosis not present

## 2023-12-17 DIAGNOSIS — M545 Low back pain, unspecified: Secondary | ICD-10-CM | POA: Diagnosis not present

## 2023-12-17 DIAGNOSIS — S134XXA Sprain of ligaments of cervical spine, initial encounter: Secondary | ICD-10-CM | POA: Diagnosis not present
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
## 2023-12-22 ENCOUNTER — Ambulatory Visit

## 2023-12-22 DIAGNOSIS — S134XXA Sprain of ligaments of cervical spine, initial encounter: Secondary | ICD-10-CM | POA: Diagnosis not present

## 2023-12-22 DIAGNOSIS — M6281 Muscle weakness (generalized): Secondary | ICD-10-CM | POA: Diagnosis not present

## 2023-12-22 DIAGNOSIS — M542 Cervicalgia: Secondary | ICD-10-CM

## 2023-12-22 DIAGNOSIS — M62838 Other muscle spasm: Secondary | ICD-10-CM | POA: Diagnosis not present

## 2023-12-22 DIAGNOSIS — M5459 Other low back pain: Secondary | ICD-10-CM | POA: Diagnosis not present

## 2023-12-22 DIAGNOSIS — E291 Testicular hypofunction: Secondary | ICD-10-CM | POA: Diagnosis not present

## 2023-12-22 DIAGNOSIS — M545 Low back pain, unspecified: Secondary | ICD-10-CM | POA: Diagnosis not present

## 2023-12-22 NOTE — Therapy (Signed)
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 Gaskins, PTA 12/22/2023, 4:26 PM

## 2023-12-23 ENCOUNTER — Other Ambulatory Visit

## 2023-12-24 ENCOUNTER — Ambulatory Visit: Admitting: Physical Therapy

## 2023-12-24 ENCOUNTER — Encounter: Payer: Self-pay | Admitting: Physical Therapy

## 2023-12-24 DIAGNOSIS — M542 Cervicalgia: Secondary | ICD-10-CM | POA: Diagnosis not present

## 2023-12-24 DIAGNOSIS — M545 Low back pain, unspecified: Secondary | ICD-10-CM | POA: Diagnosis not present

## 2023-12-24 DIAGNOSIS — M5459 Other low back pain: Secondary | ICD-10-CM | POA: Diagnosis not present

## 2023-12-24 DIAGNOSIS — M62838 Other muscle spasm: Secondary | ICD-10-CM

## 2023-12-24 DIAGNOSIS — M6281 Muscle weakness (generalized): Secondary | ICD-10-CM

## 2023-12-24 DIAGNOSIS — S134XXA Sprain of ligaments of cervical spine, initial encounter: Secondary | ICD-10-CM | POA: Diagnosis not present

## 2023-12-24 NOTE — Therapy (Signed)
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 will demonstrate improved functional strength as demonstrated by improved scapular and LE strength to >/= 4+/5. Baseline: Refer to above UE/LE MMT tables Goal status: INITIAL  8.  Patient will report </= 12% on NDI (MCID = 10%) to demonstrate improved functional ability.  Baseline: 11 / 50 = 22.0 % Goal status: INITIAL   9.  Patient will report </= 35% on Modified Oswestry (MCID = 12.8%) to demonstrate improved functional ability.  Baseline: 24 / 50 = 48.0 % Goal status: INITIAL  10.  Patient to report ability to perform ADLs, household, and leisure-related tasks without limitation due to neck or low back pain, LOM or weakness Baseline:  Goal status: INITIAL   11.  Patient will tolerate 10-20 min of standing to perform  self-care and ADLs. Baseline: unable to stand to brush teeth w/o severe LBP Goal status: INITIAL   PLAN:  PT FREQUENCY: 2x/week  PT DURATION: 8-12 weeks  PLANNED INTERVENTIONS: 97164- PT Re-evaluation, 97750- Physical Performance Testing, 97110-Therapeutic exercises, 97530- Therapeutic activity, 97112- Neuromuscular re-education, 97535- Self Care, 02859- Manual therapy, [phone]- Gait training, [phone]- Aquatic Therapy, [phone]- Electrical stimulation (unattended), [phone]- Ultrasound, 02987- Traction (mechanical), 20560 (1-2 muscles), 20561 (3+ muscles)- Dry Needling, Patient/Family education, Balance training, Taping, Joint mobilization, Spinal mobilization, Cryotherapy, and Moist heat  PLAN FOR NEXT SESSION: gentle cervical AA/AROM; postural strengthening as well as progress core/lumbopelvic flexibility and stabilization/strengthening   Elijah CHRISTELLA Hidden, PT 12/24/2023, 5:34 PM

## 2023-12-28 ENCOUNTER — Ambulatory Visit

## 2023-12-28 DIAGNOSIS — M542 Cervicalgia: Secondary | ICD-10-CM | POA: Diagnosis not present

## 2023-12-28 DIAGNOSIS — M62838 Other muscle spasm: Secondary | ICD-10-CM | POA: Diagnosis not present

## 2023-12-28 DIAGNOSIS — M6281 Muscle weakness (generalized): Secondary | ICD-10-CM | POA: Diagnosis not present

## 2023-12-28 DIAGNOSIS — M5459 Other low back pain: Secondary | ICD-10-CM

## 2023-12-28 DIAGNOSIS — S134XXA Sprain of ligaments of cervical spine, initial encounter: Secondary | ICD-10-CM | POA: Diagnosis not present

## 2023-12-28 DIAGNOSIS — M545 Low back pain, unspecified: Secondary | ICD-10-CM | POA: Diagnosis not present

## 2023-12-28 NOTE — Therapy (Signed)
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 status: INITIAL  6.  Patient will demonstrate functional pain free lumbar ROM to perform ADLs.   Baseline: Refer to above lumbar ROM table Goal status: INITIAL  7.  Patient will demonstrate improved functional strength as demonstrated by improved scapular and LE strength to >/= 4+/5. Baseline: Refer to above UE/LE MMT tables Goal status: INITIAL  8.  Patient will report </= 12% on NDI (MCID = 10%) to demonstrate improved functional ability.  Baseline: 11 / 50 = 22.0 % Goal status: INITIAL   9.  Patient will report </= 35% on Modified Oswestry (MCID = 12.8%) to demonstrate improved functional ability.  Baseline: 24 / 50 = 48.0 % Goal status: INITIAL  10.  Patient to  report ability to perform ADLs, household, and leisure-related tasks without limitation due to neck or low back pain, LOM or weakness Baseline:  Goal status: INITIAL   11.  Patient will tolerate 10-20 min of standing to perform self-care and ADLs. Baseline: unable to stand to brush teeth w/o severe LBP Goal status: INITIAL   PLAN:  PT FREQUENCY: 2x/week  PT DURATION: 8-12 weeks  PLANNED INTERVENTIONS: 97164- PT Re-evaluation, 97750- Physical Performance Testing, 97110-Therapeutic exercises, 97530- Therapeutic activity, 97112- Neuromuscular re-education, 97535- Self Care, 02859- Manual therapy, [phone]- Gait training, [phone]- Aquatic Therapy, [phone]- Electrical stimulation (unattended), [phone]- Ultrasound, 02987- Traction (mechanical), 20560 (1-2 muscles), 20561 (3+ muscles)- Dry Needling, Patient/Family education, Balance training, Taping, Joint mobilization, Spinal mobilization, Cryotherapy, and Moist heat  PLAN FOR NEXT SESSION: gentle cervical AA/AROM; postural strengthening as well as progress core/lumbopelvic flexibility and stabilization/strengthening   Jaxon Mynhier L Shloimy Michalski, PTA 12/28/2023, 4:19 PM

## 2023-12-29 NOTE — Discharge Instructions (Signed)
 Post Procedure Spinal Discharge Instruction Sheet  You may resume a regular diet and any medications that you routinely take (including pain medications) unless otherwise noted by MD.    No driving day of procedure.  Light activity throughout the rest of the day.  Do not do any strenuous work, exercise, bending or lifting.  The day following the procedure, you can resume normal physical activity but you should refrain from exercising or physical therapy for at least three days thereafter.  You may apply ice to the injection site, 20 minutes on, 20 minutes off, as needed. Do not apply ice directly to skin.    Common Side Effects:  Headaches- take your usual medications as directed by your physician.  Increase your fluid intake.  Caffeinated beverages may be helpful.  Lie flat in bed until your headache resolves.  Restlessness or inability to sleep- you may have trouble sleeping for the next few days.  Ask your referring physician if you need any medication for sleep.  Facial flushing or redness- should subside within a few days.  Increased pain- a temporary increase in pain a day or two following your procedure is not unusual.  Take your pain medication as prescribed by your referring physician.  Leg cramps  Please contact our office at [phone] for the following symptoms: Fever greater than 100 degrees. Headaches unresolved with medication after 2-3 days. Increased swelling, pain, or redness at injection site.   Thank you for visiting [facility] today!   May Restart Your Eliquis  in 24 Hours

## 2023-12-30 ENCOUNTER — Ambulatory Visit
Admission: RE | Admit: 2023-12-30 | Discharge: 2023-12-30 | Disposition: A | Discharge status: Home / Self Care | Source: Ambulatory Visit | Attending: Family Medicine | Admitting: Family Medicine

## 2023-12-30 DIAGNOSIS — M545 Low back pain, unspecified: Secondary | ICD-10-CM

## 2023-12-30 DIAGNOSIS — M5116 Intervertebral disc disorders with radiculopathy, lumbar region: Secondary | ICD-10-CM | POA: Diagnosis not present

## 2023-12-30 MED ORDER — IOPAMIDOL (ISOVUE-M 200) INJECTION 41%
1.0000 mL | Freq: Once | INTRAMUSCULAR | Status: AC
  Administered 2023-12-30: 1 mL via EPIDURAL

## 2023-12-30 MED ORDER — METHYLPREDNISOLONE ACETATE 40 MG/ML INJ SUSP (RADIOLOG
80.0000 mg | Freq: Once | INTRAMUSCULAR | Status: AC
  Administered 2023-12-30: 80 mg via EPIDURAL

## 2023-12-31 ENCOUNTER — Encounter: Admitting: Physical Therapy

## 2024-01-04 ENCOUNTER — Ambulatory Visit

## 2024-01-04 DIAGNOSIS — M542 Cervicalgia: Secondary | ICD-10-CM | POA: Diagnosis not present

## 2024-01-04 DIAGNOSIS — S134XXA Sprain of ligaments of cervical spine, initial encounter: Secondary | ICD-10-CM | POA: Diagnosis not present

## 2024-01-04 DIAGNOSIS — M545 Low back pain, unspecified: Secondary | ICD-10-CM | POA: Diagnosis not present

## 2024-01-04 DIAGNOSIS — M62838 Other muscle spasm: Secondary | ICD-10-CM

## 2024-01-04 DIAGNOSIS — M6281 Muscle weakness (generalized): Secondary | ICD-10-CM | POA: Diagnosis not present

## 2024-01-04 DIAGNOSIS — M5459 Other low back pain: Secondary | ICD-10-CM | POA: Diagnosis not present

## 2024-01-04 NOTE — Therapy (Signed)
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 on eval, up to 7/10 at worst; Low back - 2/10 on eval, up to 9/10 at worst Goal status: INITIAL  3.  Patient will demonstrate improved posture to decrease muscle imbalance. Baseline:  Goal status: INITIAL  4.  Patient to demonstrate ability to achieve and maintain good spinal alignment and body mechanics needed for daily activities. Baseline:  Goal status: INITIAL  5.  Patient will demonstrate functional pain free cervical ROM for safety with driving.  Baseline: Refer to above cervical ROM table Goal status: INITIAL  6.  Patient will demonstrate functional pain free lumbar ROM to perform ADLs.   Baseline: Refer to above lumbar ROM table Goal status: INITIAL  7.  Patient will  demonstrate improved functional strength as demonstrated by improved scapular and LE strength to >/= 4+/5. Baseline: Refer to above UE/LE MMT tables Goal status: INITIAL  8.  Patient will report </= 12% on NDI (MCID = 10%) to demonstrate improved functional ability.  Baseline: 11 / 50 = 22.0 % Goal status: IN PROGRESS 01/04/24 - 9 / 50 = 18.0 %  9.  Patient will report </= 35% on Modified Oswestry (MCID = 12.8%) to demonstrate improved functional ability.  Baseline: 24 / 50 = 48.0 % Goal status: INITIAL  10.  Patient to report ability to perform ADLs, household, and leisure-related tasks without limitation due to neck or low back pain, LOM or weakness Baseline:  Goal status: INITIAL   11.  Patient will tolerate 10-20 min of standing to perform self-care and ADLs. Baseline: unable to stand to brush teeth w/o severe LBP Goal status: INITIAL   PLAN:  PT FREQUENCY: 2x/week  PT DURATION: 8-12 weeks  PLANNED INTERVENTIONS: 97164- PT Re-evaluation, 97750- Physical Performance Testing, 97110-Therapeutic exercises, 97530- Therapeutic activity, 97112- Neuromuscular re-education, 97535- Self Care, 02859- Manual therapy, [phone]- Gait training, [phone]- Aquatic Therapy, [phone]- Electrical stimulation (unattended), [phone]- Ultrasound, 02987- Traction (mechanical), 20560 (1-2 muscles), 20561 (3+ muscles)- Dry Needling, Patient/Family education, Balance training, Taping, Joint mobilization, Spinal mobilization, Cryotherapy, and Moist heat  PLAN FOR NEXT SESSION: gentle cervical AA/AROM; postural strengthening as well as progress core/lumbopelvic flexibility and stabilization/strengthening   Sereena Marando L Anan Dapolito, PTA 01/04/2024, 4:50 PM

## 2024-01-06 DIAGNOSIS — E291 Testicular hypofunction: Secondary | ICD-10-CM | POA: Diagnosis not present

## 2024-01-11 ENCOUNTER — Ambulatory Visit

## 2024-01-11 DIAGNOSIS — M6281 Muscle weakness (generalized): Secondary | ICD-10-CM | POA: Insufficient documentation

## 2024-01-11 DIAGNOSIS — M5459 Other low back pain: Secondary | ICD-10-CM | POA: Insufficient documentation

## 2024-01-11 DIAGNOSIS — M542 Cervicalgia: Secondary | ICD-10-CM | POA: Insufficient documentation

## 2024-01-11 DIAGNOSIS — M62838 Other muscle spasm: Secondary | ICD-10-CM | POA: Diagnosis not present

## 2024-01-11 NOTE — Therapy (Signed)
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 overall pain since start of PT  3.  Patient to verbalize understanding of proper posture and body mechanics to achieve and maintain good spinal alignment needed for daily activities to reduce pain/muscle strain. Baseline: education initiated on eval Goal status: MET - 12/22/23    LONG TERM GOALS: Target date: 03/02/2024  Patient will be independent with ongoing/advanced HEP for self-management at home.  Baseline:  Goal status: INITIAL  2.  Patient will report 50-75% improvement in neck and low back pain to improve QOL.  Baseline: Neck - 4/10 on eval, up to 7/10 at worst; Low back - 2/10 on eval, up to 9/10 at worst Goal status: INITIAL  3.  Patient will demonstrate improved posture to decrease muscle  imbalance. Baseline:  Goal status: INITIAL  4.  Patient to demonstrate ability to achieve and maintain good spinal alignment and body mechanics needed for daily activities. Baseline:  Goal status: INITIAL  5.  Patient will demonstrate functional pain free cervical ROM for safety with driving.  Baseline: Refer to above cervical ROM table Goal status: IN PROGRESS- assessed 12/24/23- updated 01/11/24  6.  Patient will demonstrate functional pain free lumbar ROM to perform ADLs.   Baseline: Refer to above lumbar ROM table Goal status: IN PROGRESS- 01/11/24  7.  Patient will demonstrate improved functional strength as demonstrated by improved scapular and LE strength to >/= 4+/5. Baseline: Refer to above UE/LE MMT tables Goal status: IN PROGRESS- 01/11/24  8.  Patient will report </= 12% on NDI (MCID = 10%) to demonstrate improved functional ability.  Baseline: 11 / 50 = 22.0 % Goal status: IN PROGRESS 01/04/24 - 9 / 50 = 18.0 %  9.  Patient will report </= 35% on Modified Oswestry (MCID = 12.8%) to demonstrate improved functional ability.  Baseline: 24 / 50 = 48.0 % Goal status: INITIAL  10.  Patient to report ability to perform ADLs, household, and leisure-related tasks without limitation due to neck or low back pain, LOM or weakness Baseline:  Goal status: INITIAL   11.  Patient will tolerate 10-20 min of standing to perform self-care and ADLs. Baseline: unable to stand to brush teeth w/o severe LBP Goal status: INITIAL   PLAN:  PT FREQUENCY: 2x/week  PT DURATION: 8-12 weeks  PLANNED INTERVENTIONS: 97164- PT Re-evaluation, 97750- Physical Performance Testing, 97110-Therapeutic exercises, 97530- Therapeutic activity, 97112- Neuromuscular re-education, 97535- Self Care, 02859- Manual therapy, [phone]- Gait training, [phone]- Aquatic Therapy, [phone]- Electrical stimulation (unattended), [phone]- Ultrasound, 02987- Traction (mechanical), 20560 (1-2 muscles), 20561 (3+ muscles)- Dry  Needling, Patient/Family education, Balance training, Taping, Joint mobilization, Spinal mobilization, Cryotherapy, and Moist heat  PLAN FOR NEXT SESSION: gentle cervical AA/AROM; postural strengthening as well as progress core/lumbopelvic flexibility and stabilization/strengthening   Leith Hedlund L Loyalty Brashier, PTA 01/11/2024, 5:14 PM

## 2024-01-12 ENCOUNTER — Ambulatory Visit: Payer: BC Managed Care – PPO

## 2024-01-12 DIAGNOSIS — I480 Paroxysmal atrial fibrillation: Secondary | ICD-10-CM

## 2024-01-12 LAB — CUP PACEART REMOTE DEVICE CHECK
Battery Remaining Longevity: 101 mo
Battery Voltage: 2.99 V
Brady Statistic AP VP Percent: 1.08 %
Brady Statistic AP VS Percent: 0 %
Brady Statistic AS VP Percent: 97.59 %
Brady Statistic AS VS Percent: 1.23 %
Brady Statistic RA Percent Paced: 1.04 %
Brady Statistic RV Percent Paced: 96.28 %
Date Time Interrogation Session: 20251103205824
Implantable Lead Connection Status: 753985
Implantable Lead Connection Status: 753985
Implantable Lead Implant Date: 20230202
Implantable Lead Implant Date: 20230202
Implantable Lead Location: 753859
Implantable Lead Location: 753860
Implantable Lead Model: 3830
Implantable Lead Model: 5076
Implantable Pulse Generator Implant Date: 20230202
Lead Channel Impedance Value: 342 Ohm
Lead Channel Impedance Value: 361 Ohm
Lead Channel Impedance Value: 418 Ohm
Lead Channel Impedance Value: 475 Ohm
Lead Channel Pacing Threshold Amplitude: 0.5 V
Lead Channel Pacing Threshold Amplitude: 1.25 V
Lead Channel Pacing Threshold Pulse Width: 0.4 ms
Lead Channel Pacing Threshold Pulse Width: 0.4 ms
Lead Channel Sensing Intrinsic Amplitude: 4.5 mV
Lead Channel Sensing Intrinsic Amplitude: 4.5 mV
Lead Channel Sensing Intrinsic Amplitude: 4.875 mV
Lead Channel Sensing Intrinsic Amplitude: 4.875 mV
Lead Channel Setting Pacing Amplitude: 1.5 V
Lead Channel Setting Pacing Amplitude: 2.5 V
Lead Channel Setting Pacing Pulse Width: 0.4 ms
Lead Channel Setting Sensing Sensitivity: 1.2 mV
Zone Setting Status: 755011

## 2024-01-14 ENCOUNTER — Ambulatory Visit

## 2024-01-15 NOTE — Progress Notes (Signed)
 Remote PPM Transmission

## 2024-01-19 ENCOUNTER — Ambulatory Visit: Payer: Self-pay | Admitting: Cardiology

## 2024-01-19 ENCOUNTER — Ambulatory Visit: Admitting: Family Medicine

## 2024-01-19 VITALS — BP 114/60 | HR 84 | Ht 69.0 in | Wt 217.0 lb

## 2024-01-19 DIAGNOSIS — E1122 Type 2 diabetes mellitus with diabetic chronic kidney disease: Secondary | ICD-10-CM | POA: Diagnosis not present

## 2024-01-19 DIAGNOSIS — M5416 Radiculopathy, lumbar region: Secondary | ICD-10-CM | POA: Diagnosis not present

## 2024-01-19 DIAGNOSIS — I129 Hypertensive chronic kidney disease with stage 1 through stage 4 chronic kidney disease, or unspecified chronic kidney disease: Secondary | ICD-10-CM | POA: Diagnosis not present

## 2024-01-19 DIAGNOSIS — M545 Low back pain, unspecified: Secondary | ICD-10-CM

## 2024-01-19 DIAGNOSIS — N1831 Chronic kidney disease, stage 3a: Secondary | ICD-10-CM | POA: Diagnosis not present

## 2024-01-19 DIAGNOSIS — N179 Acute kidney failure, unspecified: Secondary | ICD-10-CM | POA: Diagnosis not present

## 2024-01-19 NOTE — Patient Instructions (Signed)
 [facility] [phone] Keep being active and keep doing exercises See you again 2 months after injection

## 2024-01-19 NOTE — Assessment & Plan Note (Addendum)
 Patient had made improvement but now only 50% better than where he was.  Initially after the injection was closer to 90% better.  Do feel that patient could respond well to another injection.  Continue the gabapentin .  Follow-up after injection 8 weeks

## 2024-01-19 NOTE — Progress Notes (Signed)
 [facility] [address] [place] [address] Phone: [phone] Subjective:   LILLETTE Claretha Schimke am a scribe for Dr. Claudene.   I'm seeing this patient by the request  of:  Gerome Brunet, DO  CC: Low back pain follow-up  YEP:Dlagzrupcz  11/19/2023 Continue to follow-up with other providers, he is over 3 months out though so should be able to start increasing activity even more.  Chronic problem with exacerbation.  Seems to have more of a left-sided lumbar radiculopathy that is aggravated.  Did do see when I look at his x-rays there is a questionable fracture of the DISH at the L4-L5 area.  Patient's symptoms previously seem to be more at the L3 nerve root but I do think that there is actually more at the L4-L5 with the discomfort at this time.  I would consider the possibility of a an injection at this level which patient is willing to do as well as starting on formal physical therapy for the modalities such as dry needling, TENS unit as well.  Follow-up again in 6 to 8 weeks otherwise.   Updated 01/19/2024 Burleigh Brockmann. is a 73 y.o. male coming in with complaint of back pain. Epidural on 12/30/2023 at the L4-L5 level, wants to continue conservative therapy otherwise.  Patient states that the back still hurts but he thinks that the PT and the injection that he got helped It is 50% better.        Past Medical History:  Diagnosis Date   Complication of anesthesia    ANESTHESIA AWARENESS  DURING ADRENALECTOMY AND COLONOSCOPY   Diabetes mellitus    on oral meds-no insulin    DIABETES MELLITUS, TYPE II, UNCONTROLLED 06/22/2006   GILBERT'S SYNDROME 02/25/2007   GOUT 06/22/2006   HEMOCHROMATOSIS, HX OF 12/15/2005   phlebotomy every 6 to 8 weeks--at [facility]   HIV disease (HCC) 1990   DR. HATCHER WITH [facility]   HNP (herniated nucleus pulposus)    LUMBAR PAIN WITH PAIN DOWN LEFT LEG TO TOES-SOME NUMBNESS AND  TINGLING IN BIG TOE   Hypertension    Past Surgical History:  Procedure Laterality Date   ADRENALECTOMY     RIGHT ADRENALECTOMY    CHOLECYSTECTOMY     LUMBAR LAMINECTOMY/DECOMPRESSION MICRODISCECTOMY  04/23/2011   Procedure: LUMBAR LAMINECTOMY/DECOMPRESSION MICRODISCECTOMY;  Surgeon: Reyes JAYSON Billing, MD;  Location: [facility];  Service: Orthopedics;  Laterality: N/A;  Decompression L5 S1 (X-Ray)   ORIF LEFT SHOULDER     STILL HAS HARDWARE IN THE SHOULDER   PACEMAKER IMPLANT N/A 04/11/2021   Procedure: PACEMAKER IMPLANT;  Surgeon: Inocencio Soyla Lunger, MD;  Location: [facility];  Service: Cardiovascular;  Laterality: N/A;   Social History   Socioeconomic History   Marital status: Single    Spouse name: Not on file   Number of children: 0   Years of education: Not on file   Highest education level: Bachelor's degree (e.g., BA, AB, BS)  Occupational History   Not on file  Tobacco Use   Smoking status: Never   Smokeless tobacco: Never  Vaping Use   Vaping status: Never Used  Substance and Sexual Activity   Alcohol use: No    Alcohol/week: 0.0 standard drinks of alcohol   Drug use: No   Sexual activity: Never    Partners: Male    Comment: pt. declined condoms  Other Topics Concern   Not on file  Social History  Narrative   Not on file   Social Drivers of Health   Financial Resource Strain: Low Risk  (12/03/2023)   Overall Financial Resource Strain (CARDIA)    Difficulty of Paying Living Expenses: Not very hard  Food Insecurity: No Food Insecurity (12/03/2023)   Hunger Vital Sign    Worried About Running Out of Food in the Last Year: Never true    Ran Out of Food in the Last Year: Never true  Transportation Needs: No Transportation Needs (12/03/2023)   PRAPARE - Administrator, Civil Service (Medical): No    Lack of Transportation (Non-Medical): No  Physical Activity: Insufficiently Active (12/03/2023)   Exercise Vital Sign    Days of Exercise per Week: 1 day     Minutes of Exercise per Session: 10 min  Stress: No Stress Concern Present (12/03/2023)   [employer] of Occupational Health - Occupational Stress Questionnaire    Feeling of Stress: Only a little  Social Connections: Socially Isolated (12/03/2023)   Social Connection and Isolation Panel    Frequency of Communication with Friends and Family: More than three times a week    Frequency of Social Gatherings with Friends and Family: Three times a week    Attends Religious Services: Patient declined    Active Member of Clubs or Organizations: No    Attends Engineer, Structural: Not on file    Marital Status: Never married   Allergies  Allergen Reactions   Hydromorphone  Itching and Rash   Scopolamine     GIVEN WITH A SURGERY YRS AGO--CAUSED HALLUNCINATIONS Other reaction(s): Confusion/Altered Mental Status Given during Surgery many years ago - Caused Hallucinations GIVEN WITH A SURGERY YRS AGO--CAUSED HALLUNCINATIONS GIVEN WITH A SURGERY YRS AGO--CAUSED HALLUNCINATIONS   Beta Adrenergic Blockers Other (See Comments)    Wenchebach--slowed heart rhythm  Other reaction(s): patient has Wenchebach   Escitalopram  Oxalate Rash and Other (See Comments)    lightheaded,  visual  disturbances   Codeine Itching   Morphine Sulfate Rash   Family History  Problem Relation Age of Onset   Hypertension Mother    Hyperlipidemia Mother    Atrial fibrillation Mother    Hyperlipidemia Father    Hypertension Father    Atrial fibrillation Father     Current Outpatient Medications (Endocrine & Metabolic):    Dapagliflozin -metFORMIN  HCl ER (XIGDUO XR) 12-998 MG TB24, Take 1 tablet by mouth daily.   MOUNJARO 5 MG/0.5ML Pen, Inject 5 mg into the skin once a week. Inject on Sunday (Patient taking differently: Inject 7.5 mg into the skin once a week. Inject on Sunday)   testosterone  cypionate (DEPOTESTOSTERONE CYPIONATE) 200 MG/ML injection, Inject 200 mg into the muscle every 14 (fourteen)  days.  Current Outpatient Medications (Cardiovascular):    amLODipine  (NORVASC ) 5 MG tablet, Take 1 tablet (5 mg total) by mouth daily.   atorvastatin  (LIPITOR) 10 MG tablet, Take 10 mg by mouth at bedtime.   carvedilol  (COREG ) 25 MG tablet, TAKE 1 TABLET(25 MG) BY MOUTH TWICE DAILY WITH A MEAL   cholestyramine (QUESTRAN) 4 GM/DOSE powder, Take 4 g by mouth 3 (three) times daily with meals as needed (diarrhea).   furosemide  (LASIX ) 20 MG tablet, Take 20 mg by mouth in the morning.   hydrALAZINE  (APRESOLINE ) 25 MG tablet, Take 75 mg by mouth 3 (three) times daily.   telmisartan  (MICARDIS ) 80 MG tablet, Take 80 mg by mouth daily before breakfast.  Current Outpatient Medications (Respiratory):    fexofenadine (ALLEGRA) 180 MG  tablet, Take 180 mg by mouth in the morning.  Current Outpatient Medications (Analgesics):    acetaminophen  (TYLENOL ) 500 MG tablet, Take 1,000 mg by mouth every 6 (six) hours as needed (for pain.).   allopurinol  (ZYLOPRIM ) 300 MG tablet, Take 300 mg by mouth in the morning.  Current Outpatient Medications (Hematological):    apixaban  (ELIQUIS ) 5 MG TABS tablet, TAKE 1 TABLET(5 MG) BY MOUTH TWICE DAILY   Cyanocobalamin  1000 MCG/ML KIT, Inject 1,000 mcg as directed See admin instructions. Injection 1000mcg every two weeks.  Current Outpatient Medications (Other):    gabapentin  (NEURONTIN ) 300 MG capsule, Take 2 capsules (600 mg total) by mouth at bedtime.   ODEFSEY  200-25-25 MG TABS tablet, TAKE ONE TABLET DAILY WITH BREAKFAST   Omega-3 Fatty Acids (FISH OIL PO), Take 3 capsules by mouth at bedtime. 1400 mg each   senna-docusate (SENOKOT-S) 8.6-50 MG tablet, Take 2 tablets by mouth 2 (two) times daily between meals as needed for mild constipation or moderate constipation. (Patient not taking: Reported on 08/05/2023)   Tavaborole  (KERYDIN ) 5 % SOLN, Apply 1 drop topically daily. Apply 1 drop to the toenail daily.   traZODone  (DESYREL ) 50 MG tablet, Take 50-100 mg by mouth  at bedtime.   vitamin E 200 UNIT capsule, Take 200 Units by mouth daily.   Reviewed prior external information including notes and imaging from  primary care provider As well as notes that were available from care everywhere and other healthcare systems.  Past medical history, social, surgical and family history all reviewed in electronic medical record.  No pertanent information unless stated regarding to the chief complaint.   Review of Systems:  No headache, visual changes, nausea, vomiting, diarrhea, constipation, dizziness, abdominal pain, skin rash, fevers, chills, night sweats, weight loss, swollen lymph nodes, body aches, joint swelling, chest pain, shortness of breath, mood changes. POSITIVE muscle aches  Objective  Blood pressure 114/60, pulse 84, height 5' 9 (1.753 m), weight 217 lb (98.4 kg), SpO2 95%.   General: No apparent distress alert and oriented x3 mood and affect normal, dressed appropriately.  HEENT: Pupils equal, extraocular movements intact  Respiratory: Patient's speak in full sentences and does not appear short of breath  Cardiovascular: No lower extremity edema, non tender, no erythema  Low back exam shows still some mild loss of lordosis.  Improvement in range of motion some of the ankles bilaterally and legs with straight leg test.    Impression and Recommendations:     The above documentation has been reviewed and is accurate and complete Deara Bober M Marwa Fuhrman, DO

## 2024-01-20 ENCOUNTER — Ambulatory Visit

## 2024-01-20 ENCOUNTER — Telehealth (HOSPITAL_BASED_OUTPATIENT_CLINIC_OR_DEPARTMENT_OTHER): Payer: Self-pay

## 2024-01-20 DIAGNOSIS — M5459 Other low back pain: Secondary | ICD-10-CM | POA: Diagnosis not present

## 2024-01-20 DIAGNOSIS — M6281 Muscle weakness (generalized): Secondary | ICD-10-CM

## 2024-01-20 DIAGNOSIS — Z125 Encounter for screening for malignant neoplasm of prostate: Secondary | ICD-10-CM | POA: Diagnosis not present

## 2024-01-20 DIAGNOSIS — M62838 Other muscle spasm: Secondary | ICD-10-CM | POA: Diagnosis not present

## 2024-01-20 DIAGNOSIS — M542 Cervicalgia: Secondary | ICD-10-CM | POA: Diagnosis not present

## 2024-01-20 DIAGNOSIS — E291 Testicular hypofunction: Secondary | ICD-10-CM | POA: Diagnosis not present

## 2024-01-20 DIAGNOSIS — R7989 Other specified abnormal findings of blood chemistry: Secondary | ICD-10-CM | POA: Diagnosis not present
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
## 2024-01-20 NOTE — Telephone Encounter (Signed)
   Pre-operative Risk Assessment    Patient Name: [name].  DOB: Jul 18, 1950 MRN: [id number]   Date of last office visit: 08/17/23 with Dr. Inocencio Date of next office visit: NA   Request for Surgical Clearance    Procedure:  Lumbar ESI  Date of Surgery:  Clearance TBD                                 Surgeon:  Not indicated Surgeon's Group or Practice Name:  [facility] Phone number:  [phone] Fax number:  [phone]   Type of Clearance Requested:   - Medical  - Pharmacy:  Hold Apixaban  (Eliquis ) for 6 doses   Type of Anesthesia:  Not Indicated   Additional requests/questions:    Bonney Augustin JONETTA Delores   01/20/2024, 2:40 PM

## 2024-01-25 ENCOUNTER — Ambulatory Visit

## 2024-01-25 NOTE — Telephone Encounter (Signed)
 Patient with diagnosis of afib on Eliquis  for anticoagulation.    Procedure:  Lumbar ESI   Date of Surgery:  Clearance TBD    CHA2DS2-VASc Score = 3   This indicates a 3.2% annual risk of stroke. The patient's score is based upon: CHF History: 0 HTN History: 1 Diabetes History: 1 Stroke History: 0 Vascular Disease History: 0 Age Score: 1 Gender Score: 0    CrCl 41 ml/min  Platelet count 191 K  Patient has not had an Afib/aflutter ablation in the last 3 months, DCCV within the last 4 weeks or a watchman implanted in the last 45 days   Per office protocol, patient can hold Eliquis  for 3 days prior to procedure.   Patient will not need bridging with Lovenox  (enoxaparin ) around procedure.  **This guidance is not considered finalized until pre-operative APP has relayed final recommendations.**

## 2024-01-25 NOTE — Telephone Encounter (Signed)
   Name: [name].  DOB: 1950/05/14  MRN: [id number]  Primary Cardiologist: None   Preoperative team, please contact this patient and set up a phone call appointment for further preoperative risk assessment. Please obtain consent and complete medication review. Thank you for your help.  I confirm that guidance regarding antiplatelet and oral anticoagulation therapy has been completed and, if necessary, noted below.  Patient has not had an Afib/aflutter ablation in the last 3 months, DCCV within the last 4 weeks or a watchman implanted in the last 45 days    Per office protocol, patient can hold Eliquis  for 3 days prior to procedure.   Patient will not need bridging with Lovenox  (enoxaparin ) around procedure.  I also confirmed the patient resides in the state of  . As per [facility] [employer] telemedicine laws, the patient must reside in the state in which the provider is licensed.   Joshua CHRISTELLA Beauvais, NP 01/25/2024, 11:34 AM [facility]

## 2024-01-28 ENCOUNTER — Ambulatory Visit

## 2024-01-28 DIAGNOSIS — M62838 Other muscle spasm: Secondary | ICD-10-CM | POA: Diagnosis not present

## 2024-01-28 DIAGNOSIS — M5459 Other low back pain: Secondary | ICD-10-CM | POA: Diagnosis not present

## 2024-01-28 DIAGNOSIS — M6281 Muscle weakness (generalized): Secondary | ICD-10-CM

## 2024-01-28 DIAGNOSIS — M542 Cervicalgia: Secondary | ICD-10-CM

## 2024-01-28 NOTE — Therapy (Addendum)
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 01/28/24 can stand for 10 min   PLAN:  PT FREQUENCY: 2x/week  PT DURATION: 8-12 weeks  PLANNED INTERVENTIONS: 97164- PT Re-evaluation, 97750- Physical Performance Testing, 97110-Therapeutic exercises, 97530- Therapeutic activity, 97112- Neuromuscular re-education, 97535- Self Care, 02859- Manual therapy, [phone]- Gait training, [phone]- Aquatic Therapy, [phone]- Electrical stimulation (unattended), [phone]- Ultrasound, 02987- Traction (mechanical), 20560 (1-2 muscles), 20561 (3+ muscles)- Dry Needling, Patient/Family education, Balance training, Taping, Joint mobilization, Spinal mobilization, Cryotherapy, and Moist heat  PLAN FOR NEXT SESSION: postural strengthening and mobility as well as progress  core/lumbopelvic flexibility and stabilization/strengthening; work on gym equipment ( he has most machines at clubhouse in neighborhood)   [name], [name] 01/28/2024, 5:32 PM   Joshua Layman Raddle. Remains very stiff with only minimal change in cervical or lumbar ROM since eval, with rigid posture evident in cervical and lumbar spine, and little to no trunk movement during ambulation.  He reports some improvement in low back pain but less change noted in cervical pain with modified Oswestry and NDI reflecting functional activity tolerance corresponding with pain ratings.  [name] has partially met his STG's and is demonstrating progress towards his LTG's.  He will benefit from continued skilled PT to address ongoing ROM/flexibility, spinal mobility, and strength deficits to improve mobility and activity tolerance with decreased pain interference.    [name], PT 01/28/2024, 5:48 PM  [facility] [address]  [address] [place], [place], [address] Phone: [phone]   Fax:  [phone]

## 2024-01-29 DIAGNOSIS — M109 Gout, unspecified: Secondary | ICD-10-CM | POA: Diagnosis not present

## 2024-02-01 ENCOUNTER — Ambulatory Visit

## 2024-02-01 ENCOUNTER — Telehealth (HOSPITAL_BASED_OUTPATIENT_CLINIC_OR_DEPARTMENT_OTHER): Payer: Self-pay | Admitting: *Deleted

## 2024-02-01 DIAGNOSIS — M6281 Muscle weakness (generalized): Secondary | ICD-10-CM | POA: Diagnosis not present

## 2024-02-01 DIAGNOSIS — M62838 Other muscle spasm: Secondary | ICD-10-CM | POA: Diagnosis not present

## 2024-02-01 DIAGNOSIS — M5459 Other low back pain: Secondary | ICD-10-CM

## 2024-02-01 DIAGNOSIS — M542 Cervicalgia: Secondary | ICD-10-CM | POA: Diagnosis not present

## 2024-02-01 NOTE — Telephone Encounter (Signed)
 Patient is calling to follow up on getting clearance. Please advise.

## 2024-02-01 NOTE — Telephone Encounter (Signed)
 Pt has been scheduled tele preop appt 02/10/24. Surgeon waiting on clearance before scheduling procedure. Med rec and consent are done.       Patient Consent for Virtual Visit        [name]. has provided verbal consent on 02/01/2024 for a virtual visit (video or telephone).   CONSENT FOR VIRTUAL VISIT FOR:  [name].  By participating in this virtual visit I agree to the following:  I hereby voluntarily request, consent and authorize [facility] and its employed or contracted physicians, physician assistants, nurse practitioners or other licensed health care professionals (the Practitioner), to provide me with telemedicine health care services (the "Services) as deemed necessary by the treating Practitioner. I acknowledge and consent to receive the Services by the Practitioner via telemedicine. I understand that the telemedicine visit will involve communicating with the Practitioner through live audiovisual communication technology and the disclosure of certain medical information by electronic transmission. I acknowledge that I have been given the opportunity to request an in-person assessment or other available alternative prior to the telemedicine visit and am voluntarily participating in the telemedicine visit.  I understand that I have the right to withhold or withdraw my consent to the use of telemedicine in the course of my care at any time, without affecting my right to future care or treatment, and that the Practitioner or I may terminate the telemedicine visit at any time. I understand that I have the right to inspect all information obtained and/or recorded in the course of the telemedicine visit and may receive copies of available information for a reasonable fee.  I understand that some of the potential risks of receiving the Services via telemedicine include:  Delay or interruption in medical evaluation due to technological equipment failure or  disruption; Information transmitted may not be sufficient (e.g. poor resolution of images) to allow for appropriate medical decision making by the Practitioner; and/or  In rare instances, security protocols could fail, causing a breach of personal health information.  Furthermore, I acknowledge that it is my responsibility to provide information about my medical history, conditions and care that is complete and accurate to the best of my ability. I acknowledge that Practitioner's advice, recommendations, and/or decision may be based on factors not within their control, such as incomplete or inaccurate data provided by me or distortions of diagnostic images or specimens that may result from electronic transmissions. I understand that the practice of medicine is not an exact science and that Practitioner makes no warranties or guarantees regarding treatment outcomes. I acknowledge that a copy of this consent can be made available to me via my patient portal [facility] MyChart), or I can request a printed copy by calling the office of [facility].    I understand that my insurance will be billed for this visit.   I have read or had this consent read to me. I understand the contents of this consent, which adequately explains the benefits and risks of the Services being provided via telemedicine.  I have been provided ample opportunity to ask questions regarding this consent and the Services and have had my questions answered to my satisfaction. I give my informed consent for the services to be provided through the use of telemedicine in my medical care

## 2024-02-01 NOTE — Telephone Encounter (Signed)
 Left patient a detailed message to get scheduled for pre-op clearance. First attempt.

## 2024-02-01 NOTE — Telephone Encounter (Signed)
 Pt is calling to f/u please advise

## 2024-02-01 NOTE — Therapy (Signed)
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 unable to stand to brush teeth w/o severe LBP Goal status: IN PROGRESS- 01/28/24 can stand for 10 min   PLAN:  PT FREQUENCY: 2x/week  PT DURATION: 8-12 weeks  PLANNED INTERVENTIONS: 97164- PT Re-evaluation, 97750- Physical Performance Testing, 97110-Therapeutic exercises, 97530- Therapeutic activity, 97112- Neuromuscular re-education, 97535- Self Care, 02859- Manual therapy, [phone]- Gait training, [phone]- Aquatic Therapy, [phone]- Electrical stimulation (unattended), 97035- Ultrasound, 02987- Traction (mechanical), 20560 (1-2 muscles), 20561 (3+ muscles)- Dry Needling, Patient/Family education, Balance training, Taping, Joint mobilization, Spinal mobilization, Cryotherapy, and Moist heat  PLAN FOR NEXT SESSION: postural strengthening and  mobility as well as progress core/lumbopelvic flexibility and stabilization/strengthening; work on gym equipment ( he has most machines at clubhouse in neighborhood)   Sol LITTIE Gaskins, PTA 02/01/2024, 4:31 PM     [facility] [address]  [address] [place], [place], [address] Phone: [phone]   Fax:  [phone]

## 2024-02-01 NOTE — Telephone Encounter (Signed)
 Pt has been scheduled tele preop appt 02/10/24. Surgeon waiting on clearance before scheduling procedure. Med rec and consent are done.

## 2024-02-03 DIAGNOSIS — R7989 Other specified abnormal findings of blood chemistry: Secondary | ICD-10-CM | POA: Diagnosis not present

## 2024-02-08 ENCOUNTER — Ambulatory Visit: Admitting: Physical Therapy

## 2024-02-10 ENCOUNTER — Ambulatory Visit: Attending: Cardiology | Admitting: Nurse Practitioner

## 2024-02-10 DIAGNOSIS — Z01810 Encounter for preprocedural cardiovascular examination: Secondary | ICD-10-CM

## 2024-02-10 NOTE — Progress Notes (Signed)
 Virtual Visit via Telephone Note   Because of [name]. co-morbid illnesses, he is at least at moderate risk for complications without adequate follow up.  This format is felt to be most appropriate for this patient at this time.  Due to technical limitations with video connection (technology), today's appointment will be conducted as an audio only telehealth visit, and [name]. verbally agreed to proceed in this manner.   All issues noted in this document were discussed and addressed.  No physical exam could be performed with this format.  Evaluation Performed:  Preoperative cardiovascular risk assessment _____________   Date:  02/10/2024   Patient ID:  [name]., DOB 27-Jan-1951, MRN [id number] Patient Location:  Home Provider location:   Office  Primary Care Provider:  Gerome Brunet, DO Primary Cardiologist:  None  Chief Complaint / Patient Profile   73 y.o. y/o male with a h/o paroxysmal atrial fibrillation, second-degree AV block s/p PPM, hypertension, type 2 diabetes, CKD, HIV, hemochromatosis, and gout  who is pending lumbar ESI with [facility] imaging of [name] and presents today for telephonic preoperative cardiovascular risk assessment.  History of Present Illness    [name]. is a 73 y.o. male who presents via audio/video conferencing for a telehealth visit today.  Pt was last seen in cardiology clinic on 08/17/2023 by Dr. Inocencio.  He was seen virtually on 12/01/2023. At that time [name]. was doing well.  The patient is now pending procedure as outlined above. Since his last visit, he has done well from a cardiac standpoint.   He denies chest pain, palpitations, dyspnea, pnd, orthopnea, n, v, dizziness, syncope, edema, weight gain, or early satiety. All other systems reviewed and are otherwise negative except as noted above.   Past Medical History    Past Medical History:  Diagnosis Date   Complication of anesthesia     ANESTHESIA AWARENESS  DURING ADRENALECTOMY AND COLONOSCOPY   Diabetes mellitus    on oral meds-no insulin    DIABETES MELLITUS, TYPE II, UNCONTROLLED 06/22/2006   GILBERT'S SYNDROME 02/25/2007   GOUT 06/22/2006   HEMOCHROMATOSIS, HX OF 12/15/2005   phlebotomy every 6 to 8 weeks--at [facility]   HIV disease (HCC) 1990   [facility] HATCHER [facility]   HNP (herniated nucleus pulposus)    LUMBAR PAIN WITH PAIN DOWN LEFT LEG TO TOES-SOME NUMBNESS AND TINGLING IN BIG TOE   Hypertension    Past Surgical History:  Procedure Laterality Date   ADRENALECTOMY     RIGHT ADRENALECTOMY    CHOLECYSTECTOMY     LUMBAR LAMINECTOMY/DECOMPRESSION MICRODISCECTOMY  04/23/2011   Procedure: LUMBAR LAMINECTOMY/DECOMPRESSION MICRODISCECTOMY;  Surgeon: Reyes JAYSON Billing, MD;  Location: [facility];  Service: Orthopedics;  Laterality: N/A;  Decompression L5 S1 (X-Ray)   ORIF LEFT SHOULDER     STILL HAS HARDWARE IN THE SHOULDER   PACEMAKER IMPLANT N/A 04/11/2021   Procedure: PACEMAKER IMPLANT;  Surgeon: Inocencio Soyla Lunger, MD;  Location: [facility];  Service: Cardiovascular;  Laterality: N/A;    Allergies  Allergies  Allergen Reactions   Hydromorphone  Itching and Rash   Scopolamine     GIVEN WITH A SURGERY YRS AGO--CAUSED HALLUNCINATIONS Other reaction(s): Confusion/Altered Mental Status Given during Surgery many years ago - Caused Hallucinations GIVEN WITH A SURGERY YRS AGO--CAUSED HALLUNCINATIONS GIVEN WITH A SURGERY YRS AGO--CAUSED HALLUNCINATIONS   Beta Adrenergic Blockers Other (See Comments)    Wenchebach--slowed heart rhythm  Other reaction(s):  patient has Loraine   Escitalopram  Oxalate Rash and Other (See Comments)    lightheaded,  visual  disturbances   Codeine Itching   Morphine Sulfate Rash    Home Medications    Prior to Admission medications   Medication Sig Start Date End Date Taking? Authorizing Provider  acetaminophen  (TYLENOL ) 500 MG tablet  Take 1,000 mg by mouth every 6 (six) hours as needed (for pain.).    [provider]  allopurinol  (ZYLOPRIM ) 300 MG tablet Take 300 mg by mouth in the morning.    [provider]  amLODipine  (NORVASC ) 5 MG tablet Take 1 tablet (5 mg total) by mouth daily. 04/09/21   Camnitz, Soyla Lunger, MD  apixaban  (ELIQUIS ) 5 MG TABS tablet TAKE 1 TABLET(5 MG) BY MOUTH TWICE DAILY 11/20/23   Camnitz, Soyla Lunger, MD  atorvastatin  (LIPITOR) 10 MG tablet Take 10 mg by mouth at bedtime. 10/01/19   [provider]  carvedilol  (COREG ) 25 MG tablet TAKE 1 TABLET(25 MG) BY MOUTH TWICE DAILY WITH A MEAL 05/05/22   Camnitz, Soyla Lunger, MD  cholestyramine ORMA) 4 GM/DOSE powder Take 4 g by mouth 3 (three) times daily with meals as needed (diarrhea). 03/12/22   [provider]  Cyanocobalamin  1000 MCG/ML KIT Inject 1,000 mcg as directed See admin instructions. Injection 1000mcg every two weeks.    [provider]  Dapagliflozin -metFORMIN  HCl ER (XIGDUO XR) 12-998 MG TB24 Take 1 tablet by mouth daily.    [provider]  fexofenadine (ALLEGRA) 180 MG tablet Take 180 mg by mouth in the morning.    [provider]  furosemide  (LASIX ) 20 MG tablet Take 20 mg by mouth in the morning. 04/29/19   [provider]  gabapentin  (NEURONTIN ) 300 MG capsule Take 2 capsules (600 mg total) by mouth at bedtime. 10/14/23   Smith, Zachary M, DO  hydrALAZINE  (APRESOLINE ) 25 MG tablet Take 75 mg by mouth 3 (three) times daily. 12/07/19   [provider]  MOUNJARO 5 MG/0.5ML Pen Inject 5 mg into the skin once a week. Inject on Sunday Patient taking differently: Inject 7.5 mg into the skin once a week. Inject on Sunday 07/20/23   [provider]  ODEFSEY  200-25-25 MG TABS tablet TAKE ONE TABLET DAILY WITH BREAKFAST 10/06/23   Eben Reyes BROCKS, MD  Omega-3 Fatty Acids (FISH OIL PO) Take 3 capsules by mouth at bedtime. 1400 mg each    [provider]   senna-docusate (SENOKOT-S) 8.6-50 MG tablet Take 2 tablets by mouth 2 (two) times daily between meals as needed for mild constipation or moderate constipation. Patient not taking: Reported on 08/05/2023 08/04/23   Gonfa, Taye T, MD  Tavaborole  (KERYDIN ) 5 % SOLN Apply 1 drop topically daily. Apply 1 drop to the toenail daily. 12/23/22   Gershon Donnice SAUNDERS, DPM  telmisartan  (MICARDIS ) 80 MG tablet Take 80 mg by mouth daily before breakfast.    [provider]  testosterone  cypionate (DEPOTESTOSTERONE CYPIONATE) 200 MG/ML injection Inject 200 mg into the muscle every 14 (fourteen) days. 07/08/19   [provider]  traZODone  (DESYREL ) 50 MG tablet Take 50-100 mg by mouth at bedtime. 07/23/23   [provider]  vitamin E 200 UNIT capsule Take 200 Units by mouth daily.    [provider]    Physical Exam    Vital Signs:  Joshua Muhammed. does not have vital signs available for review today.  Given telephonic nature of communication, physical exam is limited. AAOx3. NAD.  Normal affect.  Speech and respirations are unlabored.  Accessory Clinical Findings    None  Assessment & Plan    1.  Preoperative Cardiovascular Risk Assessment:  According to the Revised Cardiac Risk Index (RCRI), his Perioperative Risk of Major Cardiac Event is (%): 0.4. His Functional Capacity in METs is: 7.01 according to the [facility] Activity Status Index (DASI).Therefore, based on ACC/AHA guidelines, patient would be at acceptable risk for the planned procedure without further cardiovascular testing.   The patient was advised that if he develops new symptoms prior to surgery to contact our office to arrange for a follow-up visit, and he verbalized understanding.   Per office protocol, patient can hold Eliquis  for 3 days prior to procedure.   Patient will not need bridging with Lovenox  (enoxaparin ) around procedure. Please resume Eliquis  as soon as possible postprocedure, at the  discretion of the surgeon.   A copy of this note will be routed to requesting surgeon.  Time:   Today, I have spent 5 minutes with the patient with telehealth technology discussing medical history, symptoms, and management plan.     [name], NP  02/10/2024, 2:47 PM

## 2024-02-11 ENCOUNTER — Ambulatory Visit: Admitting: Physical Therapy

## 2024-02-16 ENCOUNTER — Ambulatory Visit

## 2024-02-16 DIAGNOSIS — E1122 Type 2 diabetes mellitus with diabetic chronic kidney disease: Secondary | ICD-10-CM | POA: Diagnosis not present

## 2024-02-16 DIAGNOSIS — Z1509 Genetic susceptibility to other malignant neoplasm: Secondary | ICD-10-CM | POA: Diagnosis not present

## 2024-02-16 DIAGNOSIS — Z800 Family history of malignant neoplasm of digestive organs: Secondary | ICD-10-CM | POA: Diagnosis not present

## 2024-02-16 DIAGNOSIS — C184 Malignant neoplasm of transverse colon: Secondary | ICD-10-CM | POA: Diagnosis not present

## 2024-02-16 DIAGNOSIS — Z9049 Acquired absence of other specified parts of digestive tract: Secondary | ICD-10-CM | POA: Diagnosis not present

## 2024-02-16 DIAGNOSIS — Z7984 Long term (current) use of oral hypoglycemic drugs: Secondary | ICD-10-CM | POA: Diagnosis not present

## 2024-02-16 DIAGNOSIS — Z85038 Personal history of other malignant neoplasm of large intestine: Secondary | ICD-10-CM | POA: Diagnosis not present

## 2024-02-16 DIAGNOSIS — E1165 Type 2 diabetes mellitus with hyperglycemia: Secondary | ICD-10-CM | POA: Diagnosis not present

## 2024-02-16 DIAGNOSIS — Z7901 Long term (current) use of anticoagulants: Secondary | ICD-10-CM | POA: Diagnosis not present

## 2024-02-16 DIAGNOSIS — I129 Hypertensive chronic kidney disease with stage 1 through stage 4 chronic kidney disease, or unspecified chronic kidney disease: Secondary | ICD-10-CM | POA: Diagnosis not present

## 2024-02-16 DIAGNOSIS — I4891 Unspecified atrial fibrillation: Secondary | ICD-10-CM | POA: Diagnosis not present

## 2024-02-16 DIAGNOSIS — K635 Polyp of colon: Secondary | ICD-10-CM | POA: Diagnosis not present

## 2024-02-16 DIAGNOSIS — K648 Other hemorrhoids: Secondary | ICD-10-CM | POA: Diagnosis not present

## 2024-02-16 DIAGNOSIS — Z1507 Genetic susceptibility to malignant neoplasm of urinary tract: Secondary | ICD-10-CM | POA: Diagnosis not present

## 2024-02-16 DIAGNOSIS — D125 Benign neoplasm of sigmoid colon: Secondary | ICD-10-CM | POA: Diagnosis not present

## 2024-02-16 DIAGNOSIS — Z79899 Other long term (current) drug therapy: Secondary | ICD-10-CM | POA: Diagnosis not present

## 2024-02-16 DIAGNOSIS — Z950 Presence of cardiac pacemaker: Secondary | ICD-10-CM | POA: Diagnosis not present

## 2024-02-16 DIAGNOSIS — N189 Chronic kidney disease, unspecified: Secondary | ICD-10-CM | POA: Diagnosis not present

## 2024-02-16 DIAGNOSIS — Z1211 Encounter for screening for malignant neoplasm of colon: Secondary | ICD-10-CM | POA: Diagnosis not present

## 2024-02-16 DIAGNOSIS — Z980 Intestinal bypass and anastomosis status: Secondary | ICD-10-CM | POA: Diagnosis not present

## 2024-02-16 DIAGNOSIS — K3189 Other diseases of stomach and duodenum: Secondary | ICD-10-CM | POA: Diagnosis not present

## 2024-02-16 DIAGNOSIS — Z7985 Long-term (current) use of injectable non-insulin antidiabetic drugs: Secondary | ICD-10-CM | POA: Diagnosis not present

## 2024-02-16 DIAGNOSIS — Z15068 Genetic susceptibility to other malignant neoplasm of digestive system: Secondary | ICD-10-CM | POA: Diagnosis not present

## 2024-02-16 NOTE — Discharge Instructions (Signed)

## 2024-02-16 NOTE — Anesthesia Postprocedure Evaluation (Signed)
 Patient: [name]  Procedure Summary     Date: 02/16/24 Room / Location: [facility] - ENDOSCOPY   Anesthesia Start: 1143 Anesthesia Stop: 1306   Procedures:      COLONOSCOPY     ESOPHAGOGASTRODUODENOSCOPY Diagnosis: Malignant neoplasm of transverse colon    (CMD)   Scheduled Providers: Belvie Toribio Seip, MD; Rollo Evangelina Brought, CRNA Responsible Provider: Debby Oneita Rudder, MD   Anesthesia Type: general ASA Status: 3       Anesthesia Type: general  Vitals Value Taken Time  BP 111/58 02/16/24 13:27  Temp 97.9 F (36.6 C) 02/16/24 13:27  Pulse 70 02/16/24 13:28  Resp 17 02/16/24 13:28  SpO2 98 % 02/16/24 13:28  Vitals shown include unfiled device data.  There were no known notable events for this encounter.  Anesthesia Post Evaluation  Final anesthesia type: general Patient location during evaluation: GI Patient participation: Patient participated Level of consciousness: awake and alert Pain score: pain well controlled (patient comfortable/resting) Pain management: adequately controlled during entire PACU stay Post-op nausea and vomiting?: none Post-op vital signs: post-procedure vital signs are stable Patient temperature: Normothermic Cardiovascular status: hemodynamically stable Respiratory status: Stable, room air, spontaneous Hydration status: adequately hydrated Post-op disposition: Home Anesthesia post-op complications?:no complications

## 2024-02-17 ENCOUNTER — Inpatient Hospital Stay
Admission: RE | Admit: 2024-02-17 | Discharge: 2024-02-17 | Disposition: A | Source: Ambulatory Visit | Attending: Family Medicine | Admitting: Family Medicine

## 2024-02-18 ENCOUNTER — Encounter: Payer: Self-pay | Admitting: Physical Therapy

## 2024-02-18 ENCOUNTER — Ambulatory Visit: Admitting: Physical Therapy

## 2024-02-18 DIAGNOSIS — M62838 Other muscle spasm: Secondary | ICD-10-CM

## 2024-02-18 DIAGNOSIS — M542 Cervicalgia: Secondary | ICD-10-CM | POA: Insufficient documentation

## 2024-02-18 DIAGNOSIS — M6281 Muscle weakness (generalized): Secondary | ICD-10-CM | POA: Diagnosis present

## 2024-02-18 DIAGNOSIS — M5459 Other low back pain: Secondary | ICD-10-CM | POA: Insufficient documentation

## 2024-02-18 DIAGNOSIS — E291 Testicular hypofunction: Secondary | ICD-10-CM | POA: Diagnosis not present

## 2024-02-18 NOTE — Therapy (Signed)
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   GOALS: Goals reviewed with patient? Yes  SHORT TERM GOALS: Target date: 01/20/2024  Patient will be independent with initial HEP to improve outcomes and carryover.  Baseline: TBD Goal status: MET - 12/22/23  2.  Patient will report 25% improvement in neck and low back pain to improve QOL. Baseline: Neck - 4/10 on eval, up to 7/10 at worst; Low back - 2/10 on eval, up to 9/10 at worst Goal status: PARTIALLY MET - 01/20/24 - met for back, neck pain still 20%  3.  Patient to verbalize understanding of proper posture and body mechanics to achieve and maintain good spinal alignment needed for daily activities to reduce pain/muscle strain. Baseline: education initiated on eval Goal status: MET - 12/22/23    LONG TERM GOALS: Target date:  03/02/2024  Patient will be independent with ongoing/advanced HEP for self-management at home.  Baseline:  Goal status: IN PROGRESS - 01/28/24 - I w/ current HEP, continuing to modify  2.  Patient will report 50-75% improvement in neck and low back pain to improve QOL.  Baseline: Neck - 4/10 on eval, up to 7/10 at worst; Low back - 2/10 on eval, up to 9/10 at worst 01/20/24 - improving for back, neck remains the same as of now Goal status: PARTIALLY MET - 02/18/24 - 50% improvement for neck, 60% for back  3.  Patient will demonstrate improved posture to decrease muscle imbalance. Baseline:  Goal status: IN PROGRESS - 01/28/24 - stiff, rigid posture rounded shoulders   4.  Patient to demonstrate ability to achieve  and maintain good spinal alignment and body mechanics needed for daily activities. Baseline:  Goal status: MET - 02/18/24  5.  Patient will demonstrate functional pain free cervical ROM for safety with driving.  Baseline: Refer to above cervical ROM table Goal status: IN PROGRESS - 01/28/24 - stiffer across the board today  6.  Patient will demonstrate functional pain free lumbar ROM to perform ADLs.   Baseline: Refer to above lumbar ROM table Goal status: IN PROGRESS - 01/28/24 - stiffer across the board today  7.  Patient will demonstrate improved functional strength as demonstrated by improved scapular and LE strength to >/= 4+/5. Baseline: Refer to above UE/LE MMT tables 01/28/24 - LE strength improving Goal status: PARTIALLY MET - 02/18/24 - Met for scapular muscles, knees and ankles, partially met for hips   8.  Patient will report </= 12% on NDI (MCID = 10%) to demonstrate improved functional ability.  Baseline: 11 / 50 = 22.0 % Goal status: IN PROGRESS - 01/28/24 - 15 / 50 = 30.0 %  9.  Patient will report </= 35% on Modified Oswestry (MCID = 12.8%) to demonstrate improved functional ability.  Baseline: 24 / 50 = 48.0 % Goal status: IN PROGRESS - 01/20/24 - 20 /  50 = 40.0 %  10.  Patient to report ability to perform ADLs, household, and leisure-related tasks without limitation due to neck or low back pain, LOM or weakness Baseline:  01/28/24 - trouble with vacuuming Goal status: IN PROGRESS - 02/18/24 - difficulty still noted with tying shoes, vacuuming, reaching down into washer  11.  Patient will tolerate 10-20 min of standing to perform self-care and ADLs. Baseline: unable to stand to brush teeth w/o severe LBP 01/28/24 - can stand for 10 min Goal status: IN PROGRESS - 02/18/24 - can stand unsupported for ~15 minutes, longer if walking or leaning on something    PLAN:  PT FREQUENCY: 2x/week  PT DURATION: 8-12 weeks  PLANNED INTERVENTIONS: 97164- PT Re-evaluation, 97750- Physical Performance Testing, 97110-Therapeutic exercises, 97530- Therapeutic activity, 97112- Neuromuscular re-education, 97535- Self Care, 02859- Manual therapy, [id number]- Gait training, [phone]- Aquatic Therapy, [phone]- Electrical stimulation (unattended), 97035- Ultrasound, 02987- Traction (mechanical), 20560 (1-2 muscles), 20561 (3+ muscles)- Dry Needling, Patient/Family education, Balance training, Taping, Joint mobilization, Spinal mobilization, Cryotherapy, and Moist heat  PLAN FOR NEXT SESSION: postural strengthening and mobility as well as progress core/lumbopelvic flexibility and stabilization/strengthening - emphasis on glute strengthening; functional activity simulation - resisted push/pull for vacuuming, fwd T for reaching into washing machine; work on gym equipment (he has most machines at clubhouse in neighborhood)   Elijah CHRISTELLA Hidden, PT 02/18/2024, 4:51 PM  [facility] [address]  [address] [place], [place], [address] Phone: [phone]   Fax:  [phone]

## 2024-02-22 ENCOUNTER — Ambulatory Visit

## 2024-02-22 DIAGNOSIS — M5459 Other low back pain: Secondary | ICD-10-CM

## 2024-02-22 DIAGNOSIS — M6281 Muscle weakness (generalized): Secondary | ICD-10-CM

## 2024-02-22 DIAGNOSIS — M62838 Other muscle spasm: Secondary | ICD-10-CM

## 2024-02-22 DIAGNOSIS — M542 Cervicalgia: Secondary | ICD-10-CM

## 2024-02-22 NOTE — Therapy (Signed)
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 will be independent with initial HEP to improve outcomes and carryover.  Baseline: TBD Goal status: MET - 12/22/23  2.  Patient will report 25% improvement in neck and low back pain to improve QOL. Baseline: Neck - 4/10 on eval, up to 7/10 at worst; Low back - 2/10 on eval, up to 9/10 at worst Goal status: PARTIALLY MET - 01/20/24 - met for back, neck pain still 20%  3.  Patient to verbalize understanding of proper posture and body mechanics to achieve and maintain good spinal alignment needed for daily activities to reduce pain/muscle strain. Baseline: education initiated on eval Goal status: MET - 12/22/23    LONG TERM GOALS: Target date: 03/02/2024  Patient will be independent with  ongoing/advanced HEP for self-management at home.  Baseline:  Goal status: IN PROGRESS - 01/28/24 - I w/ current HEP, continuing to modify  2.  Patient will report 50-75% improvement in neck and low back pain to improve QOL.  Baseline: Neck - 4/10 on eval, up to 7/10 at worst; Low back - 2/10 on eval, up to 9/10 at worst 01/20/24 - improving for back, neck remains the same as of now Goal status: PARTIALLY MET - 02/18/24 - 50% improvement for neck, 60% for back  3.  Patient will demonstrate improved posture to decrease muscle imbalance. Baseline:  Goal status: IN PROGRESS - 01/28/24 - stiff, rigid posture rounded shoulders   4.  Patient to demonstrate ability to achieve and maintain good spinal alignment and body mechanics needed for daily activities. Baseline:  Goal status: MET - 02/18/24  5.  Patient will demonstrate functional pain free cervical ROM for safety with driving.  Baseline: Refer to above cervical ROM table Goal status: IN PROGRESS - 01/28/24 - stiffer across the board today  6.  Patient will demonstrate functional pain free lumbar ROM to perform ADLs.   Baseline: Refer to above lumbar ROM table Goal status: IN PROGRESS - 01/28/24 - stiffer across the board today  7.  Patient will demonstrate improved functional strength as demonstrated by improved scapular and LE strength to >/= 4+/5. Baseline: Refer to above UE/LE MMT tables 01/28/24 - LE strength improving Goal status: PARTIALLY MET - 02/18/24 - Met for scapular muscles, knees and ankles, partially met for hips   8.  Patient will report </= 12% on NDI (MCID = 10%) to demonstrate improved functional ability.  Baseline: 11 / 50 = 22.0 % Goal status: IN PROGRESS - 01/28/24 - 15 / 50 = 30.0 %  9.  Patient will report </= 35% on Modified Oswestry (MCID = 12.8%) to demonstrate improved functional ability.  Baseline: 24 / 50 = 48.0 % Goal status: IN PROGRESS - 01/20/24 - 20 / 50 = 40.0 %; 02/22/24-- 18 / 50 = 36.0  %  10.  Patient to report ability to perform ADLs, household, and leisure-related tasks without limitation due to neck or low back pain, LOM or weakness Baseline:  01/28/24 - trouble with vacuuming Goal status: IN PROGRESS - 02/18/24 - difficulty still noted with tying shoes, vacuuming, reaching down into washer  11.  Patient will tolerate 10-20 min of standing to perform self-care and ADLs. Baseline: unable to stand to brush teeth w/o severe LBP 01/28/24 - can stand for 10 min Goal status: IN PROGRESS - 02/18/24 - can stand unsupported for ~15 minutes, longer if walking or leaning on something    PLAN:  PT FREQUENCY: 2x/week  PT DURATION: 8-12 weeks  PLANNED INTERVENTIONS: 97164- PT Re-evaluation, 97750- Physical Performance Testing, 97110-Therapeutic exercises, 97530- Therapeutic activity, 97112- Neuromuscular re-education, 97535- Self Care, 02859- Manual therapy, [id number]- Gait training, [phone]- Aquatic Therapy, [phone]- Electrical stimulation (unattended), 97035- Ultrasound, 02987- Traction (mechanical), 20560 (1-2 muscles), 20561 (3+ muscles)- Dry Needling, Patient/Family education, Balance training, Taping, Joint mobilization, Spinal mobilization, Cryotherapy, and Moist heat  PLAN FOR NEXT SESSION: stabilization/strengthening - emphasis on glute strengthening; functional activity simulation - resisted push/pull for vacuuming, fwd T for reaching into washing machine; work on gym equipment (he has most machines at clubhouse in neighborhood)   [name], PTA 02/22/2024, 4:57 PM  [facility] [address]  [address] [place], [place], [address] Phone: [phone]   Fax:  [phone]

## 2024-02-23 NOTE — Discharge Instructions (Signed)

## 2024-02-24 ENCOUNTER — Inpatient Hospital Stay: Admission: RE | Admit: 2024-02-24 | Discharge: 2024-02-24 | Attending: Family Medicine | Admitting: Family Medicine

## 2024-02-24 DIAGNOSIS — M4727 Other spondylosis with radiculopathy, lumbosacral region: Secondary | ICD-10-CM | POA: Diagnosis not present

## 2024-02-24 DIAGNOSIS — M545 Low back pain, unspecified: Secondary | ICD-10-CM

## 2024-02-24 MED ORDER — IOPAMIDOL (ISOVUE-M 200) INJECTION 41%
1.0000 mL | Freq: Once | INTRAMUSCULAR | Status: AC
  Administered 2024-02-24: 12:00:00 1 mL via EPIDURAL

## 2024-02-24 MED ORDER — METHYLPREDNISOLONE ACETATE 40 MG/ML INJ SUSP (RADIOLOG
80.0000 mg | Freq: Once | INTRAMUSCULAR | Status: AC
  Administered 2024-02-24: 12:00:00 80 mg via EPIDURAL

## 2024-02-25 ENCOUNTER — Ambulatory Visit

## 2024-02-25 DIAGNOSIS — H5212 Myopia, left eye: Secondary | ICD-10-CM | POA: Diagnosis not present

## 2024-02-26 ENCOUNTER — Other Ambulatory Visit: Payer: Self-pay | Admitting: Cardiology

## 2024-02-26 DIAGNOSIS — I480 Paroxysmal atrial fibrillation: Secondary | ICD-10-CM

## 2024-02-29 ENCOUNTER — Ambulatory Visit: Admitting: Physical Therapy

## 2024-02-29 ENCOUNTER — Encounter: Payer: Self-pay | Admitting: Physical Therapy

## 2024-02-29 DIAGNOSIS — M62838 Other muscle spasm: Secondary | ICD-10-CM

## 2024-02-29 DIAGNOSIS — M6281 Muscle weakness (generalized): Secondary | ICD-10-CM

## 2024-02-29 DIAGNOSIS — M542 Cervicalgia: Secondary | ICD-10-CM

## 2024-02-29 DIAGNOSIS — M5459 Other low back pain: Secondary | ICD-10-CM

## 2024-02-29 NOTE — Therapy (Signed)
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 functional outcome.   REHAB POTENTIAL: Good  CLINICAL DECISION MAKING: Evolving/moderate complexity  EVALUATION COMPLEXITY: Moderate   GOALS: Goals reviewed with patient? Yes  SHORT TERM GOALS: Target date: 01/20/2024  Patient will be independent with initial HEP to improve outcomes and carryover.  Baseline: TBD Goal status: MET - 12/22/23  2.  Patient will report 25% improvement in neck and low back pain to improve QOL. Baseline: Neck - 4/10 on eval, up to 7/10 at worst; Low back - 2/10 on eval, up to 9/10 at worst 01/20/24 - met for back, neck pain still 20% 02/18/24 - 50% improvement for neck, 60% for back  Goal status: MET - 02/29/24 - 70% improvement in neck pain, 40% improvement in low back pain  3.  Patient to verbalize understanding of proper posture and  body mechanics to achieve and maintain good spinal alignment needed for daily activities to reduce pain/muscle strain. Baseline: education initiated on eval Goal status: MET - 12/22/23    LONG TERM GOALS: Target date: 03/02/2024, extended to 04/11/2024   Patient will be independent with ongoing/advanced HEP for self-management at home.  Baseline:  01/28/24 - I w/ current HEP, continuing to modify Goal status: IN PROGRESS - 02/29/24 - HEP reviewed, providing guidance on adjusting frequency of strengthening exercises to allow for muscle recovery and more manageable time  with HEP  2.  Patient will report 50-75% improvement in neck and low back pain to improve QOL.  Baseline: Neck - 4/10 on eval, up to 7/10 at worst; Low back - 2/10 on eval, up to 9/10 at worst 01/20/24 - improving for back, neck remains the same as of now 02/18/24 - 50% improvement for neck, 60% for back Goal status: PARTIALLY MET - 02/29/24 - 70% improvement in neck pain, 40% improvement in low back pain  3.  Patient will demonstrate improved posture to decrease muscle imbalance. Baseline:  01/28/24 - stiff, rigid posture rounded shoulders Goal status: IN PROGRESS - 02/29/24 - stiff/rigid neck and back, rounded shoulders improving  4.  Patient to demonstrate ability to achieve and maintain good spinal alignment and body mechanics needed for daily activities. Baseline:  Goal status: MET - 02/18/24  5.  Patient will demonstrate functional pain free cervical ROM for safety with driving.  Baseline: Refer to above cervical ROM table 01/28/24 - stiffer across the board today Goal status: IN PROGRESS - 02/29/24 - improved from baseline in all motions except flexion, but still having to rely more on trunk rotation and mirrors when driving  6.  Patient will demonstrate functional pain free lumbar ROM to perform ADLs.   Baseline: Refer to above lumbar ROM table 01/28/24 - stiffer across the board today Goal status: IN PROGRESS -  02/29/24 - overall improved but more guarded today due to muscle spasm and increased pain since yesterday  7.  Patient will demonstrate improved functional strength as demonstrated by improved scapular and LE strength to >/= 4+/5. Baseline: Refer to above UE/LE MMT tables 01/28/24 - LE strength improving Goal status: PARTIALLY MET - 02/18/24 - Met for scapular muscles, knees and ankles, partially met for hips   8.  Patient will report </= 12% on NDI (MCID = 10%) to demonstrate improved functional ability.  Baseline: 11 / 50 = 22.0 % 01/28/24 - 15 / 50 = 30.0 % Goal status: MET - 02/29/24 - 6 / 50 = 12.0 %  9.  Patient will report </= 35% on Modified Oswestry (MCID = 12.8%) to demonstrate improved functional ability.  Baseline: 24 / 50 = 48.0 % 01/20/24 - 20 / 50 = 40.0 % 02/22/24 - 18 / 50 = 36.0 % Goal status: IN PROGRESS - 02/29/24 - 22 / 50 = 44.0 % (pt experiencing flare-up of pain since yesterday)  10.  Patient to report ability to perform ADLs, household, and leisure-related tasks without limitation due to neck or low back pain, LOM or weakness Baseline:  01/28/24 - trouble with vacuuming 02/18/24 - difficulty still noted with tying shoes, vacuuming, reaching down into washer Goal status: PARTIALLY MET - 02/29/24 - improving tolerance for vacuuming, continues to rely on placing foot on lower edge of cabinet for activities requiring prolonged standing  11.  Patient will tolerate 10-20 min of standing to perform self-care and ADLs. Baseline: unable to stand to brush teeth w/o severe LBP 01/28/24 - can stand for 10 min 02/18/24 - can stand unsupported for ~15 minutes, longer if walking or leaning on something Goal status: MET - 02/29/24 - can stand unsupported for ~15 minutes if propping foot on ledge of cabinet   PLAN:  PT FREQUENCY: 1x/week  PT DURATION: 6 weeks  PLANNED INTERVENTIONS: 97164- PT Re-evaluation, 97750- Physical Performance Testing, 97110-Therapeutic exercises,  97530- Therapeutic activity, [id number]- Neuromuscular re-education, 97535- Self Care, [id number]- Manual therapy, [id number]- Gait training, [phone]- Aquatic Therapy, [id number]- Electrical stimulation (  unattended), [phone]- Ultrasound, 02987- Traction (mechanical), [phone] (1-2 muscles), 20561 (3+ muscles)- Dry Needling, Patient/Family education, Balance training, Taping, Joint mobilization, Spinal mobilization, Cryotherapy, and Moist heat  PLAN FOR NEXT SESSION: stabilization/strengthening - emphasis on glute strengthening; functional activity simulation - resisted push/pull for vacuuming, fwd T for reaching into washing machine; work on gym equipment (he has most machines at clubhouse in neighborhood)   Elijah CHRISTELLA Hidden, PT 02/29/2024, 4:35 PM  [facility] [address]  [address] [place], [place], [address] Phone: [phone]   Fax:  [phone]    "

## 2024-03-14 ENCOUNTER — Ambulatory Visit: Admitting: Physical Therapy

## 2024-04-05 ENCOUNTER — Other Ambulatory Visit: Payer: Self-pay | Admitting: Infectious Diseases

## 2024-04-05 DIAGNOSIS — B20 Human immunodeficiency virus [HIV] disease: Secondary | ICD-10-CM

## 2024-04-12 ENCOUNTER — Ambulatory Visit: Payer: BC Managed Care – PPO

## 2024-04-13 LAB — CUP PACEART REMOTE DEVICE CHECK
Battery Remaining Longevity: 75 mo
Battery Voltage: 2.98 V
Brady Statistic AP VP Percent: 0.39 %
Brady Statistic AP VS Percent: 0 %
Brady Statistic AS VP Percent: 98.46 %
Brady Statistic AS VS Percent: 1.08 %
Brady Statistic RA Percent Paced: 0.4 %
Brady Statistic RV Percent Paced: 96.58 %
Date Time Interrogation Session: 20260202222230
Implantable Lead Connection Status: 753985
Implantable Lead Connection Status: 753985
Implantable Lead Implant Date: 20230202
Implantable Lead Implant Date: 20230202
Implantable Lead Location: 753859
Implantable Lead Location: 753860
Implantable Lead Model: 3830
Implantable Lead Model: 5076
Implantable Pulse Generator Implant Date: 20230202
Lead Channel Impedance Value: 342 Ohm
Lead Channel Impedance Value: 361 Ohm
Lead Channel Impedance Value: 418 Ohm
Lead Channel Impedance Value: 437 Ohm
Lead Channel Pacing Threshold Amplitude: 0.5 V
Lead Channel Pacing Threshold Amplitude: 1.625 V
Lead Channel Pacing Threshold Pulse Width: 0.4 ms
Lead Channel Pacing Threshold Pulse Width: 0.4 ms
Lead Channel Sensing Intrinsic Amplitude: 4 mV
Lead Channel Sensing Intrinsic Amplitude: 4 mV
Lead Channel Sensing Intrinsic Amplitude: 4.75 mV
Lead Channel Sensing Intrinsic Amplitude: 4.75 mV
Lead Channel Setting Pacing Amplitude: 1.5 V
Lead Channel Setting Pacing Amplitude: 3.25 V
Lead Channel Setting Pacing Pulse Width: 0.4 ms
Lead Channel Setting Sensing Sensitivity: 1.2 mV
Zone Setting Status: 755011

## 2024-07-12 ENCOUNTER — Ambulatory Visit: Payer: BC Managed Care – PPO
